# Patient Record
Sex: Female | Born: 1990
Health system: Southern US, Community
[De-identification: ages and names within clinical notes are randomized; demographics above are authoritative.]

## PROBLEM LIST (undated history)

## (undated) DIAGNOSIS — Z8619 Personal history of other infectious and parasitic diseases: Secondary | ICD-10-CM

## (undated) DIAGNOSIS — J302 Other seasonal allergic rhinitis: Secondary | ICD-10-CM

## (undated) DIAGNOSIS — N736 Female pelvic peritoneal adhesions (postinfective): Secondary | ICD-10-CM

## (undated) DIAGNOSIS — N390 Urinary tract infection, site not specified: Secondary | ICD-10-CM

## (undated) DIAGNOSIS — K219 Gastro-esophageal reflux disease without esophagitis: Secondary | ICD-10-CM

## (undated) DIAGNOSIS — N83202 Unspecified ovarian cyst, left side: Secondary | ICD-10-CM

## (undated) DIAGNOSIS — Z720 Tobacco use: Secondary | ICD-10-CM

## (undated) DIAGNOSIS — I2699 Other pulmonary embolism without acute cor pulmonale: Secondary | ICD-10-CM

## (undated) DIAGNOSIS — L309 Dermatitis, unspecified: Secondary | ICD-10-CM

## (undated) DIAGNOSIS — J189 Pneumonia, unspecified organism: Secondary | ICD-10-CM

## (undated) DIAGNOSIS — F1010 Alcohol abuse, uncomplicated: Secondary | ICD-10-CM

## (undated) DIAGNOSIS — J4520 Mild intermittent asthma, uncomplicated: Secondary | ICD-10-CM

## (undated) DIAGNOSIS — E669 Obesity, unspecified: Secondary | ICD-10-CM

## (undated) DIAGNOSIS — A599 Trichomoniasis, unspecified: Secondary | ICD-10-CM

## (undated) DIAGNOSIS — R0600 Dyspnea, unspecified: Secondary | ICD-10-CM

## (undated) HISTORY — PX: NO PAST SURGERIES: SHX2092

## (undated) HISTORY — DX: Obesity, unspecified: E66.9

## (undated) HISTORY — PX: ABDOMINAL HYSTERECTOMY: SHX81

---
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
## 2010-01-07 ENCOUNTER — Emergency Department (HOSPITAL_COMMUNITY): Admission: EM | Admit: 2010-01-07 | Discharge: 2010-01-07 | Payer: Self-pay | Admitting: Family Medicine

## 2010-08-24 ENCOUNTER — Emergency Department (HOSPITAL_COMMUNITY)
Admission: EM | Admit: 2010-08-24 | Discharge: 2010-08-24 | Payer: Self-pay | Source: Home / Self Care | Admitting: Emergency Medicine

## 2011-05-16 ENCOUNTER — Inpatient Hospital Stay (INDEPENDENT_AMBULATORY_CARE_PROVIDER_SITE_OTHER)
Admission: RE | Admit: 2011-05-16 | Discharge: 2011-05-16 | Disposition: A | Discharge status: Home / Self Care | Payer: Self-pay | Source: Ambulatory Visit | Attending: Emergency Medicine | Admitting: Emergency Medicine

## 2011-05-16 DIAGNOSIS — J45909 Unspecified asthma, uncomplicated: Secondary | ICD-10-CM

## 2011-08-05 ENCOUNTER — Encounter: Payer: Self-pay | Admitting: Emergency Medicine

## 2011-08-05 ENCOUNTER — Emergency Department (INDEPENDENT_AMBULATORY_CARE_PROVIDER_SITE_OTHER)
Admission: EM | Admit: 2011-08-05 | Discharge: 2011-08-05 | Disposition: A | Discharge status: Home / Self Care | Payer: Self-pay | Source: Home / Self Care

## 2011-08-05 DIAGNOSIS — J309 Allergic rhinitis, unspecified: Secondary | ICD-10-CM

## 2011-08-05 DIAGNOSIS — J45909 Unspecified asthma, uncomplicated: Secondary | ICD-10-CM

## 2011-08-05 DIAGNOSIS — J302 Other seasonal allergic rhinitis: Secondary | ICD-10-CM

## 2011-08-05 MED ORDER — ALBUTEROL SULFATE HFA 108 (90 BASE) MCG/ACT IN AERS: 1.0000 | INHALATION_SPRAY | Freq: Four times a day (QID) | RESPIRATORY_TRACT | Status: DC | PRN

## 2011-08-05 MED ORDER — FEXOFENADINE-PSEUDOEPHED ER 60-120 MG PO TB12: 1.0000 | ORAL_TABLET | Freq: Two times a day (BID) | ORAL | Status: DC

## 2011-08-05 MED ORDER — ACETAMINOPHEN-CODEINE 120-12 MG/5ML PO SOLN: 5.0000 mL | Freq: Four times a day (QID) | ORAL | Status: AC | PRN

## 2011-08-05 MED ORDER — PREDNISONE 20 MG PO TABS: ORAL_TABLET | ORAL | Status: AC

## 2011-08-05 NOTE — ED Provider Notes (Signed)
History     CSN: [id number] Arrival date & time: 08/05/2011  7:52 PM   First MD Initiated Contact with Patient 08/05/11 1843      Chief Complaint  Patient presents with  . Chest Pain    (Consider location/radiation/quality/duration/timing/severity/associated sxs/prior treatment) Patient is a 20 y.o. female presenting with chest pain. The history is provided by the patient.  Chest Pain Episode onset: h/o asthma had stuffy nose for 2 weeks followed by persistent non productive cough. chest sore when coughing. Primary symptoms include cough and wheezing. Pertinent negatives for primary symptoms include no fever, no shortness of breath, no abdominal pain, no nausea and no vomiting. Primary symptoms comment: increased wheezing using albuterol inhaler 2-3 times a day.     Past Medical History  Diagnosis Date  . Asthma     History reviewed. No pertinent past surgical history.  History reviewed. No pertinent family history.  History  Substance Use Topics  . Smoking status: Current Everyday Smoker  . Smokeless tobacco: Not on file  . Alcohol Use: Yes    OB History    Grav Para Term Preterm Abortions TAB SAB Ect Mult Living                  Review of Systems  Constitutional: Negative.  Negative for fever.  HENT: Positive for congestion, rhinorrhea and sneezing. Negative for sore throat and voice change.   Eyes: Positive for itching. Negative for discharge.  Respiratory: Positive for cough and wheezing. Negative for chest tightness and shortness of breath.   Cardiovascular: Positive for chest pain. Negative for leg swelling.  Gastrointestinal: Negative for nausea, vomiting and abdominal pain.  Musculoskeletal: Negative for myalgias and arthralgias.  Skin: Negative for rash.  Neurological: Negative for headaches.    Allergies  Review of patient's allergies indicates no known allergies.  Home Medications   Current Outpatient Rx  Name Route Sig Dispense Refill  .  ALBUTEROL SULFATE HFA 108 (90 BASE) MCG/ACT IN AERS Inhalation Inhale 2 puffs into the lungs every 6 (six) hours as needed.      . ACETAMINOPHEN-CODEINE 120-12 MG/5ML PO SOLN Oral Take 5 mLs by mouth every 6 (six) hours as needed for pain. 120 mL 0  . ALBUTEROL SULFATE HFA 108 (90 BASE) MCG/ACT IN AERS Inhalation Inhale 1-2 puffs into the lungs every 6 (six) hours as needed for wheezing. 1 Inhaler 0  . FEXOFENADINE-PSEUDOEPHEDRINE 60-120 MG PO TB12 Oral Take 1 tablet by mouth every 12 (twelve) hours. 30 tablet 0  . PREDNISONE 20 MG PO TABS  2 tabs po daily for 5 days 10 tablet no    BP 98/73  Pulse 74  Temp(Src) 98.7 F (37.1 C) (Oral)  Resp 18  SpO2 99%  LMP 07/20/2011  Physical Exam  Nursing note and vitals reviewed. Constitutional: She is oriented to person, place, and time. She appears well-developed and well-nourished. No distress.  HENT:  Head: Normocephalic and atraumatic.  Right Ear: External ear normal.  Left Ear: External ear normal.       Swelling and erythema of nasal turbinates with clear rhinorrhea. Post nasal drip with pharyngeal erythema no exudates.   Eyes: Pupils are equal, round, and reactive to light.       Bilateral eye tearing. No exudative discharge. No blepharitis. Mild conjunctival erythema and cobblestone appearance at inner conjunctiva.    Neck: Normal range of motion.  Cardiovascular: Normal rate, regular rhythm and normal heart sounds.   Pulmonary/Chest: Breath sounds normal. She has  no wheezes. She has no rales.  Abdominal: Soft. There is no tenderness.  Lymphadenopathy:    She has no cervical adenopathy.  Neurological: She is alert and oriented to person, place, and time.  Skin: No rash noted.    ED Course  Procedures (including critical care time)  Labs Reviewed - No data to display No results found.   1. Asthma   2. Seasonal allergic rhinitis       MDM  Treated with prednisone, antihistamine with a decongestant, codeine syrup and  refilled albuterol.        Sharin Grave, MD 08/07/11 1038

## 2011-08-05 NOTE — ED Notes (Signed)
C/o chest pain onset this am .  Reports chest soreness upper chest, center - left.  Pain is worse with movement and coughing.  Non productive cough.  Stuffy nose for 2 weeks.

## 2011-11-28 ENCOUNTER — Emergency Department (HOSPITAL_COMMUNITY)
Admission: EM | Admit: 2011-11-28 | Discharge: 2011-11-29 | Disposition: A | Discharge status: Home / Self Care | Payer: Self-pay | Attending: Emergency Medicine | Admitting: Emergency Medicine

## 2011-11-28 ENCOUNTER — Encounter (HOSPITAL_COMMUNITY): Payer: Self-pay | Admitting: *Deleted

## 2011-11-28 ENCOUNTER — Emergency Department (HOSPITAL_COMMUNITY): Payer: Self-pay

## 2011-11-28 ENCOUNTER — Other Ambulatory Visit: Payer: Self-pay

## 2011-11-28 DIAGNOSIS — R000 Tachycardia, unspecified: Secondary | ICD-10-CM | POA: Insufficient documentation

## 2011-11-28 DIAGNOSIS — J45909 Unspecified asthma, uncomplicated: Secondary | ICD-10-CM | POA: Insufficient documentation

## 2011-11-28 DIAGNOSIS — R079 Chest pain, unspecified: Secondary | ICD-10-CM | POA: Insufficient documentation

## 2011-11-28 DIAGNOSIS — B349 Viral infection, unspecified: Secondary | ICD-10-CM

## 2011-11-28 DIAGNOSIS — B9789 Other viral agents as the cause of diseases classified elsewhere: Secondary | ICD-10-CM | POA: Insufficient documentation

## 2011-11-28 DIAGNOSIS — R0602 Shortness of breath: Secondary | ICD-10-CM | POA: Insufficient documentation

## 2011-11-28 DIAGNOSIS — R509 Fever, unspecified: Secondary | ICD-10-CM | POA: Insufficient documentation

## 2011-11-28 DIAGNOSIS — N390 Urinary tract infection, site not specified: Secondary | ICD-10-CM | POA: Insufficient documentation

## 2011-11-28 LAB — URINE MICROSCOPIC-ADD ON

## 2011-11-28 LAB — URINALYSIS, ROUTINE W REFLEX MICROSCOPIC
Bilirubin Urine: NEGATIVE
Glucose, UA: NEGATIVE mg/dL
Hgb urine dipstick: NEGATIVE
Ketones, ur: NEGATIVE mg/dL
Nitrite: NEGATIVE
Protein, ur: NEGATIVE mg/dL
Specific Gravity, Urine: 1.014 (ref 1.005–1.030)
Urobilinogen, UA: 1 mg/dL (ref 0.0–1.0)
pH: 6.5 (ref 5.0–8.0)

## 2011-11-28 MED ORDER — NITROFURANTOIN MONOHYD MACRO 100 MG PO CAPS: 100.0000 mg | ORAL_CAPSULE | Freq: Two times a day (BID) | ORAL | Status: AC

## 2011-11-28 MED ORDER — ALBUTEROL SULFATE (5 MG/ML) 0.5% IN NEBU
5.0000 mg | INHALATION_SOLUTION | Freq: Once | RESPIRATORY_TRACT | Status: AC
  Administered 2011-11-28: 5 mg via RESPIRATORY_TRACT
  Filled 2011-11-28: qty 1

## 2011-11-28 MED ORDER — PREDNISONE 20 MG PO TABS: 40.0000 mg | ORAL_TABLET | Freq: Every day | ORAL | Status: AC

## 2011-11-28 MED ORDER — PREDNISONE 20 MG PO TABS
60.0000 mg | ORAL_TABLET | Freq: Once | ORAL | Status: AC
  Administered 2011-11-28: 60 mg via ORAL
  Filled 2011-11-28: qty 3

## 2011-11-28 MED ORDER — HYDROCODONE-ACETAMINOPHEN 5-325 MG PO TABS
1.0000 | ORAL_TABLET | Freq: Once | ORAL | Status: AC
  Administered 2011-11-28: 1 via ORAL
  Filled 2011-11-28: qty 1

## 2011-11-28 MED ORDER — ACETAMINOPHEN 325 MG PO TABS
650.0000 mg | ORAL_TABLET | Freq: Once | ORAL | Status: AC
  Administered 2011-11-28: 650 mg via ORAL
  Filled 2011-11-28: qty 2

## 2011-11-28 MED ORDER — ALBUTEROL SULFATE HFA 108 (90 BASE) MCG/ACT IN AERS
2.0000 | INHALATION_SPRAY | RESPIRATORY_TRACT | Status: DC | PRN
  Administered 2011-11-28: 2 via RESPIRATORY_TRACT
  Filled 2011-11-28: qty 6.7

## 2011-11-28 MED ORDER — IBUPROFEN 800 MG PO TABS
800.0000 mg | ORAL_TABLET | Freq: Once | ORAL | Status: AC
  Administered 2011-11-28: 800 mg via ORAL
  Filled 2011-11-28: qty 1

## 2011-11-28 MED ORDER — IPRATROPIUM BROMIDE 0.02 % IN SOLN
0.5000 mg | Freq: Once | RESPIRATORY_TRACT | Status: AC
  Administered 2011-11-28: 0.5 mg via RESPIRATORY_TRACT
  Filled 2011-11-28: qty 2.5

## 2011-11-28 NOTE — ED Notes (Signed)
The pt has ha cold and she has difficulty breathing coughing and she has had a temp.  Hx of asthma

## 2011-11-28 NOTE — ED Provider Notes (Signed)
History     CSN: [id number]  Arrival date & time 11/28/11  4098   First MD Initiated Contact with Patient 11/28/11 2122      Chief Complaint  Patient presents with  . Shortness of Breath    (Consider location/radiation/quality/duration/timing/severity/associated sxs/prior treatment) Patient is a 21 y.o. female presenting with shortness of breath. The history is provided by the patient.  Shortness of Breath  The current episode started today. The problem occurs frequently. The problem has been gradually worsening. The problem is mild. Associated symptoms include a fever, rhinorrhea, cough, shortness of breath and wheezing. Pertinent negatives include no chest pain, no chest pressure and no sore throat. She has had no prior steroid use. There were no sick contacts.  Patient reports 2 day history of URI type  symptoms. States that yesterday she noticed she was having to use her inhaler more often for shortness of breath and wheezing. Patient has also had intermittent fever, occasional cough, body aches and urinary frequency. States that today she also took some homeopathic and reports only minimal relief of shortness of breath with nebs and/or inhaler. Patient admits to history of asthma.  Past Medical History  Diagnosis Date  . Asthma     History reviewed. No pertinent past surgical history.  History reviewed. No pertinent family history.  History  Substance Use Topics  . Smoking status: Current Everyday Smoker  . Smokeless tobacco: Not on file  . Alcohol Use: Yes    OB History    Grav Para Term Preterm Abortions TAB SAB Ect Mult Living                  Review of Systems  Constitutional: Positive for fever.  HENT: Positive for rhinorrhea. Negative for sore throat.   Eyes: Negative.   Respiratory: Positive for cough, shortness of breath and wheezing.   Cardiovascular: Negative.  Negative for chest pain.  Gastrointestinal: Negative.   Genitourinary: Positive for  frequency.  Musculoskeletal: Negative.   Skin: Negative.   Neurological: Negative.   Hematological: Negative.   Psychiatric/Behavioral: Negative.     Allergies  Review of patient's allergies indicates no known allergies.  Home Medications   Current Outpatient Rx  Name Route Sig Dispense Refill  . ALBUTEROL SULFATE HFA 108 (90 BASE) MCG/ACT IN AERS Inhalation Inhale 2 puffs into the lungs every 6 (six) hours as needed. For shortness of breath    . ALBUTEROL SULFATE (2.5 MG/3ML) 0.083% IN NEBU Nebulization Take 2.5 mg by nebulization every 6 (six) hours as needed. For shortness of breath    . IBUPROFEN 200 MG PO TABS Oral Take 400 mg by mouth every 6 (six) hours as needed. For pain.    Marland Kitchen NITROFURANTOIN MONOHYD MACRO 100 MG PO CAPS Oral Take 1 capsule (100 mg total) by mouth 2 (two) times daily. 14 capsule 0  . PREDNISONE 20 MG PO TABS Oral Take 2 tablets (40 mg total) by mouth daily. 10 tablet 0    BP 114/70  Pulse 120  Temp(Src) 99.4 F (37.4 C) (Oral)  Resp 16  SpO2 99%  LMP 11/13/2011  Physical Exam  Constitutional: She is oriented to person, place, and time. She appears well-developed and well-nourished.  HENT:  Head: Normocephalic and atraumatic.  Eyes: Conjunctivae are normal.  Neck: Neck supple.  Cardiovascular: Regular rhythm.  Tachycardia present.   Pulmonary/Chest: Effort normal and breath sounds normal.       Diminished, bil scattered insp/exp wheezes.   Abdominal: Soft. Bowel sounds  are normal.  Musculoskeletal: Normal range of motion.  Neurological: She is alert and oriented to person, place, and time.  Skin: Skin is warm and dry. No erythema.  Psychiatric: She has a normal mood and affect.    ED Course  Procedures  Patient reports breathing much improved after albuterol/Atrovent neb. BBS now CTA. Findings and clinical impression discussed with patient. Will plan to discharge home with treatment for asthmatic bronchitis and UTI and encourage follow up with  primary care physician. Referrals provided. Patient is agreeable with plan.  Labs Reviewed  URINALYSIS, ROUTINE W REFLEX MICROSCOPIC - Abnormal; Notable for the following:    APPearance CLOUDY (*)    Leukocytes, UA TRACE (*)    All other components within normal limits  URINE MICROSCOPIC-ADD ON - Abnormal; Notable for the following:    Squamous Epithelial / LPF FEW (*)    Bacteria, UA FEW (*)    All other components within normal limits   Dg Chest 2 View  11/28/2011  *RADIOLOGY REPORT*  Clinical Data: Chest pain, shortness of breath  CHEST - 2 VIEW  Comparison: 08/24/2010  Findings: Cardiomediastinal silhouette is stable.  No acute infiltrate or pleural effusion.  No pulmonary edema. Mild perihilar increased bronchial markings suspicious for bronchitic changes. Bony thorax is stable.  IMPRESSION: No acute infiltrate or pulmonary edema.  Mild perihilar increased bronchial markings suspicious for bronchitic changes.  Original Report Authenticated By: Natasha Mead, M.D.     1. Asthmatic bronchitis   2. Viral syndrome   3. Urinary tract infection       MDM  HPI/PE and clinical findings course c/w 1. asthmatic bronchitis (CXR supports, no PNA, respiratory symptoms improved with neb. Tachycardia persist but likely due to fever and beta-agonist. Patient is now vomiting. Nontoxic in appearance). 2. UTI (U/A marginal but will treat since patient is symptomatic). 3. Viral syndrome (fever responding to Tylenol and ibuprofen)        Leanne Chang, NP 11/29/11 0005  Kara Kayser Teleah Villamar, NP 11/29/11 0005

## 2011-11-28 NOTE — ED Notes (Signed)
Family in waiting room wanting to go back with patient; informed patient that she can wait in the waiting room with her family (if preferred).

## 2011-11-28 NOTE — Discharge Instructions (Signed)
Please read over the instructions below. You were treated tonight for your persistent cough and viral-type symptoms. Your chest x-ray showed changes consistent with bronchitis but no pneumonia. We are treating you for asthmatic bronchitis. Bronchitis is almost always a viral process likely a result of your recent cold symptoms. Take the prednisone as directed and be sure to complete. Use your inhaler, 1 to 2 inhalations every 4-6 hours as needed for cough, shortness of breath and or wheezing. Your urine also shows that you have a mild urinary tract infection. Take the antibiotic as directed and be sure to finish. Ideally you should have your urine rechecked in a week to 10 days to assure that the infection has cleared. Return for worsening symptoms otherwise arrange followup with her primary care physician. Referrals provided.       Urinary Tract Infection A urinary tract infection (UTI) is often caused by a germ (bacteria). A UTI is usually helped with medicine (antibiotics) that kills germs. Take all the medicine until it is gone. Do this even if you are feeling better. You are usually better in 7 to 10 days. HOME CARE   Drink enough water and fluids to keep your pee (urine) clear or pale yellow. Drink:   Cranberry juice.   Water.   Avoid:   Caffeine.   Tea.   Bubbly (carbonated) drinks.   Alcohol.   Only take medicine as told by your doctor.   To prevent further infections:   Pee often.   After pooping (bowel movement), women should wipe from front to back. Use each tissue only once.   Pee before and after having sex (intercourse).  Ask your doctor when your test results will be ready. Make sure you follow up and get your test results.  GET HELP RIGHT AWAY IF:   There is very bad back pain or lower belly (abdominal) pain.   You get the chills.   You have a fever.   Your baby is older than 3 months with a rectal temperature of 102 F (38.9 C) or higher.   Your baby  is 71 months old or younger with a rectal temperature of 100.4 F (38 C) or higher.   You feel sick to your stomach (nauseous) or throw up (vomit).   There is continued burning with peeing.   Your problems are not better in 3 days. Return sooner if you are getting worse.  MAKE SURE YOU:   Understand these instructions.   Will watch your condition.   Will get help right away if you are not doing well or get worse.  Document Released: 02/18/2008 Document Revised: 08/21/2011 Document Reviewed: 02/18/2008 [facility] Patient Information 2012 [place], [place].Viral Syndrome You or your child has Viral Syndrome. It is the most common infection causing "colds" and infections in the nose, throat, sinuses, and breathing tubes. Sometimes the infection causes nausea, vomiting, or diarrhea. The germ that causes the infection is a virus. No antibiotic or other medicine will kill it. There are medicines that you can take to make you or your child more comfortable.  HOME CARE INSTRUCTIONS   Rest in bed until you start to feel better.   If you have diarrhea or vomiting, eat small amounts of crackers and toast. Soup is helpful.   Do not give aspirin or medicine that contains aspirin to children.   Only take over-the-counter or prescription medicines for pain, discomfort, or fever as directed by your caregiver.  SEEK IMMEDIATE MEDICAL CARE IF:  You or your child has not improved within one week.   You or your child has pain that is not at least partially relieved by over-the-counter medicine.   Thick, colored mucus or blood is coughed up.   Discharge from the nose becomes thick yellow or green.   Diarrhea or vomiting gets worse.   There is any major change in your or your child's condition.   You or your child develops a skin rash, stiff neck, severe headache, or are unable to hold down food or fluid.   You or your child has an oral temperature above 102 F (38.9 C), not controlled by medicine.    Your baby is older than 3 months with a rectal temperature of 102 F (38.9 C) or higher.   Your baby is 31 months old or younger with a rectal temperature of 100.4 F (38 C) or higher.  Document Released: 08/17/2006 Document Revised: 08/21/2011 Document Reviewed: 08/18/2007 [facility] Patient Information 2012 [place], [place].Using Your Inhaler 1. Take the cap off the mouthpiece.  2. Shake the inhaler for 5 seconds.  3. Turn the inhaler so the bottle is above the mouthpiece. Hold it away from your mouth, at a distance of the width of 2 fingers.  4. Open your mouth widely, and tilt your head back slightly. Let your breath out.  5. Take a deep breath in slowly through your mouth. At the same time, push down on the bottle 1 time. You will feel the medicine enter your mouth and throat as you breathe.  6. Continue to take a deep breath in very slowly.  7. After you have breathed in completely, hold your breath for 10 seconds. This will help the medicine to settle in your lungs. If you cannot hold your breath for 10 seconds, hold it for as long as you can before you breathe out.  8. If your doctor has told you to take more than 1 puff, wait at least 1 minute between puffs. This will help you get the best results from your medicine.  9. If you use a steroid inhaler, rinse out your mouth after each dose.  10. Wash your inhaler once a day. Remove the bottle from the mouthpiece. Rinse the mouthpiece and cap with warm water. Dry everything well before you put the inhaler back together.  Document Released: 06/10/2008 Document Revised: 08/21/2011 Document Reviewed: 06/19/2009 [facility] Patient Information 2012 [place], [place].Asthma FAQ Asthma is a serious condition that causes breathing problems. Some severe attacks can cause death. Wear a bracelet or chain that lets others know you have asthma. HOW DID I GET ASTHMA? You might have been born with asthma, or you might be allergic to something that causes an  asthma attack. WHAT CAUSES AN ASTHMA ATTACK? There are many things that can cause an asthma attack. Some of the most common causes are:  Grass, weed, or tree pollen in the air.   Air pollution.   Dust.   Heavy or hard exercise.   Emotional upset.   Infections.   Smoking and secondhand smoke.   Some medicines like aspirin.   Allergies to:   Animals such as cats, dogs, or rabbits.   Certain foods like wheat, rye, nuts, or shellfish.  HOW DO I KEEP FROM HAVING AN ATTACK?  Refill your medicines before they run out.   Always take your medicine like the doctor tells you. This will help prevent an asthma attack.   If you use an inhaler or diskus, carry it  with you at all times.   Stay indoors as much as possible on ozone alert days. This is when the weather is very cold and when the pollen count is high.   Talk to your nurse or doctor about relaxation techniques that might help.   Stop smoking and stay away from smoke.  WHAT HAPPENS DURING AN ASTHMA ATTACK? The airways in your lungs get smaller and puff up (swell). You:  Have a hard time breathing.   Wheeze.   Cough and produce a lot of mucus.  HOW DO I STOP AN ATTACK?  Take medication as directed by your doctor.   If your medicine is not helping, call your local emergency medical services, and go to the emergency room.  Document Released: 06/10/2008 Document Revised: 08/21/2011 Document Reviewed: 06/10/2008 [facility] Patient Information 2012 [place], [place].Asthma, Adult Asthma is caused by narrowing of the air passages in the lungs. It may be triggered by pollen, dust, animal dander, molds, some foods, respiratory infections, exposure to smoke, exercise, emotional stress or other allergens (things that cause allergic reactions or allergies). Repeat attacks are common. HOME CARE INSTRUCTIONS   Use prescription medications as ordered by your caregiver.   Avoid pollen, dust, animal dander, molds, smoke and other  things that cause attacks at home and at work.   You may have fewer attacks if you decrease dust in your home. Electrostatic air cleaners may help.   It may help to replace your pillows or mattress with materials less likely to cause allergies.   Talk to your caregiver about an action plan for managing asthma attacks at home, including, the use of a peak flow meter which measures the severity of your asthma attack. An action plan can help minimize or stop the attack without having to seek medical care.   If you are not on a fluid restriction, drink 8 to 10 glasses of water each day.   Always have a plan prepared for seeking medical attention, including, calling your physician, accessing local emergency care, and calling 911 (in the [place] for a severe attack.   Discuss possible exercise routines with your caregiver.   If animal dander is the cause of asthma, you may need to get rid of pets.  SEEK MEDICAL CARE IF:   You have wheezing and shortness of breath even if taking medicine to prevent attacks.   You have muscle aches, chest pain or thickening of sputum.   Your sputum changes from clear or white to yellow, green, gray, or bloody.   You have any problems that may be related to the medicine you are taking (such as a rash, itching, swelling or trouble breathing).  SEEK IMMEDIATE MEDICAL CARE IF:   Your usual medicines do not stop your wheezing or there is increased coughing and/or shortness of breath.   You have increased difficulty breathing.   You have a fever.  MAKE SURE YOU:   Understand these instructions.   Will watch your condition.   Will get help right away if you are not doing well or get worse.  Document Released: 09/01/2005 Document Revised: 08/21/2011 Document Reviewed: 04/19/2008  RESOURCE GUIDE  Dental Problems  Patients with Medicaid: [facility] [phone] [address]  [address] Phone:  [phone]                                                  Phone:  [phone]  If unable to pay or uninsured, contact:  [facility] or [facility]. to become qualified for the adult dental clinic.  Chronic Pain Problems Contact [facility]  [phone] Patients need to be referred by their primary care doctor.  Insufficient Money for Medicine Contact [employer]:  call "211" or [facility] [phone] 016-0829.  No Primary Care Doctor Call [facility]  [phone] Other agencies that provide inexpensive medical care    [facility]  [phone]    [facility]  [phone]    [facility]  [phone]    [facility]  [phone]    [facility]  [phone]    [facility]  [phone]  Psychological [facility] Behavioral [facility]  [phone] [facility]  [phone] [facility] Mental Health   [phone] (emergency services [phone])  Substance Abuse Resources [facility]  (615) [phone] [facility] 9514224768 [facility] [place] [phone] Floydene Flock [phone] Residential & Outpatient Substance Abuse Program  [phone]  Abuse/Neglect [facility] Child Abuse Hotline 207-555-0850 [facility] Child Abuse Hotline 418-037-1307 (After Hours)  Emergency Shelter [facility] [phone]  Maternity Homes [facility] [place] [facility] [phone] [facility] [phone]  MRSA Hotline #:   (905) 006-5786    [facility] Resources  [facility]     [employer]                          [facility] Dept. [address] [facility]                       [address]      [address]  Blondell Reveal Phone:  [phone]                                    Phone:  [phone]                 Phone:  [phone]  [facility] Mental Health Phone:  [phone]  [facility] Child Abuse Hotline [phone] (After Hours)

## 2011-11-29 NOTE — ED Provider Notes (Signed)
Medical screening examination/treatment/procedure(s) were performed by non-physician practitioner and as supervising physician I was immediately available for consultation/collaboration.    Nelia Shi, MD 11/29/11 1010

## 2012-05-03 ENCOUNTER — Ambulatory Visit: Payer: Self-pay | Admitting: Emergency Medicine

## 2012-05-03 VITALS — BP 122/72 | HR 69 | Temp 98.1°F | Resp 16 | Ht 61.5 in | Wt 232.6 lb

## 2012-05-03 DIAGNOSIS — M6289 Other specified disorders of muscle: Secondary | ICD-10-CM

## 2012-05-03 DIAGNOSIS — R600 Localized edema: Secondary | ICD-10-CM

## 2012-05-03 DIAGNOSIS — E669 Obesity, unspecified: Secondary | ICD-10-CM

## 2012-05-03 DIAGNOSIS — K5900 Constipation, unspecified: Secondary | ICD-10-CM

## 2012-05-03 MED ORDER — POLYETHYLENE GLYCOL 3350 17 GM/SCOOP PO POWD: 17.0000 g | Freq: Two times a day (BID) | ORAL | Status: AC | PRN

## 2012-05-03 NOTE — Progress Notes (Signed)
   Date:  05/03/2012   Name:  Kara Haynes   DOB:  1991-01-19   MRN:  [id number] Gender: female  Age: 21 y.o.  PCP:  No primary provider on file.    Chief Complaint: Leg Swelling, Foot Swelling and Abdominal Pain   History of Present Illness:  Kara Haynes is a 21 y.o. pleasant patient who presents with the following:  Multiple complaints.  Has history of colicky abdominal pain. Mostly right and left upper quadrants.  No associated nausea, vomiting, diarrhea, stool change, blood in stools or black stools.  No fever or chills.  Says moves bowels frequently.  Has early satiety.  No GYN or GU symptoms. 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  No risks for DVT.   Concerned about her weight and we discussed weight loss strategies and risks of HBP, NIDDM, cholesterol and Knee/back and hip disorders.  There is no problem list on file for this patient.   Past Medical History  Diagnosis Date  . Asthma     No past surgical history on file.  History  Substance Use Topics  . Smoking status: Current Everyday Smoker  . Smokeless tobacco: Not on file  . Alcohol Use: Yes    No family history on file.  No Known Allergies  Medication list has been reviewed and updated.  Current Outpatient Prescriptions on File Prior to Visit  Medication Sig Dispense Refill  . albuterol (PROVENTIL HFA;VENTOLIN HFA) 108 (90 BASE) MCG/ACT inhaler Inhale 2 puffs into the lungs every 6 (six) hours as needed. For shortness of breath      . albuterol (PROVENTIL) (2.5 MG/3ML) 0.083% nebulizer solution Take 2.5 mg by nebulization every 6 (six) hours as needed. For shortness of breath      . ibuprofen (ADVIL,MOTRIN) 200 MG tablet Take 400 mg by mouth every 6 (six) hours as needed. For pain.        Review of Systems:  As per HPI, otherwise negative.    Physical Examination: Filed Vitals:   05/03/12 1134  BP: 122/72  Pulse: 69    Temp: 98.1 F (36.7 C)  Resp: 16   Filed Vitals:   05/03/12 1134  Height: 5' 1.5" (1.562 m)  Weight: 232 lb 9.6 oz (105.507 kg)   Body mass index is 43.24 kg/(m^2). Ideal Body Weight: Weight in (lb) to have BMI = 25: 134.2    GEN: WDWN, NAD, Non-toxic, Alert & Oriented x 3 HEENT: Atraumatic, Normocephalic.  Ears and Nose: No external deformity. EXTR: No clubbing/cyanosis/edema NEURO: Normal gait.  PSYCH: Normally interactive. Conversant. Not depressed or anxious appearing.  Calm demeanor.  Abdomen: soft and not tender no masses or megaly  Assessment and Plan: Obesity Dependent edema;  Discussed moving around and use of support hose at work Abdominal pain:  Likely secondary to constipation.  Miralax Follow up as needed  Carmelina Dane, MD

## 2012-10-25 ENCOUNTER — Encounter (HOSPITAL_COMMUNITY): Payer: Self-pay

## 2012-10-25 ENCOUNTER — Emergency Department (INDEPENDENT_AMBULATORY_CARE_PROVIDER_SITE_OTHER)
Admission: EM | Admit: 2012-10-25 | Discharge: 2012-10-25 | Disposition: A | Discharge status: Home / Self Care | Payer: Self-pay | Source: Home / Self Care

## 2012-10-25 DIAGNOSIS — J45909 Unspecified asthma, uncomplicated: Secondary | ICD-10-CM

## 2012-10-25 DIAGNOSIS — J4540 Moderate persistent asthma, uncomplicated: Secondary | ICD-10-CM

## 2012-10-25 DIAGNOSIS — J45901 Unspecified asthma with (acute) exacerbation: Secondary | ICD-10-CM

## 2012-10-25 MED ORDER — IPRATROPIUM BROMIDE 0.02 % IN SOLN
0.5000 mg | Freq: Once | RESPIRATORY_TRACT | Status: AC
  Administered 2012-10-25: 0.5 mg via RESPIRATORY_TRACT

## 2012-10-25 MED ORDER — ALBUTEROL SULFATE (5 MG/ML) 0.5% IN NEBU
INHALATION_SOLUTION | RESPIRATORY_TRACT | Status: AC
  Filled 2012-10-25: qty 1

## 2012-10-25 MED ORDER — ALBUTEROL SULFATE (5 MG/ML) 0.5% IN NEBU
5.0000 mg | INHALATION_SOLUTION | Freq: Once | RESPIRATORY_TRACT | Status: AC
  Administered 2012-10-25: 5 mg via RESPIRATORY_TRACT

## 2012-10-25 MED ORDER — ALBUTEROL SULFATE HFA 108 (90 BASE) MCG/ACT IN AERS: 2.0000 | INHALATION_SPRAY | RESPIRATORY_TRACT | Status: DC | PRN

## 2012-10-25 MED ORDER — TRIAMCINOLONE ACETONIDE 40 MG/ML IJ SUSP
INTRAMUSCULAR | Status: AC
  Filled 2012-10-25: qty 5

## 2012-10-25 MED ORDER — METHYLPREDNISOLONE 4 MG PO KIT: PACK | ORAL | Status: DC

## 2012-10-25 MED ORDER — BECLOMETHASONE DIPROPIONATE 40 MCG/ACT IN AERS: 2.0000 | INHALATION_SPRAY | Freq: Two times a day (BID) | RESPIRATORY_TRACT | Status: DC

## 2012-10-25 MED ORDER — TRIAMCINOLONE ACETONIDE 40 MG/ML IJ SUSP: 40.0000 mg | Freq: Once | INTRAMUSCULAR | Status: DC

## 2012-10-25 NOTE — ED Provider Notes (Signed)
History     CSN: [id number]  Arrival date & time 10/25/12  1015   None     Chief Complaint  Patient presents with  . Asthma    (Consider location/radiation/quality/duration/timing/severity/associated sxs/prior treatment) HPI Comments: 22 year old obese female states that she could not breathe this morning and she is gasping for air. She has a history of asthma and uses her albuterol HFA on a daily basis. She tried to use the inhaler today the she was out of medicine. She states she does not have a physician managing her asthma. Denies chest pain earache, sore throat, fever, chills, abdominal pain, GI symptoms.   Past Medical History  Diagnosis Date  . Asthma     History reviewed. No pertinent past surgical history.  History reviewed. No pertinent family history.  History  Substance Use Topics  . Smoking status: Current Every Day Smoker  . Smokeless tobacco: Not on file  . Alcohol Use: Yes    OB History   Grav Para Term Preterm Abortions TAB SAB Ect Mult Living                  Review of Systems  Constitutional: Positive for activity change. Negative for fever, chills, appetite change and fatigue.  HENT: Negative for congestion, facial swelling, rhinorrhea, neck pain, neck stiffness and postnasal drip.   Eyes: Negative.   Respiratory: Positive for shortness of breath and wheezing. Negative for cough.   Cardiovascular: Negative.   Gastrointestinal: Negative.   Genitourinary: Negative.   Musculoskeletal: Negative.   Skin: Negative for pallor and rash.  Neurological: Negative.     Allergies  Review of patient's allergies indicates no known allergies.  Home Medications   Current Outpatient Rx  Name  Route  Sig  Dispense  Refill  . albuterol (PROVENTIL HFA;VENTOLIN HFA) 108 (90 BASE) MCG/ACT inhaler   Inhalation   Inhale 2 puffs into the lungs every 6 (six) hours as needed. For shortness of breath         . albuterol (PROVENTIL HFA;VENTOLIN HFA) 108 (90  BASE) MCG/ACT inhaler   Inhalation   Inhale 2 puffs into the lungs every 4 (four) hours as needed for wheezing or shortness of breath.   1 Inhaler   1   . albuterol (PROVENTIL) (2.5 MG/3ML) 0.083% nebulizer solution   Nebulization   Take 2.5 mg by nebulization every 6 (six) hours as needed. For shortness of breath         . beclomethasone (QVAR) 40 MCG/ACT inhaler   Inhalation   Inhale 2 puffs into the lungs 2 (two) times daily.   1 Inhaler   1   . ibuprofen (ADVIL,MOTRIN) 200 MG tablet   Oral   Take 400 mg by mouth every 6 (six) hours as needed. For pain.         . methylPREDNISolone (MEDROL DOSEPAK) 4 MG tablet      As directed   21 tablet   0     BP 124/79  Pulse 63  Temp(Src) 98.3 F (36.8 C) (Oral)  Resp 18  SpO2 100%  LMP 09/24/2012  Physical Exam  Nursing note and vitals reviewed. Constitutional: She is oriented to person, place, and time. She appears well-developed and well-nourished. No distress.  HENT:  Mouth/Throat: Oropharynx is clear and moist. No oropharyngeal exudate.  Eyes: Conjunctivae and EOM are normal.  Neck: Normal range of motion. Neck supple.  Cardiovascular: Normal rate, regular rhythm and normal heart sounds.   Pulmonary/Chest: Effort normal. No  respiratory distress. She has wheezes.  There is no evidence of her gasping for her breath. She is sitting on the table in a calm and relaxed posturing. No apparent increased effort in breathing she speaking in full sentences and breathing through her nose. There is bilateral  expiratory wheezing. Slightly diminished expansion and prolonged expiratory phase.  Musculoskeletal: Normal range of motion. She exhibits no edema.  Lymphadenopathy:    She has no cervical adenopathy.  Neurological: She is alert and oriented to person, place, and time.  Skin: Skin is warm and dry. No rash noted.  Psychiatric: She has a normal mood and affect.    ED Course  Procedures (including critical care  time)  Labs Reviewed - No data to display No results found.   1. Intrinsic asthma with exacerbation   2. Asthma, moderate persistent, poorly-controlled       MDM  Patient received a duo neb in the office. Post neb her lungs are clear, no adventitious sounds no wheezing. Chest good expansion inspiratory equals expiratory phase. He states she is feeling better and breathing better She declined to receive her IM Kenalog. Prescriptions include Medrol Dosepak Albuterol HFA Qvar 44 2 puffs twice a day Musculoskeletal pain a PCP to manage her asthma. Recheck promptly for any new symptoms problems or worsening        Hayden Rasmussen, NP 10/25/12 1217

## 2012-10-25 NOTE — ED Provider Notes (Signed)
Medical screening examination/treatment/procedure(s) were performed by non-physician practitioner and as supervising physician I was immediately available for consultation/collaboration.  Leslee Home, M.D.  Reuben Likes, MD 10/25/12 [phone]

## 2013-05-20 ENCOUNTER — Encounter (HOSPITAL_COMMUNITY): Payer: Self-pay | Admitting: Emergency Medicine

## 2013-05-20 ENCOUNTER — Emergency Department (INDEPENDENT_AMBULATORY_CARE_PROVIDER_SITE_OTHER)
Admission: EM | Admit: 2013-05-20 | Discharge: 2013-05-20 | Disposition: A | Discharge status: Home / Self Care | Payer: Self-pay | Source: Home / Self Care

## 2013-05-20 DIAGNOSIS — J4531 Mild persistent asthma with (acute) exacerbation: Secondary | ICD-10-CM

## 2013-05-20 DIAGNOSIS — J9801 Acute bronchospasm: Secondary | ICD-10-CM

## 2013-05-20 DIAGNOSIS — J45901 Unspecified asthma with (acute) exacerbation: Secondary | ICD-10-CM

## 2013-05-20 MED ORDER — TRIAMCINOLONE ACETONIDE 40 MG/ML IJ SUSP
INTRAMUSCULAR | Status: AC
  Filled 2013-05-20: qty 1

## 2013-05-20 MED ORDER — ALBUTEROL SULFATE HFA 108 (90 BASE) MCG/ACT IN AERS: 1.0000 | INHALATION_SPRAY | Freq: Four times a day (QID) | RESPIRATORY_TRACT | Status: DC | PRN

## 2013-05-20 MED ORDER — METHYLPREDNISOLONE (PAK) 4 MG PO TABS: ORAL_TABLET | ORAL | Status: DC

## 2013-05-20 MED ORDER — TRIAMCINOLONE ACETONIDE 40 MG/ML IJ SUSP
40.0000 mg | Freq: Once | INTRAMUSCULAR | Status: AC
  Administered 2013-05-20: 40 mg via INTRAMUSCULAR

## 2013-05-20 MED ORDER — IPRATROPIUM BROMIDE 0.02 % IN SOLN
0.5000 mg | Freq: Once | RESPIRATORY_TRACT | Status: AC
  Administered 2013-05-20: 0.5 mg via RESPIRATORY_TRACT

## 2013-05-20 MED ORDER — ALBUTEROL SULFATE (5 MG/ML) 0.5% IN NEBU
5.0000 mg | INHALATION_SOLUTION | Freq: Once | RESPIRATORY_TRACT | Status: AC
  Administered 2013-05-20: 5 mg via RESPIRATORY_TRACT

## 2013-05-20 MED ORDER — ALBUTEROL SULFATE (5 MG/ML) 0.5% IN NEBU
INHALATION_SOLUTION | RESPIRATORY_TRACT | Status: AC
  Filled 2013-05-20: qty 1

## 2013-05-20 NOTE — ED Provider Notes (Signed)
CSN: [id number]     Arrival date & time 05/20/13  1054 History   First MD Initiated Contact with Patient 05/20/13 1157     Chief Complaint  Patient presents with  . Asthma    wheezing and sob since wednesday. out of medication.    (Consider location/radiation/quality/duration/timing/severity/associated sxs/prior Treatment) HPI Comments: 22 year old obese female with a history of asthma and smoking presents with an asthma exacerbation. She is complaining of wheezing and shortness of breath for the past 3-4 days. It is worse upon exertion. Also associated with cough but no fever. She has no medications are inhalers and she has no PCP. She states whenever she has to have medicine or he gets bad she just comes to the urgent care.   Past Medical History  Diagnosis Date  . Asthma    History reviewed. No pertinent past surgical history. History reviewed. No pertinent family history. History  Substance Use Topics  . Smoking status: Current Every Day Smoker  . Smokeless tobacco: Not on file  . Alcohol Use: Yes   OB History   Grav Para Term Preterm Abortions TAB SAB Ect Mult Living                 Review of Systems  Constitutional: Positive for activity change. Negative for fever.  HENT: Negative.   Respiratory: Positive for cough, shortness of breath and wheezing.   Gastrointestinal: Negative.   Skin: Negative for rash.  Neurological: Negative.     Allergies  Review of patient's allergies indicates no known allergies.  Home Medications   Current Outpatient Rx  Name  Route  Sig  Dispense  Refill  . albuterol (PROVENTIL HFA;VENTOLIN HFA) 108 (90 BASE) MCG/ACT inhaler   Inhalation   Inhale 2 puffs into the lungs every 6 (six) hours as needed. For shortness of breath         . albuterol (PROVENTIL HFA;VENTOLIN HFA) 108 (90 BASE) MCG/ACT inhaler   Inhalation   Inhale 2 puffs into the lungs every 4 (four) hours as needed for wheezing or shortness of breath.   1 Inhaler   1    . albuterol (PROVENTIL HFA;VENTOLIN HFA) 108 (90 BASE) MCG/ACT inhaler   Inhalation   Inhale 1-2 puffs into the lungs every 6 (six) hours as needed for wheezing.   1 Inhaler   0   . albuterol (PROVENTIL) (2.5 MG/3ML) 0.083% nebulizer solution   Nebulization   Take 2.5 mg by nebulization every 6 (six) hours as needed. For shortness of breath         . beclomethasone (QVAR) 40 MCG/ACT inhaler   Inhalation   Inhale 2 puffs into the lungs 2 (two) times daily.   1 Inhaler   1   . ibuprofen (ADVIL,MOTRIN) 200 MG tablet   Oral   Take 400 mg by mouth every 6 (six) hours as needed. For pain.         . methylPREDNISolone (MEDROL DOSEPAK) 4 MG tablet      As directed   21 tablet   0   . methylPREDNIsolone (MEDROL DOSPACK) 4 MG tablet      follow package directions   21 tablet   0    BP 115/99  Pulse 106  Temp(Src) 98.5 F (36.9 C) (Oral)  Resp 36  SpO2 100%  LMP 05/20/2013 Physical Exam  Nursing note and vitals reviewed. Constitutional: She is oriented to person, place, and time. She appears well-developed and well-nourished. No distress.  Eyes: EOM are normal.  Neck: Normal range of motion. Neck supple.  Cardiovascular: Normal rate, regular rhythm and normal heart sounds.   Pulmonary/Chest: She has wheezes. She has no rales.  Mildly increased effort to breathe. Expiratory phase prolonged period with diffuse scattered wheezes bilaterally  Lymphadenopathy:    She has no cervical adenopathy.  Neurological: She is alert and oriented to person, place, and time. She exhibits normal muscle tone.  Skin: Skin is warm and dry.  Psychiatric: She has a normal mood and affect.    ED Course  Procedures (including critical care time) Labs Review Labs Reviewed - No data to display Imaging Review No results found.  MDM   1. Asthma exacerbation attacks, mild persistent   2. Bronchospasm    Moderate improvement to the DuoNeb. Patient states she is breathing better and less  cough. There is improved air movement and a few wheezes. Patient is discharged in stable and improved condition with a prescription for albuterol HFA and Medrol dose pack. She is strongly encouraged to call one of the numbers above to establish with a PCP. Her asthma is not controlled and will need additional medications for enhanced control of her asthma condition and exacerbations. She is also advised to stop smoking.    Hayden Rasmussen, NP 05/20/13 1341

## 2013-05-20 NOTE — ED Notes (Signed)
C/o wheezing and sob since Wednesday. Symptoms are worse with walking. Pt is also out of asthma medication.  Pt has not tried any otc meds for symptoms. Pt is sitting up right no signs of respiratory distress.

## 2013-05-21 NOTE — ED Provider Notes (Signed)
Medical screening examination/treatment/procedure(s) were performed by resident physician or non-physician practitioner and as supervising physician I was immediately available for consultation/collaboration.   Barkley Bruns MD.   Linna Hoff, MD 05/21/13 1106

## 2013-12-04 ENCOUNTER — Emergency Department (HOSPITAL_COMMUNITY)
Admission: EM | Admit: 2013-12-04 | Discharge: 2013-12-04 | Disposition: A | Discharge status: Home / Self Care | Payer: Self-pay | Attending: Emergency Medicine | Admitting: Emergency Medicine

## 2013-12-04 ENCOUNTER — Encounter (HOSPITAL_COMMUNITY): Payer: Self-pay | Admitting: Emergency Medicine

## 2013-12-04 DIAGNOSIS — IMO0002 Reserved for concepts with insufficient information to code with codable children: Secondary | ICD-10-CM | POA: Insufficient documentation

## 2013-12-04 DIAGNOSIS — F17200 Nicotine dependence, unspecified, uncomplicated: Secondary | ICD-10-CM | POA: Insufficient documentation

## 2013-12-04 DIAGNOSIS — J45901 Unspecified asthma with (acute) exacerbation: Secondary | ICD-10-CM | POA: Insufficient documentation

## 2013-12-04 MED ORDER — ALBUTEROL SULFATE HFA 108 (90 BASE) MCG/ACT IN AERS: 2.0000 | INHALATION_SPRAY | RESPIRATORY_TRACT | Status: DC | PRN

## 2013-12-04 MED ORDER — ALBUTEROL SULFATE (2.5 MG/3ML) 0.083% IN NEBU
5.0000 mg | INHALATION_SOLUTION | Freq: Once | RESPIRATORY_TRACT | Status: AC
  Administered 2013-12-04: 5 mg via RESPIRATORY_TRACT
  Filled 2013-12-04: qty 6

## 2013-12-04 MED ORDER — PREDNISONE 20 MG PO TABS: ORAL_TABLET | ORAL | Status: DC

## 2013-12-04 NOTE — Progress Notes (Signed)
Met Patient at bedside.Role of CM explained.Patient reports today's ED visit is secondary to an Increase in Asthma symptoms.Patient states she does not have  A PCP or health Insurance.Eduction provided on Importance of establishing a PCP and medication compliance.Patient provided her verbal consent for this CM  To e-mail the [facility] to make patient a PCP appointment.Best contact number verified with patient.Teach back method used to ensure patient understanding.

## 2013-12-04 NOTE — ED Notes (Signed)
Pt c/o SOB and wheezing since this morning around 0400. Pt has hx of asthma.

## 2013-12-04 NOTE — Discharge Instructions (Signed)
Asthma, Acute Bronchospasm °Acute bronchospasm caused by asthma is also referred to as an asthma attack. Bronchospasm means your air passages become narrowed. The narrowing is caused by inflammation and tightening of the muscles in the air tubes (bronchi) in your lungs. This can make it hard to breath or cause you to wheeze and cough. °CAUSES °Possible triggers are: °· Animal dander from the skin, hair, or feathers of animals. °· Dust mites contained in house dust. °· Cockroaches. °· Pollen from trees or grass. °· Mold. °· Cigarette or tobacco smoke. °· Air pollutants such as dust, household cleaners, hair sprays, aerosol sprays, paint fumes, strong chemicals, or strong odors. °· Cold air or weather changes. Cold air may trigger inflammation. Winds increase molds and pollens in the air. °· Strong emotions such as crying or laughing hard. °· Stress. °· Certain medicines such as aspirin or beta-blockers. °· Sulfites in foods and drinks, such as dried fruits and wine. °· Infections or inflammatory conditions, such as a flu, cold, or inflammation of the nasal membranes (rhinitis). °· Gastroesophageal reflux disease (GERD). GERD is a condition where stomach acid backs up into your throat (esophagus). °· Exercise or strenuous activity. °SIGNS AND SYMPTOMS  °· Wheezing. °· Excessive coughing, particularly at night. °· Chest tightness. °· Shortness of breath. °DIAGNOSIS  °Your health care provider will ask you about your medical history and perform a physical exam. A chest X-ray or blood testing may be performed to look for other causes of your symptoms or other conditions that may have triggered your asthma attack.  °TREATMENT  °Treatment is aimed at reducing inflammation and opening up the airways in your lungs.  Most asthma attacks are treated with inhaled medicines. These include quick relief or rescue medicines (such as bronchodilators) and controller medicines (such as inhaled corticosteroids). These medicines are  sometimes given through an inhaler or a nebulizer. Systemic steroid medicine taken by mouth or given through an IV tube also can be used to reduce the inflammation when an attack is moderate or severe. Antibiotic medicines are only used if a bacterial infection is present.  °HOME CARE INSTRUCTIONS  °· Rest. °· Drink plenty of liquids. This helps the mucus to remain thin and be easily coughed up. Only use caffeine in moderation and do not use alcohol until you have recovered from your illness. °· Do not smoke. Avoid being exposed to secondhand smoke. °· You play a critical role in keeping yourself in good health. Avoid exposure to things that cause you to wheeze or to have breathing problems. °· Keep your medicines up to date and available. Carefully follow your health care provider's treatment plan. °· Take your medicine exactly as prescribed. °· When pollen or pollution is bad, keep windows closed and use an air conditioner or go to places with air conditioning. °· Asthma requires careful medical care. See your health care provider for a follow-up as advised. If you are more than [redacted] weeks pregnant and you were prescribed any new medicines, let your obstetrician know about the visit and how you are doing. Follow-up with your health care provider as directed. °· After you have recovered from your asthma attack, make an appointment with your outpatient doctor to talk about ways to reduce the likelihood of future attacks. If you do not have a doctor who manages your asthma, make an appointment with a primary care doctor to discuss your asthma. °SEEK IMMEDIATE MEDICAL CARE IF:  °· You are getting worse. °· You have trouble breathing. If severe, call   your local emergency services (911 in the U.S.).  You develop chest pain or discomfort.  You are vomiting.  You are not able to keep fluids down.  You are coughing up yellow, green, brown, or bloody sputum.  You have a fever and your symptoms suddenly get  worse.  You have trouble swallowing. MAKE SURE YOU:   Understand these instructions.  Will watch your condition.  Will get help right away if you are not doing well or get worse. Document Released: 12/17/2006 Document Revised: 05/04/2013 Document Reviewed: 03/09/2013 [facility] Patient Information 2014 [place], [place].   Emergency Department Resource Guide 1) Find a Doctor and Pay Out of Pocket Although you won't have to find out who is covered by your insurance plan, it is a good idea to ask around and get recommendations. You will then need to call the office and see if the doctor you have chosen will accept you as a new patient and what types of options they offer for patients who are self-pay. Some doctors offer discounts or will set up payment plans for their patients who do not have insurance, but you will need to ask so you aren't surprised when you get to your appointment.  2) Contact Your Local Health Department Not all health departments have doctors that can see patients for sick visits, but many do, so it is worth a call to see if yours does. If you don't know where your local health department is, you can check in your phone book. The CDC also has a tool to help you locate your state's health department, and many state websites also have listings of all of their local health departments.  3) Find a Walk-in Clinic If your illness is not likely to be very severe or complicated, you may want to try a walk in clinic. These are popping up all over the country in pharmacies, drugstores, and shopping centers. They're usually staffed by nurse practitioners or physician assistants that have been trained to treat common illnesses and complaints. They're usually fairly quick and inexpensive. However, if you have serious medical issues or chronic medical problems, these are probably not your best option.  No Primary Care Doctor: - Call Health Connect at  [phone] - they can help you locate a  primary care doctor that  accepts your insurance, provides certain services, etc. - Physician Referral Service- [phone]  Chronic Pain Problems: Organization         Address  Phone   Notes  [facility]  [phone] Patients need to be referred by their primary care doctor.   Medication Assistance: Organization         Address  Phone   Notes  [facility] Medication [facility] [address] [place] [place], [place] [address] [phone] --Must be a resident of [facility] -- Must have NO insurance coverage whatsoever (no Medicaid/ Medicare, etc.) -- The pt. MUST have a primary care doctor that directs their care regularly and follows them in the community   [facility]  [phone]   [employer]  [phone]    Agencies that provide inexpensive medical care: Organization         Address  Phone   Notes  [facility]  [phone]   [facility]    [phone]   [facility] [address] [place], [place] [address] [phone]   [facility] [address]  [address] [place] [phone]   Planned Parenthood    [phone]   [facility]    [phone]   [facility]  [address], [place] Phone:  [phone], Fax:  [phone] Hours of Operation:  9 am - 6 pm, M-F.  Also accepts Medicaid/Medicare and self-pay.  [facility]  [address], [place] Phone: [phone], Fax: [phone]. Hours of Operation:  8:30 am - 5:30 pm, M-F.  Also accepts Medicaid and self-pay.  [facility] [place] [address], [place] Phone: [phone]   [facility] [address] Natasha Bence [place], [place] [phone], Ext. [phone] Mondays & Thursdays: 7-9 AM.  First 15 patients are seen on a first come, first serve basis.    Medicaid-accepting [facility]  Providers:  Organization         Address  Phone   Notes  [facility] [address],  [phone] Also accepts self-pay patients.  [facility] [address] Laurell Josephs [place], [place]  [phone]   [facility] [address], [place] [phone]   [facility] [address], [place] [phone]   Renaye Rakers [address], [place]   [phone] Only accepts [place] patients after they have their name applied to their card.   Self-Pay (no insurance) in [facility]:  Organization         Address  Phone   Notes  Sickle Cell Patients, [facility] Internal Medicine [address] [place], [place] [phone]   [facility] [address] [place], [place] [phone]   [facility] [place]  [address], [place] [phone]   [facility]/Dr. Osei-Bonsu  [address] [place] or [address], [place] [phone] Phone number for both [place] and [place] locations is the same.  [facility] [address], [place] [phone]   [facility] [address] [place] or [address] [phone]   [facility] [address], [place] [phone], phone; [phone], fax Sees patients 1st and 3rd Saturday of every month.  Must not qualify for public or private insurance (i.e. Medicaid, Medicare, [place] Health Choice, Veterans' Benefits)  Household income should be no more than 200% of the poverty level The clinic cannot treat you if you are pregnant or think you are pregnant  Sexually transmitted diseases are not treated at the clinic.   Dental Care: Organization         Address  Phone  Notes  [facility] [address] [place], [place] [phone] Accepts children up to age 70 who are enrolled in [place] or [place] Health Choice; pregnant women with a Medicaid card; and children who have applied for Medicaid or [place] Health Choice, but were declined, whose parents can pay a reduced fee at time of service.  [facility] Department of [facility]  [address], [place] [phone] Accepts children up to age 48 who are enrolled in [place] or [place] Health Choice; pregnant women with a Medicaid card; and children who have applied for Medicaid or [place]  Health Choice, but were declined, whose parents can pay a reduced fee at time of service.  [facility]  [place] [phone] Patients are seen by appointment only. Walk-ins are not accepted. [place] will see patients 29 years of age and older. Monday - Tuesday (8am-5pm) Most Wednesdays (8:30-5pm) $30 per visit, cash only  [facility] Adult [facility] Access PRO[facility]  [address], [facility] [phone] Patients are seen by appointment only. Walk-ins are not accepted. [place] will see patients 100 years of age and older. One Wednesday Evening (Monthly: Volunteer Based).  $30 per visit, cash only  Young  [phone] for adults; Children under age 70, call Graduate Pediatric Dentistry at [phone]. Children aged 53-14, please call [phone] to request a pediatric application.  Dental services are provided in all areas of dental care including fillings, crowns and bridges, complete and partial dentures, implants, gum treatment, root canals, and extractions. Preventive care is also provided. Treatment is provided to both adults and children. Patients are selected via a lottery and there is often a waiting list.   [facility] [address] [place]  [phone] [email or web address]   [facility]  [address] [place], [place] [phone], Ext. [phone] Second and Fourth Thursday of each month, opens at 6:30 AM; Clinic ends at 9 AM.  Patients are seen on a first-come first-served basis, and a limited number are seen during each clinic.   [facility]  [address] Hillard Danker [place], [place] [phone]   Eligibility Requirements You must have lived in [place], [place], or [place] counties for at least the last three months.   You cannot be eligible for state or federal sponsored [employer], including [employer], [place], or [employer].   You generally cannot be eligible for healthcare insurance through your employer.    How to apply: Eligibility screenings are held every Tuesday and Wednesday afternoon from 1:00 pm until 4:00 pm. You do not need an appointment for the interview!  [facility] [address], [place], [place]   [place]  [place] Department  [place]  [phone]    Behavioral Health Resources in the Community: Intensive Outpatient Programs Organization         Address  Phone  Notes  [facility] [place]. [address] [place], [place] [phone]   [facility] Outpatient [address], [place], [place]   ADS: [facility] Drug [facility] [address], [place], [place]   [place] [address],  [place], [place] or [phone]   Substance Abuse Resources Organization         Address  Phone  Notes  Alcohol [facility] Drug Services  [phone]   [place]  [phone]   [facility] [place]   Chinita Pester  [phone]   Residential & Outpatient Substance Abuse Program  [phone]   Psychological Services Organization         Address  Phone  Notes  [facility] [place]  [place]  [phone]   [place] [address], [place] or [phone]    Mobile Crisis Teams Organization         Address  Phone  Notes  [facility], Mobile Crisis Care Unit  [phone]   [facility]  [address] [place], [place] [phone]   [facility] [address] [place] [phone]    Self-Help/Support Groups Organization         Address  Phone             Notes  [facility] [place] - variety of support groups  [phone]- [phone] Call for more information  Narcotics Anonymous (NA), [facility] [address], [place]  2 meetings at this location   Statisticianesidential Treatment Programs Organization         Address  Phone  Notes  [facility] [address],    [place]  [phone]   [facility]  [address], [place] [phone], [place], [place] [phone]   [facility] [address], [place] [phone] Admissions: 8am-3pm M-F  [facility] [address]    [place], [place] [phone]   [facility] [address], [place], [place] [phone]   [facility] [address]  [place], [place] [phone]   [facility] - Intensive Outpatient [address], [place], [place] [phone]   [facility] [address]  [place], [place] [phone] or [phone]   [facility] ([facility]) [address] [place], [place] [phone] Accepts Medicaid  [facility] [address]  [place] [phone] Substance Abuse/Addiction Treatment   [facility] Behavioral Health Resources Organization         Address  Phone  Notes  [facility]  [phone]   Angie FavaJulie Brannon, PhD [address] [place], [place]   [phone] or [phone]    [facility]   [address] [place], [place] [phone]   [facility] [address], [place], [place] [phone] Insurance/Medicaid/sponsorship through [facility]  [facility] [address] [place] [address]                                    [place], [place] [phone] Therapy/tele-psych/case  [facility] [address]   [place], [place] [phone]    Dr. Lolly MustacheArfeen  [phone]) [phone]   [facility] [place] [place]  [employer] [facility] 1) [address], [place] 2) [address], [place] 3)  [address], [place] [phone]  [phone]   [facility] Child Abuse Hotline [phone] or [phone]) [phone] (After Hours)

## 2013-12-04 NOTE — ED Provider Notes (Signed)
CSN: [id number]     Arrival date & time 12/04/13  4098 History   First MD Initiated Contact with Patient 12/04/13 [phone]     Chief Complaint  Patient presents with  . Asthma     (Consider location/radiation/quality/duration/timing/severity/associated sxs/prior Treatment) HPI 23 year old female with history of asthma was about 12 hours of cough wheezing shortness of breath much better with nebulizer in the emergency department now feels ready for discharge no fever no chest pain no abdominal pain no vomiting no rash no trauma no confusion no other concerns. She ran out of her inhaler. She does not have a primary care doctor in the emergency Department care manager will provide her with resource list to help her find a new doctor. Past Medical History  Diagnosis Date  . Asthma    History reviewed. No pertinent past surgical history. No family history on file. History  Substance Use Topics  . Smoking status: Current Every Day Smoker -- 0.25 packs/day    Types: Cigarettes  . Smokeless tobacco: Not on file  . Alcohol Use: Yes   OB History   Grav Para Term Preterm Abortions TAB SAB Ect Mult Living                 Review of Systems 10 Systems reviewed and are negative for acute change except as noted in the HPI.   Allergies  Review of patient's allergies indicates no known allergies.  Home Medications   Current Outpatient Rx  Name  Route  Sig  Dispense  Refill  . albuterol (PROVENTIL HFA;VENTOLIN HFA) 108 (90 BASE) MCG/ACT inhaler   Inhalation   Inhale 2 puffs into the lungs every 6 (six) hours as needed. For shortness of breath         . albuterol (PROVENTIL) (2.5 MG/3ML) 0.083% nebulizer solution   Nebulization   Take 2.5 mg by nebulization every 6 (six) hours as needed. For shortness of breath         . beclomethasone (QVAR) 40 MCG/ACT inhaler   Inhalation   Inhale 2 puffs into the lungs 2 (two) times daily.         Marland Kitchen albuterol (PROVENTIL HFA;VENTOLIN HFA) 108 (90  BASE) MCG/ACT inhaler   Inhalation   Inhale 2 puffs into the lungs every 2 (two) hours as needed for wheezing or shortness of breath (cough).   1 Inhaler   0   . predniSONE (DELTASONE) 20 MG tablet      3 tabs po day one, then 2 po daily x 4 days   11 tablet   0    BP 119/71  Pulse 93  Temp(Src) 98.3 F (36.8 C) (Oral)  Resp 18  Ht 5\' 1"  (1.549 m)  Wt 200 lb (90.719 kg)  BMI 37.81 kg/m2  SpO2 98%  LMP 11/20/2013 Physical Exam  Nursing note and vitals reviewed. Constitutional:  Awake, alert, nontoxic appearance.  HENT:  Head: Atraumatic.  Eyes: Right eye exhibits no discharge. Left eye exhibits no discharge.  Neck: Neck supple.  Cardiovascular: Normal rate and regular rhythm.   No murmur heard. Pulmonary/Chest: Effort normal. No respiratory distress. She has wheezes. She has no rales. She exhibits no tenderness.  After nebulizer treatments in the emergency department patient has minimal diffuse expiratory wheezes no retractions no accessory muscle usage; speech is normal with pulse oximetry normal on room air at 99%; patient does not want further treatments in the emergency room and she feels ready for discharge  Abdominal: Soft. She exhibits  no distension. There is no tenderness. There is no rebound and no guarding.  Musculoskeletal: She exhibits no tenderness.  Baseline ROM, no obvious new focal weakness.  Neurological:  Mental status and motor strength appears baseline for patient and situation.  Skin: No rash noted.  Psychiatric: She has a normal mood and affect.    ED Course  Procedures (including critical care time) Patient informed of clinical course, understand medical decision-making process, and agree with plan. Labs Review Labs Reviewed - No data to display Imaging Review No results found.   EKG Interpretation None      MDM   Final diagnoses:  Acute asthma exacerbation    I doubt any other EMC precluding discharge at this time including, but  not necessarily limited to the following:SBI.    Hurman HornJohn M Anaika Santillano, MD 12/06/13 [phone]

## 2014-08-09 ENCOUNTER — Emergency Department (HOSPITAL_COMMUNITY): Payer: Self-pay

## 2014-08-09 ENCOUNTER — Encounter (HOSPITAL_COMMUNITY): Payer: Self-pay | Admitting: Emergency Medicine

## 2014-08-09 ENCOUNTER — Inpatient Hospital Stay (HOSPITAL_COMMUNITY)
Admission: EM | Admit: 2014-08-09 | Discharge: 2014-08-12 | DRG: 871 | Disposition: A | Discharge status: Home / Self Care | Payer: Self-pay | Attending: Oncology | Admitting: Oncology

## 2014-08-09 DIAGNOSIS — Z720 Tobacco use: Secondary | ICD-10-CM

## 2014-08-09 DIAGNOSIS — Z86711 Personal history of pulmonary embolism: Secondary | ICD-10-CM

## 2014-08-09 DIAGNOSIS — Z716 Tobacco abuse counseling: Secondary | ICD-10-CM | POA: Diagnosis present

## 2014-08-09 DIAGNOSIS — J189 Pneumonia, unspecified organism: Secondary | ICD-10-CM | POA: Diagnosis present

## 2014-08-09 DIAGNOSIS — R7989 Other specified abnormal findings of blood chemistry: Secondary | ICD-10-CM | POA: Diagnosis present

## 2014-08-09 DIAGNOSIS — I2699 Other pulmonary embolism without acute cor pulmonale: Secondary | ICD-10-CM | POA: Diagnosis present

## 2014-08-09 DIAGNOSIS — R945 Abnormal results of liver function studies: Secondary | ICD-10-CM

## 2014-08-09 DIAGNOSIS — R739 Hyperglycemia, unspecified: Secondary | ICD-10-CM | POA: Diagnosis present

## 2014-08-09 DIAGNOSIS — R748 Abnormal levels of other serum enzymes: Secondary | ICD-10-CM

## 2014-08-09 DIAGNOSIS — J45901 Unspecified asthma with (acute) exacerbation: Secondary | ICD-10-CM

## 2014-08-09 DIAGNOSIS — J45909 Unspecified asthma, uncomplicated: Secondary | ICD-10-CM | POA: Diagnosis present

## 2014-08-09 DIAGNOSIS — F1210 Cannabis abuse, uncomplicated: Secondary | ICD-10-CM

## 2014-08-09 DIAGNOSIS — R0602 Shortness of breath: Secondary | ICD-10-CM | POA: Insufficient documentation

## 2014-08-09 DIAGNOSIS — R6510 Systemic inflammatory response syndrome (SIRS) of non-infectious origin without acute organ dysfunction: Secondary | ICD-10-CM | POA: Diagnosis present

## 2014-08-09 DIAGNOSIS — R9389 Abnormal findings on diagnostic imaging of other specified body structures: Secondary | ICD-10-CM | POA: Diagnosis present

## 2014-08-09 DIAGNOSIS — E876 Hypokalemia: Secondary | ICD-10-CM | POA: Diagnosis present

## 2014-08-09 DIAGNOSIS — E663 Overweight: Secondary | ICD-10-CM | POA: Diagnosis present

## 2014-08-09 DIAGNOSIS — Z6838 Body mass index (BMI) 38.0-38.9, adult: Secondary | ICD-10-CM

## 2014-08-09 DIAGNOSIS — A419 Sepsis, unspecified organism: Principal | ICD-10-CM | POA: Diagnosis present

## 2014-08-09 DIAGNOSIS — F17210 Nicotine dependence, cigarettes, uncomplicated: Secondary | ICD-10-CM | POA: Diagnosis present

## 2014-08-09 HISTORY — DX: Personal history of pulmonary embolism: Z86.711

## 2014-08-09 HISTORY — DX: Unspecified asthma with (acute) exacerbation: J45.901

## 2014-08-09 HISTORY — DX: Urinary tract infection, site not specified: N39.0

## 2014-08-09 HISTORY — DX: Other seasonal allergic rhinitis: J30.2

## 2014-08-09 HISTORY — DX: Tobacco use: Z72.0

## 2014-08-09 HISTORY — DX: Obesity, unspecified: E66.9

## 2014-08-09 LAB — URINE MICROSCOPIC-ADD ON

## 2014-08-09 LAB — HEPATIC FUNCTION PANEL
ALK PHOS: 54 U/L (ref 39–117)
ALT: 36 U/L — AB (ref 0–35)
AST: 37 U/L (ref 0–37)
Albumin: 3.8 g/dL (ref 3.5–5.2)
BILIRUBIN TOTAL: 0.7 mg/dL (ref 0.3–1.2)
Total Protein: 7.5 g/dL (ref 6.0–8.3)

## 2014-08-09 LAB — CBC
HEMATOCRIT: 38.3 % (ref 36.0–46.0)
HEMOGLOBIN: 12.7 g/dL (ref 12.0–15.0)
MCH: 28.2 pg (ref 26.0–34.0)
MCHC: 33.2 g/dL (ref 30.0–36.0)
MCV: 85.1 fL (ref 78.0–100.0)
Platelets: 260 10*3/uL (ref 150–400)
RBC: 4.5 MIL/uL (ref 3.87–5.11)
RDW: 13.6 % (ref 11.5–15.5)
WBC: 9.1 10*3/uL (ref 4.0–10.5)

## 2014-08-09 LAB — I-STAT ARTERIAL BLOOD GAS, ED
Acid-base deficit: 10 mmol/L — ABNORMAL HIGH (ref 0.0–2.0)
Bicarbonate: 15.4 mEq/L — ABNORMAL LOW (ref 20.0–24.0)
O2 SAT: 67 %
PCO2 ART: 32.2 mmHg — AB (ref 35.0–45.0)
PH ART: 7.287 — AB (ref 7.350–7.450)
Patient temperature: 98.8
TCO2: 16 mmol/L (ref 0–100)
pO2, Arterial: 39 mmHg — CL (ref 80.0–100.0)

## 2014-08-09 LAB — URINALYSIS, ROUTINE W REFLEX MICROSCOPIC
BILIRUBIN URINE: NEGATIVE
Glucose, UA: NEGATIVE mg/dL
Ketones, ur: NEGATIVE mg/dL
LEUKOCYTES UA: NEGATIVE
Nitrite: NEGATIVE
PROTEIN: NEGATIVE mg/dL
Specific Gravity, Urine: 1.003 — ABNORMAL LOW (ref 1.005–1.030)
Urobilinogen, UA: 0.2 mg/dL (ref 0.0–1.0)
pH: 5.5 (ref 5.0–8.0)

## 2014-08-09 LAB — I-STAT TROPONIN, ED: Troponin i, poc: 0 ng/mL (ref 0.00–0.08)

## 2014-08-09 LAB — I-STAT VENOUS BLOOD GAS, ED
Acid-base deficit: 6 mmol/L — ABNORMAL HIGH (ref 0.0–2.0)
Bicarbonate: 17.6 mEq/L — ABNORMAL LOW (ref 20.0–24.0)
O2 Saturation: 92 %
TCO2: 18 mmol/L (ref 0–100)
pCO2, Ven: 29.7 mmHg — ABNORMAL LOW (ref 45.0–50.0)
pH, Ven: 7.381 — ABNORMAL HIGH (ref 7.250–7.300)
pO2, Ven: 65 mmHg — ABNORMAL HIGH (ref 30.0–45.0)

## 2014-08-09 LAB — TROPONIN I

## 2014-08-09 LAB — PROTIME-INR
INR: 1.08 (ref 0.00–1.49)
Prothrombin Time: 14.2 seconds (ref 11.6–15.2)

## 2014-08-09 LAB — BASIC METABOLIC PANEL
ANION GAP: 17 — AB (ref 5–15)
BUN: 7 mg/dL (ref 6–23)
CHLORIDE: 99 meq/L (ref 96–112)
CO2: 20 meq/L (ref 19–32)
Calcium: 8.8 mg/dL (ref 8.4–10.5)
Creatinine, Ser: 0.83 mg/dL (ref 0.50–1.10)
GFR calc Af Amer: >90 mL/min (ref >90)
GFR calc non Af Amer: >90 mL/min (ref >90)
Glucose, Bld: 129 mg/dL — ABNORMAL HIGH (ref 70–99)
Potassium: 2.9 mEq/L — CL (ref 3.7–5.3)
SODIUM: 136 meq/L — AB (ref 137–147)

## 2014-08-09 LAB — MAGNESIUM: MAGNESIUM: 1.8 mg/dL (ref 1.5–2.5)

## 2014-08-09 LAB — LIPASE, BLOOD: Lipase: 12 U/L (ref 11–59)

## 2014-08-09 LAB — POC URINE PREG, ED: PREG TEST UR: NEGATIVE

## 2014-08-09 LAB — ANTITHROMBIN III: AntiThromb III Func: 102 % (ref 75–120)

## 2014-08-09 LAB — I-STAT CG4 LACTIC ACID, ED: Lactic Acid, Venous: 6.46 mmol/L — ABNORMAL HIGH (ref 0.5–2.2)

## 2014-08-09 MED ORDER — METHYLPREDNISOLONE SODIUM SUCC 125 MG IJ SOLR
125.0000 mg | Freq: Once | INTRAMUSCULAR | Status: AC
  Administered 2014-08-09: 125 mg via INTRAVENOUS

## 2014-08-09 MED ORDER — MAGNESIUM SULFATE 2 GM/50ML IV SOLN
2.0000 g | INTRAVENOUS | Status: AC
  Administered 2014-08-09: 2 g via INTRAVENOUS
  Filled 2014-08-09: qty 50

## 2014-08-09 MED ORDER — DEXTROSE 5 % IV SOLN
500.0000 mg | Freq: Once | INTRAVENOUS | Status: AC
  Administered 2014-08-09: 500 mg via INTRAVENOUS
  Filled 2014-08-09: qty 500

## 2014-08-09 MED ORDER — HEPARIN (PORCINE) IN NACL 100-0.45 UNIT/ML-% IJ SOLN
1300.0000 [IU]/h | INTRAMUSCULAR | Status: DC
  Administered 2014-08-09 (×2): 1200 [IU]/h via INTRAVENOUS
  Filled 2014-08-09: qty 250

## 2014-08-09 MED ORDER — IOHEXOL 350 MG/ML SOLN
80.0000 mL | Freq: Once | INTRAVENOUS | Status: AC | PRN
  Administered 2014-08-09: 80 mL via INTRAVENOUS

## 2014-08-09 MED ORDER — MAGNESIUM SULFATE 2 GM/50ML IV SOLN: 2.0000 g | INTRAVENOUS | Status: DC

## 2014-08-09 MED ORDER — METHYLPREDNISOLONE SODIUM SUCC 125 MG IJ SOLR
125.0000 mg | Freq: Once | INTRAMUSCULAR | Status: DC
  Filled 2014-08-09: qty 2

## 2014-08-09 MED ORDER — SODIUM CHLORIDE 0.9 % IV SOLN
INTRAVENOUS | Status: AC
  Administered 2014-08-09: 23:00:00 via INTRAVENOUS
  Administered 2014-08-10: 1000 mL via INTRAVENOUS

## 2014-08-09 MED ORDER — DEXTROSE 5 % IV SOLN
1.0000 g | INTRAVENOUS | Status: DC
  Administered 2014-08-10: 1 g via INTRAVENOUS
  Filled 2014-08-09 (×2): qty 10

## 2014-08-09 MED ORDER — IPRATROPIUM BROMIDE 0.02 % IN SOLN
0.5000 mg | Freq: Once | RESPIRATORY_TRACT | Status: AC
  Administered 2014-08-09: 0.5 mg via RESPIRATORY_TRACT
  Filled 2014-08-09: qty 2.5

## 2014-08-09 MED ORDER — METHYLPREDNISOLONE SODIUM SUCC 125 MG IJ SOLR
40.0000 mg | Freq: Once | INTRAMUSCULAR | Status: DC
  Filled 2014-08-09: qty 2

## 2014-08-09 MED ORDER — HEPARIN BOLUS VIA INFUSION
4000.0000 [IU] | Freq: Once | INTRAVENOUS | Status: AC
  Administered 2014-08-09: 4000 [IU] via INTRAVENOUS
  Filled 2014-08-09: qty 4000

## 2014-08-09 MED ORDER — POTASSIUM CHLORIDE CRYS ER 20 MEQ PO TBCR
80.0000 meq | EXTENDED_RELEASE_TABLET | Freq: Once | ORAL | Status: AC
  Administered 2014-08-09: 80 meq via ORAL
  Filled 2014-08-09: qty 4

## 2014-08-09 MED ORDER — HYDROCODONE-HOMATROPINE 5-1.5 MG/5ML PO SYRP
5.0000 mL | ORAL_SOLUTION | Freq: Once | ORAL | Status: AC
Start: 2014-08-09 — End: 2014-08-09
  Administered 2014-08-09: 5 mL via ORAL
  Filled 2014-08-09: qty 5

## 2014-08-09 MED ORDER — SODIUM CHLORIDE 0.9 % IV BOLUS (SEPSIS): 1000.0000 mL | Freq: Once | INTRAVENOUS | Status: DC

## 2014-08-09 MED ORDER — ALBUTEROL (5 MG/ML) CONTINUOUS INHALATION SOLN
20.0000 mg/h | INHALATION_SOLUTION | RESPIRATORY_TRACT | Status: AC
  Administered 2014-08-09: 20 mg/h via RESPIRATORY_TRACT
  Filled 2014-08-09 (×2): qty 20

## 2014-08-09 MED ORDER — SODIUM CHLORIDE 0.9 % IV BOLUS (SEPSIS)
1000.0000 mL | INTRAVENOUS | Status: AC
  Administered 2014-08-09 (×2): 1000 mL via INTRAVENOUS

## 2014-08-09 MED ORDER — ALBUTEROL (5 MG/ML) CONTINUOUS INHALATION SOLN
20.0000 mg/h | INHALATION_SOLUTION | RESPIRATORY_TRACT | Status: AC
  Administered 2014-08-09: 20 mg/h via RESPIRATORY_TRACT

## 2014-08-09 MED ORDER — DEXTROSE 5 % IV SOLN
1.0000 g | Freq: Once | INTRAVENOUS | Status: AC
  Administered 2014-08-09: 1 g via INTRAVENOUS
  Filled 2014-08-09: qty 10

## 2014-08-09 MED ORDER — IPRATROPIUM-ALBUTEROL 0.5-2.5 (3) MG/3ML IN SOLN
3.0000 mL | Freq: Once | RESPIRATORY_TRACT | Status: AC
  Administered 2014-08-09: 3 mL via RESPIRATORY_TRACT
  Filled 2014-08-09: qty 3

## 2014-08-09 MED ORDER — ACETAMINOPHEN 325 MG PO TABS
325.0000 mg | ORAL_TABLET | Freq: Once | ORAL | Status: AC
  Administered 2014-08-09: 325 mg via ORAL
  Filled 2014-08-09: qty 1

## 2014-08-09 MED ORDER — DEXTROSE 5 % IV SOLN
500.0000 mg | INTRAVENOUS | Status: DC
  Administered 2014-08-10: 500 mg via INTRAVENOUS
  Filled 2014-08-09 (×2): qty 500

## 2014-08-09 MED ORDER — IPRATROPIUM BROMIDE 0.02 % IN SOLN
RESPIRATORY_TRACT | Status: AC
  Filled 2014-08-09: qty 2.5

## 2014-08-09 NOTE — ED Notes (Signed)
Pt monitored by pulse ox, bp cuff, and 12-lead. 

## 2014-08-09 NOTE — ED Notes (Signed)
Pt with hx asthma. Has had cough since yesterday. This morning she started having midsternal cp and sob. She took home neb tx without relief.

## 2014-08-09 NOTE — ED Notes (Signed)
Elevated CG-4 reported to ChileJenn Piperbrink-PA

## 2014-08-09 NOTE — ED Notes (Signed)
Pt ambulated to bathroom. Steady gait. PT denies dizziness or lightheadedness.

## 2014-08-09 NOTE — ED Provider Notes (Signed)
CSN: [id number]     Arrival date & time 08/09/14  1440 History   First MD Initiated Contact with Patient 08/09/14 1518     Chief Complaint  Patient presents with  . Asthma     (Consider location/radiation/quality/duration/timing/severity/associated sxs/prior Treatment) HPI Comments: Patient is a 23 yo F PMHx significant for asthma, tobacco abuse presenting to the emergency department for 2 day history of subjective fever, chills, cough, nasal congestion, rhinorrhea. This morning she developed wheezing, shortness of breath, post tussive chest tightness. She has tried her at home rescue inhaler 6-7 times a day with little to no improvement. No sick contacts. No antipyretics home. She denies any nausea, vomiting, diarrhea, abdominal pain, leg swelling, recent travel, recent surgeries, recent immobilizations. No history of prior PEs or DVTs.  Patient is a 23 y.o. female presenting with asthma.  Asthma Associated symptoms include congestion, coughing and a fever.    Past Medical History  Diagnosis Date  . Asthma    History reviewed. No pertinent past surgical history. No family history on file. History  Substance Use Topics  . Smoking status: Current Every Day Smoker -- 0.25 packs/day    Types: Cigarettes  . Smokeless tobacco: Not on file  . Alcohol Use: Yes   OB History    No data available     Review of Systems  Constitutional: Positive for fever.  HENT: Positive for congestion and rhinorrhea.   Respiratory: Positive for cough, chest tightness, shortness of breath and wheezing.   All other systems reviewed and are negative.     Allergies  Review of patient's allergies indicates no known allergies.  Home Medications   Prior to Admission medications   Medication Sig Start Date End Date Taking? Authorizing Provider  albuterol (PROVENTIL HFA;VENTOLIN HFA) 108 (90 BASE) MCG/ACT inhaler Inhale 2 puffs into the lungs every 2 (two) hours as needed for wheezing or shortness of  breath (cough). 12/04/13  Yes Hurman HornJohn M Bednar, MD  predniSONE (DELTASONE) 20 MG tablet 3 tabs po day one, then 2 po daily x 4 days Patient not taking: Reported on 08/09/2014 12/04/13   Hurman HornJohn M Bednar, MD   BP 111/85 mmHg  Pulse 121  Temp(Src) 99.4 F (37.4 C) (Oral)  Resp 27  SpO2 95%  LMP 07/26/2014 (Approximate) Physical Exam  Constitutional: She is oriented to person, place, and time. She appears well-developed and well-nourished. No distress.  HENT:  Head: Normocephalic and atraumatic.  Right Ear: External ear normal.  Left Ear: External ear normal.  Nose: Rhinorrhea present.  Mouth/Throat: Uvula is midline, oropharynx is clear and moist and mucous membranes are normal.  Eyes: Conjunctivae are normal.  Neck: Neck supple.  Cardiovascular: Regular rhythm and normal heart sounds.  Tachycardia present.   Pulmonary/Chest: Accessory muscle usage present. Tachypnea noted. She has no decreased breath sounds. She has wheezes (inspiratory and expiratory diffuse). She exhibits tenderness.  Abdominal: Soft. Bowel sounds are normal. There is no tenderness.  Musculoskeletal: Normal range of motion.  Neurological: She is alert and oriented to person, place, and time.  Skin: Skin is warm and dry. She is not diaphoretic.  Nursing note and vitals reviewed.   ED Course  Procedures (including critical care time) Medications  albuterol (PROVENTIL,VENTOLIN) solution continuous neb (0 mg/hr Nebulization Stopped 08/09/14 2122)  albuterol (PROVENTIL,VENTOLIN) solution continuous neb (0 mg/hr Nebulization Stopped 08/09/14 2122)  sodium chloride 0.9 % bolus 1,000 mL (1,000 mLs Intravenous New Bag/Given 08/09/14 2144)  cefTRIAXone (ROCEPHIN) 1 g in dextrose 5 % 50  mL IVPB (not administered)  azithromycin (ZITHROMAX) 500 mg in dextrose 5 % 250 mL IVPB (500 mg Intravenous New Bag/Given 08/09/14 2151)  azithromycin (ZITHROMAX) 500 mg in dextrose 5 % 250 mL IVPB (not administered)  cefTRIAXone (ROCEPHIN) 1 g  in dextrose 5 % 50 mL IVPB (not administered)  heparin bolus via infusion 4,000 Units (not administered)  heparin ADULT infusion 100 units/mL (25000 units/250 mL) (1,200 Units/hr Intravenous New Bag/Given 08/09/14 2205)  ipratropium-albuterol (DUONEB) 0.5-2.5 (3) MG/3ML nebulizer solution 3 mL (3 mLs Nebulization Given 08/09/14 1457)  ipratropium (ATROVENT) nebulizer solution 0.5 mg (0.5 mg Nebulization Given 08/09/14 1610)  HYDROcodone-homatropine (HYCODAN) 5-1.5 MG/5ML syrup 5 mL (5 mLs Oral Given 08/09/14 1649)  acetaminophen (TYLENOL) tablet 325 mg (325 mg Oral Given 08/09/14 1611)  methylPREDNISolone sodium succinate (SOLU-MEDROL) 125 mg/2 mL injection 125 mg (125 mg Intravenous Given 08/09/14 1657)  potassium chloride SA (K-DUR,KLOR-CON) CR tablet 80 mEq (80 mEq Oral Given 08/09/14 1854)  magnesium sulfate IVPB 2 g 50 mL (0 g Intravenous Stopped 08/09/14 2132)  iohexol (OMNIPAQUE) 350 MG/ML injection 80 mL (80 mLs Intravenous Contrast Given 08/09/14 2112)    Labs Review Labs Reviewed  BASIC METABOLIC PANEL - Abnormal; Notable for the following:    Sodium 136 (*)    Potassium 2.9 (*)    Glucose, Bld 129 (*)    Anion gap 17 (*)    All other components within normal limits  HEPATIC FUNCTION PANEL - Abnormal; Notable for the following:    ALT 36 (*)    All other components within normal limits  URINALYSIS, ROUTINE W REFLEX MICROSCOPIC - Abnormal; Notable for the following:    Specific Gravity, Urine 1.003 (*)    Hgb urine dipstick TRACE (*)    All other components within normal limits  I-STAT CG4 LACTIC ACID, ED - Abnormal; Notable for the following:    Lactic Acid, Venous 6.46 (*)    All other components within normal limits  I-STAT VENOUS BLOOD GAS, ED - Abnormal; Notable for the following:    pH, Ven 7.381 (*)    pCO2, Ven 29.7 (*)    pO2, Ven 65.0 (*)    Bicarbonate 17.6 (*)    Acid-base deficit 6.0 (*)    All other components within normal limits  CULTURE, BLOOD (ROUTINE  X 2)  CULTURE, BLOOD (ROUTINE X 2)  URINE CULTURE  RESPIRATORY VIRUS PANEL  CBC  TROPONIN I  LIPASE, BLOOD  PROTIME-INR  URINE MICROSCOPIC-ADD ON  DIFFERENTIAL  BLOOD GAS, VENOUS  HEPARIN LEVEL (UNFRACTIONATED)  CBC  I-STAT TROPOININ, ED  POC URINE PREG, ED    Imaging Review Dg Chest 2 View (if Patient Has Fever And/or Copd)  08/09/2014   CLINICAL DATA:  Acute chest pain and shortness of breath  EXAM: CHEST  2 VIEW  COMPARISON:  11/28/2011  FINDINGS: Normal heart size and vascularity. Minor streaky basilar atelectasis. No definite pneumonia, collapse or consolidation. Negative for edema, effusion or pneumothorax. Trachea midline. No acute osseous finding.  IMPRESSION: Minor bibasilar atelectasis.  No acute chest process.   Electronically Signed   By: Ruel Favorsrevor  Shick M.D.   On: 08/09/2014 16:32   Ct Angio Chest Pe W/cm &/or Wo Cm  08/09/2014   CLINICAL DATA:  One day history of chest pain and difficulty breathing  EXAM: CT ANGIOGRAPHY CHEST WITH CONTRAST  TECHNIQUE: Multidetector CT imaging of the chest was performed using the standard protocol during bolus administration of intravenous contrast. Multiplanar CT image reconstructions and MIPs were  obtained to evaluate the vascular anatomy.  CONTRAST:  80mL OMNIPAQUE IOHEXOL 350 MG/ML SOLN  COMPARISON:  Chest radiograph August 09, 2014  FINDINGS: There are several small subsegmental pulmonary emboli in left lower lobe pulmonary artery branches. No larger pulmonary emboli are identified. There is no evidence of right heart strain.  There is no thoracic aortic aneurysm or dissection.  There is patchy infiltrate in the left lower lobe, primarily in the medial segment region. There is also patchy infiltrate in the medial aspect of the lateral segment of the right middle lobe. There is more subtle patchy infiltrate in the posterior segment of the left upper lobe. There is a nodular opacity in the posterior segment of the left upper lobe measuring 1.1  x 0.9 cm. On axial slice 42 series 506, a second nodular opacity is noted in the periphery of the superior segment of the left lower lobe measuring 0.5 x 0.5 cm.  There is no appreciable thoracic adenopathy. Thymic tissue is present, normal for age. Pericardium is not thickened.  In the visualized upper abdomen there is hepatic steatosis. There are no blastic or lytic bone lesions. Visualized thyroid appears normal.  Review of the MIP images confirms the above findings.  IMPRESSION: Several small peripheral pulmonary emboli in the left lower lobe without right heart strain. Areas of infiltrate bilaterally. Nodular opacities, largest measuring 1.1 x 0.9 cm in the left upper lobe. Given this finding, a followup study in 3 months advised to further assess. No appreciable adenopathy. Hepatic steatosis present.  Critical Value/emergent results were called by telephone at the time of interpretation on 08/09/2014 at 9:36 pm to [facility], PAC , who verbally acknowledged these results.   Electronically Signed   By: Bretta Bang M.D.   On: 08/09/2014 21:37     EKG Interpretation None      8:55 PM On re-evaluation, patient resting comfortably with improvement in accessory muscle use and tachypnea remains tachycardic. Endorses her breathing remains better than arrival.   CRITICAL CARE Performed by: Francee Piccolo L   Total critical care time: 60 minutes  Critical care time was exclusive of separately billable procedures and treating other patients.  Critical care was necessary to treat or prevent imminent or life-threatening deterioration.  Critical care was time spent personally by me on the following activities: development of treatment plan with patient and/or surrogate as well as nursing, discussions with consultants, evaluation of patient's response to treatment, examination of patient, obtaining history from patient or surrogate, ordering and performing treatments and  interventions, ordering and review of laboratory studies, ordering and review of radiographic studies, pulse oximetry and re-evaluation of patient's condition.  MDM   Final diagnoses:  Shortness of breath  Pulmonary embolism  CAP (community acquired pneumonia)    Filed Vitals:   08/09/14 2033  BP:   Pulse:   Temp: 99.4 F (37.4 C)  Resp:    Afebrile,  non-toxic appearing, AAOx4. I have reviewed nursing notes, vital signs, and all appropriate lab and imaging results for this patient. Patient's symptoms likely secondary to PE and CAP. Will start Heparin. Rocephin and Azithromycin will be given for CAP coverage. Patient will be admitted to Internal Medicine Teaching Service for further medical management. Patient d/w with Dr. Manus Gunning, agrees with plan.          Jeannetta Ellis, PA-C 08/10/14 0025  Glynn Octave, MD 08/10/14 [phone]

## 2014-08-09 NOTE — H&P (Signed)
Date: 08/09/2014               Patient Name:  Kara EatonShanequia J Wright-Madkins MRN: [id number]  DOB: [birth year] Age / Sex: 23 y.o., female   PCP: No Pcp Per Patient         Medical Service: Internal Medicine Teaching Service         Attending Physician: Dr. Cephas DarbyJames Granfortuna    First Contact: Dr. Gara Kroneriana Truong Pager: [phone]  Second Contact: Dr. Inocente SallesEjiro Emokpae Pager: [phone]       After Hours (After 5p/  First Contact Pager: [phone]  weekends / holidays): Second Contact Pager: [phone]   Chief Complaint: fever and cold symptoms for 2 days and shortness of breath this morning  History of Present Illness: Ms. Kara SpenceShanequia Wright-Madkins is a 23 yo woman with a history of asthma who is presenting with a recent fever and cold with acute shortness of breath and chest pain early this morning. She noticed a cough productive of yellow sputum several days ago along with fever after spending time with a sick contact. Then this morning, she had acute shortness of breath upon sitting up in bed. She tried walking to her closet, and again was short of breath. She used her albuterol inhaler, then had the sudden onset of 10/10, sharp, pleuritic chest pain substernally. The pain did not move and subsided once she was in the ED and received an albuterol nebulizer.   With regard to her asthma history, she typically has 1-2 exacerbations per year, often triggered by pollen or weather changes. She uses an albuterol inhaler at home but did not fill her beclomethasone prescription due to its expense. She has never been intubated.  In the ED, she was not improving with breathing treatments, so a CTA was performed. It was significant for several small peripheral pulmonary emboli in the LLL and areas of infiltrate bilaterally. Of note, she is a current cigarette smoker with a 1 pack year history of smoking. 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   Home Meds: Albuterol 108 (90 base) mcg/act inhaler 2 puffs q2 hours as needed  Allergies: Allergies as of 08/09/2014  . (No Known Allergies)   Past Medical History  Diagnosis Date  . Asthma    History reviewed. No pertinent past surgical history. No family history on file. History   Social History  . Marital Status: Married    Spouse Name: N/A    Number of Children: N/A  . Years of Education: N/A   Occupational History  . Not on file.   Social History Main Topics  . Smoking status: Current Every Day Smoker -- 0.25 packs/day    Types: Cigarettes  . Smokeless tobacco: Not on file  . Alcohol Use: Yes  . Drug Use: Yes    Special: Marijuana  . Sexual Activity: Yes    Birth Control/ Protection: None   Other Topics Concern  . Not on file   Social History Narrative    Review of Systems: General: recent URI Skin: no rashes or lesions HEENT: no headaches, no changes in vision Cardiac: + chest pain earlier today Respiratory: + shortness of breath, + productive cough, asthma GI: no changes in BMs or abdominal pain Urinary: no changes in urination Msk: no calf pain, no swelling or redness in BLE  Physical Exam:  Blood pressure 111/85, pulse 121, temperature 99.4 F (37.4 C), temperature source Oral, resp. rate 27, last menstrual period 07/26/2014, SpO2 95 %. Appearance: obese young woman in NAD, lying in bed with girlfriend at bedside HEENT: AT/Branch, PERRL, EOMi Heart: RRR, normal S1S2, no pain to palpation Lungs: very faint inspiratory and expiratory wheezing throughout Abdomen: obese, soft, nontender Musculoskeletal: symmetric, non-edematous lower extremities with no tenderness to palpation Neurologic: A&Ox3, grossly intact Skin: no lesions, erythema or rashes  Lab results: Basic Metabolic Panel:  Recent Labs  65/78/46 1736  NA 136*  K 2.9*  CL 99  CO2 20  GLUCOSE 129*   BUN 7  CREATININE 0.83  CALCIUM 8.8   Liver Function Tests:  Recent Labs  08/09/14 1937  AST 37  ALT 36*  ALKPHOS 54  BILITOT 0.7  PROT 7.5  ALBUMIN 3.8    Recent Labs  08/09/14 1937  LIPASE 12   CBC:  Recent Labs  08/09/14 1736  WBC 9.1  HGB 12.7  HCT 38.3  MCV 85.1  PLT 260   Cardiac Enzymes:  Recent Labs  08/09/14 1937  TROPONINI <0.30   Coagulation:  Recent Labs  08/09/14 1937  LABPROT 14.2  INR 1.08   UDS: + for THC  Urinalysis:  Recent Labs  08/09/14 2035  COLORURINE YELLOW  LABSPEC 1.003*  PHURINE 5.5  GLUCOSEU NEGATIVE  HGBUR TRACE*  BILIRUBINUR NEGATIVE  KETONESUR NEGATIVE  PROTEINUR NEGATIVE  UROBILINOGEN 0.2  NITRITE NEGATIVE  LEUKOCYTESUR NEGATIVE    Imaging results:  Dg Chest 2 View (if Patient Has Fever And/or Copd)  08/09/2014   CLINICAL DATA:  Acute chest pain and shortness of breath  EXAM: CHEST  2 VIEW  COMPARISON:  11/28/2011  FINDINGS: Normal heart size and vascularity. Minor streaky basilar atelectasis. No definite pneumonia, collapse or consolidation. Negative for edema, effusion or pneumothorax. Trachea midline. No acute osseous finding.  IMPRESSION: Minor bibasilar atelectasis.  No acute chest process.   Electronically Signed   By: Ruel Favors M.D.   On: 08/09/2014 16:32   Ct Angio Chest Pe W/cm &/or Wo Cm  08/09/2014   CLINICAL DATA:  One day history of chest pain and difficulty breathing  EXAM: CT ANGIOGRAPHY CHEST WITH CONTRAST  TECHNIQUE: Multidetector CT imaging of the chest was performed using the standard protocol during bolus administration of intravenous contrast. Multiplanar CT image reconstructions and MIPs were obtained to evaluate the vascular anatomy.  CONTRAST:  80mL OMNIPAQUE IOHEXOL 350 MG/ML SOLN  COMPARISON:  Chest radiograph August 09, 2014  FINDINGS: There are several small subsegmental pulmonary emboli in left lower lobe pulmonary artery branches. No larger pulmonary emboli are identified.  There is no evidence of right heart strain.  There is no thoracic aortic aneurysm or dissection.  There is patchy infiltrate in the left lower lobe, primarily in the medial segment region. There is also patchy infiltrate in the medial aspect of the lateral segment of the right middle lobe. There is more subtle patchy infiltrate in the posterior segment of the left upper lobe. There is a nodular opacity in the posterior segment of the left upper lobe measuring 1.1 x 0.9 cm. On axial slice 42 series 506, a second nodular opacity is noted in the periphery of the superior segment of the left lower lobe measuring 0.5 x 0.5 cm.  There is no appreciable thoracic adenopathy. Thymic tissue is present, normal for age. Pericardium is not thickened.  In the visualized upper abdomen there is hepatic steatosis. There  are no blastic or lytic bone lesions. Visualized thyroid appears normal.  Review of the MIP images confirms the above findings.  IMPRESSION: Several small peripheral pulmonary emboli in the left lower lobe without right heart strain. Areas of infiltrate bilaterally. Nodular opacities, largest measuring 1.1 x 0.9 cm in the left upper lobe. Given this finding, a followup study in 3 months advised to further assess. No appreciable adenopathy. Hepatic steatosis present.  Critical Value/emergent results were called by telephone at the time of interpretation on 08/09/2014 at 9:36 pm to [facility], PAC , who verbally acknowledged these results.   Electronically Signed   By: Bretta BangWilliam  Woodruff M.D.   On: 08/09/2014 21:37    Other results: EKG: Rate 141, sinus tachycardia, normal axis, long qtc 551, there are q waves and inverted T waves in III and T wave inversions in V1-V3, no RBB  Assessment & Plan by Problem: Principal Problem:   Multiple pulmonary emboli Active Problems:   CAP (community acquired pneumonia)   Hypokalemia   Elevated LFTs  Ms. Kara Haynes is a 23 yo woman with asthma who presented  with acute shortness of breath and chest pain in the setting of URI symptoms. When her shortness of breath did not improve with albuterol nebs, she was assessed with a CTA which found both patchy infiltrates suggestive of CAP and multiple small pulmonary embolisms. Her risk factors for hypercoagulability include recent infection, obesity and cigarette smoking.  Pulmonary Embolisms Visualized on CTA: Patient was tachycardic and tachypnic, but never hypoxic. Her breathing has improved and she is not currently in pain. Negative urine pregnancy, negative UDS for IVDU, no recent immobility, no OCP medications, no known malignancy. Patient does have a current infection, obesity and cigarette smoking as risk factors. Troponins negative x3 (including i-stat). Question her compliance and follow-up for coumadin vs lovenox vs NOAGs. Patient has no insurance and no PCP. - Hypercoagulability panel pending - Heparin drip was initiated in the ED 12 mL/hr continuous IV; will keep the heparin drip until plan for anticoagulation is determined (SW/financial situation/insurance to help decide coumadin vs NOAGs)  - 600 mg ibuprofen by mouth q6 hours PRN for pain  - SW for help with anticoagulation plan as an outpatient  Community Acquired Pneumonia: Patchy infiltrate in LLL and RML. Febrile to 100.8 with productive cough. - Azithromycin 500 mg qd - Ceftriaxone 1 g IV qd - IVF NS 100 mL/hr - HIV pending - RSV panel pending - Influenza panel pending - Blood cultures - Sputum culture - 600 mg ibuprofen by mouth q6 hours PRN for pain  Sepsis: Likely secondary to CAP. Fever to 100.8 and leukocytosis 12.6. Lactic acid 6.46-->4.6-->1.5. Anion gap 17-->13. - Continue with antibiotics as above - Continue with IVF as above  Asthma: Cough, wheezing, shortness of breath. Was once on albuterol and qvar, but could not afford the qvar. Classified as moderate persistent asthma. Today, had diffuse wheezes on admission and may have  been having an asthma exacerbation on top of her CAP and PEs, but wheezing is diminished with albuterol treatments. Her potential additional infection at the moment makes increasing steroids undesirable. She needs to be on a long acting steroid inhaler that is affordable along with her albuterol inhaler at discharge. - Duonebs q4 hours - Monitor peak expiratory flows; initially 200 - Patient received solu-medrol in ED; consider 10-14 days PO steroids starting tomorrow if patient continues to wheeze   Nodular Opacities Visualized on CTA: 1.1 x 0.9 cm in LUL. Could be  post-inflammatory / infectious nodules. There was no adenopathy.  - Follow up study (PET or CT) recommended per radiology in 3 months  Hypokalemia: Resolved. Likely due to her breathing treatments. Initially 2.9-->4.4. She was given 80 meq kdur in the ED. - Trend potassium - Night-time repeat BMET  Elevated ALT: 36 - Repeat CMET to make sure it is downtrending  Hyperglycemic: 163 on admission - HgbA1c pending   Tobacco and Marijuana Abuse:  - Cessation counseling  Diet:  - ADAT  DVT Ppx:  - On heparin drip  Dispo: Disposition is deferred at this time, awaiting improvement of current medical problems. Anticipated discharge in approximately 2-4 day(s).   The patient does not have a current PCP (No Pcp Per Patient) and does not need an [facility] hospital follow-up appointment after discharge.  The patient does not have transportation limitations that hinder transportation to clinic appointments.  Signed: Dionne Ano, MD 08/09/2014, 10:15 PM

## 2014-08-09 NOTE — ED Notes (Signed)
Off floor with ct

## 2014-08-09 NOTE — Progress Notes (Signed)
ANTICOAGULATION CONSULT NOTE - Initial Consult  Pharmacy Consult for heparin Indication: pulmonary embolus  No Known Allergies  Patient Measurements:   Heparin Dosing Weight: 69kg  Vital Signs: Temp: 99.4 F (37.4 C) (11/25 2033) Temp Source: Oral (11/25 2033) BP: 111/85 mmHg (11/25 2027) Pulse Rate: 121 (11/25 2027)  Labs:  Recent Labs  08/09/14 1736 08/09/14 1937  HGB 12.7  --   HCT 38.3  --   PLT 260  --   LABPROT  --  14.2  INR  --  1.08  CREATININE 0.83  --   TROPONINI  --  <0.30    CrCl cannot be calculated (Unknown ideal weight.).   Medical History: Past Medical History  Diagnosis Date  . Asthma     Medications:  Infusions:  . azithromycin 500 mg (08/09/14 2151)  . [START ON 08/10/2014] azithromycin    . cefTRIAXone (ROCEPHIN)  IV    . [START ON 08/10/2014] cefTRIAXone (ROCEPHIN)  IV    . heparin    . heparin    . sodium chloride 1,000 mL (08/09/14 2144)    Assessment: 23 yof presented to the ED with CP and SOB. Found to have several small PE's without right heart strain. Baseline CBC is WNL and she is not on any anticoagulation PTA. To start heparin for anticoagulation.   Goal of Therapy:  Heparin level 0.3-0.7 units/ml Monitor platelets by anticoagulation protocol: Yes   Plan:  1. Heparin bolus 4000 units IV x 1 2. Heparin gtt 1200 units/hr 3. Check a 6 hour heparin level 4. Daily heparin level and CBC 5. F/u plans for oral anticoagulation  Galia Rahm, Drake LeachRachel Lynn 08/09/2014,9:52 PM

## 2014-08-09 NOTE — ED Notes (Signed)
Pt back on floor from CT

## 2014-08-10 ENCOUNTER — Encounter (HOSPITAL_COMMUNITY): Payer: Self-pay | Admitting: Internal Medicine

## 2014-08-10 DIAGNOSIS — A419 Sepsis, unspecified organism: Secondary | ICD-10-CM | POA: Diagnosis present

## 2014-08-10 LAB — CBC WITH DIFFERENTIAL/PLATELET
BASOS ABS: 0 10*3/uL (ref 0.0–0.1)
Basophils Relative: 0 % (ref 0–1)
Eosinophils Absolute: 0 10*3/uL (ref 0.0–0.7)
Eosinophils Relative: 0 % (ref 0–5)
HEMATOCRIT: 36.4 % (ref 36.0–46.0)
Hemoglobin: 12.3 g/dL (ref 12.0–15.0)
Lymphocytes Relative: 6 % — ABNORMAL LOW (ref 12–46)
Lymphs Abs: 0.7 10*3/uL (ref 0.7–4.0)
MCH: 28.3 pg (ref 26.0–34.0)
MCHC: 33.8 g/dL (ref 30.0–36.0)
MCV: 83.9 fL (ref 78.0–100.0)
MONO ABS: 0.2 10*3/uL (ref 0.1–1.0)
Monocytes Relative: 2 % — ABNORMAL LOW (ref 3–12)
NEUTROS ABS: 11.7 10*3/uL — AB (ref 1.7–7.7)
Neutrophils Relative %: 92 % — ABNORMAL HIGH (ref 43–77)
PLATELETS: 301 10*3/uL (ref 150–400)
RBC: 4.34 MIL/uL (ref 3.87–5.11)
RDW: 13.7 % (ref 11.5–15.5)
WBC: 12.6 10*3/uL — ABNORMAL HIGH (ref 4.0–10.5)

## 2014-08-10 LAB — DIFFERENTIAL
BASOS ABS: 0 10*3/uL (ref 0.0–0.1)
BASOS PCT: 0 % (ref 0–1)
EOS PCT: 1 % (ref 0–5)
Eosinophils Absolute: 0.1 10*3/uL (ref 0.0–0.7)
LYMPHS ABS: 1.5 10*3/uL (ref 0.7–4.0)
Lymphocytes Relative: 16 % (ref 12–46)
MONOS PCT: 7 % (ref 3–12)
Monocytes Absolute: 0.6 10*3/uL (ref 0.1–1.0)
Neutro Abs: 6.9 10*3/uL (ref 1.7–7.7)
Neutrophils Relative %: 76 % (ref 43–77)

## 2014-08-10 LAB — TROPONIN I: Troponin I: <0.3 ng/mL (ref <0.30)

## 2014-08-10 LAB — BASIC METABOLIC PANEL
ANION GAP: 17 — AB (ref 5–15)
BUN: 6 mg/dL (ref 6–23)
CALCIUM: 9 mg/dL (ref 8.4–10.5)
CO2: 16 mEq/L — ABNORMAL LOW (ref 19–32)
Chloride: 105 mEq/L (ref 96–112)
Creatinine, Ser: 0.78 mg/dL (ref 0.50–1.10)
GFR calc Af Amer: >90 mL/min (ref >90)
Glucose, Bld: 163 mg/dL — ABNORMAL HIGH (ref 70–99)
Potassium: 4.4 mEq/L (ref 3.7–5.3)
SODIUM: 138 meq/L (ref 137–147)

## 2014-08-10 LAB — COMPREHENSIVE METABOLIC PANEL
ALBUMIN: 3.7 g/dL (ref 3.5–5.2)
ALT: 33 U/L (ref 0–35)
AST: 29 U/L (ref 0–37)
Alkaline Phosphatase: 51 U/L (ref 39–117)
Anion gap: 13 (ref 5–15)
BUN: 6 mg/dL (ref 6–23)
CALCIUM: 9.1 mg/dL (ref 8.4–10.5)
CO2: 18 meq/L — AB (ref 19–32)
CREATININE: 0.74 mg/dL (ref 0.50–1.10)
Chloride: 105 mEq/L (ref 96–112)
GFR calc Af Amer: >90 mL/min (ref >90)
Glucose, Bld: 150 mg/dL — ABNORMAL HIGH (ref 70–99)
Potassium: 4.6 mEq/L (ref 3.7–5.3)
Sodium: 136 mEq/L — ABNORMAL LOW (ref 137–147)
TOTAL PROTEIN: 7.2 g/dL (ref 6.0–8.3)
Total Bilirubin: 0.4 mg/dL (ref 0.3–1.2)

## 2014-08-10 LAB — D-DIMER, QUANTITATIVE (NOT AT ARMC)

## 2014-08-10 LAB — HEMOGLOBIN A1C
Hgb A1c MFr Bld: 4.6 % (ref <5.7)
Mean Plasma Glucose: 85 mg/dL (ref <117)

## 2014-08-10 LAB — EXPECTORATED SPUTUM ASSESSMENT W REFEX TO RESP CULTURE

## 2014-08-10 LAB — LACTIC ACID, PLASMA
LACTIC ACID, VENOUS: 4.6 mmol/L — AB (ref 0.5–2.2)
Lactic Acid, Venous: 1.5 mmol/L (ref 0.5–2.2)

## 2014-08-10 LAB — RAPID URINE DRUG SCREEN, HOSP PERFORMED
Amphetamines: NOT DETECTED
Barbiturates: NOT DETECTED
Benzodiazepines: NOT DETECTED
Cocaine: NOT DETECTED
OPIATES: NOT DETECTED
Tetrahydrocannabinol: POSITIVE — AB

## 2014-08-10 LAB — MAGNESIUM: Magnesium: 2.5 mg/dL (ref 1.5–2.5)

## 2014-08-10 LAB — HEPARIN LEVEL (UNFRACTIONATED): Heparin Unfractionated: 0.38 IU/mL (ref 0.30–0.70)

## 2014-08-10 LAB — EXPECTORATED SPUTUM ASSESSMENT W GRAM STAIN, RFLX TO RESP C

## 2014-08-10 LAB — MRSA PCR SCREENING: MRSA BY PCR: NEGATIVE

## 2014-08-10 LAB — INFLUENZA PANEL BY PCR (TYPE A & B)
H1N1FLUPCR: NOT DETECTED
INFLAPCR: NEGATIVE
Influenza B By PCR: NEGATIVE

## 2014-08-10 LAB — HOMOCYSTEINE: Homocysteine: 4.7 umol/L (ref 4.0–15.4)

## 2014-08-10 LAB — HIV ANTIBODY (ROUTINE TESTING W REFLEX): HIV: NONREACTIVE

## 2014-08-10 MED ORDER — IPRATROPIUM-ALBUTEROL 0.5-2.5 (3) MG/3ML IN SOLN
3.0000 mL | Freq: Three times a day (TID) | RESPIRATORY_TRACT | Status: DC
  Administered 2014-08-11 – 2014-08-12 (×4): 3 mL via RESPIRATORY_TRACT
  Filled 2014-08-10 (×4): qty 3

## 2014-08-10 MED ORDER — IPRATROPIUM-ALBUTEROL 0.5-2.5 (3) MG/3ML IN SOLN: 3.0000 mL | RESPIRATORY_TRACT | Status: DC | PRN

## 2014-08-10 MED ORDER — WARFARIN SODIUM 7.5 MG PO TABS
7.5000 mg | ORAL_TABLET | Freq: Once | ORAL | Status: AC
  Administered 2014-08-10: 7.5 mg via ORAL
  Filled 2014-08-10: qty 1

## 2014-08-10 MED ORDER — BENZONATATE 100 MG PO CAPS
200.0000 mg | ORAL_CAPSULE | Freq: Three times a day (TID) | ORAL | Status: DC | PRN
  Administered 2014-08-10 – 2014-08-11 (×3): 200 mg via ORAL
  Filled 2014-08-10 (×5): qty 2

## 2014-08-10 MED ORDER — SODIUM CHLORIDE 0.9 % IJ SOLN
3.0000 mL | Freq: Two times a day (BID) | INTRAMUSCULAR | Status: DC
  Administered 2014-08-10 – 2014-08-12 (×3): 3 mL via INTRAVENOUS

## 2014-08-10 MED ORDER — DEXTROSE 5 % IV SOLN: 500.0000 mg | INTRAVENOUS | Status: DC

## 2014-08-10 MED ORDER — IPRATROPIUM-ALBUTEROL 0.5-2.5 (3) MG/3ML IN SOLN
3.0000 mL | RESPIRATORY_TRACT | Status: DC
  Administered 2014-08-10 (×6): 3 mL via RESPIRATORY_TRACT
  Filled 2014-08-10 (×5): qty 3

## 2014-08-10 MED ORDER — PREDNISONE 20 MG PO TABS
40.0000 mg | ORAL_TABLET | Freq: Every day | ORAL | Status: DC
  Administered 2014-08-11 – 2014-08-12 (×2): 40 mg via ORAL
  Filled 2014-08-10 (×3): qty 2

## 2014-08-10 MED ORDER — ENOXAPARIN SODIUM 100 MG/ML ~~LOC~~ SOLN
95.0000 mg | Freq: Two times a day (BID) | SUBCUTANEOUS | Status: DC
  Administered 2014-08-10 – 2014-08-11 (×3): 95 mg via SUBCUTANEOUS
  Filled 2014-08-10 (×4): qty 1

## 2014-08-10 MED ORDER — IBUPROFEN 600 MG PO TABS
600.0000 mg | ORAL_TABLET | Freq: Four times a day (QID) | ORAL | Status: DC | PRN
  Filled 2014-08-10: qty 1

## 2014-08-10 MED ORDER — INFLUENZA VAC SPLIT QUAD 0.5 ML IM SUSY
0.5000 mL | PREFILLED_SYRINGE | INTRAMUSCULAR | Status: AC
  Administered 2014-08-11: 0.5 mL via INTRAMUSCULAR
  Filled 2014-08-10: qty 0.5

## 2014-08-10 MED ORDER — WARFARIN - PHARMACIST DOSING INPATIENT: Freq: Every day | Status: DC

## 2014-08-10 NOTE — Progress Notes (Signed)
ANTICOAGULATION CONSULT NOTE - Initial Consult  Pharmacy Consult for heparin Indication: pulmonary embolus  No Known Allergies  Patient Measurements: Height: 5\' 2"  (157.5 cm) Weight: 209 lb 3.5 oz (94.9 kg) IBW/kg (Calculated) : 50.1 Heparin Dosing Weight: 69kg  Vital Signs: Temp: 97.9 F (36.6 C) (11/26 0811) Temp Source: Oral (11/26 0811) BP: 137/65 mmHg (11/26 0825) Pulse Rate: 106 (11/26 0825)  Labs:  Recent Labs  08/09/14 1736 08/09/14 1937 08/10/14 0100 08/10/14 0317 08/10/14 0700  HGB 12.7  --   --  12.3  --   HCT 38.3  --   --  36.4  --   PLT 260  --   --  301  --   LABPROT  --  14.2  --   --   --   INR  --  1.08  --   --   --   HEPARINUNFRC  --   --   --  0.38  --   CREATININE 0.83  --  0.78 0.74  --   TROPONINI  --  <0.30 <0.30  --  <0.30    Estimated Creatinine Clearance: 116.4 mL/min (by C-G formula based on Cr of 0.74).   Medical History: Past Medical History  Diagnosis Date  . Asthma   . Tobacco abuse   . Seasonal allergies   . UTI (lower urinary tract infection)   . Obesity     Medications:  Infusions:  . sodium chloride 1,000 mL (08/10/14 0447)    Assessment: 23 yof presented to the ED on 08/09/2014 with CP and SOB. Found to have several small PE's. Patient was initially started on heparin gtt which is now transitioned to lovenox + coumadin. INR on admit is 1.08, H/H and plt remain wnl and stable with no reported s/s bleeding. Based on Coumadin dosing nomogram, patient can be started on higher dose but she was also started on azithromycin yesterday which may potentiate the effects of Coumadin so will dose conservatively.   Goal of Therapy:  Heparin level 0.3-0.7 units/ml Monitor platelets by anticoagulation protocol: Yes   Plan:  - D/c heparin gtt and start lovenox at 95 mg SQ q12h - Initiate Coumadin at 7.5 mg PO x 1 tonight - Daily INR/CBC - Monitor for s/s bleeding  Kara Haynes, PharmD Clinical Pharmacist -  Resident Pager: [phone] Pharmacy: [phone] 08/10/2014 10:36 AM

## 2014-08-10 NOTE — Progress Notes (Signed)
23yo female admitted for PE and bilateral infiltrate to begin IV ABX.  Will start Rocephin 1g IV Q24H for CrCl ~13900ml/min and monitor CBC and Cx.  Vernard GamblesVeronda Makylie Rivere, PharmD, BCPS 08/10/2014 12:38 AM

## 2014-08-10 NOTE — Progress Notes (Signed)
ANTICOAGULATION CONSULT NOTE - Follow Up Consult  Pharmacy Consult for heparin Indication: pulmonary embolus   Labs:  Recent Labs  08/09/14 1736 08/09/14 1937 08/10/14 0100 08/10/14 0317  HGB 12.7  --   --  12.3  HCT 38.3  --   --  36.4  PLT 260  --   --  301  LABPROT  --  14.2  --   --   INR  --  1.08  --   --   HEPARINUNFRC  --   --   --  0.38  CREATININE 0.83  --  0.78 0.74  TROPONINI  --  <0.30 <0.30  --      Assessment: 23yo female therapeutic on heparin with initial dosing for PE though on lower end of goal.  Goal of Therapy:  Heparin level 0.3-0.7 units/ml   Plan:  Will increase heparin gtt slightly to 1300 units/hr to help prevent level from dropping below goal given PE w/ right heart strain and check level in 6hr.  Vernard GamblesVeronda Foster Sonnier, PharmD, BCPS  08/10/2014,4:15 AM

## 2014-08-10 NOTE — Progress Notes (Signed)
Subjective: Pt states breathing has improved, has no complaints. Tearful when explained long term course of coumadin ( 6 months for now)  Objective: Vital signs in last 24 hours: Filed Vitals:   08/10/14 0600 08/10/14 0811 08/10/14 0825 08/10/14 1111  BP: 107/54 137/65 137/65   Pulse: 98 100 106   Temp:  97.9 F (36.6 C)    TempSrc:  Oral    Resp: 26 30    Height:      Weight:      SpO2: 96% 98% 100% 100%   Weight change:   Intake/Output Summary (Last 24 hours) at 08/10/14 1156 Last data filed at 08/10/14 1000  Gross per 24 hour  Intake 1414.67 ml  Output    925 ml  Net 489.67 ml    General: NAD Pulm: wheezing on rt lung fields Cardiac: RRR, no murmurs GI: active bowel sounds, soft Ext: no pedal edema Neuro: CN 2-12 grossly intact   L ab Results: Basic Metabolic Panel:  Recent Labs Lab 08/09/14 2216 08/10/14 0100 08/10/14 0317  NA  --  138 136*  K  --  4.4 4.6  CL  --  105 105  CO2  --  16* 18*  GLUCOSE  --  163* 150*  BUN  --  6 6  CREATININE  --  0.78 0.74  CALCIUM  --  9.0 9.1  MG 1.8  --  2.5   Liver Function Tests:  Recent Labs Lab 08/09/14 1937 08/10/14 0317  AST 37 29  ALT 36* 33  ALKPHOS 54 51  BILITOT 0.7 0.4  PROT 7.5 7.2  ALBUMIN 3.8 3.7    Recent Labs Lab 08/09/14 1937  LIPASE 12   No results for input(s): AMMONIA in the last 168 hours. CBC:  Recent Labs Lab 08/09/14 1736 08/09/14 1937 08/10/14 0317  WBC 9.1  --  12.6*  NEUTROABS  --  6.9 11.7*  HGB 12.7  --  12.3  HCT 38.3  --  36.4  MCV 85.1  --  83.9  PLT 260  --  301   Cardiac Enzymes:  Recent Labs Lab 08/09/14 1937 08/10/14 0100 08/10/14 0700  TROPONINI <0.30 <0.30 <0.30   D-Dimer:  Recent Labs Lab 08/10/14 0935  DDIMER <0.27   Hemoglobin A1C:  Recent Labs Lab 08/10/14 0100  HGBA1C 4.6   Coagulation:  Recent Labs Lab 08/09/14 1937  LABPROT 14.2  INR 1.08   Urine Drug Screen: Drugs of Abuse     Component Value Date/Time   LABOPIA NONE DETECTED 08/10/2014 0230   COCAINSCRNUR NONE DETECTED 08/10/2014 0230   LABBENZ NONE DETECTED 08/10/2014 0230   AMPHETMU NONE DETECTED 08/10/2014 0230   THCU POSITIVE* 08/10/2014 0230   LABBARB NONE DETECTED 08/10/2014 0230    Urinalysis:  Recent Labs Lab 08/09/14 2035  COLORURINE YELLOW  LABSPEC 1.003*  PHURINE 5.5  GLUCOSEU NEGATIVE  HGBUR TRACE*  BILIRUBINUR NEGATIVE  KETONESUR NEGATIVE  PROTEINUR NEGATIVE  UROBILINOGEN 0.2  NITRITE NEGATIVE  LEUKOCYTESUR NEGATIVE     Micro Results: Recent Results (from the past 240 hour(s))  MRSA PCR Screening     Status: None   Collection Time: 08/10/14 12:12 AM  Result Value Ref Range Status   MRSA by PCR NEGATIVE NEGATIVE Final    Comment:        The GeneXpert MRSA Assay (FDA approved for NASAL specimens only), is one component of a comprehensive MRSA colonization surveillance program. It is not intended to diagnose MRSA infection nor to  guide or monitor treatment for MRSA infections.   Culture, expectorated sputum-assessment     Status: None   Collection Time: 08/10/14  4:10 AM  Result Value Ref Range Status   Specimen Description SPUTUM  Final   Special Requests NONE  Final   Sputum evaluation   Final    THIS SPECIMEN IS ACCEPTABLE. RESPIRATORY CULTURE REPORT TO FOLLOW.   Report Status 08/10/2014 FINAL  Final  Culture, respiratory (NON-Expectorated)     Status: None (Preliminary result)   Collection Time: 08/10/14  4:10 AM  Result Value Ref Range Status   Specimen Description SPUTUM  Final   Special Requests NONE  Final   Gram Stain   Final    MODERATE WBC PRESENT,BOTH PMN AND MONONUCLEAR FEW SQUAMOUS EPITHELIAL CELLS PRESENT FEW GRAM POSITIVE COCCI IN PAIRS IN CHAINS IN CLUSTERS FEW GRAM VARIABLE ROD Performed at [facility]    Culture PENDING  Incomplete   Report Status PENDING  Incomplete   Studies/Results: Dg Chest 2 View (if Patient Has Fever And/or Copd)  08/09/2014   CLINICAL  DATA:  Acute chest pain and shortness of breath  EXAM: CHEST  2 VIEW  COMPARISON:  11/28/2011  FINDINGS: Normal heart size and vascularity. Minor streaky basilar atelectasis. No definite pneumonia, collapse or consolidation. Negative for edema, effusion or pneumothorax. Trachea midline. No acute osseous finding.  IMPRESSION: Minor bibasilar atelectasis.  No acute chest process.   Electronically Signed   By: Ruel Favorsrevor  Shick M.D.   On: 08/09/2014 16:32   Ct Angio Chest Pe W/cm &/or Wo Cm  08/09/2014   CLINICAL DATA:  One day history of chest pain and difficulty breathing  EXAM: CT ANGIOGRAPHY CHEST WITH CONTRAST  TECHNIQUE: Multidetector CT imaging of the chest was performed using the standard protocol during bolus administration of intravenous contrast. Multiplanar CT image reconstructions and MIPs were obtained to evaluate the vascular anatomy.  CONTRAST:  80mL OMNIPAQUE IOHEXOL 350 MG/ML SOLN  COMPARISON:  Chest radiograph August 09, 2014  FINDINGS: There are several small subsegmental pulmonary emboli in left lower lobe pulmonary artery branches. No larger pulmonary emboli are identified. There is no evidence of right heart strain.  There is no thoracic aortic aneurysm or dissection.  There is patchy infiltrate in the left lower lobe, primarily in the medial segment region. There is also patchy infiltrate in the medial aspect of the lateral segment of the right middle lobe. There is more subtle patchy infiltrate in the posterior segment of the left upper lobe. There is a nodular opacity in the posterior segment of the left upper lobe measuring 1.1 x 0.9 cm. On axial slice 42 series 506, a second nodular opacity is noted in the periphery of the superior segment of the left lower lobe measuring 0.5 x 0.5 cm.  There is no appreciable thoracic adenopathy. Thymic tissue is present, normal for age. Pericardium is not thickened.  In the visualized upper abdomen there is hepatic steatosis. There are no blastic or  lytic bone lesions. Visualized thyroid appears normal.  Review of the MIP images confirms the above findings.  IMPRESSION: Several small peripheral pulmonary emboli in the left lower lobe without right heart strain. Areas of infiltrate bilaterally. Nodular opacities, largest measuring 1.1 x 0.9 cm in the left upper lobe. Given this finding, a followup study in 3 months advised to further assess. No appreciable adenopathy. Hepatic steatosis present.  Critical Value/emergent results were called by telephone at the time of interpretation on 08/09/2014 at 9:36 pm  to [facility] LLC[facility]ENNIFER PIEPENBRINK, PAC , who verbally acknowledged these results.   Electronically Signed   By: Bretta BangWilliam  Woodruff M.D.   On: 08/09/2014 21:37   Medications: I have reviewed the patient's current medications. Scheduled Meds: . azithromycin  500 mg Intravenous Q24H  . cefTRIAXone (ROCEPHIN)  IV  1 g Intravenous Q24H  . enoxaparin (LOVENOX) injection  95 mg Subcutaneous Q12H  . ipratropium-albuterol  3 mL Nebulization Q4H  . sodium chloride  3 mL Intravenous Q12H  . warfarin  7.5 mg Oral ONCE-1800  . Warfarin - Pharmacist Dosing Inpatient   Does not apply q1800   Continuous Infusions:  PRN Meds:.benzonatate, ibuprofen Assessment/Plan: Principal Problem:   Multiple pulmonary emboli Active Problems:   CAP (community acquired pneumonia)   Hypokalemia   Elevated LFTs   Pulmonary embolism   Abnormal chest CT   Asthma exacerbation   Sepsis   Provoked, uncomplicated pulmonary embolisms seen on CTA: Patient was tachycardic and tachypnic, but never hypoxic. - Hypercoagulability panel pending, antithrombin III WNL - Heparin drip transitioned to lovenox, pharm to dose coumadin - 600 mg ibuprofen by mouth q6 hours PRN for pain  - LE dopplers ordered - D dimer <0.27 - will have pt f/u with our clinic or [facility] in 6 months for re-evaluation.  - repeat CT scan in 3 months  - SW for help with anticoagulation plan as an  outpatient  Community Acquired Pneumonia: Meets SIRS criteria: fever to 100.8, tachycardic, and leukocytosis 12.6. Lactic acid 6.46-->4.6-->1.5. Anion gap 17-->13. Patchy infiltrate in LLL and RML.  - Azithromycin 500 mg and Ceftriaxone 1 g IV day 2 - HIV negative - RSV panel pending - Influenza panel negative  - Blood cultures pending - Sputum culture prelim reading GPC - 600 mg ibuprofen by mouth q6 hours PRN for pain  Asthma:  moderate persistent asthma.  -She needs to be on a long acting steroid inhaler that is affordable along with her albuterol inhaler at discharge. - Duonebs q4 hours - Monitor peak expiratory flows; initially 200 - pred 40 x 5 days then taper  Nodular Opacities Visualized on CTA: 1.1 x 0.9 cm in LUL. Could be post-inflammatory / infectious nodules. There was no adenopathy.  - Follow up study (PET or CT) recommended per radiology in 3 months  Elevated ALT: ALT 36 on admission - Repeat CMET this AM, ALT 33 WNL  Hyperglycemic: 163 on admission - HgbA1c 4.6, will continue to monitor  Tobacco and Marijuana Abuse:  - Cessation counseling  Diet:  - regular  DVT Ppx:  - On heparin drip  Dispo: Disposition is deferred at this time, awaiting improvement of current medical problems. Anticipated discharge in approximately 2-4 day(s).   The patient does not have a current PCP (No Pcp Per Patient) and [facility] after discharge. Can follow up in our clinic.   The patient does not have transportation limitations that hinder transportation to clinic appointments.   .Services Needed at time of discharge: Y = Yes, Blank = No PT:   OT:   RN:   Equipment:   Other:     LOS: 1 day   Gara Kroneriana Jorryn Casagrande, MD 08/10/2014, 11:56 AM

## 2014-08-10 NOTE — Progress Notes (Signed)
PEFR performed per MD. Best attempt out of several ~26700mL (poor effort). Will attempt at later point.

## 2014-08-11 DIAGNOSIS — I2699 Other pulmonary embolism without acute cor pulmonale: Secondary | ICD-10-CM

## 2014-08-11 LAB — LUPUS ANTICOAGULANT PANEL
DRVVT: 34.1 secs (ref <42.9)
Lupus Anticoagulant: NOT DETECTED
PTT Lupus Anticoagulant: 112.4 secs — ABNORMAL HIGH (ref 28.0–43.0)
PTTLA 4:1 Mix: 111.4 secs — ABNORMAL HIGH (ref 28.0–43.0)
PTTLA Confirmation: 7.1 secs (ref <8.0)

## 2014-08-11 LAB — URINE CULTURE: Colony Count: 40000

## 2014-08-11 LAB — CBC
HEMATOCRIT: 36.2 % (ref 36.0–46.0)
Hemoglobin: 12 g/dL (ref 12.0–15.0)
MCH: 28.2 pg (ref 26.0–34.0)
MCHC: 33.1 g/dL (ref 30.0–36.0)
MCV: 85.2 fL (ref 78.0–100.0)
Platelets: 319 10*3/uL (ref 150–400)
RBC: 4.25 MIL/uL (ref 3.87–5.11)
RDW: 14.1 % (ref 11.5–15.5)
WBC: 12.8 10*3/uL — ABNORMAL HIGH (ref 4.0–10.5)

## 2014-08-11 LAB — BASIC METABOLIC PANEL
ANION GAP: 16 — AB (ref 5–15)
BUN: 12 mg/dL (ref 6–23)
CALCIUM: 9 mg/dL (ref 8.4–10.5)
CO2: 22 meq/L (ref 19–32)
Chloride: 101 mEq/L (ref 96–112)
Creatinine, Ser: 0.93 mg/dL (ref 0.50–1.10)
GFR calc Af Amer: >90 mL/min (ref >90)
GFR, EST NON AFRICAN AMERICAN: 85 mL/min — AB (ref >90)
GLUCOSE: 83 mg/dL (ref 70–99)
POTASSIUM: 4.1 meq/L (ref 3.7–5.3)
Sodium: 139 mEq/L (ref 137–147)

## 2014-08-11 LAB — RESPIRATORY VIRUS PANEL
ADENOVIRUS: NOT DETECTED
INFLUENZA A H1: NOT DETECTED
INFLUENZA A H3: NOT DETECTED
INFLUENZA B 1: NOT DETECTED
Influenza A: NOT DETECTED
Metapneumovirus: NOT DETECTED
PARAINFLUENZA 3 A: NOT DETECTED
Parainfluenza 1: NOT DETECTED
Parainfluenza 2: NOT DETECTED
RESPIRATORY SYNCYTIAL VIRUS A: NOT DETECTED
RHINOVIRUS: DETECTED — AB
Respiratory Syncytial Virus B: NOT DETECTED

## 2014-08-11 LAB — PROTIME-INR
INR: 1.12 (ref 0.00–1.49)
Prothrombin Time: 14.5 seconds (ref 11.6–15.2)

## 2014-08-11 LAB — PROTEIN S ACTIVITY: Protein S Activity: 79 % (ref 69–129)

## 2014-08-11 LAB — PROTEIN C ACTIVITY: PROTEIN C ACTIVITY: 122 % (ref 75–133)

## 2014-08-11 MED ORDER — RIVAROXABAN (XARELTO) EDUCATION KIT FOR DVT/PE PATIENTS
PACK | Freq: Once | Status: AC
  Administered 2014-08-11: 1
  Filled 2014-08-11: qty 1

## 2014-08-11 MED ORDER — RIVAROXABAN 15 MG PO TABS
15.0000 mg | ORAL_TABLET | Freq: Two times a day (BID) | ORAL | Status: DC
  Administered 2014-08-12: 15 mg via ORAL
  Filled 2014-08-11 (×3): qty 1

## 2014-08-11 MED ORDER — WARFARIN SODIUM 7.5 MG PO TABS
7.5000 mg | ORAL_TABLET | Freq: Once | ORAL | Status: DC
  Filled 2014-08-11: qty 1

## 2014-08-11 MED ORDER — AZITHROMYCIN 250 MG PO TABS
250.0000 mg | ORAL_TABLET | Freq: Every day | ORAL | Status: DC
  Administered 2014-08-11: 250 mg via ORAL
  Filled 2014-08-11 (×2): qty 1

## 2014-08-11 MED ORDER — AMOXICILLIN-POT CLAVULANATE 500-125 MG PO TABS
500.0000 mg | ORAL_TABLET | Freq: Three times a day (TID) | ORAL | Status: DC
  Administered 2014-08-11 – 2014-08-12 (×3): 500 mg via ORAL
  Filled 2014-08-11 (×6): qty 1

## 2014-08-11 MED ORDER — RIVAROXABAN 15 MG PO TABS
15.0000 mg | ORAL_TABLET | Freq: Two times a day (BID) | ORAL | Status: AC
  Administered 2014-08-11: 15 mg via ORAL
  Filled 2014-08-11: qty 1

## 2014-08-11 MED ORDER — RIVAROXABAN 20 MG PO TABS: 20.0000 mg | ORAL_TABLET | Freq: Every day | ORAL | Status: DC

## 2014-08-11 MED ORDER — AZITHROMYCIN 500 MG PO TABS
500.0000 mg | ORAL_TABLET | Freq: Every day | ORAL | Status: DC
Start: 2014-08-11 — End: 2014-08-11
  Filled 2014-08-11: qty 1

## 2014-08-11 NOTE — Progress Notes (Signed)
Patient currently on room air with a strong productive cough. Walked in unit with RN. Noc/o SOB. Prepared fro transfer at 12 noon

## 2014-08-11 NOTE — Plan of Care (Signed)
Problem: Phase I Progression Outcomes Goal: OOB as tolerated unless otherwise ordered Outcome: Completed/Met Date Met:  08/11/14 Goal: Voiding-avoid urinary catheter unless indicated Outcome: Completed/Met Date Met:  08/11/14 Goal: Other Phase I Outcomes/Goals Outcome: Not Applicable Date Met:  08/11/14  Problem: Phase II Progression Outcomes Goal: Progress activity as tolerated unless otherwise ordered Outcome: Completed/Met Date Met:  08/11/14 Goal: IV changed to normal saline lock Outcome: Completed/Met Date Met:  08/11/14 Goal: Obtain order to discontinue catheter if appropriate Outcome: Not Applicable Date Met:  08/11/14 Goal: Other Phase II Outcomes/Goals Outcome: Not Applicable Date Met:  08/11/14  Problem: Phase III Progression Outcomes Goal: Voiding independently Outcome: Completed/Met Date Met:  08/11/14 Goal: Foley discontinued Outcome: Not Applicable Date Met:  08/11/14 Goal: Other Phase III Outcomes/Goals Outcome: Not Applicable Date Met:  08/11/14  Problem: Discharge Progression Outcomes Goal: Tolerating diet Outcome: Completed/Met Date Met:  08/11/14 Goal: Other Discharge Outcomes/Goals Outcome: Not Applicable Date Met:  08/11/14     

## 2014-08-11 NOTE — Care Management Note (Signed)
    Page 1 of 1   08/11/2014     1:20:50 PM CARE MANAGEMENT NOTE 08/11/2014  Patient:  Kara Haynes,Kara Haynes   Account Number:  [id number]  Date Initiated:  08/11/2014  Documentation initiated by:  Shanique Aslinger  Subjective/Objective Assessment:   dx PE; lives with SO     DC Planning Services  CM consult  Medication Assistance      Status of service:  In process, will continue to follow  Per UR Regulation:  Reviewed for med. necessity/level of care/duration of stay  Comments:  08/11/14 1245 Verdis PrimeHenrietta Samier Jaco RN MSN BSN CCM  [phone] Pt will need anticoagulant on d/c.  After discussing with pt, CM determined that she will qualify for pharmaceutical patient assistant program for Xarelto based on her household income.  Explained process to pt and provided application for her to complete and include copy of 2014 tax return.  Physician portion of the application placed on shadow chart.  Pt states she will f/u @ [facility] @ which time completed application can be faxed to pharmaceutical company.  Provided pt with card for free 30-day supply of Xarelto.

## 2014-08-11 NOTE — Plan of Care (Signed)
Problem: Phase I Progression Outcomes Goal: Dyspnea controlled at rest Outcome: Completed/Met Date Met:  08/11/14 Goal: Pain controlled with appropriate interventions Outcome: Completed/Met Date Met:  08/11/14 Goal: OOB as tolerated unless otherwise ordered Outcome: Completed/Met Date Met:  08/11/14 Patient ambulating independently . Goal: Code status addressed with pt/family Outcome: Completed/Met Date Met:  08/11/14 Goal: Initial discharge plan identified Outcome: Completed/Met Date Met:  08/11/14 Goal: Voiding-avoid urinary catheter unless indicated Outcome: Completed/Met Date Met:  08/11/14 Goal: Hemodynamically stable Outcome: Completed/Met Date Met:  08/11/14 Goal: Other Phase I Outcomes/Goals Outcome: Not Applicable Date Met:  98/92/11  Problem: Phase II Progression Outcomes Goal: Encourage coughing & deep breathing Outcome: Completed/Met Date Met:  08/11/14

## 2014-08-11 NOTE — Clinical Social Work Note (Signed)
CSW Consult Acknowledged:   CSW received consult for linking the pt with a PCP, pt does not have insurance. CSW contacted the Case Manager, RN to inquire if the pt can be link with community wellness clinic. The case manager,RN is aware. CSW will sign off.    Betha Shadix, MSW, LCSWA [phone]

## 2014-08-11 NOTE — Progress Notes (Signed)
Subjective: Pt in bed w/ GF. Denies chest pain, chest palpitations, fevers, and SOB. Was able to eat half of her breakfast this morning.  States she hasn't smoked in a few days and plans to quit.   Objective: Vital signs in last 24 hours: Filed Vitals:   08/10/14 1948 08/11/14 0011 08/11/14 0451 08/11/14 0738  BP: 110/61 124/69 126/75 119/60  Pulse: 94 98 84 81  Temp: 98.3 F (36.8 C) 98 F (36.7 C) 97.8 F (36.6 C) 97.8 F (36.6 C)  TempSrc: Oral Oral Oral Oral  Resp: 25 23 23 17   Height:      Weight:      SpO2: 98% 100% 100% 100%   Weight change:   Intake/Output Summary (Last 24 hours) at 08/11/14 0825 Last data filed at 08/11/14 0400  Gross per 24 hour  Intake   1472 ml  Output   1000 ml  Net    472 ml    General: NAD Pulm: CTAB Cardiac: RRR, no murmurs GI: active bowel sounds, soft Ext: no pedal edema Neuro: CN 2-12 grossly intact   L ab Results: Basic Metabolic Panel:  Recent Labs Lab 08/09/14 2216  08/10/14 0317 08/11/14 0235  NA  --   < > 136* 139  K  --   < > 4.6 4.1  CL  --   < > 105 101  CO2  --   < > 18* 22  GLUCOSE  --   < > 150* 83  BUN  --   < > 6 12  CREATININE  --   < > 0.74 0.93  CALCIUM  --   < > 9.1 9.0  MG 1.8  --  2.5  --   < > = values in this interval not displayed. Liver Function Tests:  Recent Labs Lab 08/09/14 1937 08/10/14 0317  AST 37 29  ALT 36* 33  ALKPHOS 54 51  BILITOT 0.7 0.4  PROT 7.5 7.2  ALBUMIN 3.8 3.7    Recent Labs Lab 08/09/14 1937  LIPASE 12   CBC:  Recent Labs Lab 08/09/14 1937 08/10/14 0317 08/11/14 0235  WBC  --  12.6* 12.8*  NEUTROABS 6.9 11.7*  --   HGB  --  12.3 12.0  HCT  --  36.4 36.2  MCV  --  83.9 85.2  PLT  --  301 319   Cardiac Enzymes:  Recent Labs Lab 08/09/14 1937 08/10/14 0100 08/10/14 0700  TROPONINI <0.30 <0.30 <0.30   D-Dimer:  Recent Labs Lab 08/10/14 0935  DDIMER <0.27   Hemoglobin A1C:  Recent Labs Lab 08/10/14 0100  HGBA1C 4.6    Coagulation:  Recent Labs Lab 08/09/14 1937 08/11/14 0235  LABPROT 14.2 14.5  INR 1.08 1.12     Urinalysis:  Recent Labs Lab 08/09/14 2035  COLORURINE YELLOW  LABSPEC 1.003*  PHURINE 5.5  GLUCOSEU NEGATIVE  HGBUR TRACE*  BILIRUBINUR NEGATIVE  KETONESUR NEGATIVE  PROTEINUR NEGATIVE  UROBILINOGEN 0.2  NITRITE NEGATIVE  LEUKOCYTESUR NEGATIVE     Micro Results: Recent Results (from the past 240 hour(s))  Urine culture     Status: None   Collection Time: 08/09/14  8:35 PM  Result Value Ref Range Status   Specimen Description URINE, CLEAN CATCH  Final   Special Requests NONE  Final   Culture  Setup Time   Final    08/09/2014 23:09 Performed at [facility]   Final    40,000 COLONIES/ML  Performed at [facility]    Culture   Final    DIPHTHEROIDS(CORYNEBACTERIUM SPECIES) Note: Standardized susceptibility testing for this organism is not available. Performed at [facility]    Report Status 08/11/2014 FINAL  Final  MRSA PCR Screening     Status: None   Collection Time: 08/10/14 12:12 AM  Result Value Ref Range Status   MRSA by PCR NEGATIVE NEGATIVE Final    Comment:        The GeneXpert MRSA Assay (FDA approved for NASAL specimens only), is one component of a comprehensive MRSA colonization surveillance program. It is not intended to diagnose MRSA infection nor to guide or monitor treatment for MRSA infections.   Culture, expectorated sputum-assessment     Status: None   Collection Time: 08/10/14  4:10 AM  Result Value Ref Range Status   Specimen Description SPUTUM  Final   Special Requests NONE  Final   Sputum evaluation   Final    THIS SPECIMEN IS ACCEPTABLE. RESPIRATORY CULTURE REPORT TO FOLLOW.   Report Status 08/10/2014 FINAL  Final  Culture, respiratory (NON-Expectorated)     Status: None (Preliminary result)   Collection Time: 08/10/14  4:10 AM  Result Value Ref Range Status   Specimen Description  SPUTUM  Final   Special Requests NONE  Final   Gram Stain   Final    MODERATE WBC PRESENT,BOTH PMN AND MONONUCLEAR FEW SQUAMOUS EPITHELIAL CELLS PRESENT FEW GRAM POSITIVE COCCI IN PAIRS IN CHAINS IN CLUSTERS FEW GRAM VARIABLE ROD Performed at [facility]    Culture PENDING  Incomplete   Report Status PENDING  Incomplete   Studies/Results: Dg Chest 2 View (if Patient Has Fever And/or Copd)  08/09/2014   CLINICAL DATA:  Acute chest pain and shortness of breath  EXAM: CHEST  2 VIEW  COMPARISON:  11/28/2011  FINDINGS: Normal heart size and vascularity. Minor streaky basilar atelectasis. No definite pneumonia, collapse or consolidation. Negative for edema, effusion or pneumothorax. Trachea midline. No acute osseous finding.  IMPRESSION: Minor bibasilar atelectasis.  No acute chest process.   Electronically Signed   By: Ruel Favorsrevor  Shick M.D.   On: 08/09/2014 16:32   Ct Angio Chest Pe W/cm &/or Wo Cm  08/09/2014   CLINICAL DATA:  One day history of chest pain and difficulty breathing  EXAM: CT ANGIOGRAPHY CHEST WITH CONTRAST  TECHNIQUE: Multidetector CT imaging of the chest was performed using the standard protocol during bolus administration of intravenous contrast. Multiplanar CT image reconstructions and MIPs were obtained to evaluate the vascular anatomy.  CONTRAST:  80mL OMNIPAQUE IOHEXOL 350 MG/ML SOLN  COMPARISON:  Chest radiograph August 09, 2014  FINDINGS: There are several small subsegmental pulmonary emboli in left lower lobe pulmonary artery branches. No larger pulmonary emboli are identified. There is no evidence of right heart strain.  There is no thoracic aortic aneurysm or dissection.  There is patchy infiltrate in the left lower lobe, primarily in the medial segment region. There is also patchy infiltrate in the medial aspect of the lateral segment of the right middle lobe. There is more subtle patchy infiltrate in the posterior segment of the left upper lobe. There is a nodular  opacity in the posterior segment of the left upper lobe measuring 1.1 x 0.9 cm. On axial slice 42 series 506, a second nodular opacity is noted in the periphery of the superior segment of the left lower lobe measuring 0.5 x 0.5 cm.  There is no appreciable thoracic adenopathy.  Thymic tissue is present, normal for age. Pericardium is not thickened.  In the visualized upper abdomen there is hepatic steatosis. There are no blastic or lytic bone lesions. Visualized thyroid appears normal.  Review of the MIP images confirms the above findings.  IMPRESSION: Several small peripheral pulmonary emboli in the left lower lobe without right heart strain. Areas of infiltrate bilaterally. Nodular opacities, largest measuring 1.1 x 0.9 cm in the left upper lobe. Given this finding, a followup study in 3 months advised to further assess. No appreciable adenopathy. Hepatic steatosis present.  Critical Value/emergent results were called by telephone at the time of interpretation on 08/09/2014 at 9:36 pm to [facility]ENNIFER PIEPENBRINK, PAC , who verbally acknowledged these results.   Electronically Signed   By: Bretta BangWilliam  Woodruff M.D.   On: 08/09/2014 21:37   Medications: I have reviewed the patient's current medications. Scheduled Meds: . azithromycin  500 mg Intravenous Q24H  . cefTRIAXone (ROCEPHIN)  IV  1 g Intravenous Q24H  . enoxaparin (LOVENOX) injection  95 mg Subcutaneous Q12H  . Influenza vac split quadrivalent PF  0.5 mL Intramuscular Tomorrow-1000  . ipratropium-albuterol  3 mL Nebulization TID  . predniSONE  40 mg Oral Q breakfast  . sodium chloride  3 mL Intravenous Q12H  . Warfarin - Pharmacist Dosing Inpatient   Does not apply q1800   Continuous Infusions:  PRN Meds:.benzonatate, ibuprofen, ipratropium-albuterol Assessment/Plan: Principal Problem:   Multiple pulmonary emboli Active Problems:   CAP (community acquired pneumonia)   Hypokalemia   Elevated LFTs   Pulmonary embolism   Abnormal chest CT    Asthma exacerbation   Sepsis   Provoked, uncomplicated pulmonary embolisms seen on CTA:  D dimer <0.27  - Hypercoagulability panel pending, antithrombin III WNL - on lovenox, pharm to dose coumadin- received 7.5mg  once last night, INR 1.12 today. Per SW pt is eligible to up to 1 year of xarelto for free. Will transition to xarelto 15mg  BID x 21 days, then 20mg  QD today around 4:00pm  ( received 1mg /kg dose of lovenox at 9:53am this morning)instead of warfarin as this will likely improve compliance. - will contact pharmacy to speak w/ pt regarding xarelto education - 600 mg ibuprofen by mouth q6 hours PRN for pain, did not require any overnight - LE dopplers ordered, called vascular who states pt will be seen today - will have pt f/u with our clinic or [place] in 6 months for re-evaluation.  [facility]  Community Acquired Pneumonia: Meets SIRS criteria: fever to 100.8, tachycardic, and leukocytosis 12.6. Lactic acid 6.46-->4.6-->1.5. Anion gap 17-->13. Patchy infiltrate in LLL and RML.  - Azithromycin 500 mg and Ceftriaxone 1 g IV day 3, will transition to PO abx today (azithro and augmentin) - RSV panel pending - Blood cultures pending - Sputum culture prelim reading GPC in chains and clusters - 600 mg ibuprofen by mouth q6 hours PRN for pain  Asthma:  moderate persistent asthma, will prescribe long acting steroid inhaler that is affordable along with her albuterol inhaler at discharge. - Duonebs q4 hours - pred 40 x 5 days then taper, day 2  Tobacco and Marijuana Abuse:  - denies cessation counseling at this time, states she has not smoked in a few days and is going to stop smoking now  Diet:  - regular  DVT Ppx:  - lovenox, will start xarelto today around 4:00pm  Dispo: Disposition is deferred at this time, awaiting improvement of current medical problems. Anticipated discharge in approximately  1-2 day(s).   The patient does not have a current PCP (No Pcp Per  Patient) and does not need an [facility] hospital follow-up appointment after discharge. Can follow up in our clinic.   The patient does not have transportation limitations that hinder transportation to clinic appointments.   .Services Needed at time of discharge: Y = Yes, Blank = No PT:   OT:   RN:   Equipment:   Other:     LOS: 2 days   Gara Kroner, MD 08/11/2014, 8:25 AM

## 2014-08-11 NOTE — Progress Notes (Addendum)
ANTICOAGULATION CONSULT NOTE - Follow-up  Pharmacy Consult for lovenox Indication: pulmonary embolus  No Known Allergies  Patient Measurements: Height: 5\' 2"  (157.5 cm) Weight: 209 lb 3.5 oz (94.9 kg) IBW/kg (Calculated) : 50.1 Heparin Dosing Weight: 69kg  Vital Signs: Temp: 97.8 F (36.6 C) (11/27 0738) Temp Source: Oral (11/27 0738) BP: 119/60 mmHg (11/27 0738) Pulse Rate: 81 (11/27 0738)  Labs:  Recent Labs  08/09/14 1736 08/09/14 1937 08/10/14 0100 08/10/14 0317 08/10/14 0700 08/11/14 0235  HGB 12.7  --   --  12.3  --  12.0  HCT 38.3  --   --  36.4  --  36.2  PLT 260  --   --  301  --  319  LABPROT  --  14.2  --   --   --  14.5  INR  --  1.08  --   --   --  1.12  HEPARINUNFRC  --   --   --  0.38  --   --   CREATININE 0.83  --  0.78 0.74  --  0.93  TROPONINI  --  <0.30 <0.30  --  <0.30  --     Estimated Creatinine Clearance: 100.1 mL/min (by C-G formula based on Cr of 0.93).  Assessment: 23 yof presented to the ED on 08/09/2014 with CP and SOB. Found to have several small PE's. Patient was initially started on heparin gtt which is now transitioned to lovenox + coumadin. INR remains subtherapeutic as expected after only 1 dose of warfarin. CBC remains WNL and no bleeding noted. She is also on azithromycin which may increase the INR. Today is D#2/5 of overalp of lovenox & coumadin.   Goal of Therapy:  INR 2-3   Plan:  1. Repeat coumadin 7.5mg  PO x 1 tonight 2. F/u AM INR  Lysle Pearlachel Nycole Kawahara, PharmD, BCPS Pager # [phone] 08/11/2014 8:52 AM   Addendum: Transitioning to xarelto for PE. Already received dose of lovenox this AM. Will start xarelto tonight  Plan: 1. Xarelto 15mg  BID x 21 days then 20mg  daily  2. F/u renal fxn, S&S of bleeding  Lysle Pearlachel Cinderella Christoffersen, PharmD, BCPS Pager # [phone] 08/11/2014 11:25 AM

## 2014-08-11 NOTE — Progress Notes (Signed)
*  Preliminary Results* Bilateral lower extremity venous duplex completed. Bilateral lower extremities are negative for deep vein thrombosis. There is no evidence of Baker's cyst bilaterally.  08/11/2014  Gertie FeyMichelle Vanesha Athens, RVT, RDCS, RDMS

## 2014-08-11 NOTE — Progress Notes (Signed)
Patient trasfered from [facility] to [phone] via wheelchair; alert and oriented x 4; no complaints of pain; IV saline locked in RAC; skin intact. Orient patient to room and unit; instructed how to use the call bell and  fall risk precautions. Will continue to monitor the patient.

## 2014-08-12 LAB — CULTURE, RESPIRATORY W GRAM STAIN: Culture: NORMAL

## 2014-08-12 LAB — CBC
HEMATOCRIT: 37 % (ref 36.0–46.0)
Hemoglobin: 12.5 g/dL (ref 12.0–15.0)
MCH: 29.1 pg (ref 26.0–34.0)
MCHC: 33.8 g/dL (ref 30.0–36.0)
MCV: 86.2 fL (ref 78.0–100.0)
PLATELETS: 317 10*3/uL (ref 150–400)
RBC: 4.29 MIL/uL (ref 3.87–5.11)
RDW: 13.8 % (ref 11.5–15.5)
WBC: 7.7 10*3/uL (ref 4.0–10.5)

## 2014-08-12 MED ORDER — RIVAROXABAN 20 MG PO TABS: 20.0000 mg | ORAL_TABLET | Freq: Every day | ORAL | Status: DC

## 2014-08-12 MED ORDER — ALBUTEROL SULFATE 108 (90 BASE) MCG/ACT IN AEPB: 1.0000 | INHALATION_SPRAY | Freq: Four times a day (QID) | RESPIRATORY_TRACT | Status: DC | PRN

## 2014-08-12 MED ORDER — RIVAROXABAN 15 MG PO TABS: 15.0000 mg | ORAL_TABLET | Freq: Two times a day (BID) | ORAL | Status: DC

## 2014-08-12 MED ORDER — AZITHROMYCIN 250 MG PO TABS: ORAL_TABLET | ORAL | Status: DC

## 2014-08-12 MED ORDER — AMOXICILLIN-POT CLAVULANATE 500-125 MG PO TABS: 500.0000 mg | ORAL_TABLET | Freq: Three times a day (TID) | ORAL | Status: DC

## 2014-08-12 MED ORDER — PREDNISONE 20 MG PO TABS: 40.0000 mg | ORAL_TABLET | Freq: Every day | ORAL | Status: DC

## 2014-08-12 NOTE — Progress Notes (Signed)
Subjective: No chest pain, at rest or with deep breathing. Pt sitting up in bed comfortably. Ready to go home. Card for Xarelto given to patient.   Objective: Vital signs in last 24 hours: Filed Vitals:   08/11/14 2017 08/11/14 2211 08/12/14 0505 08/12/14 0914  BP:  125/87 153/88   Pulse:  89 80   Temp:  98.9 F (37.2 C) 98.1 F (36.7 C)   TempSrc:  Oral Oral   Resp:  18 18   Height:      Weight:      SpO2: 96% 100% 98% 97%   Weight change:   Intake/Output Summary (Last 24 hours) at 08/12/14 1007 Last data filed at 08/12/14 0505  Gross per 24 hour  Intake    222 ml  Output    200 ml  Net     22 ml   Physical Exam-  General: sitting in bed, alert and oriented. Pulm: CTAB, mild wheezing on exam. Cardiac: RRR, no murmurs or rubs GI: active bowel sounds, soft Ext: no pedal edema Neuro: Moving extremities, sponteously.    L ab Results: Basic Metabolic Panel:  Recent Labs Lab 08/09/14 2216  08/10/14 0317 08/11/14 0235  NA  --   < > 136* 139  K  --   < > 4.6 4.1  CL  --   < > 105 101  CO2  --   < > 18* 22  GLUCOSE  --   < > 150* 83  BUN  --   < > 6 12  CREATININE  --   < > 0.74 0.93  CALCIUM  --   < > 9.1 9.0  MG 1.8  --  2.5  --   < > = values in this interval not displayed. Liver Function Tests:  Recent Labs Lab 08/09/14 1937 08/10/14 0317  AST 37 29  ALT 36* 33  ALKPHOS 54 51  BILITOT 0.7 0.4  PROT 7.5 7.2  ALBUMIN 3.8 3.7    Recent Labs Lab 08/09/14 1937  LIPASE 12   CBC:  Recent Labs Lab 08/09/14 1937 08/10/14 0317 08/11/14 0235 08/12/14 0504  WBC  --  12.6* 12.8* 7.7  NEUTROABS 6.9 11.7*  --   --   HGB  --  12.3 12.0 12.5  HCT  --  36.4 36.2 37.0  MCV  --  83.9 85.2 86.2  PLT  --  301 319 317   Cardiac Enzymes:  Recent Labs Lab 08/09/14 1937 08/10/14 0100 08/10/14 0700  TROPONINI <0.30 <0.30 <0.30   D-Dimer:  Recent Labs Lab 08/10/14 0935  DDIMER <0.27   Hemoglobin A1C:  Recent Labs Lab 08/10/14 0100    HGBA1C 4.6   Coagulation:  Recent Labs Lab 08/09/14 1937 08/11/14 0235  LABPROT 14.2 14.5  INR 1.08 1.12     Urinalysis:  Recent Labs Lab 08/09/14 2035  COLORURINE YELLOW  LABSPEC 1.003*  PHURINE 5.5  GLUCOSEU NEGATIVE  HGBUR TRACE*  BILIRUBINUR NEGATIVE  KETONESUR NEGATIVE  PROTEINUR NEGATIVE  UROBILINOGEN 0.2  NITRITE NEGATIVE  LEUKOCYTESUR NEGATIVE     Micro Results: Recent Results (from the past 240 hour(s))  Urine culture     Status: None   Collection Time: 08/09/14  8:35 PM  Result Value Ref Range Status   Specimen Description URINE, CLEAN CATCH  Final   Special Requests NONE  Final   Culture  Setup Time   Final    08/09/2014 23:09 Performed at [facility]  [place] Count   Final    40,000 COLONIES/ML Performed at [facility]    Culture   Final    DIPHTHEROIDS(CORYNEBACTERIUM SPECIES) Note: Standardized susceptibility testing for this organism is not available. Performed at [facility]    Report Status 08/11/2014 FINAL  Final  Blood Culture (routine x 2)     Status: None (Preliminary result)   Collection Time: 08/09/14  8:42 PM  Result Value Ref Range Status   Specimen Description BLOOD RIGHT ARM  Final   Special Requests BOTTLES DRAWN AEROBIC AND ANAEROBIC 10 CC  Final   Culture  Setup Time   Final    08/10/2014 04:48 Performed at [facility]    Culture   Final           BLOOD CULTURE RECEIVED NO GROWTH TO DATE CULTURE WILL BE HELD FOR 5 DAYS BEFORE ISSUING A FINAL NEGATIVE REPORT Performed at [facility]    Report Status PENDING  Incomplete  Blood Culture (routine x 2)     Status: None (Preliminary result)   Collection Time: 08/09/14  9:00 PM  Result Value Ref Range Status   Specimen Description BLOOD RIGHT HAND  Final   Special Requests BOTTLES DRAWN AEROBIC ONLY 10 CC  Final   Culture  Setup Time   Final    08/10/2014 04:48 Performed at [facility]    Culture   Final            BLOOD CULTURE RECEIVED NO GROWTH TO DATE CULTURE WILL BE HELD FOR 5 DAYS BEFORE ISSUING A FINAL NEGATIVE REPORT Performed at [facility]    Report Status PENDING  Incomplete  Respiratory virus panel (routine influenza)     Status: Abnormal   Collection Time: 08/09/14  9:32 PM  Result Value Ref Range Status   Source - RVPAN RESP  Final   Respiratory Syncytial Virus A NOT DETECTED  Final   Respiratory Syncytial Virus B NOT DETECTED  Final   Influenza A NOT DETECTED  Final   Influenza B NOT DETECTED  Final   Parainfluenza 1 NOT DETECTED  Final   Parainfluenza 2 NOT DETECTED  Final   Parainfluenza 3 NOT DETECTED  Final   Metapneumovirus NOT DETECTED  Final   Rhinovirus DETECTED (A)  Final   Adenovirus NOT DETECTED  Final   Influenza A H1 NOT DETECTED  Final   Influenza A H3 NOT DETECTED  Final    Comment: (NOTE)       Normal Reference Range for each Analyte: NOT DETECTED Testing performed using the Luminex xTAG Respiratory Viral Panel test kit. The analytical performance characteristics of this assay have been determined by [facility].  The modifications have not been cleared or approved by the FDA. This assay has been validated pursuant to the CLIA regulations and is used for clinical purposes. Performed at [place] PCR Screening     Status: None   Collection Time: 08/10/14 12:12 AM  Result Value Ref Range Status   MRSA by PCR NEGATIVE NEGATIVE Final    Comment:        The GeneXpert MRSA Assay (FDA approved for NASAL specimens only), is one component of a comprehensive MRSA colonization surveillance program. It is not intended to diagnose MRSA infection nor to guide or monitor treatment for MRSA infections.   Culture, expectorated sputum-assessment     Status: None   Collection Time: 08/10/14  4:10 AM  Result  Value Ref Range Status   Specimen Description SPUTUM  Final   Special Requests NONE  Final   Sputum evaluation   Final     THIS SPECIMEN IS ACCEPTABLE. RESPIRATORY CULTURE REPORT TO FOLLOW.   Report Status 08/10/2014 FINAL  Final  Culture, respiratory (NON-Expectorated)     Status: None (Preliminary result)   Collection Time: 08/10/14  4:10 AM  Result Value Ref Range Status   Specimen Description SPUTUM  Final   Special Requests NONE  Final   Gram Stain   Final    MODERATE WBC PRESENT,BOTH PMN AND MONONUCLEAR FEW SQUAMOUS EPITHELIAL CELLS PRESENT FEW GRAM POSITIVE COCCI IN PAIRS IN CHAINS IN CLUSTERS FEW GRAM VARIABLE ROD Performed at [facility]    Culture   Final    NORMAL OROPHARYNGEAL FLORA Performed at [facility]    Report Status PENDING  Incomplete   Medications: I have reviewed the patient's current medications.  Scheduled Meds: . amoxicillin-clavulanate  500 mg Oral 3 times per day  . azithromycin  250 mg Oral QHS  . ipratropium-albuterol  3 mL Nebulization TID  . predniSONE  40 mg Oral Q breakfast  . Rivaroxaban  15 mg Oral BID WC  . [START ON 09/02/2014] rivaroxaban  20 mg Oral Q supper  . sodium chloride  3 mL Intravenous Q12H   Assessment/Plan: Principal Problem:   Multiple pulmonary emboli Active Problems:   CAP (community acquired pneumonia)   Hypokalemia   Elevated LFTs   Pulmonary embolism   Abnormal chest CT   Asthma exacerbation   Sepsis  Provoked, uncomplicated pulmonary embolisms seen on CTA:  D dimer <0.27. Antithrombin III, Normal- 102, protein C activity- 122, Protein S activity- 79, both total- pending, Lupus anticoagulant- pending, Homocysteine- WNL- 4.7.  - Other tests- B2 glycoprotein, Factor 5 leiden, lupus anticoag pending, follow up on discharge - Pt started on Xarelto yesterday,  $RemoveBef'15mg'[id number]$  BID x 21 days, then $RemoveBe'20mg'jQDoYXtmP$  QD  - Pt to pick up 1 month free xalreto in clinic, and come to clinic to complete paper work to get the rest for a year.  - LE dopplers ordered- NO DVT, no bakers Cyst.  - Pt to follow up in 2 weeks in [facility] clinic. Message sent to  front desk. Pt advised to call clinic number in 2 weeks if not called with an appointment. - Pt advised to avoid NSAIDS.  Community Acquired Pneumonia: Meets SIRS criteria: fever to 100.8, tachycardic, and leukocytosis 12.6. Lactic acid 6.46-->4.6-->1.5. Anion gap 17-->13. Patchy infiltrate in LLL and RML.  - Pt got Azithromycin 500 mg and Ceftriaxone 1 g IV for 2 days, transitioned to PO 08/11/2014 (azithro and augmentin)n to complete a total of 7 days of antibiotics. - RSV panel- Positive rhino Virus, - Blood cultures - No growth >> - Sputum culture prelim- Normal oropharyngeal flora, final results pending >>  Asthma:  moderate persistent asthma, will prescribe long acting steroid inhaler that is affordable along with her albuterol inhaler at discharge. - Duonebs q4 hours - pred 40 x 5 days then taper, day 3  Tobacco and Marijuana Abuse:  - denies cessation counseling at this time, states she has not smoked in a few days and is going to stop smoking now  Diet:  - regular  DVT Ppx:  - On xalrelto  Dispo: Disposition is deferred at this time, awaiting improvement of current medical problems. Anticipated discharge in approximately 1-2 day(s).   The patient does not have a current PCP (No  Pcp Per Patient) and does not need an [facility] hospital follow-up appointment after discharge. Can follow up in our clinic.   The patient does not have transportation limitations that hinder transportation to clinic appointments.   .Services Needed at time of discharge: Y = Yes, Blank = No PT:   OT:   RN:   Equipment:   Other:     LOS: 3 days   Bethena Roys, MD 08/12/2014, 10:07 AM

## 2014-08-12 NOTE — Progress Notes (Signed)
NURSING PROGRESS NOTE  Kara EatonShanequia J Haynes [id number] Discharge Data: 08/12/2014 12:34 PM Attending Provider: Levert FeinsteinJames M Granfortuna, MD PCP:No PCP Per Patient     [name] to be D/C'd Home per MD order.  Discussed with the patient the After Visit Summary and all questions fully answered. All IV's discontinued with no bleeding noted. All belongings returned to patient for patient to take home.   Last Vital Signs:  Blood pressure 153/88, pulse 80, temperature 98.1 F (36.7 C), temperature source Oral, resp. rate 18, height 5\' 2"  (1.575 m), weight 94.9 kg (209 lb 3.5 oz), last menstrual period 07/26/2014, SpO2 97 %.  Discharge Medication List   Medication List    TAKE these medications        albuterol 108 (90 BASE) MCG/ACT inhaler  Commonly known as:  PROVENTIL HFA;VENTOLIN HFA  Inhale 2 puffs into the lungs every 2 (two) hours as needed for wheezing or shortness of breath (cough).     Albuterol Sulfate 108 (90 BASE) MCG/ACT Aepb  Inhale 1-2 puffs into the lungs every 6 (six) hours as needed.     amoxicillin-clavulanate 500-125 MG per tablet  Commonly known as:  AUGMENTIN  Take 1 tablet (500 mg total) by mouth every 8 (eight) hours.     azithromycin 250 MG tablet  Commonly known as:  ZITHROMAX  Take one tablet once a day for 3 more days.     predniSONE 20 MG tablet  Commonly known as:  DELTASONE  Take 2 tablets (40 mg total) by mouth daily with breakfast. 40mg  for 1 more day, 20mg  for 3 days, then 10mg  for 3 days.     Rivaroxaban 15 MG Tabs tablet  Commonly known as:  XARELTO  Take 1 tablet (15 mg total) by mouth 2 (two) times daily with a meal. For 20 more days.     rivaroxaban 20 MG Tabs tablet  Commonly known as:  XARELTO  Take 1 tablet (20 mg total) by mouth daily with supper. Start after you complete the 15mg  tablets.  Start taking on:  09/02/2014

## 2014-08-13 NOTE — Discharge Summary (Signed)
Name: Kara Haynes MRN: [id number] DOB: 05-28-1991 23 y.o. PCP: No Pcp Per Patient  Date of Admission: 08/09/2014  3:10 PM Date of Discharge: 08/13/2014 Attending Physician: Dr. Cyndie Chime Discharge Diagnosis: Principal Problem:   Multiple pulmonary emboli Active Problems:   CAP (community acquired pneumonia)   Hypokalemia   Elevated LFTs   Pulmonary embolism   Abnormal chest CT   Asthma exacerbation   Sepsis  Discharge Medications:   Medication List    TAKE these medications        albuterol 108 (90 BASE) MCG/ACT inhaler  Commonly known as:  PROVENTIL HFA;VENTOLIN HFA  Inhale 2 puffs into the lungs every 2 (two) hours as needed for wheezing or shortness of breath (cough).     Albuterol Sulfate 108 (90 BASE) MCG/ACT Aepb  Inhale 1-2 puffs into the lungs every 6 (six) hours as needed.     amoxicillin-clavulanate 500-125 MG per tablet  Commonly known as:  AUGMENTIN  Take 1 tablet (500 mg total) by mouth every 8 (eight) hours.     azithromycin 250 MG tablet  Commonly known as:  ZITHROMAX  Take one tablet once a day for 3 more days.     predniSONE 20 MG tablet  Commonly known as:  DELTASONE  Take 2 tablets (40 mg total) by mouth daily with breakfast. 40mg  for 1 more day, 20mg  for 3 days, then 10mg  for 3 days.     Rivaroxaban 15 MG Tabs tablet  Commonly known as:  XARELTO  Take 1 tablet (15 mg total) by mouth 2 (two) times daily with a meal. For 20 more days.     rivaroxaban 20 MG Tabs tablet  Commonly known as:  XARELTO  Take 1 tablet (20 mg total) by mouth daily with supper. Start after you complete the 15mg  tablets.  Start taking on:  09/02/2014        Disposition and follow-up:   Kara Haynes was discharged from [facility] in [place] condition.  At the hospital follow up visit please address:  1.  Please ensure patient is able to get xarelto medication. She was working w/ SW while admitted to get paperwork  filled out for this.   2.  Labs / imaging needed at time of follow-up: Lupus anticoagulant ABNORMAL, please f/u  3.  Pending labs/ test needing follow-up: none  Follow-up Appointments:  Dr. Yetta Barre at 9:45am w/ [facility] clinic. Called and left patient a voicemail informing her of this appointment.   Discharge Instructions:     Discharge Instructions    Diet - low sodium heart healthy    Complete by:  As directed      Discharge instructions    Complete by:  As directed   You will be called with an appointment from the clinic in 1 week, to follow up with [place] in 2 weeks in clinic.   Take 15mg  of Xalrelto twice a day for 20 more days. Then you will continue with the 20mg  pill once a day till we tell you to stop.   Please stop taking medications like Ibuprofen, Naproxen, Aspirin, Goody powders, Advil or aleve. This is very important as these can increase your risk of have bleeds as you are on a  blood thinner.   Also do not involve in activities that will increase your risk of falling like climbing ladders.     Increase activity slowly    Complete by:  As directed  Consultations:    Procedures Performed:  Dg Chest 2 View (if Patient Has Fever And/or Copd)  08/09/2014   CLINICAL DATA:  Acute chest pain and shortness of breath  EXAM: CHEST  2 VIEW  COMPARISON:  11/28/2011  FINDINGS: Normal heart size and vascularity. Minor streaky basilar atelectasis. No definite pneumonia, collapse or consolidation. Negative for edema, effusion or pneumothorax. Trachea midline. No acute osseous finding.  IMPRESSION: Minor bibasilar atelectasis.  No acute chest process.   Electronically Signed   By: Ruel Favorsrevor  Shick M.D.   On: 08/09/2014 16:32   Ct Angio Chest Pe W/cm &/or Wo Cm  08/09/2014   CLINICAL DATA:  One day history of chest pain and difficulty breathing  EXAM: CT ANGIOGRAPHY CHEST WITH CONTRAST  TECHNIQUE: Multidetector CT imaging of the chest was performed using the standard protocol during  bolus administration of intravenous contrast. Multiplanar CT image reconstructions and MIPs were obtained to evaluate the vascular anatomy.  CONTRAST:  80mL OMNIPAQUE IOHEXOL 350 MG/ML SOLN  COMPARISON:  Chest radiograph August 09, 2014  FINDINGS: There are several small subsegmental pulmonary emboli in left lower lobe pulmonary artery branches. No larger pulmonary emboli are identified. There is no evidence of right heart strain.  There is no thoracic aortic aneurysm or dissection.  There is patchy infiltrate in the left lower lobe, primarily in the medial segment region. There is also patchy infiltrate in the medial aspect of the lateral segment of the right middle lobe. There is more subtle patchy infiltrate in the posterior segment of the left upper lobe. There is a nodular opacity in the posterior segment of the left upper lobe measuring 1.1 x 0.9 cm. On axial slice 42 series 506, a second nodular opacity is noted in the periphery of the superior segment of the left lower lobe measuring 0.5 x 0.5 cm.  There is no appreciable thoracic adenopathy. Thymic tissue is present, normal for age. Pericardium is not thickened.  In the visualized upper abdomen there is hepatic steatosis. There are no blastic or lytic bone lesions. Visualized thyroid appears normal.  Review of the MIP images confirms the above findings.  IMPRESSION: Several small peripheral pulmonary emboli in the left lower lobe without right heart strain. Areas of infiltrate bilaterally. Nodular opacities, largest measuring 1.1 x 0.9 cm in the left upper lobe. Given this finding, a followup study in 3 months advised to further assess. No appreciable adenopathy. Hepatic steatosis present.  Critical Value/emergent results were called by telephone at the time of interpretation on 08/09/2014 at 9:36 pm to [facility] PAC , who verbally acknowledged these results.   Electronically Signed   By: Bretta BangWilliam  Woodruff M.D.   On: 08/09/2014 21:37      Admission HPI: Kara Haynes is a 23 yo woman with a history of asthma who is presenting with a recent fever and cold with acute shortness of breath and chest pain early this morning. She noticed a cough productive of yellow sputum several days ago along with fever after spending time with a sick contact. Then this morning, she had acute shortness of breath upon sitting up in bed. She tried walking to her closet, and again was short of breath. She used her albuterol inhaler, then had the sudden onset of 10/10, sharp, pleuritic chest pain substernally. The pain did not move and subsided once she was in the ED and received an albuterol nebulizer.   With regard to her asthma history, she typically has 1-2 exacerbations per year, often  triggered by pollen or weather changes. She uses an albuterol inhaler at home but did not fill her beclomethasone prescription due to its expense. She has never been intubated.  In the ED, she was not improving with breathing treatments, so a CTA was performed. It was significant for several small peripheral pulmonary emboli in the LLL and areas of infiltrate bilaterally. Of note, she is a current cigarette smoker with a 1 pack year history of smoking. 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   Hospital Course by problem list: Principal Problem:   Multiple pulmonary emboli Active Problems:   CAP (community acquired pneumonia)   Hypokalemia   Elevated LFTs   Pulmonary embolism   Abnormal chest CT   Asthma exacerbation   Sepsis   Provoked, uncomplicated pulmonary embolisms: Patient was tachycardic and tachypnic, but never hypoxic. Her breathing has improved and she is not currently in pain. Negative urine pregnancy, negative UDS for IVDU, no recent immobility, no OCP medications, no known  malignancy. Patient does have a current infection, obesity and cigarette smoking as risk factors. Troponins negative x3 (including i-stat). D dimer <0.27. Antithrombin III, Normal- 102, protein C activity- 122, Protein S activity- 79, Protein C total- low, Lupus anticoagulant- abnormal, Homocysteine- WNL- 4.7. LE dopplers negative. Heparin drip was initiated in the ED 12 mL/hr continuous IV and transitioned to xarelto 15mg  BID x 21 days then 20mg  qd.   Community Acquired Pneumonia: Meets SIRS criteria: fever to 100.8, tachycardic, and leukocytosis 12.6. Lactic acid 6.46-->4.6-->1.5. Anion gap 17-->13. Patchy infiltrate in LLL and RML. Pt got Azithromycin 500 mg and Ceftriaxone 1 g IV for 2 days, transitioned to PO 08/11/2014 (azithro and augmentin)n to complete a total of 7 days of antibiotics.RSV panel- Positive rhino Virus. Blood cultures negative. Sputum culture revealed normal oropharyngeal flora.  Asthma: Cough, wheezing, shortness of breath. Was once on albuterol and qvar, but could not afford the qvar. Classified as moderate persistent asthma. Had diffuse wheezes on admission and may have been having an asthma exacerbation on top of her CAP and PEs, but wheezing is diminished with albuterol treatments.  Patient received solu-medrol in ED. Given prednisone 40mg  x 5 days w/ taper.   Nodular Opacities Visualized on CTA: 1.1 x 0.9 cm in LUL. Could be post-inflammatory / infectious nodules. There was no adenopathy. Follow up study (PET or CT) recommended per radiology in 3 months  Discharge Vitals:   BP 153/88 mmHg  Pulse 80  Temp(Src) 98.1 F (36.7 C) (Oral)  Resp 18  Ht 5\' 2"  (1.575 m)  Wt 209 lb 3.5 oz (94.9 kg)  BMI 38.26 kg/m2  SpO2 97%  LMP 07/26/2014 (Approximate)  Discharge Labs:  Results for Kara Haynes, Kara Haynes (MRN [id number]) as of 08/17/2014 08:51  Ref. Range 08/12/2014 05:04  WBC Latest Range: 4.0-10.5 K/uL 7.7  RBC Latest Range: 3.87-5.11 MIL/uL 4.29  Hemoglobin Latest  Range: 12.0-15.0 g/dL 40.912.5  HCT Latest Range: 36.0-46.0 % 37.0  MCV Latest Range: 78.0-100.0 fL 86.2  MCH Latest Range: 26.0-34.0 pg 29.1  MCHC Latest Range: 30.0-36.0 g/dL 81.133.8  RDW  Latest Range: 11.5-15.5 % 13.8  Platelets Latest Range: 150-400 K/uL 317  Results for Kara Haynes, Kara Haynes (MRN [id number]) as of 08/17/2014 08:51  Ref. Range 08/11/2014 02:35  Sodium Latest Range: 137-147 mEq/L 139  Potassium Latest Range: 3.7-5.3 mEq/L 4.1  Chloride Latest Range: 96-112 mEq/L 101  CO2 Latest Range: 19-32 mEq/L 22  BUN Latest Range: 6-23 mg/dL 12  Creatinine Latest Range: 0.50-1.10 mg/dL 1.61  Calcium Latest Range: 8.4-10.5 mg/dL 9.0  GFR calc non Af Amer Latest Range: >90 mL/min 85 (L)  GFR calc Af Amer Latest Range: >90 mL/min >90  Glucose Latest Range: 70-99 mg/dL 83  Anion gap Latest Range: 5-15  16 (H)   Signed: Gara Kroner, MD 08/13/2014, 7:16 AM    Services Ordered on Discharge: none Equipment Ordered on Discharge: none

## 2014-08-14 LAB — FACTOR 5 LEIDEN

## 2014-08-14 LAB — PROTEIN S, TOTAL: Protein S Ag, Total: 77 % (ref 60–150)

## 2014-08-14 LAB — PROTHROMBIN GENE MUTATION

## 2014-08-14 LAB — PROTEIN C, TOTAL: Protein C, Total: 66 % — ABNORMAL LOW (ref 72–160)

## 2014-08-15 LAB — BETA-2-GLYCOPROTEIN I ABS, IGG/M/A
BETA-2-GLYCOPROTEIN I IGA: 2 A Units (ref <20)
BETA-2-GLYCOPROTEIN I IGM: 4 M Units (ref <20)
Beta-2 Glyco I IgG: 6 G Units (ref <20)

## 2014-08-15 LAB — CARDIOLIPIN ANTIBODIES, IGG, IGM, IGA
ANTICARDIOLIPIN IGG: 6 GPL U/mL — AB (ref <23)
Anticardiolipin IgA: 7 APL U/mL — ABNORMAL LOW (ref <22)
Anticardiolipin IgM: 0 MPL U/mL — ABNORMAL LOW (ref <11)

## 2014-08-16 LAB — CULTURE, BLOOD (ROUTINE X 2)
CULTURE: NO GROWTH
Culture: NO GROWTH

## 2014-08-17 ENCOUNTER — Ambulatory Visit: Payer: Self-pay | Admitting: Internal Medicine

## 2014-08-18 ENCOUNTER — Encounter: Payer: Self-pay | Admitting: Internal Medicine

## 2014-08-30 ENCOUNTER — Ambulatory Visit: Payer: Self-pay | Admitting: Internal Medicine

## 2014-09-05 ENCOUNTER — Ambulatory Visit (INDEPENDENT_AMBULATORY_CARE_PROVIDER_SITE_OTHER): Payer: Self-pay | Admitting: Internal Medicine

## 2014-09-05 ENCOUNTER — Encounter: Payer: Self-pay | Admitting: Internal Medicine

## 2014-09-05 VITALS — BP 96/57 | HR 63 | Ht 62.0 in | Wt 213.1 lb

## 2014-09-05 DIAGNOSIS — I2699 Other pulmonary embolism without acute cor pulmonale: Secondary | ICD-10-CM

## 2014-09-05 MED ORDER — RIVAROXABAN 20 MG PO TABS: 20.0000 mg | ORAL_TABLET | Freq: Every day | ORAL | Status: DC

## 2014-09-05 NOTE — Assessment & Plan Note (Addendum)
Patient diagnosed w/ provoked PE during recent hospital admission, discharged on Rx for Xarelto. CT angio findings showed several small peripheral pulmonary emboli in LLL w/out right heart strain. Patient has been taking Xarelto 15 mg bid since her discharge but says she ran out 3 days ago and has not been able to acquire more Xarelto d/t cost. Does not have insurance currently. Had [facility] the [facility] but says she lost the paperwork. The patient denies any shortness of breath, chest pain, cough, palpitations, fever, chills, LE swelling or pain.  -Discussed w/ Dr. Alexandria LodgeGroce, obtained 1 month supply of Xarelto 20 mg daily -Will need to get orange card or apply for MAP at department of health for further coverage for Xarelto; will need a total of 6 months. -Will send message to Lynnae JanuaryShana Grady, CSW for further assistance as well.  -Will also need follow up CT scan in 2 months to further assess nodular opacities also seen on CT angio. -RTC in 2 weeks for follow up

## 2014-09-05 NOTE — Progress Notes (Signed)
   Subjective:   Patient ID: Kara Haynes female   DOB: 29-Oct-1990 23 y.o.   MRN: [id number]  HPI: Ms. Kara Haynes is a 23 y.o. female w/ PMHx of Asthma and tobacco abuse, presents to the clinic today for a hospital follow-up visit after patient was found to have pneumonia and provoked PE thought to be 2/2 infection (CAP). Anti-phospholipid antibodies and lupus anticoagulant not detected. Protein C and S activity normal. The patient was discharged on Xarelto 15 mg bid for 21 days and was to continue 20 mg daily from that point forward. The patient took 1 month of Xarelto (15 mg bid) and has since run out. She claims she has not taken the medicine for the past 3 days. She had attempted to get more from the pharmacy but her 30 day free trial had ended and she could not afford further medication. She currently has no insurance.  Today, she has no significant complaints. She denies SOB, chest pain, cough, fever, chills, vomiting, dizziness, lightheadedness, leg pain or swelling. She claims she has been doing very well since her hospital discharge.   Past Medical History  Diagnosis Date  . Asthma   . Tobacco abuse   . Seasonal allergies   . UTI (lower urinary tract infection)   . Obesity    Current Outpatient Prescriptions  Medication Sig Dispense Refill  . albuterol (PROVENTIL HFA;VENTOLIN HFA) 108 (90 BASE) MCG/ACT inhaler Inhale 2 puffs into the lungs every 2 (two) hours as needed for wheezing or shortness of breath (cough). 1 Inhaler 0  . Albuterol Sulfate 108 (90 BASE) MCG/ACT AEPB Inhale 1-2 puffs into the lungs every 6 (six) hours as needed. 1 each 2  . rivaroxaban (XARELTO) 20 MG TABS tablet Take 1 tablet (20 mg total) by mouth daily with supper. Start after you complete the 15mg  tablets. 30 tablet 4   No current facility-administered medications for this visit.    Review of Systems: General: Denies fever, chills, diaphoresis, appetite change and fatigue.    Respiratory: Denies SOB, DOE, cough, and wheezing.   Cardiovascular: Denies chest pain and palpitations.  Gastrointestinal: Denies nausea, vomiting, abdominal pain, and diarrhea.  Genitourinary: Denies dysuria, increased frequency, and flank pain. Endocrine: Denies hot or cold intolerance, polyuria, and polydipsia. Musculoskeletal: Denies myalgias, back pain, joint swelling, arthralgias and gait problem.  Skin: Denies pallor, rash and wounds.  Neurological: Denies dizziness, seizures, syncope, weakness, lightheadedness, numbness and headaches.  Psychiatric/Behavioral: Denies mood changes, and sleep disturbances.  Objective:   Physical Exam: Filed Vitals:   09/05/14 0908  BP: 96/57  Pulse: 63  Height: 5\' 2"  (1.575 m)  Weight: 213 lb 1.6 oz (96.662 kg)  SpO2: 100%    General: Overweight AA female, alert, cooperative, NAD. HEENT: PERRL, EOMI. Moist mucus membranes Neck: Full range of motion without pain, supple, no lymphadenopathy or carotid bruits Lungs: Clear to ascultation bilaterally, normal work of respiration, no wheezes, rales, rhonchi Heart: RRR, no murmurs, gallops, or rubs Abdomen: Soft, non-tender, non-distended, BS + Extremities: No cyanosis, clubbing, or edema Neurologic: Alert & oriented X3, cranial nerves II-XII intact, strength grossly intact, sensation intact to light touch   Assessment & Plan:   Please see problem based assessment and plan.

## 2014-09-05 NOTE — Patient Instructions (Addendum)
General Instructions:  1. Please schedule a follow up appointment for 2 weeks.   2. Please take all medications as prescribed. Take Xarelto 20 mg daily.   3. If you have worsening of your symptoms or new symptoms arise, please call the clinic [phone]), or go to the ER immediately if symptoms are severe.  Please bring your medicines with you each time you come to clinic.  Medicines may include prescription medications, over-the-counter medications, herbal remedies, eye drops, vitamins, or other pills.   Progress Toward Treatment Goals:  Treatment Goal 09/05/2014  Stop smoking smoking the same amount    Self Care Goals & Plans:  Self Care Goal 09/05/2014  Manage my medications take my medicines as prescribed; bring my medications to every visit; refill my medications on time  Eat healthy foods drink diet soda or water instead of juice or soda; eat more vegetables; eat foods that are low in salt; eat baked foods instead of fried foods; eat fruit for snacks and desserts  Meeting treatment goals maintain the current self-care plan    Care Management & Community Referrals:  Referral 09/05/2014  Referrals made for care management support none needed  Referrals made to community resources none

## 2014-09-06 NOTE — Progress Notes (Signed)
Internal Medicine Clinic Attending  Case discussed with Dr. Jones soon after the resident saw the patient.  We reviewed the resident's history and exam and pertinent patient test results.  I agree with the assessment, diagnosis, and plan of care documented in the resident's note. 

## 2014-09-12 ENCOUNTER — Telehealth: Payer: Self-pay | Admitting: Licensed Clinical Social Worker

## 2014-09-12 NOTE — Telephone Encounter (Signed)
Ms. Kara Haynes was referred to CSW for assistance with Xarelto manufacturer assistance program.  Physician portion was initiated and placed in Dr. Donnetta HutchingMullen's mailbox.  CSW forwarded form to attending as Manufacturer PAP often has difficulty processing applications with residents information.  CSW attempted to contact pt to inform of application for the Xarelto program and information needed for application.  Phone number on the chart is to pt's grandmother.  Grandmother working when CSW called, grandmother states she will call CSW back with working phone number for Kara Haynes.  CSW will place application in the mail as to not delay.

## 2014-09-19 ENCOUNTER — Ambulatory Visit: Payer: Self-pay | Admitting: Internal Medicine

## 2014-09-20 ENCOUNTER — Ambulatory Visit (INDEPENDENT_AMBULATORY_CARE_PROVIDER_SITE_OTHER): Payer: Self-pay | Admitting: Internal Medicine

## 2014-09-20 ENCOUNTER — Encounter: Payer: Self-pay | Admitting: Internal Medicine

## 2014-09-20 ENCOUNTER — Encounter: Payer: Self-pay | Admitting: Licensed Clinical Social Worker

## 2014-09-20 VITALS — BP 119/79 | HR 81 | Temp 98.2°F | Ht 61.0 in | Wt 212.5 lb

## 2014-09-20 DIAGNOSIS — I2699 Other pulmonary embolism without acute cor pulmonale: Secondary | ICD-10-CM

## 2014-09-20 NOTE — Progress Notes (Signed)
   Subjective:    Patient ID: Kara Haynes, female    DOB: 04-Apr-1991, 24 y.o.   MRN: [id number]  HPI Kara Haynes is a 24 year old woman with a history of asthma, tobacco abuse, and provoked PE on Xarelto.  She was hospitalized from 09/08/14 through 09/12/14 with pneumonia and provoked PE.  She is currently on Xarelto.  She was last see on 09/05/14 when she had ran out of her medication, and she was given a 1 month sample.  She says that she hasn't talked to the social worker about getting the medication in the future.  She says that she has 15 pills left.  She reports having her menstrual cycle last week, and she has noticed that her bleeding has been more than usual since starting Xarelto.  She also reports having worse cramps than usual, and she was wondering if she could take naproxen to help with the pain.  She is also wondering if she can drink alcohol occasionally while on Xarelto.  She denies shortness of breath, cough, chest pain, fever, chills, vomiting, dizziness, leg swelling or pain, or bleeding.  Review of Systems  Constitutional: Negative for fever, chills, diaphoresis and fatigue.  HENT: Negative for congestion, rhinorrhea and sore throat.   Eyes: Negative for visual disturbance.  Respiratory: Negative for cough, shortness of breath and wheezing.   Cardiovascular: Negative for chest pain, palpitations and leg swelling.  Gastrointestinal: Negative for nausea, vomiting, abdominal pain, diarrhea and constipation.  Genitourinary: Negative for difficulty urinating.  Musculoskeletal: Negative for myalgias and arthralgias.  Skin: Negative for rash.  Neurological: Negative for dizziness, weakness and numbness.       Objective:   Physical Exam  Constitutional: She is oriented to person, place, and time. She appears well-developed and well-nourished. No distress.  HENT:  Head: Normocephalic and atraumatic.  Eyes: Conjunctivae and EOM are normal. Pupils  are equal, round, and reactive to light. No scleral icterus.  Cardiovascular: Normal rate, regular rhythm and normal heart sounds.   Pulmonary/Chest: Effort normal and breath sounds normal. No respiratory distress. She has no wheezes.  Abdominal: Soft. Bowel sounds are normal. She exhibits no distension. There is no tenderness.  Musculoskeletal: Normal range of motion. She exhibits no edema or tenderness.  Neurological: She is alert and oriented to person, place, and time. No cranial nerve deficit. She exhibits normal muscle tone.  Skin: Skin is warm and dry. No rash noted.          Assessment & Plan:  Please see problem-based assessment and plan.

## 2014-09-20 NOTE — Patient Instructions (Signed)
Thank you for coming to clinic today Kara Haynes.  General instructions: -Try to take Tylenol first for your menstrual cramping, if that doesn't work it is okay to take naproxen. -Let us know if you have any new bleeding or feel light-headed or weak. -Please call our clinic in 10 days if you are unable to get he Xarelto, we will need to have you come in to get you a sample. -Otherwise, please make a follow up appointment to return to clinic in 2 months to repeat your CT scan and check your blood work.  Thank you for bringing your medicines today. This helps us keep you safe from mistakes.

## 2014-09-20 NOTE — Progress Notes (Signed)
Internal Medicine Clinic Attending  Case discussed with Dr. Moding at the time of the visit.  We reviewed the resident's history and exam and pertinent patient test results.  I agree with the assessment, diagnosis, and plan of care documented in the resident's note. 

## 2014-09-20 NOTE — Assessment & Plan Note (Signed)
No signs or symptoms of recurrent PE.  Currently taking Xarelto with increased menstrual volume and cramping, but no other bleeding.  CTA during hospitalization showed nodular opacities in LUL with recommended follow up in 3 months.  She was also noted to have elevated lupus anticoagulant during the hospitalization, though this appeared to be when she was on heparin.  Xarelto also appears to lead to false positive results, so repeat testing will need to be done once she completes her anticoagulation or during a brief interruption in therapy.  Discussed using naproxen and alcohol while on Xarelto with Dr. Elie Confer who felt that both would be okay in moderation. -Patient met with Edwena Blow and completed paperwork for the medication assistance program. -Dr. Lynnae January completed the physician portion of the paperwork. -Edwena Blow will fax in and start the approval process. -Told Ms. Wright-Madkins to call and schedule an appointment in 10 days if she is unable to receive the Xarelto so we can try to get additional samples from Dr. Elie Confer. -Otherwise, return to clinic in 2 months for follow up and to schedule repeat CT. -Repeat lupus anticoagulant testing after completion of Xarelto. -Discussed elevated risk of naproxen and alcohol use while on Xarelto with the patient and emphasized moderation.  Told her to try Tylenol first for menstrual cramping, but naproxen likely okay.

## 2014-09-29 NOTE — Progress Notes (Signed)
CSW met with Kara Haynes during her scheduled [facility] appointment.  Pt has yet to receive information from [employer] regarding the Xarelto PAP program.  CSW provided [name] with copy of mailed letter, pt signed PAP application and faxed in to Xarelto PAP.  CSW noted pt will need to provide financial information to complete application.   09/29/2014: CSW received message from [name] has been unable to obtain her financial information.

## 2014-10-04 ENCOUNTER — Telehealth: Payer: Self-pay | Admitting: *Deleted

## 2014-10-04 NOTE — Telephone Encounter (Signed)
Pt will bring paperwork in tomorrow AM [facility]

## 2014-10-04 NOTE — Telephone Encounter (Signed)
Pt called stating she has had car trouble and has not been able to get us her paperwork.  She now has income tax paperwork so she can get medicaid. She only has 2 xarelto 20 mg tabs  left.  Last visit 1/6  Called Dr Alexandria LodgeGroce and he is not available but suggested Shana-CSW or Sharon-AD call the rep for Xarelto.  Pt # [phone]

## 2014-10-04 NOTE — Telephone Encounter (Signed)
Pt called and left message to call her back. I returned the call and no answer.  I left message to call clinic.

## 2014-10-04 NOTE — Telephone Encounter (Signed)
Returned pt call. No answer.

## 2014-10-05 ENCOUNTER — Ambulatory Visit (INDEPENDENT_AMBULATORY_CARE_PROVIDER_SITE_OTHER): Payer: Self-pay | Admitting: Internal Medicine

## 2014-10-05 ENCOUNTER — Encounter: Payer: Self-pay | Admitting: *Deleted

## 2014-10-05 VITALS — BP 127/83 | HR 80 | Temp 98.0°F | Ht 61.0 in | Wt 214.4 lb

## 2014-10-05 DIAGNOSIS — N921 Excessive and frequent menstruation with irregular cycle: Secondary | ICD-10-CM

## 2014-10-05 DIAGNOSIS — I2699 Other pulmonary embolism without acute cor pulmonale: Secondary | ICD-10-CM

## 2014-10-05 LAB — CBC
HEMATOCRIT: 38.6 % (ref 36.0–46.0)
HEMOGLOBIN: 12.5 g/dL (ref 12.0–15.0)
MCH: 28.5 pg (ref 26.0–34.0)
MCHC: 32.4 g/dL (ref 30.0–36.0)
MCV: 87.9 fL (ref 78.0–100.0)
MPV: 9.5 fL (ref 8.6–12.4)
PLATELETS: 331 10*3/uL (ref 150–400)
RBC: 4.39 MIL/uL (ref 3.87–5.11)
RDW: 14.1 % (ref 11.5–15.5)
WBC: 4.8 10*3/uL (ref 4.0–10.5)

## 2014-10-05 MED ORDER — RIVAROXABAN 20 MG PO TABS: 20.0000 mg | ORAL_TABLET | Freq: Every day | ORAL | Status: DC

## 2014-10-05 NOTE — Progress Notes (Signed)
Patient ID: Kara Haynes, female   DOB: 1991/09/02, 24 y.o.   MRN: [id number]   Subjective:   Patient ID: Kara Haynes female   DOB: 1991/09/02 23 y.o.   MRN: [id number]  HPI: Kara Haynes is a 24 y.o. with PMH of Pulmonary embolism, Asthma. Presented today with complaints of increased frequency of her periods. She now sees her periods 2 times a month, and this has been goiong on for the past 2 months now. This month she saw  Her period about the 6th of January and now she started seeing her period again yesterday- 10/04/2013. She believes her periods are also heavier, though no change in the number of pads or number of days she sees her periods. She uses 3 pads a day, and sees her periods for 4 days, she also endorses having blood clots also, but this also this is not new. She has associated cramps with her periods, worse than prior. No family hx of fibroids. She denies abdominal distension, she is not sexually active, denies vaginal discharge or fever. She rarely takes any naproxen or NSAIDS for cramps- as she has been told to try tylenol instead. No bleeding from any other orifice.  She denies dizziness or tired.   Past Medical History  Diagnosis Date  . Asthma   . Tobacco abuse   . Seasonal allergies   . UTI (lower urinary tract infection)   . Obesity    Current Outpatient Prescriptions  Medication Sig Dispense Refill  . albuterol (PROVENTIL HFA;VENTOLIN HFA) 108 (90 BASE) MCG/ACT inhaler Inhale 2 puffs into the lungs every 2 (two) hours as needed for wheezing or shortness of breath (cough). 1 Inhaler 0  . Albuterol Sulfate 108 (90 BASE) MCG/ACT AEPB Inhale 1-2 puffs into the lungs every 6 (six) hours as needed. 1 each 2  . rivaroxaban (XARELTO) 20 MG TABS tablet Take 1 tablet (20 mg total) by mouth daily with supper. Start after you complete the 15mg  tablets. 30 tablet 4   No current facility-administered medications for this visit.   Family  History  Problem Relation Age of Onset  . Other      denies family h/o clotting d/o   History   Social History  . Marital Status: Married    Spouse Name: N/A    Number of Children: N/A  . Years of Education: N/A   Social History Main Topics  . Smoking status: Current Every Day Smoker -- 0.25 packs/day    Types: Cigarettes  . Smokeless tobacco: Not on file     Comment: smoking less 3 ppd  . Alcohol Use: 0.0 oz/week    0 Not specified per week  . Drug Use: Yes    Special: Marijuana  . Sexual Activity: Yes    Birth Control/ Protection: None   Other Topics Concern  . Not on file   Social History Narrative   Review of Systems: CONSTITUTIONAL- No Fever, weightloss, night sweat or change in appetite. SKIN- No Rash, colour changes or itching. HEAD- No Headache or dizziness. EYES- No Vision loss, pain, redness, double or blurred vision. Mouth/throat- No Sorethroat, dentures, or bleeding gums. RESPIRATORY- No Cough or SOB. CARDIAC- No Palpitations, DOE, PND or chest pain. GI- No nausea, vomiting, diarrhoea, constipation, has crampy abd pain with periods. URINARY- No Frequency, urgency, straining or dysuria. NEUROLOGIC- No Numbness, syncope, seizures or burning. [facility] Denies depression or anxiety.  Objective:  Physical Exam: Filed Vitals:   10/05/14 1509  BP: 127/83  Pulse: 80  Temp: 98 F (36.7 C)  TempSrc: Oral  Height:  (1.549 m)  Weight: 214 lb 6.4 oz (97.251 kg)  SpO2: 99%   GENERAL- alert, co-operative, appears as stated age, not in any distress. HEENT- Atraumatic, normocephalic, PERRL, EOMI, oral mucosa appears moist. CARDIAC- RRR, no murmurs, rubs or gallops. RESP- Moving equal volumes of air, and clear to auscultation bilaterally, no wheezes or crackles. ABDOMEN- Soft, nontender, not distended, no guarding or rebound, no palpable masses or organomegaly, bowel sounds present. Declined pelvic exam. BACK- Normal curvature of the spine. NEURO- No obvious  Cr N abnormality, strenght upper and lower extremities-intact, Gait- Normal. EXTREMITIES- Well perfused, no pedal edema, no redness or warmth. SKIN- Warm, dry, No rash or lesion. PSYCH- Normal mood and affect, appropriate thought content and speech.  Assessment & Plan:   The patient's case and plan of care was discussed with attending physician, Dr. Cyndie Chime.  Please see problem based charting for assessment and plan.

## 2014-10-05 NOTE — Progress Notes (Signed)
Agree 

## 2014-10-05 NOTE — Assessment & Plan Note (Signed)
Patient still having problems getting her Xarelto. She has taken it consistently now since last visit- 09/20/2013. She was to complete paper work today, but she submitted a tax return form that was for 2013 instead of 2014 at least. She was therefore denied, but was given a 1 month supply for now, until she can complete all the required paper work and get approved for a full year supply, though she needs only  6 month supply- she will be completing treatment in May. She denies leg swelling, redness or warmth, difficulty breathing and has hardly had to us her inhaler.  Plan- Pt to complete paperwork within a month, stressed the importance of doing this, she understands. - If getting her prescriptions for xarelto continues to become a problem we might need to consider other cheap options for anticoagulation, I have explained this to her and that she will need to come in frequently for INR checks. 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 - Though total protein C levels- 07/2014 were mildly reduced this is not significant, as her Protein C activity is normal - Will consider this a provoked PE, considering presenting circumstances -acute infection at the times. She had an asthma exacebation also at the time.  - Lupus Anticoagulant- Final result- NOT DETECTABLE, no further workup recommended.

## 2014-10-05 NOTE — Progress Notes (Signed)
Medicine attending: Medical history, presenting problems, physical findings, and medications, reviewed with resident physician Dr. Emokpae and I concur with her evaluation and management plan. 

## 2014-10-05 NOTE — Progress Notes (Signed)
Pt stopped by office to hand in paperwork to CSW. She asked to talk with triage and states for the past 2 months she has had her cycle twice a month.  Her flow is heavier and she noted the change  when she started on Xarelto. Talked with attending and pt given appointment in clinic this afternoon for evaluation of this problem.

## 2014-10-05 NOTE — Patient Instructions (Addendum)
General Instructions:  We will be checking your blood count today. We will contact you if we have to do any thing about it. Otherwise, come to the clinic in 3-4 weeks for check up to ensure that you are not loosing to much blood and to ensure that you get your medications.   Also take iron tablets once a day, and eat a balanced diet. If you get constipated form tyeh iron tablets you can add  A stool softner like Colace. The Iron tablets and Colace can be gotten over the counter.  Please be sure you complete the paper work to get the full supply for your medication. As you are supposed to complete this medication in May.  If you feel dizzy, or that you are loosing too much blood, or you start bleeding form any other part of your body, you should let us know or go to the ED.  Please bring your medicines with you each time you come to clinic.  Medicines may include prescription medications, over-the-counter medications, herbal remedies, eye drops, vitamins, or other pills.

## 2014-10-05 NOTE — Assessment & Plan Note (Addendum)
Most likely due to Anticoagulant use- Xalreto.Marland Kitchen. Has not had PAP smear, but will defer till next visit, as patient is presently seeing her period and having cramps. She denies use of any other medications, sexual active- Pt is homosexual, no abdominal distension. So far- pt is not dizzy, no signs of bleeding form any orifice, BP- 127/53, pulse- 80bpm. Pt declined pelvic exma- say she is having her peroid now, and doesn't feel comfortable having a pelvic exam done.  Plans- CBC - Pelvic [place] rule out leiomyomas or pelvic masses- Doubt pt can afford this.  - PAP smear next visit. - Encourage pt to quit smoking, she now smokes 2 cigs a day. - Needs to be on xalreto for 4 more months- till May 2016. - Return precaution given.  - PAP smear in near future - Take iron tablets daily and OTC stool softner if needed.

## 2014-11-21 ENCOUNTER — Encounter: Payer: Self-pay | Admitting: Internal Medicine

## 2014-11-21 ENCOUNTER — Ambulatory Visit (INDEPENDENT_AMBULATORY_CARE_PROVIDER_SITE_OTHER): Payer: Self-pay | Admitting: Internal Medicine

## 2014-11-21 VITALS — BP 117/55 | HR 90 | Temp 98.1°F | Ht 61.0 in | Wt 214.9 lb

## 2014-11-21 DIAGNOSIS — I2699 Other pulmonary embolism without acute cor pulmonale: Secondary | ICD-10-CM

## 2014-11-21 DIAGNOSIS — Z0000 Encounter for general adult medical examination without abnormal findings: Secondary | ICD-10-CM

## 2014-11-21 DIAGNOSIS — N921 Excessive and frequent menstruation with irregular cycle: Secondary | ICD-10-CM

## 2014-11-21 NOTE — Patient Instructions (Signed)
We will like you to get your PAP Smear done sometime in the next month. Please stop by the front desk so you can schedule a date and time is convenient for you. Otherwise you can come on the 18th of April to get it done here, according to the information on the Voucher we gave to you.  It was nice seeing you today.     Pap Test A Pap test checks the cells on the surface of your cervix. Your doctor will look for cell changes that are not normal, an infection, or cancer. If the cells no longer look normal, it is called dysplasia. Dysplasia can turn into cancer. Regular Pap tests are important to stop cancer from developing. BEFORE THE PROCEDURE  Ask your doctor when to schedule your Pap test. Timing the test around your period may be important.  Do not douche or have sex (intercourse) for 24 hours before the test.  Do not put creams on your vagina or use tampons for 24 hours before the test.  Go pee (urinate) just before the test. PROCEDURE  You will lie on an exam table with your feet in stirrups.  A warm metal or plastic tool (speculum) will be put in your vagina to open it up.  Your doctor will use a small, plastic brush or wooden spatula to take cells from your cervix.  The cells will be put in a lab container.  The cells will be checked under a microscope to see if they are normal or not. AFTER THE PROCEDURE Get your test results. If they are abnormal, you may need more tests.

## 2014-11-21 NOTE — Progress Notes (Signed)
Patient ID: Kara Haynes, female   DOB: Jul 31, 1991, 24 y.o.   MRN: [id number]   Subjective:   Patient ID: Kara Haynes female   DOB: Jul 31, 1991 23 y.o.   MRN: [id number]  HPI: Kara Haynes is a 24 y.o. with PMH listed below, presented today for follow up visit, made to ensure she had gotten and was compliant with her Xarelto. Patient otherwise denies chestpain, SOB, easy briusing, heavy periods have resolved, denies nose bleeds, cough.    Past Medical History  Diagnosis Date  . Asthma   . Tobacco abuse   . Seasonal allergies   . UTI (lower urinary tract infection)   . Obesity    Current Outpatient Prescriptions  Medication Sig Dispense Refill  . albuterol (PROVENTIL HFA;VENTOLIN HFA) 108 (90 BASE) MCG/ACT inhaler Inhale 2 puffs into the lungs every 2 (two) hours as needed for wheezing or shortness of breath (cough). 1 Inhaler 0  . Albuterol Sulfate 108 (90 BASE) MCG/ACT AEPB Inhale 1-2 puffs into the lungs every 6 (six) hours as needed. 1 each 2  . rivaroxaban (XARELTO) 20 MG TABS tablet Take 1 tablet (20 mg total) by mouth daily with supper. Start after you complete the 15mg  tablets. 30 tablet 4   No current facility-administered medications for this visit.   Family History  Problem Relation Age of Onset  . Other      denies family h/o clotting d/o   History   Social History  . Marital Status: Married    Spouse Name: N/A  . Number of Children: N/A  . Years of Education: N/A   Social History Main Topics  . Smoking status: Current Every Day Smoker -- 0.25 packs/day    Types: Cigarettes  . Smokeless tobacco: Not on file     Comment: smoking less 3 ppd  . Alcohol Use: 0.0 oz/week    0 Standard drinks or equivalent per week  . Drug Use: Yes    Special: Marijuana  . Sexual Activity: Yes    Birth Control/ Protection: None   Other Topics Concern  . None   Social History Narrative   Review of Systems: CONSTITUTIONAL- No Fever,  no change in appetite. HEAD- No Headache or dizziness. Mouth/throat- No Sorethroat, dentures, or bleeding gums. RESPIRATORY- No Cough or SOB. CARDIAC- No chest pain. GI- No nausea, vomiting, diarrhoea, constipation, abd pain. URINARY- No Frequency, urgency, straining or dysuria.  Objective:  Physical Exam: Filed Vitals:   11/21/14 1606  BP: 117/55  Pulse: 90  Temp: 98.1 F (36.7 C)  TempSrc: Oral  Height: 5\' 1"  (1.549 m)  Weight: 214 lb 14.4 oz (97.478 kg)  SpO2: 100%   GENERAL- alert, co-operative, obese young lady, appears as stated age, not in any distress. HEENT- Atraumatic, normocephalic, PERRL, neck supple. CARDIAC- RRR, no murmurs, rubs or gallops. RESP- Moving equal volumes of air, no wheezes or crackles. ABDOMEN- Soft, nontender,  bowel sounds present. NEURO- No obvious Cr N abnormality, Gait- Normal. EXTREMITIES- pulse 2+, symmetric, no pedal edema. PSYCH- Normal mood and affect, appropriate thought content and speech.  Assessment & Plan:   The patient's case and plan of care was discussed with attending physician, Dr. Rogelia BogaButcher  Please see problem based charting for assessment and plan.

## 2014-11-22 DIAGNOSIS — Z0000 Encounter for general adult medical examination without abnormal findings: Secondary | ICD-10-CM | POA: Insufficient documentation

## 2014-11-22 NOTE — Assessment & Plan Note (Signed)
Patient completed paper work. She has been getting 30day tablets with refills. She denies any symptoms, and is complaint with her medications.

## 2014-11-22 NOTE — Assessment & Plan Note (Addendum)
Pt has never had a PAP smear done. She declines having it done today ,she is uncomfortable having it done today. She will like to schedule an appointment in the next 3-4 weeks, just for that, if I am not successful as she is obese, [facility] or have her come to the PAP smear clinci in April.  Plan- gave pt educational materials/information about PAP smear.

## 2014-11-22 NOTE — Progress Notes (Signed)
Internal Medicine Clinic Attending  Case discussed with Dr. Mariea ClontsEmokpae soon after the resident saw the patient.  We reviewed the resident's history and exam and pertinent patient test results.  I agree with the assessment, diagnosis, and plan of care documented in the resident's note. Pap next appt with Dr Mariea ClontsEmokpae.

## 2014-11-22 NOTE — Assessment & Plan Note (Signed)
Heavy bleeding has significantly improved compared to complaints at last visit. HgB last visit was stable.

## 2014-11-28 ENCOUNTER — Telehealth: Payer: Self-pay | Admitting: *Deleted

## 2014-11-28 NOTE — Telephone Encounter (Signed)
Pt called and states she is coming down with a cold. She sounds congested on phone. She is wanting to know what meds she can take for cold.  OTC She did get some OTC meds and noted their was aspirin in the medication.  Pt is on Xarelto and she can not add ASA to her meds. Can you advise?   Hx: Asthma, PE

## 2014-11-28 NOTE — Telephone Encounter (Signed)
For fever or myalgias - tylenol For cough - Dextromethorphan type med. (two examples are Vicks DayQuil Cough or robitussin max strength) For congestion - mucinex or any OTC decongestant  No aspirin, NSAID.  Needs to use caution and read labels as many OTC meds have many med, inc tyelnol, so that she doesn't "OD"  Take plenty of fluids, good handwashing, cover mouth / nose for sneezing / cough  [facility] if asthma flares

## 2014-11-28 NOTE — Telephone Encounter (Signed)
Instructions were read to pt. She voices understanding

## 2015-01-23 ENCOUNTER — Encounter: Payer: Self-pay | Admitting: Internal Medicine

## 2015-01-23 ENCOUNTER — Ambulatory Visit (INDEPENDENT_AMBULATORY_CARE_PROVIDER_SITE_OTHER): Payer: Self-pay | Admitting: Internal Medicine

## 2015-01-23 VITALS — BP 126/62 | HR 83 | Temp 98.7°F | Ht 62.0 in | Wt 220.3 lb

## 2015-01-23 DIAGNOSIS — Z0000 Encounter for general adult medical examination without abnormal findings: Secondary | ICD-10-CM

## 2015-01-23 DIAGNOSIS — I2699 Other pulmonary embolism without acute cor pulmonale: Secondary | ICD-10-CM

## 2015-01-23 DIAGNOSIS — R9389 Abnormal findings on diagnostic imaging of other specified body structures: Secondary | ICD-10-CM

## 2015-01-23 DIAGNOSIS — R938 Abnormal findings on diagnostic imaging of other specified body structures: Secondary | ICD-10-CM

## 2015-01-23 DIAGNOSIS — F17210 Nicotine dependence, cigarettes, uncomplicated: Secondary | ICD-10-CM

## 2015-01-23 DIAGNOSIS — Z7901 Long term (current) use of anticoagulants: Secondary | ICD-10-CM

## 2015-01-23 NOTE — Patient Instructions (Signed)
It was nice seeing you today. We will let you know the results of your PAP smear when it is ready.  Also after this month- MAY ends, you can stop taking your xalreto. We will see you in July to be sure things are fine.   Also after that CT scan that was done on your chest in November when you were on admission, there were a few abnormalities that were present and we were to recheck with a repeat Ct scan to be sure that these abnormalities are not present or getting worse.  Remember if you develop leg swelling, redness or pain, chest pain, or difficulty breathing that is not related to your asthma attack, you should let us know and if sever go to the Emergency room immediately.

## 2015-01-23 NOTE — Assessment & Plan Note (Addendum)
She has been complaint with her Kerin SalenXalreto over the past months. She will be completing 6 months of therapy at the end of this month. She was diagnosed 08/10/2015 with a CTA scan. Recs on that scan was for repeat imaging to reaccess nodules seen. Pt has no insurance.  Plan- [facility], unsure if this will be helpful - Pt told about need for CT scan, she voiced understanding, CT scan with contrast ordered. - After completing her Bing ReeXalrelto for the month of May she will stop treatment. - Will see patient 4 weeks after stoping xalreto - Reiterated symptoms of DVT and PE and that she should be sure to let us know if she notices this or go to the ED if having difficulty breathing and or chest pains not related Asthma attacks.

## 2015-01-23 NOTE — Progress Notes (Signed)
Patient ID: Kara EatonShanequia J Haynes, female   DOB: Mar 29, 1991, 24 y.o.   MRN: [id number]   Subjective:   Patient ID: Kara LandauShanequia J Haynes female   DOB: Mar 29, 1991 23 y.o.   MRN: [id number]  HPI: Kara Haynes is a 24 y.o. with PMH listed below. Presented today for routine follow up visit.   Past Medical History  Diagnosis Date  . Asthma   . Tobacco abuse   . Seasonal allergies   . UTI (lower urinary tract infection)   . Obesity    Current Outpatient Prescriptions  Medication Sig Dispense Refill  . albuterol (PROVENTIL HFA;VENTOLIN HFA) 108 (90 BASE) MCG/ACT inhaler Inhale 2 puffs into the lungs every 2 (two) hours as needed for wheezing or shortness of breath (cough). 1 Inhaler 0  . Albuterol Sulfate 108 (90 BASE) MCG/ACT AEPB Inhale 1-2 puffs into the lungs every 6 (six) hours as needed. 1 each 2  . rivaroxaban (XARELTO) 20 MG TABS tablet Take 1 tablet (20 mg total) by mouth daily with supper. Start after you complete the 15mg  tablets. 30 tablet 4   No current facility-administered medications for this visit.   Family History  Problem Relation Age of Onset  . Other      denies family h/o clotting d/o   History   Social History  . Marital Status: Married    Spouse Name: N/A  . Number of Children: N/A  . Years of Education: N/A   Social History Main Topics  . Smoking status: Current Some Day Smoker -- 0.25 packs/day    Types: Cigarettes  . Smokeless tobacco: Not on file     Comment: smoking less 3 cigarettes aday  . Alcohol Use: 0.0 oz/week    0 Standard drinks or equivalent per week  . Drug Use: Yes    Special: Marijuana  . Sexual Activity: Yes    Birth Control/ Protection: None   Other Topics Concern  . None   Social History Narrative   Review of Systems: CONSTITUTIONAL- No Fever, weightloss, night sweat or change in appetite. SKIN- Some slight occasional bruising. HEAD- No Headache or dizziness. RESPIRATORY- No Cough or SOB. CARDIAC-  No chest pain. GI- No nausea, vomiting, diarrhoea, constipation, abd pain. URINARY- No Frequency, urgency, straining or dysuria. [facility]- Denies depression or anxiety.  Objective:  Physical Exam: Filed Vitals:   01/23/15 1533  BP: 126/62  Pulse: 83  Temp: 98.7 F (37.1 C)  TempSrc: Oral  Height: 5\' 2"  (1.575 m)  Weight: 220 lb 4.8 oz (99.927 kg)  SpO2: 99%   GENERAL- alert, co-operative, appears as stated age, not in any distress. HEENT- Atraumatic, normocephalic, neck supple. CARDIAC- RRR, no murmurs, rubs or gallops. RESP- Moving equal volumes of air, and clear to auscultation bilaterally, no wheezes or crackles. ABDOMEN- Soft, nontender, bowel sounds present. NEURO- No obvious Cr N abnormality, Gait- Normal. EXTREMITIES- pulse 2+, symmetric, no pedal edema. SKIN- Warm, dry, No rash or lesion. PSYCH- Normal mood and affect, appropriate thought content and speech.  Assessment & Plan:   The patient's case and plan of care was discussed with attending physician, Dr. Heide SparkNarendra.  Please see problem based charting for assessment and plan.

## 2015-01-24 LAB — CYTOLOGY - PAP

## 2015-01-24 NOTE — Assessment & Plan Note (Addendum)
PAp smear today. Pt last sexual intercourse- Once when she was 15.

## 2015-01-24 NOTE — Assessment & Plan Note (Signed)
Order follow up Ct scan with contrast to access nodularity seen on CTA.

## 2015-02-18 NOTE — Progress Notes (Signed)
INTERNAL MEDICINE TEACHING ATTENDING ADDENDUM - Jassmine Vandruff, MD: I reviewed and discussed at the time of visit with the resident Dr. Emokpae, the patient's medical history, physical examination, diagnosis and results of pertinent tests and treatment and I agree with the patient's care as documented.  

## 2015-07-09 ENCOUNTER — Emergency Department (HOSPITAL_COMMUNITY): Payer: Self-pay

## 2015-07-09 ENCOUNTER — Encounter (HOSPITAL_COMMUNITY): Payer: Self-pay | Admitting: *Deleted

## 2015-07-09 ENCOUNTER — Other Ambulatory Visit: Payer: Self-pay

## 2015-07-09 ENCOUNTER — Emergency Department (HOSPITAL_COMMUNITY)
Admission: EM | Admit: 2015-07-09 | Discharge: 2015-07-10 | Disposition: A | Discharge status: Home / Self Care | Payer: Self-pay | Attending: Emergency Medicine | Admitting: Emergency Medicine

## 2015-07-09 DIAGNOSIS — E669 Obesity, unspecified: Secondary | ICD-10-CM | POA: Insufficient documentation

## 2015-07-09 DIAGNOSIS — Z87440 Personal history of urinary (tract) infections: Secondary | ICD-10-CM | POA: Insufficient documentation

## 2015-07-09 DIAGNOSIS — Z720 Tobacco use: Secondary | ICD-10-CM | POA: Insufficient documentation

## 2015-07-09 DIAGNOSIS — Z79899 Other long term (current) drug therapy: Secondary | ICD-10-CM | POA: Insufficient documentation

## 2015-07-09 DIAGNOSIS — Z86711 Personal history of pulmonary embolism: Secondary | ICD-10-CM | POA: Insufficient documentation

## 2015-07-09 DIAGNOSIS — J45901 Unspecified asthma with (acute) exacerbation: Secondary | ICD-10-CM | POA: Insufficient documentation

## 2015-07-09 HISTORY — DX: Other pulmonary embolism without acute cor pulmonale: I26.99

## 2015-07-09 MED ORDER — ALBUTEROL SULFATE (2.5 MG/3ML) 0.083% IN NEBU
5.0000 mg | INHALATION_SOLUTION | Freq: Once | RESPIRATORY_TRACT | Status: AC
  Administered 2015-07-09: 5 mg via RESPIRATORY_TRACT
  Filled 2015-07-09: qty 6

## 2015-07-09 NOTE — ED Notes (Signed)
Pt states that she is having trouble with her asthma as the seasons are changing; pt states that she did a breathing treatment this am without much relief; pt reports congestion, cough and feeling tight; pt states that she is out of her inhalers which is not helping things either; pt able to talk in full sentences; no acute resp distress noted

## 2015-07-10 ENCOUNTER — Emergency Department (HOSPITAL_COMMUNITY): Payer: Self-pay

## 2015-07-10 ENCOUNTER — Encounter (HOSPITAL_COMMUNITY): Payer: Self-pay

## 2015-07-10 LAB — CBC WITH DIFFERENTIAL/PLATELET
Basophils Absolute: 0 10*3/uL (ref 0.0–0.1)
Basophils Relative: 0 %
EOS ABS: 0.3 10*3/uL (ref 0.0–0.7)
EOS PCT: 5 %
HCT: 39.5 % (ref 36.0–46.0)
Hemoglobin: 13.5 g/dL (ref 12.0–15.0)
Lymphocytes Relative: 55 %
Lymphs Abs: 3.4 10*3/uL (ref 0.7–4.0)
MCH: 28.8 pg (ref 26.0–34.0)
MCHC: 34.2 g/dL (ref 30.0–36.0)
MCV: 84.4 fL (ref 78.0–100.0)
MONOS PCT: 8 %
Monocytes Absolute: 0.5 10*3/uL (ref 0.1–1.0)
Neutro Abs: 1.9 10*3/uL (ref 1.7–7.7)
Neutrophils Relative %: 32 %
PLATELETS: 285 10*3/uL (ref 150–400)
RBC: 4.68 MIL/uL (ref 3.87–5.11)
RDW: 14.4 % (ref 11.5–15.5)
WBC: 6.1 10*3/uL (ref 4.0–10.5)

## 2015-07-10 LAB — BASIC METABOLIC PANEL
Anion gap: 10 (ref 5–15)
BUN: 11 mg/dL (ref 6–20)
CO2: 20 mmol/L — AB (ref 22–32)
CREATININE: 0.87 mg/dL (ref 0.44–1.00)
Calcium: 9 mg/dL (ref 8.9–10.3)
Chloride: 108 mmol/L (ref 101–111)
GFR calc Af Amer: >60 mL/min (ref >60)
GFR calc non Af Amer: >60 mL/min (ref >60)
Glucose, Bld: 96 mg/dL (ref 65–99)
Potassium: 3.5 mmol/L (ref 3.5–5.1)
SODIUM: 138 mmol/L (ref 135–145)

## 2015-07-10 LAB — I-STAT BETA HCG BLOOD, ED (MC, WL, AP ONLY): I-stat hCG, quantitative: <5 m[IU]/mL (ref <5)

## 2015-07-10 MED ORDER — ALBUTEROL SULFATE HFA 108 (90 BASE) MCG/ACT IN AERS
2.0000 | INHALATION_SPRAY | RESPIRATORY_TRACT | Status: DC | PRN
  Administered 2015-07-10: 2 via RESPIRATORY_TRACT
  Filled 2015-07-10: qty 6.7

## 2015-07-10 MED ORDER — PREDNISONE 20 MG PO TABS: 40.0000 mg | ORAL_TABLET | Freq: Every day | ORAL | Status: DC

## 2015-07-10 MED ORDER — PREDNISONE 20 MG PO TABS: 60.0000 mg | ORAL_TABLET | Freq: Once | ORAL | Status: AC

## 2015-07-10 MED ORDER — IOHEXOL 300 MG/ML  SOLN
75.0000 mL | Freq: Once | INTRAMUSCULAR | Status: AC | PRN
  Administered 2015-07-10: 75 mL via INTRAVENOUS

## 2015-07-10 MED ORDER — IPRATROPIUM-ALBUTEROL 0.5-2.5 (3) MG/3ML IN SOLN
3.0000 mL | Freq: Once | RESPIRATORY_TRACT | Status: AC
  Administered 2015-07-10: 3 mL via RESPIRATORY_TRACT
  Filled 2015-07-10: qty 3

## 2015-07-10 MED ORDER — PREDNISONE 20 MG PO TABS
60.0000 mg | ORAL_TABLET | Freq: Once | ORAL | Status: AC
  Administered 2015-07-10: 60 mg via ORAL
  Filled 2015-07-10: qty 3

## 2015-07-10 NOTE — ED Notes (Signed)
Family at bedside. 

## 2015-07-10 NOTE — ED Provider Notes (Signed)
CSN: [id number]     Arrival date & time 07/09/15  2150 History  By signing my name below, I, Tanda Rockers, attest that this documentation has been prepared under the direction and in the presence of Dione Booze, MD. Electronically Signed: Tanda Rockers, ED Scribe. 07/10/2015. 12:49 AM.  Chief Complaint  Patient presents with  . Asthma   The history is provided by the patient. No language interpreter was used.     HPI Comments: Kara Haynes is a 24 y.o. female with hx asthma who presents to the Emergency Department complaining of gradual onset, constant, asthma exacerbation and shortness of breath that began earlier this morning, progressively worsening tonight. Pt did a breathing treatment in the morning with mild relief. She reports wheezing prior to the breathing treatment that has since subsided. Pt also complains of a dry cough and chills. She mentions that she ran out of her inhaler recently. Denies fever, diaphoresis, chest pain, or any other associated symptoms.   Past Medical History  Diagnosis Date  . Asthma   . Tobacco abuse   . Seasonal allergies   . UTI (lower urinary tract infection)   . Obesity   . PE (pulmonary embolism)    History reviewed. No pertinent past surgical history. Family History  Problem Relation Age of Onset  . Other      denies family h/o clotting d/o   Social History  Substance Use Topics  . Smoking status: Current Some Day Smoker -- 0.50 packs/day    Types: Cigarettes  . Smokeless tobacco: None     Comment: smoking less 3 cigarettes aday  . Alcohol Use: 0.0 oz/week    0 Standard drinks or equivalent per week     Comment: occ   OB History    No data available     Review of Systems  All other systems reviewed and are negative.  Allergies  Review of patient's allergies indicates no known allergies.  Home Medications   Prior to Admission medications   Medication Sig Start Date End Date Taking? Authorizing Provider   albuterol (PROVENTIL HFA;VENTOLIN HFA) 108 (90 BASE) MCG/ACT inhaler Inhale 2 puffs into the lungs every 2 (two) hours as needed for wheezing or shortness of breath (cough). 12/04/13  Yes Wayland Salinas, MD  albuterol (PROVENTIL) (2.5 MG/3ML) 0.083% nebulizer solution Take 2.5 mg by nebulization every 6 (six) hours as needed for wheezing or shortness of breath.   Yes Historical Provider, MD  Albuterol Sulfate 108 (90 BASE) MCG/ACT AEPB Inhale 1-2 puffs into the lungs every 6 (six) hours as needed. Patient not taking: Reported on 07/09/2015 08/12/14   Ejiroghene Wendall Stade, MD  rivaroxaban (XARELTO) 20 MG TABS tablet Take 1 tablet (20 mg total) by mouth daily with supper. Start after you complete the  tablets. Patient not taking: Reported on 07/09/2015 10/05/14   Ejiroghene Wendall Stade, MD   Triage Vitals: BP 124/58 mmHg  Pulse 74  Temp(Src) 98.2 F (36.8 C) (Temporal)  Resp 21  Ht  (1.575 m)  Wt 208 lb (94.348 kg)  BMI 38.03 kg/m2  SpO2 98%  LMP 06/18/2015   Physical Exam  Constitutional: She is oriented to person, place, and time. She appears well-developed and well-nourished. No distress.  HENT:  Head: Normocephalic and atraumatic.  Eyes: Conjunctivae and EOM are normal. Pupils are equal, round, and reactive to light.  Neck: Normal range of motion. Neck supple. No JVD present.  Cardiovascular: Normal rate, regular rhythm and normal heart sounds.  No murmur heard. Pulmonary/Chest: Effort normal. No respiratory distress. She has wheezes. She has no rales. She exhibits no tenderness.  Slightly prolonged exhalation phase with few scattered wheezes  Abdominal: Soft. Bowel sounds are normal. She exhibits no distension and no mass. There is no tenderness.  Musculoskeletal: Normal range of motion. She exhibits no edema.  Lymphadenopathy:    She has no cervical adenopathy.  Neurological: She is alert and oriented to person, place, and time. She exhibits normal muscle tone. Coordination  normal.  Skin: Skin is warm and dry. No rash noted.  Psychiatric: She has a normal mood and affect. Her behavior is normal. Judgment and thought content normal.  Nursing note and vitals reviewed.   ED Course  Procedures (including critical care time)  DIAGNOSTIC STUDIES: Oxygen Saturation is 98% on RA, normal by my interpretation.    COORDINATION OF CARE: 12:49 AM-Discussed treatment plan which includes CT Chest, beta hCG, BMP, and CBC with pt at bedside and pt agreed to plan.   Results Revies Results for orders placed or performed during the hospital encounter of 07/09/15  Basic metabolic panel  Result Value Ref Range   Sodium 138 135 - 145 mmol/L   Potassium 3.5 3.5 - 5.1 mmol/L   Chloride 108 101 - 111 mmol/L   CO2 20 (L) 22 - 32 mmol/L   Glucose, Bld 96 65 - 99 mg/dL   BUN 11 6 - 20 mg/dL   Creatinine, Ser 0.98 0.44 - 1.00 mg/dL   Calcium 9.0 8.9 - 11.9 mg/dL   GFR calc non Af Amer >60 >60 mL/min   GFR calc Af Amer >60 >60 mL/min   Anion gap 10 5 - 15  CBC with Differential  Result Value Ref Range   WBC 6.1 4.0 - 10.5 K/uL   RBC 4.68 3.87 - 5.11 MIL/uL   Hemoglobin 13.5 12.0 - 15.0 g/dL   HCT 14.7 82.9 - 56.2 %   MCV 84.4 78.0 - 100.0 fL   MCH 28.8 26.0 - 34.0 pg   MCHC 34.2 30.0 - 36.0 g/dL   RDW 13.0 86.5 - 78.4 %   Platelets 285 150 - 400 K/uL   Neutrophils Relative % 32 %   Neutro Abs 1.9 1.7 - 7.7 K/uL   Lymphocytes Relative 55 %   Lymphs Abs 3.4 0.7 - 4.0 K/uL   Monocytes Relative 8 %   Monocytes Absolute 0.5 0.1 - 1.0 K/uL   Eosinophils Relative 5 %   Eosinophils Absolute 0.3 0.0 - 0.7 K/uL   Basophils Relative 0 %   Basophils Absolute 0.0 0.0 - 0.1 K/uL  I-Stat beta hCG blood, ED  Result Value Ref Range   I-stat hCG, quantitative <5.0 <5 mIU/mL   Comment 3           Imaging Review Dg Chest 2 View  07/09/2015  CLINICAL DATA:  Acute onset of generalized cough and shortness of breath. Initial encounter. EXAM: CHEST  2 VIEW COMPARISON:  CTA of the  chest and chest radiograph performed 08/09/2014 FINDINGS: The lungs are well-aerated and clear. There is no evidence of focal opacification, pleural effusion or pneumothorax. The heart is normal in size; the mediastinal contour is within normal limits. No acute osseous abnormalities are seen. IMPRESSION: No acute cardiopulmonary process seen. Electronically Signed   By: Roanna Raider M.D.   On: 07/09/2015 23:07   Ct Chest W Contrast  07/10/2015  CLINICAL DATA:  Acute onset of shortness of breath, chills and dry cough.  Initial encounter. EXAM: CT CHEST WITH CONTRAST TECHNIQUE: Multidetector CT imaging of the chest was performed during intravenous contrast administration. CONTRAST:  75 mL of Omnipaque 300 IV contrast COMPARISON:  Chest radiograph performed 07/09/2015, and CTA of the chest performed 08/09/2014 FINDINGS: Minimal right basilar atelectasis is noted. The lungs are otherwise clear. There is no evidence of focal consolidation, pleural effusion or pneumothorax. No masses are identified. The mediastinum is unremarkable in appearance. No mediastinal lymphadenopathy is seen. No pericardial effusion is identified. The great vessels are grossly unremarkable in appearance. The visualized portions of the thyroid gland are unremarkable. No axillary lymphadenopathy is seen. The visualized portions of the liver and spleen are unremarkable. The visualized portions of the pancreas, adrenal glands and kidneys are within normal limits. No acute osseous abnormalities are identified. IMPRESSION: Minimal right basilar atelectasis noted.  Lungs otherwise clear. Electronically Signed   By: Roanna RaiderJeffery  Chang M.D.   On: 07/10/2015 03:04   I have personally reviewed and evaluated these images and lab results as part of my medical decision-making.   EKG Interpretation   Date/Time:  Monday July 09 2015 22:57:13 EDT Ventricular Rate:  66 PR Interval:  123 QRS Duration: 88 QT Interval:  368 QTC Calculation: 385 R  Axis:   57 Text Interpretation:  Sinus rhythm Borderline Q waves in lateral leads  Baseline wander in lead(s) V4 When compared with ECG of 08/10/2014, No  significant change was found Confirmed by [facility]  MD, Jase Reep ([id number]) on  07/10/2015 1:00:09 AM      MDM   Final diagnoses:  Asthma exacerbation    Asthma exacerbation secondary to running out of medication at home. Patient states she is unable to afford prescription for albuterol inhaler. Old records are reviewed and she had been admitted for pulmonary embolism in November 2015 and had been on anticoagulants until June. She was supposed to go for CT of her chest to evaluate pulmonary nodules which were identified at her original hospitalization for pulmonary embolism. She states she is no longer going to the clinic where she was being followed. Therefore, she is sent for CT of her chest to evaluate these nodules. CT has come back unremarkable. She is discharged with a prescription for prednisone and is given an albuterol inhaler to take home. She is given resource guide to try to find appropriate clinic to reestablish care.    I personally performed the services described in this documentation, which was scribed in my presence. The recorded information has been reviewed and is accurate.        Dione Boozeavid Zandra Lajeunesse, MD 07/10/15 [phone]

## 2015-07-10 NOTE — Discharge Instructions (Signed)
Asthma, Adult Asthma is a recurring condition in which the airways tighten and narrow. Asthma can make it difficult to breathe. It can cause coughing, wheezing, and shortness of breath. Asthma episodes, also called asthma attacks, range from minor to life-threatening. Asthma cannot be cured, but medicines and lifestyle changes can help control it. CAUSES Asthma is believed to be caused by inherited (genetic) and environmental factors, but its exact cause is unknown. Asthma may be triggered by allergens, lung infections, or irritants in the air. Asthma triggers are different for each person. Common triggers include:   Animal dander.  Dust mites.  Cockroaches.  Pollen from trees or grass.  Mold.  Smoke.  Air pollutants such as dust, household cleaners, hair sprays, aerosol sprays, paint fumes, strong chemicals, or strong odors.  Cold air, weather changes, and winds (which increase molds and pollens in the air).  Strong emotional expressions such as crying or laughing hard.  Stress.  Certain medicines (such as aspirin) or types of drugs (such as beta-blockers).  Sulfites in foods and drinks. Foods and drinks that may contain sulfites include dried fruit, potato chips, and sparkling grape juice.  Infections or inflammatory conditions such as the flu, a cold, or an inflammation of the nasal membranes (rhinitis).  Gastroesophageal reflux disease (GERD).  Exercise or strenuous activity. SYMPTOMS Symptoms may occur immediately after asthma is triggered or many hours later. Symptoms include:  Wheezing.  Excessive nighttime or early morning coughing.  Frequent or severe coughing with a common cold.  Chest tightness.  Shortness of breath. DIAGNOSIS  The diagnosis of asthma is made by a review of your medical history and a physical exam. Tests may also be performed. These may include:  Lung function studies. These tests show how much air you breathe in and out.  Allergy  tests.  Imaging tests such as X-rays. TREATMENT  Asthma cannot be cured, but it can usually be controlled. Treatment involves identifying and avoiding your asthma triggers. It also involves medicines. There are 2 classes of medicine used for asthma treatment:   Controller medicines. These prevent asthma symptoms from occurring. They are usually taken every day.  Reliever or rescue medicines. These quickly relieve asthma symptoms. They are used as needed and provide short-term relief. Your health care provider will help you create an asthma action plan. An asthma action plan is a written plan for managing and treating your asthma attacks. It includes a list of your asthma triggers and how they may be avoided. It also includes information on when medicines should be taken and when their dosage should be changed. An action plan may also involve the use of a device called a peak flow meter. A peak flow meter measures how well the lungs are working. It helps you monitor your condition. HOME CARE INSTRUCTIONS   Take medicines only as directed by your health care provider. Speak with your health care provider if you have questions about how or when to take the medicines.  Use a peak flow meter as directed by your health care provider. Record and keep track of readings.  Understand and use the action plan to help minimize or stop an asthma attack without needing to seek medical care.  Control your home environment in the following ways to help prevent asthma attacks:  Do not smoke. Avoid being exposed to secondhand smoke.  Change your heating and air conditioning filter regularly.  Limit your use of fireplaces and wood stoves.  Get rid of pests (such as roaches  and mice) and their droppings.  Throw away plants if you see mold on them.  Clean your floors and dust regularly. Use unscented cleaning products.  Try to have someone else vacuum for you regularly. Stay out of rooms while they are  being vacuumed and for a short while afterward. If you vacuum, use a dust mask from a hardware store, a double-layered or microfilter vacuum cleaner bag, or a vacuum cleaner with a HEPA filter.  Replace carpet with wood, tile, or vinyl flooring. Carpet can trap dander and dust.  Use allergy-proof pillows, mattress covers, and box spring covers.  Wash bed sheets and blankets every week in hot water and dry them in a dryer.  Use blankets that are made of polyester or cotton.  Clean bathrooms and kitchens with bleach. If possible, have someone repaint the walls in these rooms with mold-resistant paint. Keep out of the rooms that are being cleaned and painted.  Wash hands frequently. SEEK MEDICAL CARE IF:   You have wheezing, shortness of breath, or a cough even if taking medicine to prevent attacks.  The colored mucus you cough up (sputum) is thicker than usual.  Your sputum changes from clear or white to yellow, green, gray, or bloody.  You have any problems that may be related to the medicines you are taking (such as a rash, itching, swelling, or trouble breathing).  You are using a reliever medicine more than 2-3 times per week.  Your peak flow is still at 50-79% of your personal best after following your action plan for 1 hour.  You have a fever. SEEK IMMEDIATE MEDICAL CARE IF:   You seem to be getting worse and are unresponsive to treatment during an asthma attack.  You are short of breath even at rest.  You get short of breath when doing very little physical activity.  You have difficulty eating, drinking, or talking due to asthma symptoms.  You develop chest pain.  You develop a fast heartbeat.  You have a bluish color to your lips or fingernails.  You are light-headed, dizzy, or faint.  Your peak flow is less than 50% of your personal best.   This information is not intended to replace advice given to you by your health care provider. Make sure you discuss any  questions you have with your health care provider.   Document Released: 09/01/2005 Document Revised: 05/23/2015 Document Reviewed: 03/31/2013 Elsevier Interactive Patient Education 2016 Elsevier Inc.  Prednisone tablets What is this medicine? PREDNISONE (PRED ni sone) is a corticosteroid. It is commonly used to treat inflammation of the skin, joints, lungs, and other organs. Common conditions treated include asthma, allergies, and arthritis. It is also used for other conditions, such as blood disorders and diseases of the adrenal glands. This medicine may be used for other purposes; ask your health care provider or pharmacist if you have questions. What should I tell my health care provider before I take this medicine? They need to know if you have any of these conditions: -Cushing's syndrome -diabetes -glaucoma -heart disease -high blood pressure -infection (especially a virus infection such as chickenpox, cold sores, or herpes) -kidney disease -liver disease -mental illness -myasthenia gravis -osteoporosis -seizures -stomach or intestine problems -thyroid disease -an unusual or allergic reaction to lactose, prednisone, other medicines, foods, dyes, or preservatives -pregnant or trying to get pregnant -breast-feeding How should I use this medicine? Take this medicine by mouth with a glass of water. Follow the directions on the prescription label. Take  this medicine with food. If you are taking this medicine once a day, take it in the morning. Do not take more medicine than you are told to take. Do not suddenly stop taking your medicine because you may develop a severe reaction. Your doctor will tell you how much medicine to take. If your doctor wants you to stop the medicine, the dose may be slowly lowered over time to avoid any side effects. °Talk to your pediatrician regarding the use of this medicine in children. Special care may be needed. °Overdosage: If you think you have taken  too much of this medicine contact a poison control center or emergency room at once. °NOTE: This medicine is only for you. Do not share this medicine with others. °What if I miss a dose? °If you miss a dose, take it as soon as you can. If it is almost time for your next dose, talk to your doctor or health care professional. You may need to miss a dose or take an extra dose. Do not take double or extra doses without advice. °What may interact with this medicine? °Do not take this medicine with any of the following medications: °-metyrapone °-mifepristone °This medicine may also interact with the following medications: °-aminoglutethimide °-amphotericin B °-aspirin and aspirin-like medicines °-barbiturates °-certain medicines for diabetes, like glipizide or glyburide °-cholestyramine °-cholinesterase inhibitors °-cyclosporine °-digoxin °-diuretics °-ephedrine °-female hormones, like estrogens and birth control pills °-isoniazid °-ketoconazole °-NSAIDS, medicines for pain and inflammation, like ibuprofen or naproxen °-phenytoin °-rifampin °-toxoids °-vaccines °-warfarin °This list may not describe all possible interactions. Give your health care provider a list of all the medicines, herbs, non-prescription drugs, or dietary supplements you use. Also tell them if you smoke, drink alcohol, or use illegal drugs. Some items may interact with your medicine. °What should I watch for while using this medicine? °Visit your doctor or health care professional for regular checks on your progress. If you are taking this medicine over a prolonged period, carry an identification card with your name and address, the type and dose of your medicine, and your doctor's name and address. °This medicine may increase your risk of getting an infection. Tell your doctor or health care professional if you are around anyone with measles or chickenpox, or if you develop sores or blisters that do not heal properly. °If you are going to have  surgery, tell your doctor or health care professional that you have taken this medicine within the last twelve months. °Ask your doctor or health care professional about your diet. You may need to lower the amount of salt you eat. °This medicine may affect blood sugar levels. If you have diabetes, check with your doctor or health care professional before you change your diet or the dose of your diabetic medicine. °What side effects may I notice from receiving this medicine? °Side effects that you should report to your doctor or health care professional as soon as possible: °-allergic reactions like skin rash, itching or hives, swelling of the face, lips, or tongue °-changes in emotions or moods °-changes in vision °-depressed mood °-eye pain °-fever or chills, cough, sore throat, pain or difficulty passing urine °-increased thirst °-swelling of ankles, feet °Side effects that usually do not require medical attention (report to your doctor or health care professional if they continue or are bothersome): °-confusion, excitement, restlessness °-headache °-nausea, vomiting °-skin problems, acne, thin and shiny skin °-trouble sleeping °-weight gain °This list may not describe all possible side effects. Call your doctor   for medical advice about side effects. You may report side effects to FDA at 1-800-FDA-1088. Where should I keep my medicine? Keep out of the reach of children. Store at room temperature between 15 and 30 degrees C (59 and 86 degrees F). Protect from light. Keep container tightly closed. Throw away any unused medicine after the expiration date. NOTE: This sheet is a summary. It may not cover all possible information. If you have questions about this medicine, talk to your doctor, pharmacist, or health care provider.    2016, Elsevier/Gold Standard. (2011-04-17 10:57:14)   Emergency Department Resource Guide 1) Find a Doctor and Pay Out of Pocket Although you won't have to find out who is  covered by your insurance plan, it is a good idea to ask around and get recommendations. You will then need to call the office and see if the doctor you have chosen will accept you as a new patient and what types of options they offer for patients who are self-pay. Some doctors offer discounts or will set up payment plans for their patients who do not have insurance, but you will need to ask so you aren't surprised when you get to your appointment.  2) Contact Your Local Health Department Not all health departments have doctors that can see patients for sick visits, but many do, so it is worth a call to see if yours does. If you don't know where your local health department is, you can check in your phone book. The CDC also has a tool to help you locate your state's health department, and many state websites also have listings of all of their local health departments.  3) Find a Walk-in Clinic If your illness is not likely to be very severe or complicated, you may want to try a walk in clinic. These are popping up all over the country in pharmacies, drugstores, and shopping centers. They're usually staffed by nurse practitioners or physician assistants that have been trained to treat common illnesses and complaints. They're usually fairly quick and inexpensive. However, if you have serious medical issues or chronic medical problems, these are probably not your best option.  No Primary Care Doctor: - Call Health Connect at  [phone] - they can help you locate a primary care doctor that  accepts your insurance, provides certain services, etc. - Physician Referral Service- [phone]  Chronic Pain Problems: Organization         Address  Phone   Notes  [facility]  [phone] Patients need to be referred by their primary care doctor.   Medication Assistance: Organization         Address  Phone   Notes  [facility] [address] [place], [place] [address] [phone] --Must be a resident of [place] [facility] -- Must have NO insurance coverage whatsoever (no Medicaid/ Medicare, etc.) -- The pt. MUST have a primary care doctor that directs their care regularly and follows them in the community   Med[facility]ist  [phone]   [employer]  [phone]    Agencies that provide inexpensive medical care: Organization         Address  Phone   Notes  [facility]  [phone]   [facility]    [phone]   [facility] [address] [place], [place] [address] [phone]   [facility] [address], [place] (  [phone]   Planned Parenthood    [phone]   [facility]    [phone]   [facility] [place] [facility]  [address], [place] Phone:  [phone], Fax:  [phone] Hours of Operation:  9 am - 6 pm, M-F.  Also accepts Medicaid/Medicare and self-pay.  [facility]  [address], [place] Phone: [phone], Fax: [phone]. Hours of Operation:  8:30 am - 5:30 pm, M-F.  Also accepts Medicaid and self-pay.  [facility] [place] [address] [place] Phone: [phone]   [facility] [address] Natasha Bence [place], [place] [phone], Ext. [phone] Mondays & Thursdays: 7-9 AM.  First 15 patients are seen on a first come, first serve basis.    Medicaid-accepting [facility] Providers:  Organization         Address  Phone   Notes  [facility] [address], [place] [phone] Also accepts self-pay patients.  [facility] [address] Laurell Josephs [place], [place]  [phone]   [facility] [address], [place] [phone]   [facility] [address], [place] [phone]   Renaye Rakers [address], [place]   [phone] Only accepts [place] patients after they have their name applied to their card.   Self-Pay (no insurance) in [facility]:  Organization         Address  Phone   Notes  Sickle Cell Patients, [facility] [address] [place], [place] [phone]   [facility] [address] [place], [place] [phone]   [facility]  [address], [place] [phone]   [facility]/Dr. Osei-Bonsu  [address], [place] or [address], [place] [phone] Phone number for both [place] and [place] locations is the same.  [facility] [address] [place] [phone]   [facility] [address], [place] or [address] [phone]   [facility] [address], [place] [phone], phone; [phone], fax Sees patients 1st and 3rd Saturday of every month.  Must not qualify for public or private insurance (i.e. Medicaid, Medicare, [place] Health Choice, Veterans' Benefits)  Household income should be no more than 200% of the poverty level The clinic cannot treat you if you are pregnant or think you are pregnant  Sexually transmitted diseases are not treated at the clinic.    Dental Care: Organization         Address  Phone  Notes  [facility] Department of [facility] [address] [place], [place] [phone] Accepts children up to age 19 who are enrolled in [place] or [place] Health Choice; pregnant women with a Medicaid card; and children who have applied for Medicaid or [place] Health Choice, but were declined, whose parents can pay a reduced fee at time of service.  [facility] Department of [facility]  [address], [place] [phone] Accepts children up to age 26 who are enrolled  in [place] or [place] Health Choice; pregnant women with a Medicaid card; and children who have applied for Medicaid or [place] Health  Choice, but were declined, whose parents can pay a reduced fee at time of service.  [facility]  [address] [place], [place] [phone] Patients are seen by appointment only. Walk-ins are not accepted. [facility] will see patients 45 years of age and older. Monday - Tuesday (8am-5pm) Most Wednesdays (8:30-5pm) $30 per visit, cash only  [facility] Adult [facility] Access [facility]  [address], [address] [facility] [phone] Patients are seen by appointment only. Walk-ins are not accepted. [facility] will see patients 54 years of age and older. One Wednesday Evening (Monthly: Volunteer Based).  $30 per visit, cash only  [employer]  [phone] for adults; Children under age 36, call Graduate Pediatric Dentistry at [phone]. Children aged 90-14, please call [phone] to request a pediatric application.  Dental services are provided in all areas of dental care including fillings, crowns and bridges, complete and partial dentures, implants, gum treatment, root canals, and extractions. Preventive care is also provided. Treatment is provided to both adults and children. Patients are selected via a lottery and there is often a waiting list.   [facility] [address], [place]  [phone] [email or web address]   [facility] [address] [place], [place] [phone], Ext. [phone] Second and Fourth Thursday of each month, opens at 6:30 AM; Clinic ends at 9 AM.  Patients are seen on a first-come first-served basis, and a limited number are seen during each clinic.   [facility]  [address] Ether Griffins [place], [place] [phone]   Eligibility Requirements You must have lived in [place], [place], or [place] counties for at least the last three months.   You cannot be  eligible for state or federal sponsored [place], including [employer], [place], or [employer].   You generally cannot be eligible for healthcare insurance through your employer.    How to apply: Eligibility screenings are held every Tuesday and Wednesday afternoon from 1:00 pm until 4:00 pm. You do not need an appointment for the interview!  [facility] [address] [place], [place] [phone]   [facility] Health Department  [phone]   [facility] Health Department  [phone]   [facility]  [phone]    Behavioral Health Resources in the Community: Intensive Outpatient Programs Organization         Address  Phone  Notes  [facility] [address], [place], [place] [phone]   [facility] Outpatient [address], [place], [place] [phone]   [facility] [address] [place], [place]  [phone]   [facility] Health [address]  [place], [place] [phone] or [phone]   Substance Abuse Resources Organization         Address  Phone  Notes  [facility] Drug Services  [phone]   [facility]  [phone]   [facility] [place]  [phone]   Floydene Flock  [phone]   Residential & Outpatient Substance Abuse Program  [phone]   Psychological Services Organization         Address  Phone  Notes  [facility] Health  [phone]   [facility]  [phone]   [facility] [address], [place] [phone] or [phone]    Mobile Crisis Teams Organization         Address  Phone  Notes  [facility], Mobile Crisis Care Unit  [phone]   [facility]  [address] [place], [place] [phone]   [facility] [address] [place] [phone]    Self-Help/Support Groups Organization          Address  Phone             Notes  [facility] [place] - variety of support groups  [phone] Call for more information  Narcotics Anonymous (NA), [facility] [address], [place]  2 meetings at this location   Statisticianesidential Treatment Programs Organization         Address  Phone  Notes  [facility] [address],    [place]  [phone]   [facility]  [address], [place] [phone], [place], [place] [phone]   [facility] [address], [place] [phone] Admissions: 8am-3pm M-F  [facility] [address]    [place], [place] [phone]   [facility] [address], [place], [place] [phone]   [facility] [address]  [place], [place] [phone]   [facility] - Intensive Outpatient [address], [place], [place] [phone]   [facility] ([facility] [address]  [place], [place] [phone] or [phone]   [facility] [address] [place], [place] [phone] Accepts Medicaid  [facility] [address]  [place] [phone] Substance Abuse/Addiction Treatment   [facility]         Address  Phone  Notes  [facility]  [phone]) [phone]   Angie FavaJulie Brannon, PhD [address], [address] [place], [place]   [phone]) [phone] or [phone]) [phone]   [facility]   [address] [place], [place] [phone]) [phone]   [facility] [address], [place], [place] [phone] Insurance/Medicaid/sponsorship through [facility] [address]                                    [place], [place] [phone] Therapy/tele-psych/case  [facility] [address]   [place], [place] [phone]) [phone]    Dr. Lolly MustacheArfeen  [phone]   [facility]  [employer]  [facility] 1) [address], [place] 2) [address], [place] 3)  [address], [place] [phone]  [phone]) [phone]   [facility] Child Abuse Hotline [phone]) [phone] or [phone]) [phone] (After Hours)

## 2015-07-10 NOTE — ED Notes (Signed)
Patient transported to CT 

## 2015-08-26 ENCOUNTER — Other Ambulatory Visit: Payer: Self-pay | Admitting: Internal Medicine

## 2015-09-16 DIAGNOSIS — J189 Pneumonia, unspecified organism: Secondary | ICD-10-CM

## 2015-09-16 DIAGNOSIS — I2699 Other pulmonary embolism without acute cor pulmonale: Secondary | ICD-10-CM

## 2015-09-16 HISTORY — DX: Pneumonia, unspecified organism: J18.9

## 2015-09-16 HISTORY — DX: Other pulmonary embolism without acute cor pulmonale: I26.99

## 2015-10-25 ENCOUNTER — Inpatient Hospital Stay (HOSPITAL_COMMUNITY)
Admission: EM | Admit: 2015-10-25 | Discharge: 2015-10-27 | DRG: 871 | Disposition: A | Discharge status: Home / Self Care | Payer: Self-pay | Attending: Internal Medicine | Admitting: Internal Medicine

## 2015-10-25 ENCOUNTER — Emergency Department (HOSPITAL_COMMUNITY): Payer: Self-pay

## 2015-10-25 ENCOUNTER — Encounter (HOSPITAL_COMMUNITY): Payer: Self-pay

## 2015-10-25 ENCOUNTER — Emergency Department (HOSPITAL_COMMUNITY)
Admission: EM | Admit: 2015-10-25 | Discharge: 2015-10-25 | Disposition: A | Discharge status: Home / Self Care | Payer: Self-pay | Attending: Emergency Medicine | Admitting: Emergency Medicine

## 2015-10-25 ENCOUNTER — Encounter (HOSPITAL_COMMUNITY): Payer: Self-pay | Admitting: Emergency Medicine

## 2015-10-25 DIAGNOSIS — F17210 Nicotine dependence, cigarettes, uncomplicated: Secondary | ICD-10-CM | POA: Insufficient documentation

## 2015-10-25 DIAGNOSIS — Z79899 Other long term (current) drug therapy: Secondary | ICD-10-CM | POA: Insufficient documentation

## 2015-10-25 DIAGNOSIS — J189 Pneumonia, unspecified organism: Secondary | ICD-10-CM | POA: Diagnosis present

## 2015-10-25 DIAGNOSIS — Z7952 Long term (current) use of systemic steroids: Secondary | ICD-10-CM | POA: Insufficient documentation

## 2015-10-25 DIAGNOSIS — E669 Obesity, unspecified: Secondary | ICD-10-CM | POA: Diagnosis present

## 2015-10-25 DIAGNOSIS — Z6841 Body Mass Index (BMI) 40.0 and over, adult: Secondary | ICD-10-CM

## 2015-10-25 DIAGNOSIS — J1001 Influenza due to other identified influenza virus with the same other identified influenza virus pneumonia: Secondary | ICD-10-CM | POA: Diagnosis present

## 2015-10-25 DIAGNOSIS — A419 Sepsis, unspecified organism: Principal | ICD-10-CM | POA: Insufficient documentation

## 2015-10-25 DIAGNOSIS — R7989 Other specified abnormal findings of blood chemistry: Secondary | ICD-10-CM | POA: Insufficient documentation

## 2015-10-25 DIAGNOSIS — Z87440 Personal history of urinary (tract) infections: Secondary | ICD-10-CM | POA: Insufficient documentation

## 2015-10-25 DIAGNOSIS — R000 Tachycardia, unspecified: Secondary | ICD-10-CM | POA: Insufficient documentation

## 2015-10-25 DIAGNOSIS — Z86711 Personal history of pulmonary embolism: Secondary | ICD-10-CM | POA: Diagnosis present

## 2015-10-25 DIAGNOSIS — J45901 Unspecified asthma with (acute) exacerbation: Secondary | ICD-10-CM | POA: Diagnosis present

## 2015-10-25 DIAGNOSIS — F1290 Cannabis use, unspecified, uncomplicated: Secondary | ICD-10-CM | POA: Diagnosis present

## 2015-10-25 DIAGNOSIS — J101 Influenza due to other identified influenza virus with other respiratory manifestations: Secondary | ICD-10-CM | POA: Diagnosis present

## 2015-10-25 LAB — CBC
HEMATOCRIT: 38.4 % (ref 36.0–46.0)
Hemoglobin: 13.2 g/dL (ref 12.0–15.0)
MCH: 28.9 pg (ref 26.0–34.0)
MCHC: 34.4 g/dL (ref 30.0–36.0)
MCV: 84.2 fL (ref 78.0–100.0)
Platelets: 277 10*3/uL (ref 150–400)
RBC: 4.56 MIL/uL (ref 3.87–5.11)
RDW: 14.3 % (ref 11.5–15.5)
WBC: 10.8 10*3/uL — AB (ref 4.0–10.5)

## 2015-10-25 LAB — I-STAT TROPONIN, ED: Troponin i, poc: 0 ng/mL (ref 0.00–0.08)

## 2015-10-25 LAB — BASIC METABOLIC PANEL
ANION GAP: 13 (ref 5–15)
BUN: 6 mg/dL (ref 6–20)
CALCIUM: 9.3 mg/dL (ref 8.9–10.3)
CO2: 19 mmol/L — AB (ref 22–32)
Chloride: 104 mmol/L (ref 101–111)
Creatinine, Ser: 0.88 mg/dL (ref 0.44–1.00)
Glucose, Bld: 127 mg/dL — ABNORMAL HIGH (ref 65–99)
Potassium: 4.1 mmol/L (ref 3.5–5.1)
SODIUM: 136 mmol/L (ref 135–145)

## 2015-10-25 LAB — D-DIMER, QUANTITATIVE (NOT AT ARMC): D DIMER QUANT: 0.56 ug{FEU}/mL — AB (ref 0.00–0.50)

## 2015-10-25 MED ORDER — DEXAMETHASONE SODIUM PHOSPHATE 10 MG/ML IJ SOLN
10.0000 mg | Freq: Once | INTRAMUSCULAR | Status: AC
  Administered 2015-10-26: 10 mg via INTRAVENOUS
  Filled 2015-10-25: qty 1

## 2015-10-25 MED ORDER — PREDNISONE 20 MG PO TABS: 60.0000 mg | ORAL_TABLET | Freq: Every day | ORAL | Status: DC

## 2015-10-25 MED ORDER — ALBUTEROL (5 MG/ML) CONTINUOUS INHALATION SOLN
10.0000 mg/h | INHALATION_SOLUTION | RESPIRATORY_TRACT | Status: DC
  Administered 2015-10-25: 10 mg/h via RESPIRATORY_TRACT
  Filled 2015-10-25: qty 20

## 2015-10-25 MED ORDER — MAGNESIUM SULFATE 2 GM/50ML IV SOLN
2.0000 g | Freq: Once | INTRAVENOUS | Status: AC
  Administered 2015-10-25: 2 g via INTRAVENOUS
  Filled 2015-10-25: qty 50

## 2015-10-25 MED ORDER — ALBUTEROL SULFATE HFA 108 (90 BASE) MCG/ACT IN AERS
2.0000 | INHALATION_SPRAY | Freq: Once | RESPIRATORY_TRACT | Status: AC
  Administered 2015-10-25: 2 via RESPIRATORY_TRACT
  Filled 2015-10-25: qty 6.7

## 2015-10-25 MED ORDER — SODIUM CHLORIDE 0.9 % IV BOLUS (SEPSIS)
1000.0000 mL | Freq: Once | INTRAVENOUS | Status: AC
  Administered 2015-10-25: 1000 mL via INTRAVENOUS

## 2015-10-25 MED ORDER — ALBUTEROL SULFATE (2.5 MG/3ML) 0.083% IN NEBU
INHALATION_SOLUTION | RESPIRATORY_TRACT | Status: AC
  Filled 2015-10-25: qty 6

## 2015-10-25 MED ORDER — IPRATROPIUM-ALBUTEROL 0.5-2.5 (3) MG/3ML IN SOLN
3.0000 mL | Freq: Once | RESPIRATORY_TRACT | Status: AC
  Administered 2015-10-26: 3 mL via RESPIRATORY_TRACT
  Filled 2015-10-25: qty 3

## 2015-10-25 MED ORDER — ALBUTEROL SULFATE (2.5 MG/3ML) 0.083% IN NEBU
5.0000 mg | INHALATION_SOLUTION | Freq: Once | RESPIRATORY_TRACT | Status: AC
  Administered 2015-10-25: 5 mg via RESPIRATORY_TRACT

## 2015-10-25 NOTE — ED Provider Notes (Signed)
CSN: [id number]     Arrival date & time 10/25/15  0204 History  By signing my name below, I, Bethel Born, attest that this documentation has been prepared under the direction and in the presence of Tomasita Crumble, MD. Electronically Signed: Bethel Born, ED Scribe. 10/25/2015. 2:59 AM     Chief Complaint  Patient presents with  . Asthma   The history is provided by the patient. No language interpreter was used.   Brought in by EMS, Kara Haynes is a 25 y.o. female with history of asthma who presents to the Emergency Department complaining of constant and worsening chest tightness with gradual onset yesterday. She is unsure what triggered her symptoms but believes that her asthma is commonly exacerbated by environmental allergies. Pt states that her home inhalers and nebulizer treatments seemed to exacerbate her symptoms but she had some improvement after a breathing treatment and 125 mg of Solumedrol by EMS. Pt denies fever and productive cough.  She has had no known sick contact.   Past Medical History  Diagnosis Date  . Asthma   . Tobacco abuse   . Seasonal allergies   . UTI (lower urinary tract infection)   . Obesity   . PE (pulmonary embolism)    History reviewed. No pertinent past surgical history. Family History  Problem Relation Age of Onset  . Other      denies family h/o clotting d/o   Social History  Substance Use Topics  . Smoking status: Current Some Day Smoker -- 0.50 packs/day    Types: Cigarettes  . Smokeless tobacco: None     Comment: smoking less 3 cigarettes aday  . Alcohol Use: 0.0 oz/week    0 Standard drinks or equivalent per week     Comment: occ   OB History    No data available     Review of Systems  10 Systems reviewed and all are negative for acute change except as noted in the HPI.   Allergies  Review of patient's allergies indicates no known allergies.  Home Medications   Prior to Admission medications   Medication Sig  Start Date End Date Taking? Authorizing Provider  predniSONE (DELTASONE) 20 MG tablet Take 2 tablets (40 mg total) by mouth daily. 07/10/15   Dione Booze, MD  PROAIR HFA 108 (90 BASE) MCG/ACT inhaler INHALE 1-2 PUFFS INTO THE LUNGS EVERY 6 HOURS AS NEEDED. 08/27/15   Ejiroghene E Emokpae, MD   BP 122/63 mmHg  Pulse 122  Temp(Src) 99.3 F (37.4 C) (Oral)  Resp 20  Ht  (1.575 m)  SpO2 100%  LMP 10/25/2015 Physical Exam  Constitutional: She is oriented to person, place, and time. She appears well-developed and well-nourished. No distress.  HENT:  Head: Normocephalic and atraumatic.  Nose: Nose normal.  Mouth/Throat: Oropharynx is clear and moist. No oropharyngeal exudate.  Eyes: Conjunctivae and EOM are normal. Pupils are equal, round, and reactive to light. No scleral icterus.  Neck: Normal range of motion. Neck supple. No JVD present. No tracheal deviation present. No thyromegaly present.  Cardiovascular: Regular rhythm and normal heart sounds.  Tachycardia present.  Exam reveals no gallop and no friction rub.   No murmur heard. Pulmonary/Chest: Tachypnea noted. She is in respiratory distress. She has wheezes. She exhibits no tenderness.  Abdominal: Soft. Bowel sounds are normal. She exhibits no distension and no mass. There is no tenderness. There is no rebound and no guarding.  Musculoskeletal: Normal range of motion. She exhibits no edema  or tenderness.  Lymphadenopathy:    She has no cervical adenopathy.  Neurological: She is alert and oriented to person, place, and time. No cranial nerve deficit. She exhibits normal muscle tone.  Skin: Skin is warm and dry. No rash noted. No erythema. No pallor.  Nursing note and vitals reviewed.   ED Course  Procedures (including critical care time) DIAGNOSTIC STUDIES: Oxygen Saturation is 100% on RA,  normal by my interpretation.    COORDINATION OF CARE: 2:41 AM Discussed treatment plan which includes CXR, a breathing treatment, IVF  and magnesium sulfate with pt at bedside and pt agreed to plan.  Labs Review Labs Reviewed - No data to display  Imaging Review Dg Chest 2 View  10/25/2015  CLINICAL DATA:  25 year old female with shortness of breath EXAM: CHEST  2 VIEW COMPARISON:  Chest CT dated 07/10/2015 FINDINGS: The heart size and mediastinal contours are within normal limits. Both lungs are clear. The visualized skeletal structures are unremarkable. IMPRESSION: No active cardiopulmonary disease. Electronically Signed   By: Elgie Collard M.D.   On: 10/25/2015 02:39   I have personally reviewed and evaluated these images as part of my medical decision-making.   EKG Interpretation None      MDM   Final diagnoses:  None    Patient presents to the ED for asthma exacerbation.  CXR is negative for pneumonia. She was given continuous albuterol treatment, steroids, IV fluids, magnesium. She was observed throughout the night in the emergency department. She is not tachycardic to the 120s, likely due to albuterol. Upon repeat evaluation, wheezing is significantly improved, patient states she feels like she is at her baseline. We'll discharge home with albuterol inhaler, prednisone for 4 more days, and primary care follow-up within 3 days. She appears well and in no acute distress, vital signs remain within her normal limits and she is safe for discharge.   I personally performed the services described in this documentation, which was scribed in my presence. The recorded information has been reviewed and is accurate.     Tomasita Crumble, MD 10/25/15 [phone]

## 2015-10-25 NOTE — ED Notes (Signed)
Pt departed in NAD.  

## 2015-10-25 NOTE — ED Notes (Signed)
Patient seen here last night and CXR done.   History of asthma.  Tonight c.o sharp CP and continues to have trouble breathing.

## 2015-10-25 NOTE — Discharge Instructions (Signed)
Asthma Attack Prevention Ms. Wright-Madkins, take steroids every day to complete your treatment. Use albuterol inhaler as needed. You need to see a primary care doctor within 3 days for close follow-up, if any symptoms worsen come back to emergency department and medially. Thank you. While you may not be able to control the fact that you have asthma, you can take actions to prevent asthma attacks. The best way to prevent asthma attacks is to maintain good control of your asthma. You can achieve this by:  Taking your medicines as directed.  Avoiding things that can irritate your airways or make your asthma symptoms worse (asthma triggers).  Keeping track of how well your asthma is controlled and of any changes in your symptoms.  Responding quickly to worsening asthma symptoms (asthma attack).  Seeking emergency care when it is needed. WHAT ARE SOME WAYS TO PREVENT AN ASTHMA ATTACK? Have a Plan Work with your health care provider to create a written plan for managing and treating your asthma attacks (asthma action plan). This plan includes:  A list of your asthma triggers and how you can avoid them.  Information on when medicines should be taken and when their dosages should be changed.  The use of a device that measures how well your lungs are working (peak flow meter). Monitor Your Asthma Use your peak flow meter and record your results in a journal every day. A drop in your peak flow numbers on one or more days may indicate the start of an asthma attack. This can happen even before you start to feel symptoms. You can prevent an asthma attack from getting worse by following the steps in your asthma action plan. Avoid Asthma Triggers Work with your asthma health care provider to find out what your asthma triggers are. This can be done by:  Allergy testing.  Keeping a journal that notes when asthma attacks occur and the factors that may have contributed to them.  Determining if there are  other medical conditions that are making your asthma worse. Once you have determined your asthma triggers, take steps to avoid them. This may include avoiding excessive or prolonged exposure to:  Dust. Have someone dust and vacuum your home for you once or twice a week. Using a high-efficiency particulate arrestance (HEPA) vacuum is best.  Smoke. This includes campfire smoke, forest fire smoke, and secondhand smoke from tobacco products.  Pet dander. Avoid contact with animals that you know you are allergic to.  Allergens from trees, grasses or pollens. Avoid spending a lot of time outdoors when pollen counts are high, and on very windy days.  Very cold, dry, or humid air.  Mold.  Foods that contain high amounts of sulfites.  Strong odors.  Outdoor air pollutants, such as Museum/gallery exhibitions officer.  Indoor air pollutants, such as aerosol sprays and fumes from household cleaners.  Household pests, including dust mites and cockroaches, and pest droppings.  Certain medicines, including NSAIDs. Always talk to your health care provider before stopping or starting any new medicines. Medicines Take over-the-counter and prescription medicines only as told by your health care provider. Many asthma attacks can be prevented by carefully following your medicine schedule. Taking your medicines correctly is especially important when you cannot avoid certain asthma triggers. Act Quickly If an asthma attack does happen, acting quickly can decrease how severe it is and how long it lasts. Take these steps:   Pay attention to your symptoms. If you are coughing, wheezing, or having difficulty breathing, do  not wait to see if your symptoms go away on their own. Follow your asthma action plan.  If you have followed your asthma action plan and your symptoms are not improving, call your health care provider or seek immediate medical care at the nearest hospital. It is important to note how often you need to use your  fast-acting rescue inhaler. If you are using your rescue inhaler more often, it may mean that your asthma is not under control. Adjusting your asthma treatment plan may help you to prevent future asthma attacks and help you to gain better control of your condition. HOW CAN I PREVENT AN ASTHMA ATTACK WHEN I EXERCISE? Follow advice from your health care provider about whether you should use your fast-acting inhaler before exercising. Many people with asthma experience exercise-induced bronchoconstriction (EIB). This condition often worsens during vigorous exercise in cold, humid, or dry environments. Usually, people with EIB can stay very active by pre-treating with a fast-acting inhaler before exercising.   This information is not intended to replace advice given to you by your health care provider. Make sure you discuss any questions you have with your health care provider.   Document Released: 08/20/2009 Document Revised: 05/23/2015 Document Reviewed: 02/01/2015 Elsevier Interactive Patient Education [employer]

## 2015-10-25 NOTE — ED Notes (Signed)
Pt from home by [facility] with asthma attack. Pt used inhaler several time with no relief. Pt has had 5 mg Albuterol and 5 mg in progress with 1 of Atrovent. 125 mg of solumedrol.

## 2015-10-25 NOTE — ED Provider Notes (Signed)
CSN: [id number]     Arrival date & time 10/25/15  2110 History   First MD Initiated Contact with Patient 10/25/15 2251     Chief Complaint  Patient presents with  . Shortness of Breath  . Chest Pain     (Consider location/radiation/quality/duration/timing/severity/associated sxs/prior Treatment) HPI Kara Haynes is a 25 y.o. female history of asthma, pulmonary embolism, comes in for evaluation of shortness of breath and chest pain. Patient reports he was seen yesterday for asthma exacerbation, has been using her albuterol inhaler and taking her prescribed steroids, but chest discomfort and shortness of breath are worse now. She denies any leg swelling, recent travel/surgeries/immobilizations, fevers, sick contacts, hemoptysis. States her discomfort does not feel similar to previous PE.  Past Medical History  Diagnosis Date  . Asthma   . Tobacco abuse   . Seasonal allergies   . UTI (lower urinary tract infection)   . Obesity   . PE (pulmonary embolism)    History reviewed. No pertinent past surgical history. Family History  Problem Relation Age of Onset  . Other      denies family h/o clotting d/o   Social History  Substance Use Topics  . Smoking status: Current Some Day Smoker -- 0.50 packs/day    Types: Cigarettes  . Smokeless tobacco: None     Comment: smoking less 3 cigarettes aday  . Alcohol Use: 0.0 oz/week    0 Standard drinks or equivalent per week     Comment: occ   OB History    No data available     Review of Systems A 10 point review of systems was completed and was negative except for pertinent positives and negatives as mentioned in the history of present illness     Allergies  Review of patient's allergies indicates no known allergies.  Home Medications   Prior to Admission medications   Medication Sig Start Date End Date Taking? Authorizing Provider  predniSONE (DELTASONE) 20 MG tablet Take 3 tablets (60 mg total) by mouth daily.  10/25/15  Yes Tomasita Crumble, MD  PROAIR HFA 108 (90 BASE) MCG/ACT inhaler INHALE 1-2 PUFFS INTO THE LUNGS EVERY 6 HOURS AS NEEDED. 08/27/15  Yes Ejiroghene E Emokpae, MD   BP 132/59 mmHg  Pulse 119  Temp(Src) 99.5 F (37.5 C) (Oral)  Resp 30  Ht 5\' 2"  (1.575 m)  Wt 99.791 kg  BMI 40.23 kg/m2  SpO2 98%  LMP 10/25/2015 Physical Exam  Constitutional: She is oriented to person, place, and time. She appears well-developed and well-nourished.  HENT:  Head: Normocephalic and atraumatic.  Mouth/Throat: Oropharynx is clear and moist.  Eyes: Conjunctivae are normal. Pupils are equal, round, and reactive to light. Right eye exhibits no discharge. Left eye exhibits no discharge. No scleral icterus.  Neck: Normal range of motion. Neck supple.  Cardiovascular: Normal rate, regular rhythm and normal heart sounds.   Pulmonary/Chest: Effort normal. She has no wheezes.  Diffuse wheezing throughout, mild rales. No other adventitious lung sounds. No evidence of respiratory distress.  Abdominal: Soft. There is no tenderness.  Musculoskeletal: She exhibits no edema or tenderness.  Neurological: She is alert and oriented to person, place, and time.  Cranial Nerves II-XII grossly intact  Skin: Skin is warm and dry. No rash noted.  Psychiatric: She has a normal mood and affect.  Nursing note and vitals reviewed.   ED Course  Procedures (including critical care time) Labs Review Labs Reviewed  BASIC METABOLIC PANEL - Abnormal; Notable for the  following:    CO2 19 (*)    Glucose, Bld 127 (*)    All other components within normal limits  CBC - Abnormal; Notable for the following:    WBC 10.8 (*)    All other components within normal limits  D-DIMER, QUANTITATIVE (NOT AT [facility]) - Abnormal; Notable for the following:    D-Dimer, Quant 0.56 (*)    All other components within normal limits  PROCALCITONIN  MAGNESIUM  HEPATIC FUNCTION PANEL  HIV ANTIBODY (ROUTINE TESTING)  URINE RAPID DRUG SCREEN, HOSP  PERFORMED  INFLUENZA PANEL BY PCR (TYPE A & B, H1N1)  HEPATITIS C ANTIBODY (REFLEX)  STREP PNEUMONIAE URINARY ANTIGEN  I-STAT TROPOININ, ED    Imaging Review Dg Chest 2 View  10/25/2015  CLINICAL DATA:  Acute onset of sharp left-sided chest pain. Shortness of breath. Initial encounter. EXAM: CHEST  2 VIEW COMPARISON:  Chest radiograph performed 10/25/2015 FINDINGS: The lungs are well-aerated and clear. There is no evidence of focal opacification, pleural effusion or pneumothorax. The heart is normal in size; the mediastinal contour is within normal limits. No acute osseous abnormalities are seen. IMPRESSION: No acute cardiopulmonary process seen. Electronically Signed   By: Roanna Raider M.D.   On: 10/25/2015 22:04   Dg Chest 2 View  10/25/2015  CLINICAL DATA:  25 year old female with shortness of breath EXAM: CHEST  2 VIEW COMPARISON:  Chest CT dated 07/10/2015 FINDINGS: The heart size and mediastinal contours are within normal limits. Both lungs are clear. The visualized skeletal structures are unremarkable. IMPRESSION: No active cardiopulmonary disease. Electronically Signed   By: Elgie Collard M.D.   On: 10/25/2015 02:39   Ct Angio Chest Pe W/cm &/or Wo Cm  10/26/2015  CLINICAL DATA:  25 year old female with shortness of breath and elevated D-dimer EXAM: CT ANGIOGRAPHY CHEST WITH CONTRAST TECHNIQUE: Multidetector CT imaging of the chest was performed using the standard protocol during bolus administration of intravenous contrast. Multiplanar CT image reconstructions and MIPs were obtained to evaluate the vascular anatomy. CONTRAST:  80mL OMNIPAQUE IOHEXOL 350 MG/ML SOLN COMPARISON:  Chest radiograph dated 10/25/2015 FINDINGS: There are scattered patchy areas of ground-glass opacity predominantly involving the subpleural lung. Right upper lobe scattered ground-glass density as well as patchy area of hazy density at the left lung base noted. Findings likely represent an atypical pneumonia or  related to other infectious/inflammatory processes. There is no pleural effusion or pneumothorax. The central airways are patent. The thoracic aorta appears unremarkable. No CT evidence of pulmonary embolism. There is right hilar adenopathy. No cardiomegaly or pericardial effusion. Esophagus is grossly unremarkable. No thyroid nodule identified. There is no axillary adenopathy. The chest wall soft tissues appear unremarkable with the osseous structures are intact. The visualized upper abdomen is grossly unremarkable. Review of the MIP images confirms the above findings. IMPRESSION: No CT evidence of pulmonary embolism. Bilateral patchy airspace ground-glass densities concerning for an atypical pneumonia. Other inflammatory processes are not excluded. Correlation with clinical exam, and pulmonary consult is advised. Electronically Signed   By: Elgie Collard M.D.   On: 10/26/2015 00:39   I have personally reviewed and evaluated these images and lab results as part of my medical decision-making.   EKG Interpretation   Date/Time:  Thursday October 25 2015 21:18:41 EST Ventricular Rate:  120 PR Interval:  126 QRS Duration: 70 QT Interval:  302 QTC Calculation: 426 R Axis:   67 Text Interpretation:  Sinus tachycardia Nonspecific ST and T wave  abnormality Abnormal ECG ED PHYSICIAN  INTERPRETATION AVAILABLE IN [facility] Confirmed by TEST, Record ([id number]) on 10/26/2015 7:17:41 AM     Meds given in ED:  Medications  cefTRIAXone (ROCEPHIN) 1 g in dextrose 5 % 50 mL IVPB (not administered)  azithromycin (ZITHROMAX) 500 mg in dextrose 5 % 250 mL IVPB (not administered)  sodium chloride 0.9 % bolus 1,000 mL (not administered)  enoxaparin (LOVENOX) injection 40 mg (not administered)  acetaminophen (TYLENOL) tablet 650 mg (not administered)    Or  acetaminophen (TYLENOL) suppository 650 mg (not administered)  senna-docusate (Senokot-S) tablet 1 tablet (not administered)  ondansetron (ZOFRAN)  tablet 4 mg (not administered)    Or  ondansetron (ZOFRAN) injection 4 mg (not administered)  ipratropium-albuterol (DUONEB) 0.5-2.5 (3) MG/3ML nebulizer solution 3 mL (not administered)  albuterol (PROVENTIL) (2.5 MG/3ML) 0.083% nebulizer solution 5 mg (not administered)  albuterol (PROVENTIL) (2.5 MG/3ML) 0.083% nebulizer solution 5 mg (5 mg Nebulization Given 10/25/15 2122)  ipratropium-albuterol (DUONEB) 0.5-2.5 (3) MG/3ML nebulizer solution 3 mL (3 mLs Nebulization Given 10/26/15 0001)  dexamethasone (DECADRON) injection 10 mg (10 mg Intravenous Given 10/26/15 0001)  iohexol (OMNIPAQUE) 350 MG/ML injection 80 mL (80 mLs Intravenous Contrast Given 10/26/15 0019)    New Prescriptions   No medications on file   Filed Vitals:   10/25/15 2118 10/25/15 2245 10/25/15 2315 10/26/15 0033  BP: 131/77 103/54 106/62 132/59  Pulse: 125 122 113 119  Temp: 99.5 F (37.5 C)     TempSrc: Oral     Resp: 25 43 37 30  Height:  (1.575 m)     Weight: 99.791 kg     SpO2: 94% 98% 98% 98%    MDM  Adrien J Haynes is a 25 y.o. female history of asthma and PE who comes in for evaluation of shortness of breath and chest pain. Patient was seen earlier and diagnosed with asthma exacerbation and discharged with prednisone. She reports she has also been using her albuterol inhaler without relief. On arrival, she is tachycardic at 125, oxygen saturations are 94% on room air and otherwise hemodynamically stable. On exam, she is wheezing diffusely but no overt respiratory distress. Given breathing treatment, IM Decadron. Given persistent tachycardia, history of PE, obtained d-dimer which was elevated. Pending CT angiogram of the chest. Patient's symptoms not improving with outpatient therapy, believe she would benefit from admission for asthma exacerbation provided CT of chest shows evidence of pneumonia. Started IV abx in ED for CAP. Discussed with Hospitalist, Dr. Julian Reil to see. Patient to be  admitted Final diagnoses:  CAP (community acquired pneumonia)  Asthma exacerbation      Joycie Peek, PA-C 10/26/15 2140  Laurence Spates, MD 10/26/15 [phone]

## 2015-10-25 NOTE — ED Notes (Signed)
Pt was seen here this am for acute asthma and given breathing treatment and steroids. This afternoon started having tightness in chest and sob. Pt has wheezing and labored respirations.

## 2015-10-26 ENCOUNTER — Emergency Department (HOSPITAL_COMMUNITY): Payer: Self-pay

## 2015-10-26 ENCOUNTER — Encounter (HOSPITAL_COMMUNITY): Payer: Self-pay

## 2015-10-26 DIAGNOSIS — Z86711 Personal history of pulmonary embolism: Secondary | ICD-10-CM | POA: Diagnosis present

## 2015-10-26 DIAGNOSIS — J45901 Unspecified asthma with (acute) exacerbation: Secondary | ICD-10-CM

## 2015-10-26 DIAGNOSIS — F1210 Cannabis abuse, uncomplicated: Secondary | ICD-10-CM

## 2015-10-26 DIAGNOSIS — F1290 Cannabis use, unspecified, uncomplicated: Secondary | ICD-10-CM | POA: Diagnosis present

## 2015-10-26 DIAGNOSIS — J189 Pneumonia, unspecified organism: Secondary | ICD-10-CM

## 2015-10-26 DIAGNOSIS — R7989 Other specified abnormal findings of blood chemistry: Secondary | ICD-10-CM | POA: Insufficient documentation

## 2015-10-26 DIAGNOSIS — J101 Influenza due to other identified influenza virus with other respiratory manifestations: Secondary | ICD-10-CM

## 2015-10-26 DIAGNOSIS — R791 Abnormal coagulation profile: Secondary | ICD-10-CM

## 2015-10-26 LAB — RAPID URINE DRUG SCREEN, HOSP PERFORMED
Amphetamines: NOT DETECTED
Barbiturates: NOT DETECTED
Benzodiazepines: NOT DETECTED
COCAINE: NOT DETECTED
OPIATES: NOT DETECTED
TETRAHYDROCANNABINOL: POSITIVE — AB

## 2015-10-26 LAB — PROCALCITONIN

## 2015-10-26 LAB — INFLUENZA PANEL BY PCR (TYPE A & B)
H1N1FLUPCR: NOT DETECTED
INFLBPCR: NEGATIVE
Influenza A By PCR: POSITIVE — AB

## 2015-10-26 LAB — HEPATIC FUNCTION PANEL
ALT: 13 U/L — ABNORMAL LOW (ref 14–54)
AST: 22 U/L (ref 15–41)
Albumin: 3.7 g/dL (ref 3.5–5.0)
Alkaline Phosphatase: 46 U/L (ref 38–126)
BILIRUBIN DIRECT: 0.2 mg/dL (ref 0.1–0.5)
BILIRUBIN INDIRECT: 0.6 mg/dL (ref 0.3–0.9)
BILIRUBIN TOTAL: 0.8 mg/dL (ref 0.3–1.2)
Total Protein: 7.1 g/dL (ref 6.5–8.1)

## 2015-10-26 LAB — STREP PNEUMONIAE URINARY ANTIGEN: STREP PNEUMO URINARY ANTIGEN: NEGATIVE

## 2015-10-26 LAB — MAGNESIUM: MAGNESIUM: 2.4 mg/dL (ref 1.7–2.4)

## 2015-10-26 LAB — HIV ANTIBODY (ROUTINE TESTING W REFLEX): HIV SCREEN 4TH GENERATION: NONREACTIVE

## 2015-10-26 MED ORDER — DEXTROSE 5 % IV SOLN
1.0000 g | INTRAVENOUS | Status: DC
  Administered 2015-10-26: 1 g via INTRAVENOUS
  Filled 2015-10-26 (×2): qty 10

## 2015-10-26 MED ORDER — ONDANSETRON HCL 4 MG PO TABS
4.0000 mg | ORAL_TABLET | Freq: Four times a day (QID) | ORAL | Status: DC | PRN
Start: 2015-10-26 — End: 2015-10-27

## 2015-10-26 MED ORDER — PNEUMOCOCCAL VAC POLYVALENT 25 MCG/0.5ML IJ INJ
0.5000 mL | INJECTION | INTRAMUSCULAR | Status: AC
  Administered 2015-10-27: 0.5 mL via INTRAMUSCULAR
  Filled 2015-10-26: qty 0.5

## 2015-10-26 MED ORDER — ACETAMINOPHEN 650 MG RE SUPP: 650.0000 mg | Freq: Four times a day (QID) | RECTAL | Status: DC | PRN

## 2015-10-26 MED ORDER — IOHEXOL 350 MG/ML SOLN
80.0000 mL | Freq: Once | INTRAVENOUS | Status: AC | PRN
  Administered 2015-10-26: 80 mL via INTRAVENOUS

## 2015-10-26 MED ORDER — ACETAMINOPHEN 325 MG PO TABS: 650.0000 mg | ORAL_TABLET | Freq: Four times a day (QID) | ORAL | Status: DC | PRN

## 2015-10-26 MED ORDER — IPRATROPIUM-ALBUTEROL 0.5-2.5 (3) MG/3ML IN SOLN: 3.0000 mL | RESPIRATORY_TRACT | Status: DC

## 2015-10-26 MED ORDER — DEXTROSE 5 % IV SOLN
1.0000 g | Freq: Once | INTRAVENOUS | Status: AC
  Administered 2015-10-26: 1 g via INTRAVENOUS
  Filled 2015-10-26: qty 10

## 2015-10-26 MED ORDER — SODIUM CHLORIDE 0.9 % IV BOLUS (SEPSIS)
1000.0000 mL | Freq: Once | INTRAVENOUS | Status: AC
  Administered 2015-10-26: 1000 mL via INTRAVENOUS

## 2015-10-26 MED ORDER — DEXTROSE 5 % IV SOLN
500.0000 mg | Freq: Once | INTRAVENOUS | Status: AC
  Administered 2015-10-26: 500 mg via INTRAVENOUS
  Filled 2015-10-26: qty 500

## 2015-10-26 MED ORDER — AZITHROMYCIN 500 MG PO TABS
500.0000 mg | ORAL_TABLET | Freq: Every day | ORAL | Status: DC
  Administered 2015-10-26: 500 mg via ORAL
  Filled 2015-10-26: qty 1

## 2015-10-26 MED ORDER — ONDANSETRON HCL 4 MG/2ML IJ SOLN: 4.0000 mg | Freq: Four times a day (QID) | INTRAMUSCULAR | Status: DC | PRN

## 2015-10-26 MED ORDER — IPRATROPIUM-ALBUTEROL 0.5-2.5 (3) MG/3ML IN SOLN
3.0000 mL | RESPIRATORY_TRACT | Status: DC
  Administered 2015-10-26: 3 mL via RESPIRATORY_TRACT
  Filled 2015-10-26: qty 3

## 2015-10-26 MED ORDER — ALBUTEROL SULFATE (2.5 MG/3ML) 0.083% IN NEBU
5.0000 mg | INHALATION_SOLUTION | RESPIRATORY_TRACT | Status: DC | PRN
  Administered 2015-10-26: 5 mg via RESPIRATORY_TRACT
  Filled 2015-10-26: qty 6

## 2015-10-26 MED ORDER — ENOXAPARIN SODIUM 40 MG/0.4ML ~~LOC~~ SOLN
40.0000 mg | SUBCUTANEOUS | Status: DC
Start: 2015-10-26 — End: 2015-10-27
  Administered 2015-10-26: 40 mg via SUBCUTANEOUS
  Filled 2015-10-26: qty 0.4

## 2015-10-26 MED ORDER — IPRATROPIUM-ALBUTEROL 0.5-2.5 (3) MG/3ML IN SOLN
3.0000 mL | Freq: Three times a day (TID) | RESPIRATORY_TRACT | Status: DC
  Administered 2015-10-26 – 2015-10-27 (×4): 3 mL via RESPIRATORY_TRACT
  Filled 2015-10-26 (×5): qty 3

## 2015-10-26 MED ORDER — PREDNISONE 50 MG PO TABS
50.0000 mg | ORAL_TABLET | Freq: Every day | ORAL | Status: DC
  Administered 2015-10-26 – 2015-10-27 (×2): 50 mg via ORAL
  Filled 2015-10-26 (×2): qty 1

## 2015-10-26 MED ORDER — INFLUENZA VAC SPLIT QUAD 0.5 ML IM SUSY
0.5000 mL | PREFILLED_SYRINGE | INTRAMUSCULAR | Status: AC
  Administered 2015-10-27: 0.5 mL via INTRAMUSCULAR
  Filled 2015-10-26: qty 0.5

## 2015-10-26 MED ORDER — SENNOSIDES-DOCUSATE SODIUM 8.6-50 MG PO TABS
1.0000 | ORAL_TABLET | Freq: Every evening | ORAL | Status: DC | PRN
  Filled 2015-10-26: qty 1

## 2015-10-26 MED ORDER — DEXTROSE 5 % IV SOLN
500.0000 mg | INTRAVENOUS | Status: DC
  Filled 2015-10-26: qty 500

## 2015-10-26 MED ORDER — OSELTAMIVIR PHOSPHATE 75 MG PO CAPS
75.0000 mg | ORAL_CAPSULE | Freq: Two times a day (BID) | ORAL | Status: DC
  Administered 2015-10-26 – 2015-10-27 (×3): 75 mg via ORAL
  Filled 2015-10-26 (×4): qty 1

## 2015-10-26 NOTE — ED Notes (Signed)
Pt sts she feels SOB.  PRN albuterol treatment provided.

## 2015-10-26 NOTE — Progress Notes (Signed)
   [facility]                                                                              [name]  Kara Haynes, is a 25 y.o. female, DOB - [birth year], UJW:[id number]  Admit date - 10/25/2015   Admitting Physician Hillary Bow, DO  Outpatient Primary MD for the patient is No primary care provider on file.  LOS - 0   Chief Complaint  Patient presents with  . Shortness of Breath  . Chest Pain      HPI on 10/26/15 by Dr. Lyda Perone Kara Haynes is a 24 y.o. female with h/o asthma, ongoing tobacco abuse. Patient presents to the ED with acute asthma exacerbation. States she was seen this morning here for same. Albuterol and steroids given but symptoms got worse this after noon. Patient with wheezing and labored respirations on arrival. Improved with steroids and albuterol treatment. Tm in ED noted to be 99.5. No sick contacts.  Due to patients prior history of PE, a CTA chest was obtained, which was negative for PE; however, did demonstrate ground glass opacities bilaterally suggestive of an atypical PNA.  Assessment & Plan   Patient admitted earlier today by Dr. Lyda Perone. See full H&P for details. Agree with assessment and plan.  Sepsis secondary to atypical pneumonia and influenza -Upon admission, patient was tachycardic, tachypneic, with leukocytosis -Upon admission, d-dimer 0.56 -CTA chest showed no evidence of pulmonary embolism, bilateral patchy airspace ground glass densities concerning for atypical pneumonia -Influenza panel positive for implant a -Continue azithromycin, ceftriaxone, Tamiflu  Acute asthma exacerbation -Continue asthma protocol, prednisone, nebulizer treatments  Polysubstance abuse -Urine drug screen positive for marijuana -Patient counseled on drug cessation  History of PE -CTA chest negative for PE -Will obtain LE doppler to rule DVT as Ddimer was elevated  Code Status: Full  Family  Communication: partner at bedside  Disposition Plan: admitted, d/c to home when respiratory state improved  Time Spent in minutes   30 minutes  Procedures  none  Consults   None  DVT Prophylaxis  Lovenox   Kara Haynes D.O. on 10/26/2015 at 2:03 PM  Between 7am to 7pm - Pager - [phone]  After 7pm go to [email or web address] - password [email or web address]  And look for the night coverage person covering for me after hours  [facility] Office  [phone]

## 2015-10-26 NOTE — Progress Notes (Signed)
Pt SOB after getting back to bed from using bedside commode. RR 26, SpO2 93% on 2LNC. BBS clear, diminished with just a slight exp wheeze RLL. After completing scheduled neb tx, I asked pt if she thought the treatments were helping with her symptoms. Her response was, "not really, well sometimes". Pt stated she didn't feel like she was getting enough O2, flow increased to 3LPM, pt said that felt better. RN notified of change. RT will continue to monitor.

## 2015-10-26 NOTE — ED Notes (Signed)
Spoke to Dr. Julian Reil.  Sts patient is cleared of cardiac related chest pain.  Sts patient is still appropriate for telemetry.  Will speak to receiving RN.

## 2015-10-26 NOTE — H&P (Signed)
[facility] History and Physical  Kara Haynes UJW:[id number] DOB: August 25, 1991 DOA: 10/25/2015  Referring physician: EDP PCP: No primary care provider on file.   Chief Complaint: SOB   HPI: Kara Haynes is a 25 y.o. female with h/o asthma, ongoing tobacco abuse.  Patient presents to the ED with acute asthma exacerbation.  States she was seen this morning here for same.  Albuterol and steroids given but symptoms got worse this after noon.  Patient with wheezing and labored respirations on arrival.  Improved with steroids and albuterol treatment.  Tm in ED noted to be 99.5.  No sick contacts.  Due to patients prior history of PE, a CTA chest was obtained, which was negative for PE; however, did demonstrate ground glass opacities bilaterally suggestive of an atypical PNA.  Review of Systems: Systems reviewed.  As above, otherwise negative  Past Medical History  Diagnosis Date  . Asthma   . Tobacco abuse   . Seasonal allergies   . UTI (lower urinary tract infection)   . Obesity   . PE (pulmonary embolism)    History reviewed. No pertinent past surgical history. Social History:  reports that she has been smoking Cigarettes.  She has been smoking about 0.50 packs per day. She does not have any smokeless tobacco history on file. She reports that she drinks alcohol. She reports that she uses illicit drugs (Marijuana).  No Known Allergies  Family History  Problem Relation Age of Onset  . Other      denies family h/o clotting d/o     Prior to Admission medications   Medication Sig Start Date End Date Taking? Authorizing Provider  predniSONE (DELTASONE) 20 MG tablet Take 3 tablets (60 mg total) by mouth daily. 10/25/15  Yes Tomasita Crumble, MD  PROAIR HFA 108 (90 BASE) MCG/ACT inhaler INHALE 1-2 PUFFS INTO THE LUNGS EVERY 6 HOURS AS NEEDED. 08/27/15  Yes Ejiroghene Wendall Stade, MD   Physical Exam: Filed Vitals:   10/25/15 2315 10/26/15 0033  BP: 106/62 132/59   Pulse: 113 119  Temp:    Resp: 37 30    BP 132/59 mmHg  Pulse 119  Temp(Src) 99.5 F (37.5 C) (Oral)  Resp 30  Ht 5\' 2"  (1.575 m)  Wt 99.791 kg (220 lb)  BMI 40.23 kg/m2  SpO2 98%  LMP 10/25/2015  General Appearance:    Alert, oriented, no distress, appears stated age  Head:    Normocephalic, atraumatic  Eyes:    PERRL, EOMI, sclera non-icteric        Nose:   Nares without drainage or epistaxis. Mucosa, turbinates normal  Throat:   Moist mucous membranes. Oropharynx without erythema or exudate.  Neck:   Supple. No carotid bruits.  No thyromegaly.  No lymphadenopathy.   Back:     No CVA tenderness, no spinal tenderness  Lungs:     Clear to auscultation bilaterally, without wheezes, rhonchi or rales  Chest wall:    No tenderness to palpitation  Heart:    Regular rate and rhythm without murmurs, gallops, rubs  Abdomen:     Soft, non-tender, nondistended, normal bowel sounds, no organomegaly  Genitalia:    deferred  Rectal:    deferred  Extremities:   No clubbing, cyanosis or edema.  Pulses:   2+ and symmetric all extremities  Skin:   Skin color, texture, turgor normal, no rashes or lesions  Lymph nodes:   Cervical, supraclavicular, and axillary nodes normal  Neurologic:   CNII-XII intact.  Normal strength, sensation and reflexes      throughout    Labs on Admission:  Basic Metabolic Panel:  Recent Labs Lab 10/25/15 2131  NA 136  K 4.1  CL 104  CO2 19*  GLUCOSE 127*  BUN 6  CREATININE 0.88  CALCIUM 9.3   Liver Function Tests: No results for input(s): AST, ALT, ALKPHOS, BILITOT, PROT, ALBUMIN in the last 168 hours. No results for input(s): LIPASE, AMYLASE in the last 168 hours. No results for input(s): AMMONIA in the last 168 hours. CBC:  Recent Labs Lab 10/25/15 2131  WBC 10.8*  HGB 13.2  HCT 38.4  MCV 84.2  PLT 277   Cardiac Enzymes: No results for input(s): CKTOTAL, CKMB, CKMBINDEX, TROPONINI in the last 168 hours.  BNP (last 3 results) No  results for input(s): PROBNP in the last 8760 hours. CBG: No results for input(s): GLUCAP in the last 168 hours.  Radiological Exams on Admission: Dg Chest 2 View  10/25/2015  CLINICAL DATA:  Acute onset of sharp left-sided chest pain. Shortness of breath. Initial encounter. EXAM: CHEST  2 VIEW COMPARISON:  Chest radiograph performed 10/25/2015 FINDINGS: The lungs are well-aerated and clear. There is no evidence of focal opacification, pleural effusion or pneumothorax. The heart is normal in size; the mediastinal contour is within normal limits. No acute osseous abnormalities are seen. IMPRESSION: No acute cardiopulmonary process seen. Electronically Signed   By: Roanna Raider M.D.   On: 10/25/2015 22:04   Dg Chest 2 View  10/25/2015  CLINICAL DATA:  25 year old female with shortness of breath EXAM: CHEST  2 VIEW COMPARISON:  Chest CT dated 07/10/2015 FINDINGS: The heart size and mediastinal contours are within normal limits. Both lungs are clear. The visualized skeletal structures are unremarkable. IMPRESSION: No active cardiopulmonary disease. Electronically Signed   By: Elgie Collard M.D.   On: 10/25/2015 02:39   Ct Angio Chest Pe W/cm &/or Wo Cm  10/26/2015  CLINICAL DATA:  25 year old female with shortness of breath and elevated D-dimer EXAM: CT ANGIOGRAPHY CHEST WITH CONTRAST TECHNIQUE: Multidetector CT imaging of the chest was performed using the standard protocol during bolus administration of intravenous contrast. Multiplanar CT image reconstructions and MIPs were obtained to evaluate the vascular anatomy. CONTRAST:  80mL OMNIPAQUE IOHEXOL 350 MG/ML SOLN COMPARISON:  Chest radiograph dated 10/25/2015 FINDINGS: There are scattered patchy areas of ground-glass opacity predominantly involving the subpleural lung. Right upper lobe scattered ground-glass density as well as patchy area of hazy density at the left lung base noted. Findings likely represent an atypical pneumonia or related to other  infectious/inflammatory processes. There is no pleural effusion or pneumothorax. The central airways are patent. The thoracic aorta appears unremarkable. No CT evidence of pulmonary embolism. There is right hilar adenopathy. No cardiomegaly or pericardial effusion. Esophagus is grossly unremarkable. No thyroid nodule identified. There is no axillary adenopathy. The chest wall soft tissues appear unremarkable with the osseous structures are intact. The visualized upper abdomen is grossly unremarkable. Review of the MIP images confirms the above findings. IMPRESSION: No CT evidence of pulmonary embolism. Bilateral patchy airspace ground-glass densities concerning for an atypical pneumonia. Other inflammatory processes are not excluded. Correlation with clinical exam, and pulmonary consult is advised. Electronically Signed   By: Elgie Collard M.D.   On: 10/26/2015 00:39    EKG: Independently reviewed.  Assessment/Plan Principal Problem:   CAP (community acquired pneumonia) Active Problems:   Asthma exacerbation   History of pulmonary embolism   1. CAP -  1. PNA pathway 2. Cultures pending 3. Influenza pnl pending 4. Rocephin and azithromycin 5. Tele monitor and pulse ox 2. Asthma exacerbation 1. Adult wheeze protocol 2. Prednisone 3. H/o PE - 1. No evidence of PE today on CTA 2. DVT ppx with lovenox    Code Status: Full  Family Communication: Family at bedside Disposition Plan: Admit to inpatient   Time spent: 70 min  GARDNER, JARED M. [facility] Pager [phone]  If 7AM-7PM, please contact the day team taking care of the patient [email or web address] Password [email or web address] 10/26/2015, 2:06 AM

## 2015-10-27 ENCOUNTER — Inpatient Hospital Stay (HOSPITAL_COMMUNITY): Payer: MEDICAID

## 2015-10-27 DIAGNOSIS — A419 Sepsis, unspecified organism: Secondary | ICD-10-CM | POA: Insufficient documentation

## 2015-10-27 DIAGNOSIS — R791 Abnormal coagulation profile: Secondary | ICD-10-CM

## 2015-10-27 LAB — BASIC METABOLIC PANEL
Anion gap: 10 (ref 5–15)
BUN: 12 mg/dL (ref 6–20)
CHLORIDE: 104 mmol/L (ref 101–111)
CO2: 24 mmol/L (ref 22–32)
CREATININE: 0.97 mg/dL (ref 0.44–1.00)
Calcium: 9 mg/dL (ref 8.9–10.3)
GFR calc Af Amer: >60 mL/min (ref >60)
GFR calc non Af Amer: >60 mL/min (ref >60)
Glucose, Bld: 111 mg/dL — ABNORMAL HIGH (ref 65–99)
Potassium: 4 mmol/L (ref 3.5–5.1)
SODIUM: 138 mmol/L (ref 135–145)

## 2015-10-27 LAB — CBC
HCT: 36.6 % (ref 36.0–46.0)
HEMOGLOBIN: 12 g/dL (ref 12.0–15.0)
MCH: 27.8 pg (ref 26.0–34.0)
MCHC: 32.8 g/dL (ref 30.0–36.0)
MCV: 84.7 fL (ref 78.0–100.0)
Platelets: 292 10*3/uL (ref 150–400)
RBC: 4.32 MIL/uL (ref 3.87–5.11)
RDW: 14.4 % (ref 11.5–15.5)
WBC: 9.1 10*3/uL (ref 4.0–10.5)

## 2015-10-27 LAB — HCV COMMENT:

## 2015-10-27 LAB — HEPATITIS C ANTIBODY (REFLEX)

## 2015-10-27 MED ORDER — CEFUROXIME AXETIL 500 MG PO TABS: 500.0000 mg | ORAL_TABLET | Freq: Two times a day (BID) | ORAL | Status: DC

## 2015-10-27 MED ORDER — AZITHROMYCIN 500 MG PO TABS: 500.0000 mg | ORAL_TABLET | Freq: Every day | ORAL | Status: DC

## 2015-10-27 MED ORDER — IBUPROFEN 600 MG PO TABS
600.0000 mg | ORAL_TABLET | Freq: Once | ORAL | Status: AC
  Administered 2015-10-27: 600 mg via ORAL
  Filled 2015-10-27: qty 1

## 2015-10-27 MED ORDER — OSELTAMIVIR PHOSPHATE 75 MG PO CAPS: 75.0000 mg | ORAL_CAPSULE | Freq: Two times a day (BID) | ORAL | Status: DC

## 2015-10-27 NOTE — Discharge Instructions (Signed)
Community-Acquired Pneumonia, Adult °Pneumonia is an infection of the lungs. There are different types of pneumonia. One type can develop while a person is in a hospital. A different type, called community-acquired pneumonia, develops in people who are not, or have not recently been, in the hospital or other health care facility.  °CAUSES °Pneumonia may be caused by bacteria, viruses, or funguses. Community-acquired pneumonia is often caused by Streptococcus pneumonia bacteria. These bacteria are often passed from one person to another by breathing in droplets from the cough or sneeze of an infected person. °RISK FACTORS °The condition is more likely to develop in: °· People who have chronic diseases, such as chronic obstructive pulmonary disease (COPD), asthma, congestive heart failure, cystic fibrosis, diabetes, or kidney disease. °· People who have early-stage or late-stage HIV. °· People who have sickle cell disease. °· People who have had their spleen removed (splenectomy). °· People who have poor dental hygiene. °· People who have medical conditions that increase the risk of breathing in (aspirating) secretions their own mouth and nose.   °· People who have a weakened immune system (immunocompromised). °· People who smoke. °· People who travel to areas where pneumonia-causing germs commonly exist. °· People who are around animal habitats or animals that have pneumonia-causing germs, including birds, bats, rabbits, cats, and farm animals. °SYMPTOMS °Symptoms of this condition include: °· A dry cough. °· A wet (productive) cough. °· Fever. °· Sweating. °· Chest pain, especially when breathing deeply or coughing. °· Rapid breathing or difficulty breathing. °· Shortness of breath. °· Shaking chills. °· Fatigue. °· Muscle aches. °DIAGNOSIS °Your health care provider will take a medical history and perform a physical exam. You may also have other tests, including: °· Imaging studies of your chest, including  X-rays. °· Tests to check your blood oxygen level and other blood gases. °· Other tests on blood, mucus (sputum), fluid around your lungs (pleural fluid), and urine. °If your pneumonia is severe, other tests may be done to identify the specific cause of your illness. °TREATMENT °The type of treatment that you receive depends on many factors, such as the cause of your pneumonia, the medicines you take, and other medical conditions that you have. For most adults, treatment and recovery from pneumonia may occur at home. In some cases, treatment must happen in a hospital. Treatment may include: °· Antibiotic medicines, if the pneumonia was caused by bacteria. °· Antiviral medicines, if the pneumonia was caused by a virus. °· Medicines that are given by mouth or through an IV tube. °· Oxygen. °· Respiratory therapy. °Although rare, treating severe pneumonia may include: °· Mechanical ventilation. This is done if you are not breathing well on your own and you cannot maintain a safe blood oxygen level. °· Thoracentesis. This procedure removes fluid around one lung or both lungs to help you breathe better. °HOME CARE INSTRUCTIONS °· Take over-the-counter and prescription medicines only as told by your health care provider. °¨ Only take cough medicine if you are losing sleep. Understand that cough medicine can prevent your body's natural ability to remove mucus from your lungs. °¨ If you were prescribed an antibiotic medicine, take it as told by your health care provider. Do not stop taking the antibiotic even if you start to feel better. °· Sleep in a semi-upright position at night. Try sleeping in a reclining chair, or place a few pillows under your head. °· Do not use tobacco products, including cigarettes, chewing tobacco, and e-cigarettes. If you need help quitting, ask your health care provider. °· Drink enough water to keep your urine   clear or pale yellow. This will help to thin out mucus secretions in your  lungs. °PREVENTION °There are ways that you can decrease your risk of developing community-acquired pneumonia. Consider getting a pneumococcal vaccine if: °· You are older than 26 years of age. °· You are older than 26 years of age and are undergoing cancer treatment, have chronic lung disease, or have other medical conditions that affect your immune system. Ask your health care provider if this applies to you. °There are different types and schedules of pneumococcal vaccines. Ask your health care provider which vaccination option is best for you. °You may also prevent community-acquired pneumonia if you take these actions: °· Get an influenza vaccine every year. Ask your health care provider which type of influenza vaccine is best for you. °· Go to the dentist on a regular basis. °· Wash your hands often. Use hand sanitizer if soap and water are not available. °SEEK MEDICAL CARE IF: °· You have a fever. °· You are losing sleep because you cannot control your cough with cough medicine. °SEEK IMMEDIATE MEDICAL CARE IF: °· You have worsening shortness of breath. °· You have increased chest pain. °· Your sickness becomes worse, especially if you are an older adult or have a weakened immune system. °· You cough up blood. °  °This information is not intended to replace advice given to you by your health care provider. Make sure you discuss any questions you have with your health care provider. °  °Document Released: 09/01/2005 Document Revised: 05/23/2015 Document Reviewed: 12/27/2014 °Elsevier Interactive Patient Education ©2016 [employer] ° °Influenza, Adult °Influenza ("the flu") is a viral infection of the respiratory tract. It occurs more often in winter months because people spend more time in close contact with one another. Influenza can make you feel very sick. Influenza easily spreads from person to person (contagious). °CAUSES  °Influenza is caused by a virus that infects the respiratory tract. You can catch  the virus by breathing in droplets from an infected person's cough or sneeze. You can also catch the virus by touching something that was recently contaminated with the virus and then touching your mouth, nose, or eyes. °RISKS AND COMPLICATIONS °You may be at risk for a more severe case of influenza if you smoke cigarettes, have diabetes, have chronic heart disease (such as heart failure) or lung disease (such as asthma), or if you have a weakened immune system. Elderly people and pregnant women are also at risk for more serious infections. The most common problem of influenza is a lung infection (pneumonia). Sometimes, this problem can require emergency medical care and may be life threatening. °SIGNS AND SYMPTOMS  °Symptoms typically last 4 to 10 days and may include: °· Fever. °· Chills. °· Headache, body aches, and muscle aches. °· Sore throat. °· Chest discomfort and cough. °· Poor appetite. °· Weakness or feeling tired. °· Dizziness. °· Nausea or vomiting. °DIAGNOSIS  °Diagnosis of influenza is often made based on your history and a physical exam. A nose or throat swab test can be done to confirm the diagnosis. °TREATMENT  °In mild cases, influenza goes away on its own. Treatment is directed at relieving symptoms. For more severe cases, your health care provider may prescribe antiviral medicines to shorten the sickness. Antibiotic medicines are not effective because the infection is caused by a virus, not by bacteria. °HOME CARE INSTRUCTIONS °· Take medicines only as directed by your health care provider. °· Use a cool mist humidifier to   make breathing easier. °· Get plenty of rest until your temperature returns to normal. This usually takes 3 to 4 days. °· Drink enough fluid to keep your urine clear or pale yellow. °· Cover your mouth and nose when coughing or sneezing, and wash your hands well to prevent the virus from spreading. °· Stay home from work or school until the fever is gone for at least 1 full  day. °PREVENTION  °An annual influenza vaccination (flu shot) is the best way to avoid getting influenza. An annual flu shot is now routinely recommended for all adults in the [place] °SEEK MEDICAL CARE IF: °· You experience chest pain, your cough worsens, or you produce more mucus. °· You have nausea, vomiting, or diarrhea. °· Your fever returns or gets worse. °SEEK IMMEDIATE MEDICAL CARE IF: °· You have trouble breathing, you become short of breath, or your skin or nails become bluish. °· You have severe pain or stiffness in the neck. °· You develop a sudden headache, or pain in the face or ear. °· You have nausea or vomiting that you cannot control. °MAKE SURE YOU:  °· Understand these instructions. °· Will watch your condition. °· Will get help right away if you are not doing well or get worse. °  °This information is not intended to replace advice given to you by your health care provider. Make sure you discuss any questions you have with your health care provider. °  °Document Released: 08/29/2000 Document Revised: 09/22/2014 Document Reviewed: 12/01/2011 °Elsevier Interactive Patient Education ©2016 [employer] ° °

## 2015-10-27 NOTE — Progress Notes (Signed)
*  PRELIMINARY RESULTS* Vascular Ultrasound Lower extremity venous duplex has been completed.  Preliminary findings: No evidence of DVT or baker's cyst.   Farrel Demark, RDMS, RVT  10/27/2015, 11:05 AM

## 2015-10-27 NOTE — Care Management Note (Signed)
Case Management Note  Patient Details  Name: Kara Haynes MRN: [id number] Date of Birth: 11/24/1990  Subjective/Objective:                  Sepsis secondary to atypical pneumonia and influenza Action/Plan: Discharge planning Expected Discharge Date:  10/27/15               Expected Discharge Plan:  Home/Self Care  In-House Referral:     Discharge planning Services  CM Consult, Indigent Health Clinic, Medication Assistance  Post Acute Care Choice:    Choice offered to:     DME Arranged:    DME Agency:     HH Arranged:    HH Agency:     Status of Service:  Completed, signed off  Medicare Important Message Given:    Date Medicare IM Given:    Medicare IM give by:    Date Additional Medicare IM Given:    Additional Medicare Important Message give by:     If discussed at Long Length of Stay Meetings, dates discussed:    Additional Comments: CM met with pt in room and gave pt [facility] pamphlet and pt verbalized understanding she will go to clinic any weekday morning this week and ask for : AN APPT FOR A PCP; AN APPT WITH A NAVIGATOR TO SECURE INSURANCE; AN APPT FOR FOLLOW UP MEDICAL CARE. CM also gave pt coupons to help defray the cost of tamiflu and Zithromax.  Pt states she can afford drugs.  No there CM needs were communicated. Jeffries, Sarah Christine, RN 10/27/2015, 4:36 PM  

## 2015-10-27 NOTE — Discharge Summary (Signed)
Physician Discharge Summary  Litzy Dicker Wright-Madkins UEA:[id number] DOB: [birth year] DOA: 10/25/2015  PCP: No primary care provider on file.  Admit date: 10/25/2015 Discharge date: 10/27/2015  Time spent: 45 minutes  Recommendations for Outpatient Follow-up:  Patient will be discharged to home.  Patient will need to follow up with primary care provider within one week of discharge.  Patient should continue medications as prescribed.  Patient should follow a regular diet.   Discharge Diagnoses:  Sepsis secondary to atypical pneumonia and influenza Acute asthma exacerbation Polysubstance abuse History of PE  Discharge Condition: Stable  Diet recommendation: Regular  Filed Weights   10/25/15 2118 10/26/15 0430 10/27/15 0535  Weight: 99.791 kg (220 lb) 98.567 kg (217 lb 4.8 oz) 97.07 kg (214 lb)    History of present illness:  on 10/26/15 by Dr. Lyda Perone [name] Wright-Madkins is a 25 y.o. female with h/o asthma, ongoing tobacco abuse. Patient presents to the ED with acute asthma exacerbation. States she was seen this morning here for same. Albuterol and steroids given but symptoms got worse this after noon. Patient with wheezing and labored respirations on arrival. Improved with steroids and albuterol treatment. Tm in ED noted to be 99.5. No sick contacts.  Due to patients prior history of PE, a CTA chest was obtained, which was negative for PE; however, did demonstrate ground glass opacities bilaterally suggestive of an atypical PNA.  Hospital Course:  Sepsis secondary to atypical pneumonia and influenza -Upon admission, patient was tachycardic, tachypneic, with leukocytosis -Leukocytosis resolved, vitals stable (patient improved quicker than expected) -Upon admission, d-dimer 0.56 -CTA chest showed no evidence of pulmonary embolism, bilateral patchy airspace ground glass densities concerning for atypical pneumonia -Influenza panel positive for A -Strep pneumonia  urine antigen negative -Initially placed on azithromycin, ceftriaxone, Tamiflu -Continue azithromycin, ceftin, tamiflu  Acute asthma exacerbation -Continue asthma protocol, prednisone, nebulizer treatments  Polysubstance abuse -Urine drug screen positive for marijuana -Patient counseled on drug cessation  History of PE -CTA chest negative for PE -DDimer elevated -LE doppler negative for DVT  Procedures: LE doppler  Consultations: None  Discharge Exam: Filed Vitals:   10/26/15 1900 10/27/15 0535  BP: 126/75 129/74  Pulse: 74 80  Temp: 98.6 F (37 C) 98 F (36.7 C)  Resp: 16 17     General: Well developed, well nourished, NAD, appears stated age  HEENT: NCAT,  mucous membranes moist.  Cardiovascular: S1 S2 auscultated, No murmurs, RRR  Respiratory: Diminished breath sounds with some exp wheezing.  Abdomen: Soft, obese, nontender, nondistended, + bowel sounds  Extremities: warm dry without cyanosis clubbing or edema  Neuro: AAOx3,nonfocal  Psych: Normal affect and demeanor  Discharge Instructions     Medication List    TAKE these medications        azithromycin 500 MG tablet  Commonly known as:  ZITHROMAX  Take 1 tablet (500 mg total) by mouth daily.     cefUROXime 500 MG tablet  Commonly known as:  CEFTIN  Take 1 tablet (500 mg total) by mouth 2 (two) times daily with a meal.     oseltamivir 75 MG capsule  Commonly known as:  TAMIFLU  Take 1 capsule (75 mg total) by mouth 2 (two) times daily.     predniSONE 20 MG tablet  Commonly known as:  DELTASONE  Take 3 tablets (60 mg total) by mouth daily.     PROAIR HFA 108 (90 Base) MCG/ACT inhaler  Generic drug:  albuterol  INHALE 1-2 PUFFS INTO THE  LUNGS EVERY 6 HOURS AS NEEDED.       No Known Allergies     Follow-up Information    Follow up with [facility].   Contact information:   [address] [place] [address] [phone]        The results of significant diagnostics from this hospitalization (including imaging, microbiology, ancillary and laboratory) are listed below for reference.    Significant Diagnostic Studies: Dg Chest 2 View  10/25/2015  CLINICAL DATA:  Acute onset of sharp left-sided chest pain. Shortness of breath. Initial encounter. EXAM: CHEST  2 VIEW COMPARISON:  Chest radiograph performed 10/25/2015 FINDINGS: The lungs are well-aerated and clear. There is no evidence of focal opacification, pleural effusion or pneumothorax. The heart is normal in size; the mediastinal contour is within normal limits. No acute osseous abnormalities are seen. IMPRESSION: No acute cardiopulmonary process seen. Electronically Signed   By: Roanna Raider M.D.   On: 10/25/2015 22:04   Dg Chest 2 View  10/25/2015  CLINICAL DATA:  25 year old female with shortness of breath EXAM: CHEST  2 VIEW COMPARISON:  Chest CT dated 07/10/2015 FINDINGS: The heart size and mediastinal contours are within normal limits. Both lungs are clear. The visualized skeletal structures are unremarkable. IMPRESSION: No active cardiopulmonary disease. Electronically Signed   By: Elgie Collard M.D.   On: 10/25/2015 02:39   Ct Angio Chest Pe W/cm &/or Wo Cm  10/26/2015  CLINICAL DATA:  25 year old female with shortness of breath and elevated D-dimer EXAM: CT ANGIOGRAPHY CHEST WITH CONTRAST TECHNIQUE: Multidetector CT imaging of the chest was performed using the standard protocol during bolus administration of intravenous contrast. Multiplanar CT image reconstructions and MIPs were obtained to evaluate the vascular anatomy. CONTRAST:  80mL OMNIPAQUE IOHEXOL 350 MG/ML SOLN COMPARISON:  Chest radiograph dated 10/25/2015 FINDINGS: There are scattered patchy areas of ground-glass opacity predominantly involving the subpleural lung. Right upper lobe scattered ground-glass density as well as patchy area of hazy density at the left lung base noted. Findings likely  represent an atypical pneumonia or related to other infectious/inflammatory processes. There is no pleural effusion or pneumothorax. The central airways are patent. The thoracic aorta appears unremarkable. No CT evidence of pulmonary embolism. There is right hilar adenopathy. No cardiomegaly or pericardial effusion. Esophagus is grossly unremarkable. No thyroid nodule identified. There is no axillary adenopathy. The chest wall soft tissues appear unremarkable with the osseous structures are intact. The visualized upper abdomen is grossly unremarkable. Review of the MIP images confirms the above findings. IMPRESSION: No CT evidence of pulmonary embolism. Bilateral patchy airspace ground-glass densities concerning for an atypical pneumonia. Other inflammatory processes are not excluded. Correlation with clinical exam, and pulmonary consult is advised. Electronically Signed   By: Elgie Collard M.D.   On: 10/26/2015 00:39    Microbiology: No results found for this or any previous visit (from the past 240 hour(s)).   Labs: Basic Metabolic Panel:  Recent Labs Lab 10/25/15 2131 10/26/15 0157 10/27/15 0315  NA 136  --  138  K 4.1  --  4.0  CL 104  --  104  CO2 19*  --  24  GLUCOSE 127*  --  111*  BUN 6  --  12  CREATININE 0.88  --  0.97  CALCIUM 9.3  --  9.0  MG  --  2.4  --    Liver Function Tests:  Recent Labs Lab 10/26/15 0157  AST 22  ALT 13*  ALKPHOS 46  BILITOT 0.8  PROT 7.1  ALBUMIN 3.7   No results for input(s): LIPASE, AMYLASE in the last 168 hours. No results for input(s): AMMONIA in the last 168 hours. CBC:  Recent Labs Lab 10/25/15 2131 10/27/15 0315  WBC 10.8* 9.1  HGB 13.2 12.0  HCT 38.4 36.6  MCV 84.2 84.7  PLT 277 292   Cardiac Enzymes: No results for input(s): CKTOTAL, CKMB, CKMBINDEX, TROPONINI in the last 168 hours. BNP: BNP (last 3 results) No results for input(s): BNP in the last 8760 hours.  ProBNP (last 3 results) No results for input(s):  PROBNP in the last 8760 hours.  CBG: No results for input(s): GLUCAP in the last 168 hours.     SignedEdsel Petrin  [facility] 10/27/2015, 10:32 AM

## 2015-11-07 ENCOUNTER — Inpatient Hospital Stay: Payer: Self-pay | Admitting: Family Medicine

## 2016-01-15 ENCOUNTER — Emergency Department (HOSPITAL_COMMUNITY): Payer: 59

## 2016-01-15 ENCOUNTER — Observation Stay (HOSPITAL_COMMUNITY)
Admission: EM | Admit: 2016-01-15 | Discharge: 2016-01-16 | Disposition: A | Discharge status: Home / Self Care | Payer: 59 | Attending: Oncology | Admitting: Oncology

## 2016-01-15 ENCOUNTER — Encounter (HOSPITAL_COMMUNITY): Payer: Self-pay

## 2016-01-15 DIAGNOSIS — Z86711 Personal history of pulmonary embolism: Secondary | ICD-10-CM | POA: Diagnosis not present

## 2016-01-15 DIAGNOSIS — E872 Acidosis: Secondary | ICD-10-CM | POA: Diagnosis not present

## 2016-01-15 DIAGNOSIS — F17210 Nicotine dependence, cigarettes, uncomplicated: Secondary | ICD-10-CM | POA: Insufficient documentation

## 2016-01-15 DIAGNOSIS — E669 Obesity, unspecified: Secondary | ICD-10-CM | POA: Insufficient documentation

## 2016-01-15 DIAGNOSIS — N179 Acute kidney failure, unspecified: Secondary | ICD-10-CM | POA: Diagnosis not present

## 2016-01-15 DIAGNOSIS — F1290 Cannabis use, unspecified, uncomplicated: Secondary | ICD-10-CM

## 2016-01-15 DIAGNOSIS — Z87440 Personal history of urinary (tract) infections: Secondary | ICD-10-CM | POA: Diagnosis not present

## 2016-01-15 DIAGNOSIS — E8729 Other acidosis: Secondary | ICD-10-CM

## 2016-01-15 DIAGNOSIS — E876 Hypokalemia: Secondary | ICD-10-CM | POA: Diagnosis not present

## 2016-01-15 DIAGNOSIS — R0602 Shortness of breath: Secondary | ICD-10-CM

## 2016-01-15 DIAGNOSIS — J45901 Unspecified asthma with (acute) exacerbation: Principal | ICD-10-CM | POA: Insufficient documentation

## 2016-01-15 DIAGNOSIS — R739 Hyperglycemia, unspecified: Secondary | ICD-10-CM

## 2016-01-15 LAB — RAPID URINE DRUG SCREEN, HOSP PERFORMED
Amphetamines: NOT DETECTED
Barbiturates: NOT DETECTED
Benzodiazepines: NOT DETECTED
Cocaine: NOT DETECTED
Opiates: NOT DETECTED
Tetrahydrocannabinol: POSITIVE — AB

## 2016-01-15 LAB — I-STAT VENOUS BLOOD GAS, ED
ACID-BASE DEFICIT: 8 mmol/L — AB (ref 0.0–2.0)
Bicarbonate: 17.9 mEq/L — ABNORMAL LOW (ref 20.0–24.0)
O2 SAT: 86 %
TCO2: 19 mmol/L (ref 0–100)
pCO2, Ven: 36.7 mmHg — ABNORMAL LOW (ref 45.0–50.0)
pH, Ven: 7.297 (ref 7.250–7.300)
pO2, Ven: 56 mmHg — ABNORMAL HIGH (ref 31.0–45.0)

## 2016-01-15 LAB — CBC WITH DIFFERENTIAL/PLATELET
BASOS PCT: 0 %
Basophils Absolute: 0 10*3/uL (ref 0.0–0.1)
EOS ABS: 0 10*3/uL (ref 0.0–0.7)
EOS PCT: 0 %
HCT: 35.3 % — ABNORMAL LOW (ref 36.0–46.0)
HEMOGLOBIN: 11.4 g/dL — AB (ref 12.0–15.0)
Lymphocytes Relative: 5 %
Lymphs Abs: 0.5 10*3/uL — ABNORMAL LOW (ref 0.7–4.0)
MCH: 28.4 pg (ref 26.0–34.0)
MCHC: 32.3 g/dL (ref 30.0–36.0)
MCV: 87.8 fL (ref 78.0–100.0)
MONO ABS: 0.1 10*3/uL (ref 0.1–1.0)
MONOS PCT: 2 %
NEUTROS PCT: 93 %
Neutro Abs: 8.1 10*3/uL — ABNORMAL HIGH (ref 1.7–7.7)
PLATELETS: 297 10*3/uL (ref 150–400)
RBC: 4.02 MIL/uL (ref 3.87–5.11)
RDW: 14.5 % (ref 11.5–15.5)
WBC: 8.7 10*3/uL (ref 4.0–10.5)

## 2016-01-15 LAB — URINALYSIS, ROUTINE W REFLEX MICROSCOPIC
Bilirubin Urine: NEGATIVE
Glucose, UA: >1000 mg/dL — AB
Ketones, ur: NEGATIVE mg/dL
Leukocytes, UA: NEGATIVE
NITRITE: NEGATIVE
PROTEIN: NEGATIVE mg/dL
Specific Gravity, Urine: 1.028 (ref 1.005–1.030)
pH: 5.5 (ref 5.0–8.0)

## 2016-01-15 LAB — URINE MICROSCOPIC-ADD ON

## 2016-01-15 LAB — BASIC METABOLIC PANEL
ANION GAP: 18 — AB (ref 5–15)
ANION GAP: 12 (ref 5–15)
BUN: 5 mg/dL — AB (ref 6–20)
BUN: <5 mg/dL — ABNORMAL LOW (ref 6–20)
CALCIUM: 9.2 mg/dL (ref 8.9–10.3)
CO2: 15 mmol/L — ABNORMAL LOW (ref 22–32)
CO2: 17 mmol/L — ABNORMAL LOW (ref 22–32)
CREATININE: 1.01 mg/dL — AB (ref 0.44–1.00)
Calcium: 8.6 mg/dL — ABNORMAL LOW (ref 8.9–10.3)
Chloride: 106 mmol/L (ref 101–111)
Chloride: 112 mmol/L — ABNORMAL HIGH (ref 101–111)
Creatinine, Ser: 1.1 mg/dL — ABNORMAL HIGH (ref 0.44–1.00)
Glucose, Bld: 230 mg/dL — ABNORMAL HIGH (ref 65–99)
Glucose, Bld: 144 mg/dL — ABNORMAL HIGH (ref 65–99)
POTASSIUM: 3 mmol/L — AB (ref 3.5–5.1)
Potassium: 4.2 mmol/L (ref 3.5–5.1)
SODIUM: 139 mmol/L (ref 135–145)
Sodium: 141 mmol/L (ref 135–145)

## 2016-01-15 LAB — I-STAT CHEM 8, ED
BUN: 5 mg/dL — AB (ref 6–20)
CALCIUM ION: 1.16 mmol/L (ref 1.12–1.23)
Chloride: 105 mmol/L (ref 101–111)
Creatinine, Ser: 0.7 mg/dL (ref 0.44–1.00)
Glucose, Bld: 223 mg/dL — ABNORMAL HIGH (ref 65–99)
HCT: 42 % (ref 36.0–46.0)
Hemoglobin: 14.3 g/dL (ref 12.0–15.0)
Potassium: 2.6 mmol/L — CL (ref 3.5–5.1)
SODIUM: 139 mmol/L (ref 135–145)
TCO2: 19 mmol/L (ref 0–100)

## 2016-01-15 LAB — HEPATIC FUNCTION PANEL
ALT: 14 U/L (ref 14–54)
AST: 22 U/L (ref 15–41)
Albumin: 3.8 g/dL (ref 3.5–5.0)
Alkaline Phosphatase: 53 U/L (ref 38–126)
BILIRUBIN DIRECT: 0.1 mg/dL (ref 0.1–0.5)
BILIRUBIN INDIRECT: 0.5 mg/dL (ref 0.3–0.9)
BILIRUBIN TOTAL: 0.6 mg/dL (ref 0.3–1.2)
Total Protein: 6.9 g/dL (ref 6.5–8.1)

## 2016-01-15 LAB — LACTIC ACID, PLASMA
LACTIC ACID, VENOUS: 4.3 mmol/L — AB (ref 0.5–2.0)
LACTIC ACID, VENOUS: 2.3 mmol/L — AB (ref 0.5–2.0)

## 2016-01-15 LAB — I-STAT BETA HCG BLOOD, ED (MC, WL, AP ONLY)

## 2016-01-15 LAB — ACETAMINOPHEN LEVEL

## 2016-01-15 LAB — MRSA PCR SCREENING: MRSA BY PCR: NEGATIVE

## 2016-01-15 LAB — TROPONIN I

## 2016-01-15 LAB — SALICYLATE LEVEL

## 2016-01-15 MED ORDER — IPRATROPIUM-ALBUTEROL 0.5-2.5 (3) MG/3ML IN SOLN
3.0000 mL | RESPIRATORY_TRACT | Status: DC
  Administered 2016-01-15 – 2016-01-16 (×6): 3 mL via RESPIRATORY_TRACT
  Filled 2016-01-15 (×6): qty 3

## 2016-01-15 MED ORDER — SODIUM CHLORIDE 0.9% FLUSH: 3.0000 mL | INTRAVENOUS | Status: DC | PRN

## 2016-01-15 MED ORDER — ACETAMINOPHEN 325 MG PO TABS
650.0000 mg | ORAL_TABLET | Freq: Four times a day (QID) | ORAL | Status: DC | PRN
  Administered 2016-01-15: 650 mg via ORAL
  Filled 2016-01-15: qty 2

## 2016-01-15 MED ORDER — ACETAMINOPHEN 650 MG RE SUPP: 650.0000 mg | Freq: Four times a day (QID) | RECTAL | Status: DC | PRN

## 2016-01-15 MED ORDER — SODIUM CHLORIDE 0.9 % IV BOLUS (SEPSIS)
1000.0000 mL | Freq: Once | INTRAVENOUS | Status: AC
  Administered 2016-01-15: 1000 mL via INTRAVENOUS

## 2016-01-15 MED ORDER — SODIUM CHLORIDE 0.9% FLUSH
3.0000 mL | Freq: Two times a day (BID) | INTRAVENOUS | Status: DC
  Administered 2016-01-15 – 2016-01-16 (×3): 3 mL via INTRAVENOUS

## 2016-01-15 MED ORDER — ALBUTEROL (5 MG/ML) CONTINUOUS INHALATION SOLN
10.0000 mg/h | INHALATION_SOLUTION | RESPIRATORY_TRACT | Status: DC
  Administered 2016-01-15: 10 mg/h via RESPIRATORY_TRACT

## 2016-01-15 MED ORDER — IPRATROPIUM BROMIDE 0.02 % IN SOLN
0.5000 mg | Freq: Once | RESPIRATORY_TRACT | Status: AC
  Administered 2016-01-15: 0.5 mg via RESPIRATORY_TRACT
  Filled 2016-01-15: qty 2.5

## 2016-01-15 MED ORDER — METHYLPREDNISOLONE SODIUM SUCC 125 MG IJ SOLR
80.0000 mg | Freq: Every day | INTRAMUSCULAR | Status: DC
  Administered 2016-01-16: 80 mg via INTRAVENOUS
  Filled 2016-01-15: qty 2

## 2016-01-15 MED ORDER — ALBUTEROL (5 MG/ML) CONTINUOUS INHALATION SOLN
10.0000 mg/h | INHALATION_SOLUTION | RESPIRATORY_TRACT | Status: DC
  Administered 2016-01-15: 10 mg/h via RESPIRATORY_TRACT
  Filled 2016-01-15: qty 20

## 2016-01-15 MED ORDER — POTASSIUM CHLORIDE CRYS ER 20 MEQ PO TBCR
60.0000 meq | EXTENDED_RELEASE_TABLET | Freq: Once | ORAL | Status: AC
  Administered 2016-01-15: 60 meq via ORAL
  Filled 2016-01-15: qty 3

## 2016-01-15 MED ORDER — MAGNESIUM SULFATE 2 GM/50ML IV SOLN
2.0000 g | Freq: Once | INTRAVENOUS | Status: AC
  Administered 2016-01-15: 2 g via INTRAVENOUS
  Filled 2016-01-15: qty 50

## 2016-01-15 MED ORDER — ENOXAPARIN SODIUM 40 MG/0.4ML ~~LOC~~ SOLN
40.0000 mg | SUBCUTANEOUS | Status: DC
  Administered 2016-01-15: 40 mg via SUBCUTANEOUS
  Filled 2016-01-15: qty 0.4

## 2016-01-15 NOTE — ED Notes (Signed)
Pt still getting winded each time walking to bathroom

## 2016-01-15 NOTE — Progress Notes (Signed)
Per EMS, pt woke up to shortness of breath and chest tightness. Pt woke up and took inhaler without relief. EMS noted SOB, increase WOB, audible wheezing, and accessory muscle usage. On my assessment, pt was in mild respiratory distress, RR 29, no accessory muscle usage at this time. Upon  auscultation,  Noted coarse aeration and expiratory wheezing. Pt is stable at this time sats are maintain at 99-100%.

## 2016-01-15 NOTE — H&P (Signed)
Date: 01/15/2016               Patient Name:  Kara Haynes MRN: [id number]  DOB: 06/23/1991 Age / Sex: 25 y.o., female   PCP: No primary care provider on file.         Medical Service: Internal Medicine Teaching Service         Attending Physician: Dr. Levert Feinstein, MD    First Contact: Dr. Deneise Lever Pager: [phone]  Second Contact: Dr. Griffin Basil Pager: [phone]       After Hours (After 5p/  First Contact Pager: [phone]  weekends / holidays): Second Contact Pager: [phone]   Chief Complaint: shortness of breath  History of Present Illness: Kara Haynes is 25 year old woman with PMH of asthma and PE here with increasing dyspnea, right sided chest pain and wheezing for the past day.  She reports increased productive cough, rhinorrhea, facial discomfort in past two days that she attributed to her allergies.  She is unaware of any particular asthma triggers.  She owns several pets and got a new dog a few months ago.  She quit smoking after last admission (two months ago) but is still around second-smoke.  She has never been on asthma maintenance medication.  She usually uses her rescue inhaler about 3 times per week but has increased to multiple times per day in past 2 days.  She awoke with right sided, sharp, pleuritic chest pain this AM that improved after she received a nebulizer treatment given by EMS.    Prior PE was over 1 year ago, unknown etiology, no family hx of VTE.  She was prescribed Xarelto for 6 months and completed this treatment in May 2016.  She denies recent leg pain/swelling, immobility, long car/plane rides or surgery.  She does not take birth control.  She denies use of any other medications besides prn albuterol, prn naproxen and prn Tylenol.     Meds: Current Facility-Administered Medications  Medication Dose Route Frequency Provider Last Rate Last Dose  . albuterol (PROVENTIL,VENTOLIN) solution continuous neb  10 mg/hr Nebulization  Continuous Laurence Spates, MD   Stopped at 01/15/16 0700  . albuterol (PROVENTIL,VENTOLIN) solution continuous neb  10 mg/hr Nebulization Continuous Laurence Spates, MD   Stopped at 01/15/16 0700   Current Outpatient Prescriptions  Medication Sig Dispense Refill  . PROAIR HFA 108 (90 BASE) MCG/ACT inhaler INHALE 1-2 PUFFS INTO THE LUNGS EVERY 6 HOURS AS NEEDED. (Patient taking differently: INHALE 1-2 PUFFS INTO THE LUNGS EVERY 6 HOURS AS NEEDED FOR SHORTNES OF BREATH.) 8.5 Inhaler 2  . [DISCONTINUED] rivaroxaban (XARELTO) 20 MG TABS tablet Take 1 tablet (20 mg total) by mouth daily with supper. Start after you complete the 15mg  tablets. (Patient not taking: Reported on 07/09/2015) 30 tablet 4    Allergies: Allergies as of 01/15/2016  . (No Known Allergies)   Past Medical History  Diagnosis Date  . Asthma   . Tobacco abuse   . Seasonal allergies   . UTI (lower urinary tract infection)   . Obesity   . PE (pulmonary embolism)    History reviewed. No pertinent past surgical history. Family History  Problem Relation Age of Onset  . Other      denies family h/o clotting d/o  . Asthma Maternal Grandfather   . Hypertension Maternal Grandfather    Social History   Social History  . Marital Status: Married    Spouse Name: N/A  . Number of  Children: N/A  . Years of Education: N/A   Occupational History  . Not on file.   Social History Main Topics  . Smoking status: Current Some Day Smoker -- 0.50 packs/day    Types: Cigarettes  . Smokeless tobacco: Not on file     Comment: smoking less 3 cigarettes aday  . Alcohol Use: 0.0 oz/week    0 Standard drinks or equivalent per week     Comment: occ  . Drug Use: Yes    Special: Marijuana  . Sexual Activity: Yes    Birth Control/ Protection: None   Other Topics Concern  . Not on file   Social History Narrative    Review of Systems: Pertinent items noted in HPI and remainder of comprehensive ROS otherwise  negative.  Physical Exam: Blood pressure 116/52, pulse 122, temperature 98.9 F (37.2 C), temperature source Oral, resp. rate 25, last menstrual period 01/11/2016, SpO2 96 %. General: sitting up in bed, able to provide history without problem HEENT: PERRL, EOMI, no scleral icterus, OP clear and moist Cardiac: sinus tachycardia, no rubs, murmurs or gallops Pulm: increased work of breathing after ambulating from bathroom, initially speaking in short sentences but improved with rest, moderate diffuse wheezes on exam, moving normal volumes of air Abd: soft, nontender, nondistended, BS present Ext: warm and well perfused, no pedal edema Neuro: alert and oriented X3, cranial nerves II-XII grossly intact, MAE x 4, 5/5 MMS and sensation grossly normal   Lab results: Basic Metabolic Panel:  Recent Labs  21/30/8603/11/02 0656 01/15/16 0657  NA 139 139  K 2.6* 3.0*  CL 105 106  CO2  --  15*  GLUCOSE 223* 230*  BUN 5* 5*  CREATININE 0.70 1.10*  CALCIUM  --  8.6*   CBC:  Recent Labs  01/15/16 0656 01/15/16 0657  WBC  --  8.7  NEUTROABS  --  8.1*  HGB 14.3 11.4*  HCT 42.0 35.3*  MCV  --  87.8  PLT  --  297   Urine Drug Screen: Drugs of Abuse     Component Value Date/Time   LABOPIA NONE DETECTED 01/15/2016 0837   COCAINSCRNUR NONE DETECTED 01/15/2016 0837   LABBENZ NONE DETECTED 01/15/2016 0837   AMPHETMU NONE DETECTED 01/15/2016 0837   THCU POSITIVE* 01/15/2016 0837   LABBARB NONE DETECTED 01/15/2016 0837    Urinalysis:  Recent Labs  01/15/16 0837  COLORURINE YELLOW  LABSPEC 1.028  PHURINE 5.5  GLUCOSEU >1000*  HGBUR TRACE*  BILIRUBINUR NEGATIVE  KETONESUR NEGATIVE  PROTEINUR NEGATIVE  NITRITE NEGATIVE  LEUKOCYTESUR NEGATIVE   Imaging results:  Dg Chest Port 1 View  01/15/2016  CLINICAL DATA:  Shortness of breath tonight.  History of asthma. EXAM: PORTABLE CHEST 1 VIEW COMPARISON:  10/25/2015 FINDINGS: The heart size and mediastinal contours are within normal limits.  Both lungs are clear. The visualized skeletal structures are unremarkable. IMPRESSION: No active disease. Electronically Signed   By: Burman NievesWilliam  Stevens M.D.   On: 01/15/2016 04:42    Other results: EKG: sinus tachycardia, 112 bpm, similar to prior  Assessment & Plan by Problem: 25 year old asthmatic here with dyspnea, wheeze and chest pain.  Asthma exacerbation:  Suspect asthma exacerbation given cough, allergy symptoms, wheezing.  She is stable, maintaining SpO2 on room air, no indication for intubation currently.  It is possible that trigger was preceding viral infection, untreated allergy, tobacco exposure and/or pet exposure.  No fever, leukocytosis, CXR findings to suggest PNA or flu.  She has hx  of PE but I think this is less likely given, lack of hypoxia, coupled with typical asthma symptoms of cough and wheeze.  Sinus tachycardia expected given recent continuous neb, steroid.  - admit to SDU  - close monitoring of respiratory status - duonebs q6h - continue solumedrol IV for now - consider CTA if no improvement with above measures - check peak flows - would recommend adding inhaled GC to her home regimen for maintenance med  AG metabolic acidosis:  AG 18, bicarb 15. No DM hx and CBG only mildly elevated at 230 without ketonuria to suggest DKA.  I suspect hyperglycemia is 2/2 to IV solumedrol she received.   - awaiting ABG - repeat BMP after fluids, check ASA, lactic acid  AKI:  Cr 1.10, baseline is 0.9.  Suspect pre-renal. - rehydrating - recheck BMP  Hypokalemia:  S/p albuterol and steroid. - replete - recheck BMP  Marijuana use - advise cessation  Diet:  Regular VTE ppx:  [place] lovenox Code status:  Full  Dispo: Disposition is deferred at this time, awaiting improvement of current medical problems. Anticipated discharge in approximately 1-2 day(s).   The patient does not have a current PCP (No primary care provider on file.) and may need an [facility] hospital follow-up  appointment after discharge.  The patient does not have transportation limitations that hinder transportation to clinic appointments.  Signed: Yolanda Manges, DO 01/15/2016, 9:41 AM

## 2016-01-15 NOTE — ED Provider Notes (Signed)
CSN: [id number]     Arrival date & time 01/15/16  0349 History   First MD Initiated Contact with Patient 01/15/16 0410     Chief Complaint  Patient presents with  . Shortness of Breath     (Consider location/radiation/quality/duration/timing/severity/associated sxs/prior Treatment) HPI Comments: 25 year old female with past medical history including asthma, seasonal allergies, PE not currently on anticoagulation who p/w shortness of breath. Patient states that she has been feeling short of breath and wheezy this year. She used a breathing treatment around 8:00 and woke up at 2:30 feeling short of breath and had tightness in her chest. She used her breathing treatment without relief and called EMS, who found the patient dyspneic. Gave duoneb and solumedrol en route. She does report associated runny nose and seasonal allergy symptoms, she is not currently on medication for her allergies. She denies any fevers, vomiting, or recent illness. She states that she has had this same chest tightness with her last asthma exacerbation.   Patient is a 25 y.o. female presenting with shortness of breath. The history is provided by the patient.  Shortness of Breath   Past Medical History  Diagnosis Date  . Asthma   . Tobacco abuse   . Seasonal allergies   . UTI (lower urinary tract infection)   . Obesity   . PE (pulmonary embolism)    History reviewed. No pertinent past surgical history. Family History  Problem Relation Age of Onset  . Other      denies family h/o clotting d/o   Social History  Substance Use Topics  . Smoking status: Current Some Day Smoker -- 0.50 packs/day    Types: Cigarettes  . Smokeless tobacco: None     Comment: smoking less 3 cigarettes aday  . Alcohol Use: 0.0 oz/week    0 Standard drinks or equivalent per week     Comment: occ   OB History    No data available     Review of Systems  Respiratory: Positive for shortness of breath.    10 Systems reviewed and are  negative for acute change except as noted in the HPI.    Allergies  Review of patient's allergies indicates no known allergies.  Home Medications   Prior to Admission medications   Medication Sig Start Date End Date Taking? Authorizing Provider  PROAIR HFA 108 (90 BASE) MCG/ACT inhaler INHALE 1-2 PUFFS INTO THE LUNGS EVERY 6 HOURS AS NEEDED. Patient taking differently: INHALE 1-2 PUFFS INTO THE LUNGS EVERY 6 HOURS AS NEEDED FOR SHORTNES OF BREATH. 08/27/15  Yes Ejiroghene E Emokpae, MD   BP 120/53 mmHg  Pulse 131  Temp(Src) 98.9 F (37.2 C) (Oral)  Resp 27  SpO2 97%  LMP 01/11/2016 Physical Exam  Constitutional: She is oriented to person, place, and time. She appears well-developed and well-nourished. No distress.  HENT:  Head: Normocephalic and atraumatic.  Moist mucous membranes  Eyes: Conjunctivae are normal. Pupils are equal, round, and reactive to light.  Neck: Neck supple.  Cardiovascular: Normal rate, regular rhythm and normal heart sounds.   No murmur heard. Pulmonary/Chest: Tachypnea noted. No respiratory distress. She has wheezes.  Diminished BS b/l w/ inspiratory and expiratory wheezes  Abdominal: Soft. Bowel sounds are normal. She exhibits no distension. There is no tenderness.  Musculoskeletal: She exhibits no edema.  Neurological: She is alert and oriented to person, place, and time.  Fluent speech  Skin: Skin is warm and dry.  Psychiatric: She has a normal mood and affect. Judgment  normal.  Nursing note and vitals reviewed.   ED Course  Procedures (including critical care time) Labs Review Labs Reviewed  CBC WITH DIFFERENTIAL/PLATELET - Abnormal; Notable for the following:    Hemoglobin 11.4 (*)    HCT 35.3 (*)    Neutro Abs 8.1 (*)    Lymphs Abs 0.5 (*)    All other components within normal limits  BASIC METABOLIC PANEL - Abnormal; Notable for the following:    Potassium 3.0 (*)    CO2 15 (*)    Glucose, Bld 230 (*)    BUN 5 (*)    Creatinine,  Ser 1.10 (*)    Calcium 8.6 (*)    Anion gap 18 (*)    All other components within normal limits  I-STAT VENOUS BLOOD GAS, ED - Abnormal; Notable for the following:    pCO2, Ven 36.7 (*)    pO2, Ven 56.0 (*)    Bicarbonate 17.9 (*)    Acid-base deficit 8.0 (*)    All other components within normal limits  I-STAT CHEM 8, ED - Abnormal; Notable for the following:    Potassium 2.6 (*)    BUN 5 (*)    Glucose, Bld 223 (*)    All other components within normal limits  URINALYSIS, ROUTINE W REFLEX MICROSCOPIC (NOT AT [facility])  I-STAT BETA HCG BLOOD, ED ([facility], [facility], [facility] ONLY)    Imaging Review Dg Chest Port 1 View  01/15/2016  CLINICAL DATA:  Shortness of breath tonight.  History of asthma. EXAM: PORTABLE CHEST 1 VIEW COMPARISON:  10/25/2015 FINDINGS: The heart size and mediastinal contours are within normal limits. Both lungs are clear. The visualized skeletal structures are unremarkable. IMPRESSION: No active disease. Electronically Signed   By: Burman Nieves M.D.   On: 01/15/2016 04:42   I have personally reviewed and evaluated these lab results as part of my medical decision-making.   EKG Interpretation   Date/Time:  Tuesday Jan 15 2016 04:07:19 EDT Ventricular Rate:  112 PR Interval:  110 QRS Duration: 89 QT Interval:  321 QTC Calculation: 438 R Axis:   39 Text Interpretation:  Sinus tachycardia RSR' in V1 or V2, probably normal  variant Borderline T wave abnormalities No significant change since last  tracing Confirmed by Lukasz Rogus MD, Marieli Rudy [phone]) on 01/15/2016 4:56:47 AM     Medications  albuterol (PROVENTIL,VENTOLIN) solution continuous neb (0 mg/hr Nebulization Stopped 01/15/16 0700)  albuterol (PROVENTIL,VENTOLIN) solution continuous neb (0 mg/hr Nebulization Stopped 01/15/16 0700)  sodium chloride 0.9 % bolus 1,000 mL (not administered)  magnesium sulfate IVPB 2 g 50 mL (0 g Intravenous Stopped 01/15/16 0617)  ipratropium (ATROVENT) nebulizer solution 0.5 mg (0.5 mg Nebulization  Given 01/15/16 0543)  potassium chloride SA (K-DUR,KLOR-CON) CR tablet 60 mEq (60 mEq Oral Given 01/15/16 0820)    MDM   Final diagnoses:  Asthma with exacerbation, unspecified asthma severity  Hypokalemia  Increased anion gap metabolic acidosis   PT w/ h/o asthma p/w SOB and wheezing tonight as well as chest tightness that she has had with previous asthma exacerbation. On arrival, the patient was placed on continuous albuterol for 1 hour. On my examination, she was mildly tachynpeic w/ diminished BS, inspiratory and expiratory wheezes but no respiratory distress. Obtained CXR which was unremarkable. EKG w/ sinus tachycardia similar to previous. Gave the patient magnesium in addition to continuous albuterol. After one hour of continuous, the patient remained dyspneic and wheezing; gave 2nd hour and added atrovent. Obtained above lab work which showed  venous pH 7.29 with CO2 36, potassium 3.0 which was obtained after continuous albuterol, normal WBC count, AG 18 w/ BG 230 and CO2 15. UA pending. Ordered 1L IVF, oral potassium repletion.  On reexamination after 2 hours of continuous albuterol, the patient's wheezing has improved but she remains dyspneic with significantly increased work of breathing with minimal movement including to the bathroom. I recommended admission based on her work of breathing and patient agreed. I have considered PE given the patient's history, however her examination with wheezing and diminished breath sounds is consistent with asthma and the patient states that her chest tightness is similar to her last episode of chest tightness associated with asthma exacerbation. Given the patient's young age and history of multiple CT scans, I deferred on CT scan at this time but I did discuss possibility of scan if no improvement with Dr. Andrey Campanile, internal medicine teaching service. patient admitted for further asthma treatment.   Laurence Spates, MD 01/15/16 [phone]

## 2016-01-15 NOTE — ED Notes (Signed)
Per [facility]: Pt initally started having short of breath around 7 pm, took breathing tx around 8 pm. Pt went to bed and woke up around 2:30 feeling short of breath and tight in her chest, she used her inhaler without relief, pt did not have anymore breathing txs so she did not take anymore. EMS arrived to find pt short of breat, using acessory muscles to breath, wheezing present in all fields. EMS gave 1 duoneb and 5 of albuterol initially, had some improvement with this but still had a lot of wheezing, pt then became short of breath again, breathing tx repeated at this time, 1 duoneb and 5 of albuterol. Pt also given 125 mg of solumedrol.

## 2016-01-15 NOTE — ED Notes (Signed)
Pt walked to and from restroom with this RN, bathroom was right next to pts room. Pt came out of bathroom taking deep breaths, pt walked to room and had to sit on the side of the bed for about 1 minute in tripod position before she was able to put her feet back in the bed. This RN asked the pt if she felt short of breath and the pt stated "no I feel fine", only able to speak 2 words at a time and visibly working to breathe, this RN asked the pt if she was want to and trying to go home and she said "yes" the pts girlfriend stated "she has to work and she can't call in so she is trying to go".

## 2016-01-15 NOTE — ED Notes (Signed)
Pt given ice water.

## 2016-01-15 NOTE — ED Notes (Signed)
Per EMS pt was acting better after her first breathing tx, pt seems worse after this second tx than she did after her first.

## 2016-01-15 NOTE — ED Notes (Signed)
Respiratory at bedside.

## 2016-01-15 NOTE — ED Notes (Signed)
Paged Cyndie ChimeGranfortuna MD to Tobi BastosAnna, RN.

## 2016-01-15 NOTE — ED Notes (Signed)
Paged Kyung RuddKennedy MD to Tobi BastosAnna, RN.

## 2016-01-15 NOTE — Progress Notes (Signed)
  Cat started  

## 2016-01-15 NOTE — Progress Notes (Signed)
CRITICAL VALUE ALERT  Critical value received:  Latic Acid 4.3   Date of notification:  01/15/16  Time of notification:  14:55  Critical value read back:Yes.    Nurse who received alert:  Reginold AgentWhitney Gabby Rackers, RN  MD notified (1st page):  DrMarland Kitchen.. Isabella BowensKrall  Time of first page:  15:00  MD notified (2nd page):  Time of second page:  Responding MD:    Time MD responded:  15:02

## 2016-01-15 NOTE — ED Notes (Signed)
Dr. Clarene DukeLittle aware of pts venous blood gas results.

## 2016-01-16 LAB — BASIC METABOLIC PANEL
Anion gap: 10 (ref 5–15)
CHLORIDE: 106 mmol/L (ref 101–111)
CO2: 21 mmol/L — ABNORMAL LOW (ref 22–32)
CREATININE: 0.89 mg/dL (ref 0.44–1.00)
Calcium: 8.9 mg/dL (ref 8.9–10.3)
Glucose, Bld: 111 mg/dL — ABNORMAL HIGH (ref 65–99)
Potassium: 4.2 mmol/L (ref 3.5–5.1)
SODIUM: 137 mmol/L (ref 135–145)

## 2016-01-16 MED ORDER — BECLOMETHASONE DIPROPIONATE 40 MCG/ACT IN AERS: 2.0000 | INHALATION_SPRAY | Freq: Every day | RESPIRATORY_TRACT | Status: DC

## 2016-01-16 MED ORDER — ALBUTEROL SULFATE (2.5 MG/3ML) 0.083% IN NEBU: 2.5000 mg | INHALATION_SOLUTION | RESPIRATORY_TRACT | Status: DC | PRN

## 2016-01-16 MED ORDER — IPRATROPIUM-ALBUTEROL 0.5-2.5 (3) MG/3ML IN SOLN: 3.0000 mL | Freq: Three times a day (TID) | RESPIRATORY_TRACT | Status: DC

## 2016-01-16 NOTE — Discharge Summary (Signed)
Name: Kara Haynes MRN: [id number] DOB: 1991/09/03 25 y.o. PCP: No primary care provider on file.  Date of Admission: 01/15/2016  3:49 AM Date of Discharge: 01/16/2016 Attending Physician: Levert Feinstein, MD  Discharge Diagnosis: 1. Exacerbation of moderate persistent asthma  Principal Problem:   Asthma exacerbation Active Problems:   Marijuana use   AKI (acute kidney injury) (HCC)  Discharge Medications:   Medication List    TAKE these medications        beclomethasone 40 MCG/ACT inhaler  Commonly known as:  QVAR  Inhale 2 puffs into the lungs daily.     PROAIR HFA 108 (90 Base) MCG/ACT inhaler  Generic drug:  albuterol  INHALE 1-2 PUFFS INTO THE LUNGS EVERY 6 HOURS AS NEEDED.        Disposition and follow-up:   Kara Haynes was discharged from [facility] in Good condition.  At the hospital follow up visit please address:  1.  Moderate persistent asthma- compliance and technique of using inhaler added Qvar on discharge- is she taking it? Marland Kitchen Also counseling on smoking cessation. Would benefit from peak flows.  Age appropriate preventive care and screening     2.  Labs / imaging needed at time of follow-up:   3.  Pending labs/ test needing follow-up:   Follow-up Appointments:     Follow-up Information    Follow up with Selina Cooley, MD On 01/21/2016.   Specialty:  Internal Medicine   Why:  2:15 PM for hospital follow up    Contact information:   [address] [place] [address] [phone]       Discharge Instructions: Discharge Instructions    Diet - low sodium heart healthy    Complete by:  As directed      Discharge instructions    Complete by:  As directed   Please use your albuterol as needed Please continue to stop smoking Please take the Qvar daily- it will help with the asthma   Please follow up in our clinic- and when you come- please talk with Ms. Doris about changing your PCP if  you desire to our clinic.     Increase activity slowly    Complete by:  As directed            Consultations:    Procedures Performed:  Dg Chest Port 1 View  01/15/2016  CLINICAL DATA:  Shortness of breath tonight.  History of asthma. EXAM: PORTABLE CHEST 1 VIEW COMPARISON:  10/25/2015 FINDINGS: The heart size and mediastinal contours are within normal limits. Both lungs are clear. The visualized skeletal structures are unremarkable. IMPRESSION: No active disease. Electronically Signed   By: Burman Nieves M.D.   On: 01/15/2016 04:42    2D Echo:   Cardiac Cath:   Admission HPI:   Ms. Kara Haynes is 25 year old woman with PMH of asthma and PE here with increasing dyspnea, right sided chest pain and wheezing for the past day. She reports increased productive cough, rhinorrhea, facial discomfort in past two days that she attributed to her allergies. She is unaware of any particular asthma triggers. She owns several pets and got a new dog a few months ago. She quit smoking after last admission (two months ago) but is still around second-smoke. She has never been on asthma maintenance medication. She usually uses her rescue inhaler about 3 times per week but has increased to multiple times per day in past 2 days. She awoke with  right sided, sharp, pleuritic chest pain this AM that improved after she received a nebulizer treatment given by EMS.   Prior PE was over 1 year ago, unknown etiology, no family hx of VTE. She was prescribed Xarelto for 6 months and completed this treatment in May 2016. She denies recent leg pain/swelling, immobility, long car/plane rides or surgery. She does not take birth control. She denies use of any other medications besides prn albuterol, prn naproxen and prn Tylenol.    Hospital Course by problem list: Principal Problem:   Asthma exacerbation Active Problems:   Marijuana use   AKI (acute kidney injury) (HCC)   1. Exacerbation of moderate  persistent asthma: Given patient's history of asthma, smoking history, cough, and wheezing, we believe this is an exacerbation of her asthma. She is not on a maintenance inhaler at home and only on albuterol. Likely triggers viral infection, smoking, or pet exposure. Her CXR is clear, she denies any pleuritic chest pain, and vital signs stable. She received IV solumedrol, and duonebs. On the morning of discharge, pt was feeling well and breathing had improved. We added Qvar to her regimen and made her a follow up appt. She is stable for discharge today   Anion gap metabolic acidosis- resolved, likely due to IV solumedrol.  bicarb has improved and WNL. Lactic acid has trended down.   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- s/p 6 months of [facility]  Discharge Vitals:   BP 118/69 mmHg  Pulse 99  Temp(Src) 98.4 F (36.9 C) (Oral)  Resp 24  Wt 213 lb 1.6 oz (96.662 kg)  SpO2 97%  LMP 01/11/2016  Discharge Labs:  Results for orders placed or performed during the hospital encounter of 01/15/16 (from the past 24 hour(s))  Lactic acid, plasma     Status: Abnormal   Collection Time: 01/15/16  1:56 PM  Result Value Ref Range   Lactic Acid, Venous 4.3 (HH) 0.5 - 2.0 mmol/L  Troponin I     Status: None   Collection Time: 01/15/16  1:56 PM  Result Value Ref Range   Troponin I <0.03 <0.031 ng/mL  Salicylate level     Status: None   Collection Time: 01/15/16  1:56 PM  Result Value Ref Range   Salicylate Lvl <4.0 2.8 - 30.0 mg/dL  Acetaminophen level     Status: Abnormal   Collection Time: 01/15/16  1:56 PM  Result Value Ref Range   Acetaminophen (Tylenol), Serum <10 (L) 10 - 30 ug/mL  Hepatic function panel     Status: None   Collection Time: 01/15/16  1:56 PM  Result Value Ref Range   Total  Protein 6.9 6.5 - 8.1 g/dL   Albumin 3.8 3.5 - 5.0 g/dL   AST 22 15 - 41 U/L   ALT 14 14 - 54 U/L   Alkaline Phosphatase 53 38 - 126 U/L   Total Bilirubin 0.6 0.3 - 1.2 mg/dL   Bilirubin, Direct 0.1 0.1 - 0.5 mg/dL   Indirect Bilirubin 0.5 0.3 - 0.9 mg/dL  Basic metabolic panel     Status: Abnormal   Collection Time: 01/15/16  1:56 PM  Result Value Ref Range   Sodium 141 135 - 145 mmol/L   Potassium 4.2 3.5 - 5.1 mmol/L   Chloride 112 (H) 101 -  111 mmol/L   CO2 17 (L) 22 - 32 mmol/L   Glucose, Bld 144 (H) 65 - 99 mg/dL   BUN <5 (L) 6 - 20 mg/dL   Creatinine, Ser 4.781.01 (H) 0.44 - 1.00 mg/dL   Calcium 9.2 8.9 - 29.510.3 mg/dL   GFR calc non Af Amer >60 >60 mL/min   GFR calc Af Amer >60 >60 mL/min   Anion gap 12 5 - 15  Lactic acid, plasma     Status: Abnormal   Collection Time: 01/15/16  3:07 PM  Result Value Ref Range   Lactic Acid, Venous 2.3 (HH) 0.5 - 2.0 mmol/L  MRSA PCR Screening     Status: None   Collection Time: 01/15/16  3:34 PM  Result Value Ref Range   MRSA by PCR NEGATIVE NEGATIVE  Basic metabolic panel     Status: Abnormal   Collection Time: 01/16/16  4:17 AM  Result Value Ref Range   Sodium 137 135 - 145 mmol/L   Potassium 4.2 3.5 - 5.1 mmol/L   Chloride 106 101 - 111 mmol/L   CO2 21 (L) 22 - 32 mmol/L   Glucose, Bld 111 (H) 65 - 99 mg/dL   BUN <5 (L) 6 - 20 mg/dL   Creatinine, Ser 6.210.89 0.44 - 1.00 mg/dL   Calcium 8.9 8.9 - 30.810.3 mg/dL   GFR calc non Af Amer >60 >60 mL/min   GFR calc Af Amer >60 >60 mL/min   Anion gap 10 5 - 15    Signed: Deneise LeverParth Raju Coppolino, MD 01/16/2016, 11:15 AM    Services Ordered on Discharge:  Equipment Ordered on Discharge:

## 2016-01-16 NOTE — Progress Notes (Signed)
Subjective:  Patient is doing well. No acute events overnight.  She says before she came here, she had some chest pain, but she denies any chest pain or shortness of breath today. She says her wheezing has improved. She was having yellow cough which had increased in frequency. She had quit smoking 2 months ago, but had recently smoked as she lost a loved one She does not have a PCP and has not seen one. She says she had to use her albuterol inhaler more frequently, and does not use any other inhalers.   She was comfortably eating breakfast and said she felt ready to go home- we will arrange hospital follow up in our clinic   Objective: Vital signs in last 24 hours: Filed Vitals:   01/16/16 0358 01/16/16 0435 01/16/16 0736 01/16/16 0737  BP:  113/60  118/69  Pulse:    99  Temp:  98.5 F (36.9 C)  98.4 F (36.9 C)  TempSrc:  Oral  Oral  Resp:    24  Weight:  213 lb 1.6 oz (96.662 kg)    SpO2: 100%  100% 97%   Weight change:   Intake/Output Summary (Last 24 hours) at 01/16/16 1054 Last data filed at 01/15/16 2200  Gross per 24 hour  Intake    240 ml  Output    800 ml  Net   -560 ml   General: Vital signs reviewed. Patient in no acute distress Cardiovascular: regular rate, rhythm, no murmur appreciated  Pulmonary/Chest: some wheezing heard b/l  Abdominal: Soft, non-tender, non-distended, BS + Extremities: No lower extremity edema bilaterally, pulses symmetric and intact bilaterally. Skin: Warm, dry and intact. No rashes or erythema.   Lab Results: Results for orders placed or performed during the hospital encounter of 01/15/16 (from the past 24 hour(s))  Lactic acid, plasma     Status: Abnormal   Collection Time: 01/15/16  1:56 PM  Result Value Ref Range   Lactic Acid, Venous 4.3 (HH) 0.5 - 2.0 mmol/L  Troponin I     Status: None   Collection Time: 01/15/16  1:56 PM  Result Value Ref Range   Troponin I <0.03 <0.031 ng/mL  Salicylate level     Status: None   Collection Time: 01/15/16  1:56 PM  Result Value Ref Range   Salicylate Lvl <4.0 2.8 - 30.0 mg/dL  Acetaminophen level     Status: Abnormal   Collection Time: 01/15/16  1:56 PM  Result Value Ref Range   Acetaminophen (Tylenol), Serum <10 (L) 10 - 30 ug/mL  Hepatic function panel     Status: None   Collection Time: 01/15/16  1:56 PM  Result Value Ref Range   Total Protein 6.9 6.5 - 8.1 g/dL   Albumin 3.8 3.5 - 5.0 g/dL   AST 22 15 - 41 U/L   ALT 14 14 - 54 U/L   Alkaline Phosphatase 53 38 - 126 U/L   Total Bilirubin 0.6 0.3 - 1.2 mg/dL   Bilirubin, Direct 0.1 0.1 - 0.5 mg/dL   Indirect Bilirubin 0.5 0.3 - 0.9 mg/dL  Basic metabolic panel     Status: Abnormal   Collection Time: 01/15/16  1:56 PM  Result Value Ref Range   Sodium 141 135 - 145 mmol/L   Potassium 4.2 3.5 - 5.1 mmol/L   Chloride 112 (H) 101 - 111 mmol/L   CO2 17 (L) 22 - 32 mmol/L   Glucose, Bld 144 (H) 65 - 99 mg/dL   BUN <  5 (L) 6 - 20 mg/dL   Creatinine, Ser 0.271.01 (H) 0.44 - 1.00 mg/dL   Calcium 9.2 8.9 - 25.310.3 mg/dL   GFR calc non Af Amer >60 >60 mL/min   GFR calc Af Amer >60 >60 mL/min   Anion gap 12 5 - 15  Lactic acid, plasma     Status: Abnormal   Collection Time: 01/15/16  3:07 PM  Result Value Ref Range   Lactic Acid, Venous 2.3 (HH) 0.5 - 2.0 mmol/L  MRSA PCR Screening     Status: None   Collection Time: 01/15/16  3:34 PM  Result Value Ref Range   MRSA by PCR NEGATIVE NEGATIVE  Basic metabolic panel     Status: Abnormal   Collection Time: 01/16/16  4:17 AM  Result Value Ref Range   Sodium 137 135 - 145 mmol/L   Potassium 4.2 3.5 - 5.1 mmol/L   Chloride 106 101 - 111 mmol/L   CO2 21 (L) 22 - 32 mmol/L   Glucose, Bld 111 (H) 65 - 99 mg/dL   BUN <5 (L) 6 - 20 mg/dL   Creatinine, Ser 6.640.89 0.44 - 1.00 mg/dL   Calcium 8.9 8.9 - 40.310.3 mg/dL   GFR calc non Af Amer >60 >60 mL/min   GFR calc Af Amer >60 >60 mL/min   Anion gap 10 5 - 15     Micro Results: Recent Results (from the past 240 hour(s))    MRSA PCR Screening     Status: None   Collection Time: 01/15/16  3:34 PM  Result Value Ref Range Status   MRSA by PCR NEGATIVE NEGATIVE Final    Comment:        The GeneXpert MRSA Assay (FDA approved for NASAL specimens only), is one component of a comprehensive MRSA colonization surveillance program. It is not intended to diagnose MRSA infection nor to guide or monitor treatment for MRSA infections.    Studies/Results: Dg Chest Port 1 View  01/15/2016  CLINICAL DATA:  Shortness of breath tonight.  History of asthma. EXAM: PORTABLE CHEST 1 VIEW COMPARISON:  10/25/2015 FINDINGS: The heart size and mediastinal contours are within normal limits. Both lungs are clear. The visualized skeletal structures are unremarkable. IMPRESSION: No active disease. Electronically Signed   By: Burman NievesWilliam  Stevens M.D.   On: 01/15/2016 04:42   Medications: I have reviewed the patient's current medications. Scheduled Meds: . enoxaparin (LOVENOX) injection  40 mg Subcutaneous Q24H  . ipratropium-albuterol  3 mL Nebulization TID  . methylPREDNISolone (SOLU-MEDROL) injection  80 mg Intravenous Daily  . sodium chloride flush  3 mL Intravenous Q12H   Continuous Infusions:  PRN Meds:.acetaminophen **OR** acetaminophen, albuterol, sodium chloride flush Assessment/Plan: Principal Problem:   Asthma exacerbation Active Problems:   Marijuana use   AKI (acute kidney injury) (HCC)  Exacerbation of moderate persistent asthma: Given patient's history of asthma, smoking history, cough, and wheezing, we believe this is an exacerbation of her asthma. She is not on a maintenance inhaler at home and only on albuterol. Likely triggers viral infection, smoking, or pet exposure. Her CXR is clear, she denies any pleuritic chest pain, and vital signs stable. She is stable for discharge today  -stop solumedrol- received dose today -continue albuterol Add inhaled glucocorticoid- Qvar on discharge and follow up in  clinic  Anion gap metabolic acidosis- resolved, bicarb has improved and WNL. Lactic acid has trended down.   AKI- Creatinine back to baseline around 0.9   Diet: Regular VTE ppx: Snyder lovenox  Code status: Full   Dispo: Disposition is deferred at this time, awaiting improvement of current medical problems.  Anticipated discharge in approximately 0 day(s).   The patient does not have a current PCP (No primary care provider on file.) and does need an [facility] hospital follow-up appointment after discharge.  The patient does not have transportation limitations that hinder transportation to clinic appointments.  .Services Needed at time of discharge: Y = Yes, Blank = No PT:   OT:   RN:   Equipment:   Other:       Deneise Lever, MD 01/16/2016, 10:54 AM

## 2016-01-16 NOTE — Discharge Instructions (Signed)
Please use your albuterol as needed Please continue to stop smoking Please take the Qvar daily- it will help with the asthma   Please follow up in our clinic- and when you come- please talk with Ms. Doris about changing your PCP if you desire to our clinic.   Asthma, Adult Asthma is a recurring condition in which the airways tighten and narrow. Asthma can make it difficult to breathe. It can cause coughing, wheezing, and shortness of breath. Asthma episodes, also called asthma attacks, range from minor to life-threatening. Asthma cannot be cured, but medicines and lifestyle changes can help control it. CAUSES Asthma is believed to be caused by inherited (genetic) and environmental factors, but its exact cause is unknown. Asthma may be triggered by allergens, lung infections, or irritants in the air. Asthma triggers are different for each person. Common triggers include:   Animal dander.  Dust mites.  Cockroaches.  Pollen from trees or grass.  Mold.  Smoke.  Air pollutants such as dust, household cleaners, hair sprays, aerosol sprays, paint fumes, strong chemicals, or strong odors.  Cold air, weather changes, and winds (which increase molds and pollens in the air).  Strong emotional expressions such as crying or laughing hard.  Stress.  Certain medicines (such as aspirin) or types of drugs (such as beta-blockers).  Sulfites in foods and drinks. Foods and drinks that may contain sulfites include dried fruit, potato chips, and sparkling grape juice.  Infections or inflammatory conditions such as the flu, a cold, or an inflammation of the nasal membranes (rhinitis).  Gastroesophageal reflux disease (GERD).  Exercise or strenuous activity. SYMPTOMS Symptoms may occur immediately after asthma is triggered or many hours later. Symptoms include:  Wheezing.  Excessive nighttime or early morning coughing.  Frequent or severe coughing with a common cold.  Chest  tightness.  Shortness of breath. DIAGNOSIS  The diagnosis of asthma is made by a review of your medical history and a physical exam. Tests may also be performed. These may include:  Lung function studies. These tests show how much air you breathe in and out.  Allergy tests.  Imaging tests such as X-rays. TREATMENT  Asthma cannot be cured, but it can usually be controlled. Treatment involves identifying and avoiding your asthma triggers. It also involves medicines. There are 2 classes of medicine used for asthma treatment:   Controller medicines. These prevent asthma symptoms from occurring. They are usually taken every day.  Reliever or rescue medicines. These quickly relieve asthma symptoms. They are used as needed and provide short-term relief. Your health care provider will help you create an asthma action plan. An asthma action plan is a written plan for managing and treating your asthma attacks. It includes a list of your asthma triggers and how they may be avoided. It also includes information on when medicines should be taken and when their dosage should be changed. An action plan may also involve the use of a device called a peak flow meter. A peak flow meter measures how well the lungs are working. It helps you monitor your condition. HOME CARE INSTRUCTIONS   Take medicines only as directed by your health care provider. Speak with your health care provider if you have questions about how or when to take the medicines.  Use a peak flow meter as directed by your health care provider. Record and keep track of readings.  Understand and use the action plan to help minimize or stop an asthma attack without needing to seek medical  care.  Control your home environment in the following ways to help prevent asthma attacks:  Do not smoke. Avoid being exposed to secondhand smoke.  Change your heating and air conditioning filter regularly.  Limit your use of fireplaces and wood  stoves.  Get rid of pests (such as roaches and mice) and their droppings.  Throw away plants if you see mold on them.  Clean your floors and dust regularly. Use unscented cleaning products.  Try to have someone else vacuum for you regularly. Stay out of rooms while they are being vacuumed and for a short while afterward. If you vacuum, use a dust mask from a hardware store, a double-layered or microfilter vacuum cleaner bag, or a vacuum cleaner with a HEPA filter.  Replace carpet with wood, tile, or vinyl flooring. Carpet can trap dander and dust.  Use allergy-proof pillows, mattress covers, and box spring covers.  Wash bed sheets and blankets every week in hot water and dry them in a dryer.  Use blankets that are made of polyester or cotton.  Clean bathrooms and kitchens with bleach. If possible, have someone repaint the walls in these rooms with mold-resistant paint. Keep out of the rooms that are being cleaned and painted.  Wash hands frequently. SEEK MEDICAL CARE IF:   You have wheezing, shortness of breath, or a cough even if taking medicine to prevent attacks.  The colored mucus you cough up (sputum) is thicker than usual.  Your sputum changes from clear or white to yellow, green, gray, or bloody.  You have any problems that may be related to the medicines you are taking (such as a rash, itching, swelling, or trouble breathing).  You are using a reliever medicine more than 2-3 times per week.  Your peak flow is still at 50-79% of your personal best after following your action plan for 1 hour.  You have a fever. SEEK IMMEDIATE MEDICAL CARE IF:   You seem to be getting worse and are unresponsive to treatment during an asthma attack.  You are short of breath even at rest.  You get short of breath when doing very little physical activity.  You have difficulty eating, drinking, or talking due to asthma symptoms.  You develop chest pain.  You develop a fast  heartbeat.  You have a bluish color to your lips or fingernails.  You are light-headed, dizzy, or faint.  Your peak flow is less than 50% of your personal best.   This information is not intended to replace advice given to you by your health care provider. Make sure you discuss any questions you have with your health care provider.   Document Released: 09/01/2005 Document Revised: 05/23/2015 Document Reviewed: 03/31/2013 Elsevier Interactive Patient Education 2016 [employer] Inc[employer]  Asthma Attack Prevention While you may not be able to control the fact that you have asthma, you can take actions to prevent asthma attacks. The best way to prevent asthma attacks is to maintain good control of your asthma. You can achieve this by:  Taking your medicines as directed.  Avoiding things that can irritate your airways or make your asthma symptoms worse (asthma triggers).  Keeping track of how well your asthma is controlled and of any changes in your symptoms.  Responding quickly to worsening asthma symptoms (asthma attack).  Seeking emergency care when it is needed. WHAT ARE SOME WAYS TO PREVENT AN ASTHMA ATTACK? Have a Plan Work with your health care provider to create a written plan for managing and  treating your asthma attacks (asthma action plan). This plan includes:  A list of your asthma triggers and how you can avoid them.  Information on when medicines should be taken and when their dosages should be changed.  The use of a device that measures how well your lungs are working (peak flow meter). Monitor Your Asthma Use your peak flow meter and record your results in a journal every day. A drop in your peak flow numbers on one or more days may indicate the start of an asthma attack. This can happen even before you start to feel symptoms. You can prevent an asthma attack from getting worse by following the steps in your asthma action plan. Avoid Asthma Triggers Work with your asthma  health care provider to find out what your asthma triggers are. This can be done by:  Allergy testing.  Keeping a journal that notes when asthma attacks occur and the factors that may have contributed to them.  Determining if there are other medical conditions that are making your asthma worse. Once you have determined your asthma triggers, take steps to avoid them. This may include avoiding excessive or prolonged exposure to:  Dust. Have someone dust and vacuum your home for you once or twice a week. Using a high-efficiency particulate arrestance (HEPA) vacuum is best.  Smoke. This includes campfire smoke, forest fire smoke, and secondhand smoke from tobacco products.  Pet dander. Avoid contact with animals that you know you are allergic to.  Allergens from trees, grasses or pollens. Avoid spending a lot of time outdoors when pollen counts are high, and on very windy days.  Very cold, dry, or humid air.  Mold.  Foods that contain high amounts of sulfites.  Strong odors.  Outdoor air pollutants, such as Museum/gallery exhibitions officer.  Indoor air pollutants, such as aerosol sprays and fumes from household cleaners.  Household pests, including dust mites and cockroaches, and pest droppings.  Certain medicines, including NSAIDs. Always talk to your health care provider before stopping or starting any new medicines. Medicines Take over-the-counter and prescription medicines only as told by your health care provider. Many asthma attacks can be prevented by carefully following your medicine schedule. Taking your medicines correctly is especially important when you cannot avoid certain asthma triggers. Act Quickly If an asthma attack does happen, acting quickly can decrease how severe it is and how long it lasts. Take these steps:   Pay attention to your symptoms. If you are coughing, wheezing, or having difficulty breathing, do not wait to see if your symptoms go away on their own. Follow your asthma  action plan.  If you have followed your asthma action plan and your symptoms are not improving, call your health care provider or seek immediate medical care at the nearest hospital. It is important to note how often you need to use your fast-acting rescue inhaler. If you are using your rescue inhaler more often, it may mean that your asthma is not under control. Adjusting your asthma treatment plan may help you to prevent future asthma attacks and help you to gain better control of your condition. HOW CAN I PREVENT AN ASTHMA ATTACK WHEN I EXERCISE? Follow advice from your health care provider about whether you should use your fast-acting inhaler before exercising. Many people with asthma experience exercise-induced bronchoconstriction (EIB). This condition often worsens during vigorous exercise in cold, humid, or dry environments. Usually, people with EIB can stay very active by pre-treating with a fast-acting inhaler before exercising.  This information is not intended to replace advice given to you by your health care provider. Make sure you discuss any questions you have with your health care provider.   Document Released: 08/20/2009 Document Revised: 05/23/2015 Document Reviewed: 02/01/2015 Elsevier Interactive Patient Education 2016 [employer]  Hypokalemia Hypokalemia means that the amount of potassium in the blood is lower than normal.Potassium is a chemical, called an electrolyte, that helps regulate the amount of fluid in the body. It also stimulates muscle contraction and helps nerves function properly.Most of the body's potassium is inside of cells, and only a very small amount is in the blood. Because the amount in the blood is so small, minor changes can be life-threatening. CAUSES  Antibiotics.  Diarrhea or vomiting.  Using laxatives too much, which can cause diarrhea.  Chronic kidney disease.  Water pills (diuretics).  Eating disorders (bulimia).  Low magnesium  level.  Sweating a lot. SIGNS AND SYMPTOMS  Weakness.  Constipation.  Fatigue.  Muscle cramps.  Mental confusion.  Skipped heartbeats or irregular heartbeat (palpitations).  Tingling or numbness. DIAGNOSIS  Your health care provider can diagnose hypokalemia with blood tests. In addition to checking your potassium level, your health care provider may also check other lab tests. TREATMENT Hypokalemia can be treated with potassium supplements taken by mouth or adjustments in your current medicines. If your potassium level is very low, you may need to get potassium through a vein (IV) and be monitored in the hospital. A diet high in potassium is also helpful. Foods high in potassium are:  Nuts, such as peanuts and pistachios.  Seeds, such as sunflower seeds and pumpkin seeds.  Peas, lentils, and lima beans.  Whole grain and bran cereals and breads.  Fresh fruit and vegetables, such as apricots, avocado, bananas, cantaloupe, kiwi, oranges, tomatoes, asparagus, and potatoes.  Orange and tomato juices.  Red meats.  Fruit yogurt. HOME CARE INSTRUCTIONS  Take all medicines as prescribed by your health care provider.  Maintain a healthy diet by including nutritious food, such as fruits, vegetables, nuts, whole grains, and lean meats.  If you are taking a laxative, be sure to follow the directions on the label. SEEK MEDICAL CARE IF:  Your weakness gets worse.  You feel your heart pounding or racing.  You are vomiting or having diarrhea.  You are diabetic and having trouble keeping your blood glucose in the normal range. SEEK IMMEDIATE MEDICAL CARE IF:  You have chest pain, shortness of breath, or dizziness.  You are vomiting or having diarrhea for more than 2 days.  You faint. MAKE SURE YOU:   Understand these instructions.  Will watch your condition.  Will get help right away if you are not doing well or get worse.   This information is not intended to  replace advice given to you by your health care provider. Make sure you discuss any questions you have with your health care provider.   Document Released: 09/01/2005 Document Revised: 09/22/2014 Document Reviewed: 03/04/2013 Elsevier Interactive Patient Education [employer] Inc.

## 2016-01-18 ENCOUNTER — Telehealth: Payer: Self-pay | Admitting: Internal Medicine

## 2016-01-18 NOTE — Telephone Encounter (Signed)
APT. REMINDER CALL, LMTCB °

## 2016-01-21 ENCOUNTER — Ambulatory Visit (INDEPENDENT_AMBULATORY_CARE_PROVIDER_SITE_OTHER): Payer: 59 | Admitting: Internal Medicine

## 2016-01-21 ENCOUNTER — Encounter: Payer: Self-pay | Admitting: Internal Medicine

## 2016-01-21 VITALS — BP 128/37 | HR 87 | Temp 98.3°F | Ht 62.0 in | Wt 219.5 lb

## 2016-01-21 DIAGNOSIS — J4530 Mild persistent asthma, uncomplicated: Secondary | ICD-10-CM | POA: Diagnosis not present

## 2016-01-21 NOTE — Progress Notes (Signed)
   Subjective:    Patient ID: Kara Haynes, female    DOB: [birth year], 26 y.o.   MRN: [id number]  HPI   Kara Haynes is a 25 year old with a PMH of PE in 2015, marijuana use, and mild persistent asthma who comes to the clinic for follow-up from a hospitalization for an asthma exacerbation. She reports approximately 1 exacerbation a year always in the spring and summertime. These are preceded by itchy eyes, runny nose, and sinus pain. She thinks this is triggered by pollen. She has been adherent with her Qvar since leaving the hospital. She only needs her albuterol inhaler 3-4 times a week. She continues to smoke two cigarettes a day, and she believes she is on the verge of quitting. She does not want nicotine replacement therapy.   Review of Systems  Constitutional: Negative for fever and chills.  HENT: Positive for rhinorrhea and sneezing.   Eyes: Positive for redness and itching.  Respiratory: Negative for cough and shortness of breath.   Neurological: Negative for dizziness and headaches.  Psychiatric/Behavioral: Negative for dysphoric mood. The patient is not nervous/anxious.        Objective:   Physical Exam  Constitutional: She appears well-developed. No distress.  Obese.  Cardiovascular: Normal rate, regular rhythm and normal heart sounds.   Pulmonary/Chest: Effort normal and breath sounds normal. No respiratory distress. She has no wheezes.  Skin: Skin is warm and dry.  Psychiatric: She has a normal mood and affect. Her behavior is normal.  Vitals reviewed.         Assessment & Plan:   Please see problem based assessment and plan for details.

## 2016-01-21 NOTE — Assessment & Plan Note (Signed)
A: Patient has been adherent with Qvar. Dyspnea 3-4 times a week requiring SABA. Symptoms are temporally related to allergies, which we will treat  P: Qvar Albuterol inhaler prn Loratadine

## 2016-01-21 NOTE — Patient Instructions (Signed)
Mr. Kara Haynes,  It was a JOY meeting you today.  For your asthma, please keep doing what you're doing. I'm going to add a daily medication called Loratadine (aka Claritin) to treat your allergies. By keeping your allergies under control, we may be able to prevent asthma attacks.  We'll see you in 3 months.   Asthma Attack Prevention While you may not be able to control the fact that you have asthma, you can take actions to prevent asthma attacks. The best way to prevent asthma attacks is to maintain good control of your asthma. You can achieve this by:  Taking your medicines as directed.  Avoiding things that can irritate your airways or make your asthma symptoms worse (asthma triggers).  Keeping track of how well your asthma is controlled and of any changes in your symptoms.  Responding quickly to worsening asthma symptoms (asthma attack).  Seeking emergency care when it is needed. WHAT ARE SOME WAYS TO PREVENT AN ASTHMA ATTACK? Have a Plan Work with your health care provider to create a written plan for managing and treating your asthma attacks (asthma action plan). This plan includes:  A list of your asthma triggers and how you can avoid them.  Information on when medicines should be taken and when their dosages should be changed.  The use of a device that measures how well your lungs are working (peak flow meter). Monitor Your Asthma Use your peak flow meter and record your results in a journal every day. A drop in your peak flow numbers on one or more days may indicate the start of an asthma attack. This can happen even before you start to feel symptoms. You can prevent an asthma attack from getting worse by following the steps in your asthma action plan. Avoid Asthma Triggers Work with your asthma health care provider to find out what your asthma triggers are. This can be done by:  Allergy testing.  Keeping a journal that notes when asthma attacks occur and the factors  that may have contributed to them.  Determining if there are other medical conditions that are making your asthma worse. Once you have determined your asthma triggers, take steps to avoid them. This may include avoiding excessive or prolonged exposure to:  Dust. Have someone dust and vacuum your home for you once or twice a week. Using a high-efficiency particulate arrestance (HEPA) vacuum is best.  Smoke. This includes campfire smoke, forest fire smoke, and secondhand smoke from tobacco products.  Pet dander. Avoid contact with animals that you know you are allergic to.  Allergens from trees, grasses or pollens. Avoid spending a lot of time outdoors when pollen counts are high, and on very windy days.  Very cold, dry, or humid air.  Mold.  Foods that contain high amounts of sulfites.  Strong odors.  Outdoor air pollutants, such as Museum/gallery exhibitions officer.  Indoor air pollutants, such as aerosol sprays and fumes from household cleaners.  Household pests, including dust mites and cockroaches, and pest droppings.  Certain medicines, including NSAIDs. Always talk to your health care provider before stopping or starting any new medicines. Medicines Take over-the-counter and prescription medicines only as told by your health care provider. Many asthma attacks can be prevented by carefully following your medicine schedule. Taking your medicines correctly is especially important when you cannot avoid certain asthma triggers. Act Quickly If an asthma attack does happen, acting quickly can decrease how severe it is and how long it lasts. Take these steps:  Pay attention to your symptoms. If you are coughing, wheezing, or having difficulty breathing, do not wait to see if your symptoms go away on their own. Follow your asthma action plan.  If you have followed your asthma action plan and your symptoms are not improving, call your health care provider or seek immediate medical care at the nearest  hospital. It is important to note how often you need to use your fast-acting rescue inhaler. If you are using your rescue inhaler more often, it may mean that your asthma is not under control. Adjusting your asthma treatment plan may help you to prevent future asthma attacks and help you to gain better control of your condition. HOW CAN I PREVENT AN ASTHMA ATTACK WHEN I EXERCISE? Follow advice from your health care provider about whether you should use your fast-acting inhaler before exercising. Many people with asthma experience exercise-induced bronchoconstriction (EIB). This condition often worsens during vigorous exercise in cold, humid, or dry environments. Usually, people with EIB can stay very active by pre-treating with a fast-acting inhaler before exercising.   This information is not intended to replace advice given to you by your health care provider. Make sure you discuss any questions you have with your health care provider.   Document Released: 08/20/2009 Document Revised: 05/23/2015 Document Reviewed: 02/01/2015 Elsevier Interactive Patient Education [employer] Inc[employer]

## 2016-01-22 NOTE — Progress Notes (Signed)
Internal Medicine Clinic Attending  Case discussed with Dr. Ford at the time of the visit.  We reviewed the resident's history and exam and pertinent patient test results.  I agree with the assessment, diagnosis, and plan of care documented in the resident's note.  

## 2016-05-02 ENCOUNTER — Encounter (HOSPITAL_COMMUNITY): Payer: Self-pay | Admitting: *Deleted

## 2016-05-02 ENCOUNTER — Ambulatory Visit (HOSPITAL_COMMUNITY)
Admission: EM | Admit: 2016-05-02 | Discharge: 2016-05-02 | Disposition: A | Discharge status: Home / Self Care | Payer: 59 | Attending: Internal Medicine | Admitting: Internal Medicine

## 2016-05-02 ENCOUNTER — Ambulatory Visit (INDEPENDENT_AMBULATORY_CARE_PROVIDER_SITE_OTHER): Payer: Self-pay

## 2016-05-02 DIAGNOSIS — J181 Lobar pneumonia, unspecified organism: Principal | ICD-10-CM

## 2016-05-02 DIAGNOSIS — J189 Pneumonia, unspecified organism: Secondary | ICD-10-CM

## 2016-05-02 DIAGNOSIS — Z760 Encounter for issue of repeat prescription: Secondary | ICD-10-CM

## 2016-05-02 MED ORDER — BECLOMETHASONE DIPROPIONATE 40 MCG/ACT IN AERS: 2.0000 | INHALATION_SPRAY | Freq: Every day | RESPIRATORY_TRACT | 12 refills | Status: DC

## 2016-05-02 MED ORDER — IBUPROFEN 600 MG PO TABS: 600.0000 mg | ORAL_TABLET | Freq: Four times a day (QID) | ORAL | 0 refills | Status: DC | PRN

## 2016-05-02 MED ORDER — KETOROLAC TROMETHAMINE 60 MG/2ML IM SOLN
60.0000 mg | Freq: Once | INTRAMUSCULAR | Status: AC
  Administered 2016-05-02: 60 mg via INTRAMUSCULAR

## 2016-05-02 MED ORDER — AZITHROMYCIN 250 MG PO TABS
250.0000 mg | ORAL_TABLET | Freq: Every day | ORAL | 0 refills | Status: DC
Start: 2016-05-02 — End: 2016-06-26

## 2016-05-02 MED ORDER — METHYLPREDNISOLONE SODIUM SUCC 125 MG IJ SOLR
80.0000 mg | Freq: Once | INTRAMUSCULAR | Status: AC
  Administered 2016-05-02: 80 mg via INTRAMUSCULAR

## 2016-05-02 MED ORDER — IPRATROPIUM-ALBUTEROL 0.5-2.5 (3) MG/3ML IN SOLN
3.0000 mL | Freq: Once | RESPIRATORY_TRACT | Status: AC
  Administered 2016-05-02: 3 mL via RESPIRATORY_TRACT

## 2016-05-02 MED ORDER — METHYLPREDNISOLONE SODIUM SUCC 125 MG IJ SOLR
INTRAMUSCULAR | Status: AC
  Filled 2016-05-02: qty 2

## 2016-05-02 MED ORDER — ALBUTEROL SULFATE HFA 108 (90 BASE) MCG/ACT IN AERS: INHALATION_SPRAY | RESPIRATORY_TRACT | 2 refills | Status: DC

## 2016-05-02 MED ORDER — CEFTRIAXONE SODIUM 1 G IJ SOLR
INTRAMUSCULAR | Status: AC
  Filled 2016-05-02: qty 10

## 2016-05-02 MED ORDER — KETOROLAC TROMETHAMINE 60 MG/2ML IM SOLN
INTRAMUSCULAR | Status: AC
  Filled 2016-05-02: qty 2

## 2016-05-02 MED ORDER — CEFTRIAXONE SODIUM 1 G IJ SOLR
1.0000 g | Freq: Once | INTRAMUSCULAR | Status: AC
  Administered 2016-05-02: 1 g via INTRAMUSCULAR

## 2016-05-02 MED ORDER — IPRATROPIUM-ALBUTEROL 0.5-2.5 (3) MG/3ML IN SOLN
RESPIRATORY_TRACT | Status: AC
  Filled 2016-05-02: qty 3

## 2016-05-02 NOTE — ED Provider Notes (Signed)
CSN: [id number]     Arrival date & time 05/02/16  1759 History   First MD Initiated Contact with Patient 05/02/16 1845     Chief Complaint  Patient presents with  . Cough   (Consider location/radiation/quality/duration/timing/severity/associated sxs/prior Treatment) HPI  Kara Haynes is a 25 y.o. female presenting to UC with c/o Right sided and Right lateral chest pain with associated mild intermittent dry cough, and minimal SOB.  She has hx of asthma, ran out of her Qvar about 6 weeks ago and ran out of her albuterol the other week.  She odes not have a PCP but has been hospitalized twice since February 2017 for asthma exacerbated by pneumonia.  Hx of smoking but has not smoked for several months, still around second-hand smoke.  Denies fever, chills, congestion, n/v/d. No leg pain or swelling. Hx of PE but states that pain was in center of chest and she had difficulty breathing. She was treated with anticoagulation therapy for 6 months in 2016. Not currently on blood thinners.   Past Medical History:  Diagnosis Date  . Asthma   . Obesity   . PE (pulmonary embolism)   . Seasonal allergies   . Tobacco abuse   . UTI (lower urinary tract infection)    History reviewed. No pertinent surgical history. Family History  Problem Relation Age of Onset  . Other      denies family h/o clotting d/o  . Asthma Maternal Grandfather   . Hypertension Maternal Grandfather    Social History  Substance Use Topics  . Smoking status: Current Some Day Smoker    Packs/day: 0.50    Types: Cigarettes  . Smokeless tobacco: Not on file     Comment: smoking less 3 cigarettes aday  . Alcohol use 0.0 oz/week     Comment: occ   OB History    No data available     Review of Systems  Constitutional: Negative for chills, fatigue and fever.  HENT: Negative for congestion, rhinorrhea, sinus pressure and sore throat.   Respiratory: Positive for cough and shortness of breath ( minimal). Negative  for chest tightness.   Cardiovascular: Positive for chest pain (Right side). Negative for palpitations.  Gastrointestinal: Negative for diarrhea, nausea and vomiting.  Musculoskeletal: Negative for back pain and myalgias.    Allergies  Review of patient's allergies indicates no known allergies.  Home Medications   Prior to Admission medications   Medication Sig Start Date End Date Taking? Authorizing Provider  albuterol (PROAIR HFA) 108 (90 Base) MCG/ACT inhaler INHALE 1-2 PUFFS INTO THE LUNGS EVERY 6 HOURS AS NEEDED FOR SHORTNES OF BREATH. 05/02/16   Junius FinnerErin O'Malley, PA-C  azithromycin (ZITHROMAX) 250 MG tablet Take 1 tablet (250 mg total) by mouth daily. Take first 2 tablets together, then 1 every day until finished. 05/02/16   Junius FinnerErin O'Malley, PA-C  beclomethasone (QVAR) 40 MCG/ACT inhaler Inhale 2 puffs into the lungs daily. 05/02/16   Junius FinnerErin O'Malley, PA-C  ibuprofen (ADVIL,MOTRIN) 600 MG tablet Take 1 tablet (600 mg total) by mouth every 6 (six) hours as needed. 05/02/16   Junius FinnerErin O'Malley, PA-C  loratadine (CLARITIN) 10 MG tablet Take 10 mg by mouth daily.    Historical Provider, MD   Meds Ordered and Administered this Visit   Medications  ipratropium-albuterol (DUONEB) 0.5-2.5 (3) MG/3ML nebulizer solution 3 mL (3 mLs Nebulization Given 05/02/16 1916)  ketorolac (TORADOL) injection 60 mg (60 mg Intramuscular Given 05/02/16 1916)  methylPREDNISolone sodium succinate (SOLU-MEDROL) 125 mg/2 mL injection  80 mg (80 mg Intramuscular Given 05/02/16 1916)  cefTRIAXone (ROCEPHIN) injection 1 g (1 g Intramuscular Given 05/02/16 1958)    BP 106/72 (BP Location: Left Arm)   Pulse 101   Temp 100.2 F (37.9 C) (Oral)   Resp 20   LMP 04/17/2016   SpO2 96%  No data found.   Physical Exam  Constitutional: She is oriented to person, place, and time. She appears well-developed and well-nourished.  HENT:  Head: Normocephalic and atraumatic.  Right Ear: Tympanic membrane normal.  Left Ear: Tympanic  membrane normal.  Nose: Nose normal.  Mouth/Throat: Uvula is midline, oropharynx is clear and moist and mucous membranes are normal.  Eyes: EOM are normal.  Neck: Normal range of motion.  Cardiovascular: Normal rate.   Pulmonary/Chest: Effort normal. No respiratory distress. She has decreased breath sounds in the right lower field. She has no wheezes. She has no rales. She exhibits tenderness.  Mild tenderness to Right lateral chest wall. No deformity or crepitus.  Lung sounds slightly diminished to Right lower lung fields. Difficult for pt to take deep breath due to pain but no respiratory distress. Able to speak in full sentences w/o difficulty.   Musculoskeletal: Normal range of motion.  Neurological: She is alert and oriented to person, place, and time.  Skin: Skin is warm and dry.  Psychiatric: She has a normal mood and affect. Her behavior is normal.  Nursing note and vitals reviewed.   Urgent Care Course   Clinical Course    Procedures (including critical care time)  Labs Review Labs Reviewed - No data to display  Imaging Review Dg Chest 2 View  Result Date: 05/02/2016 CLINICAL DATA:  Right lower posterior rib pain with dyspnea today. EXAM: CHEST  2 VIEW COMPARISON:  One-view chest x-ray 01/15/2016. FINDINGS: Heart size is normal. A new right lower lobe pneumonia is present. The left lung is clear. There are no effusions. No significant edema is present. The visualized soft tissues and bony thorax are unremarkable. IMPRESSION: 1. New right lower lobe pneumonia. Electronically Signed   By: Marin Robertshristopher  Mattern M.D.   On: 05/02/2016 19:18    MDM   1. Right lower lobe pneumonia   2. Medication refill    Pt with hx of asthma c/o mild cough and Right side chest wall pain.  CXR: c/w RLL pneumonia.  Tx in UC: Toradol 60mg  IM (improved pain), solumedrol 80mg  IM, rocephin 1g IM. Duoneb  Rx: Azithromycin, Qvar, albuterol inhaler, and ibuprofen. Encouraged fluids, rest,  alternate acetaminophen and ibuprofen. Establish care with PCP for ongoing healthcare needs including asthma management. Resource guide provided. Work note provided for 2 days off.    Junius Finnerrin O'Malley, PA-C 05/02/16 2012

## 2016-05-02 NOTE — ED Triage Notes (Signed)
Pt has  Asthma    She  Has  A  Pain in her chest when  She  Coughs    She  Is  Out  Of her  Inhaler

## 2016-05-02 NOTE — Discharge Instructions (Signed)
°Emergency Department Resource Guide °1) Find a Doctor and Pay Out of Pocket °Although you won't have to find out who is covered by your insurance plan, it is a good idea to ask around and get recommendations. You will then need to call the office and see if the doctor you have chosen will accept you as a new patient and what types of options they offer for patients who are self-pay. Some doctors offer discounts or will set up payment plans for their patients who do not have insurance, but you will need to ask so you aren't surprised when you get to your appointment. ° °2) Contact Your Local Health Department °Not all health departments have doctors that can see patients for sick visits, but many do, so it is worth a call to see if yours does. If you don't know where your local health department is, you can check in your phone book. The CDC also has a tool to help you locate your state's health department, and many state websites also have listings of all of their local health departments. ° °3) Find a Walk-in Clinic °If your illness is not likely to be very severe or complicated, you may want to try a walk in clinic. These are popping up all over the country in pharmacies, drugstores, and shopping centers. They're usually staffed by nurse practitioners or physician assistants that have been trained to treat common illnesses and complaints. They're usually fairly quick and inexpensive. However, if you have serious medical issues or chronic medical problems, these are probably not your best option. ° °No Primary Care Doctor: °- Call [facility] at  832-8000 - they can help you locate a primary care doctor that  accepts your insurance, provides certain services, etc. °- Physician Referral Service- 1-800-533-3463 ° °Chronic Pain Problems: °Organization         Address  Phone   Notes  °[facility]  (336) 297-2271 Patients need to be referred by their primary care doctor.  ° °Medication  Assistance: °Organization         Address  Phone   Notes  °[place] [facility] [address] °[place], [place] [address] (336) 641-8030 --Must be a resident of [place] °-- Must have NO insurance coverage whatsoever (no Medicaid/ Medicare, etc.) °-- The pt. MUST have a primary care doctor that directs their care regularly and follows them in the community °  °[facility]  (866) 331-1348   °[employer]  (888) 892-1162   ° °Agencies that provide inexpensive medical care: °Organization         Address  Phone   Notes  °[facility]  (336) 832-8035   °[facility]    (336) 832-7272   °[facility] [address] °[place], [place] [address] (336) 832-4777   °[facility] [address], °[place] (336) 271-4999   °[facility]    (336) 373-0678   °[facility]    (336) 272-1050   °[facility] ° [address], [place] Phone:  (336) 832-4444, Fax:  (336) 832-4440 Hours of Operation:  9 am - 6 pm, M-F.  Also accepts Medicaid/Medicare and self-pay.  °[facility] ° [address], [place] Phone: (336) 832-3150, Fax: (336) [phone]. Hours of Operation:  8:30 am - 5:30 pm, M-F.  Also accepts Medicaid and self-pay.  °[facility] [place] [address], [place] Phone: (336) 878-6027   °[facility] [address], [place], [place] (336)723-1848, Ext. 123 Mondays & Thursdays: 7-9 AM.  First 15 patients are seen on a first come, first serve basis. °  ° °Medicaid-accepting [place] Providers: ° °Organization         Address  Phone   Notes  °[facility] [address], [place] (336) 641-2100 Also accepts self-pay patients.  °[facility] [address], [place] ° (336) 856-9996   °[facility] [address], [place]  (336) 288-8857   °[facility] [address] [place] [address], [place] (336) 299-7000   °Veita Bland [address], [place]  ° (336) 373-1557 Only accepts [place] Access Medicaid patients after they have their name applied to their card.  ° °Self-Pay (no insurance) in [place]: ° °Organization         Address  Phone   Notes  °Sickle Cell Patients, [facility] [address], [place] (336) 832-1970   °[facility] [address], [place] (336) 832-4400   °[facility] ° [address], [place] (336) 992-4800   °[facility]/Dr. Osei-Bonsu ° [address] [place] [address], [place] or [address], [place] (336) 841-8500 Phone number for both [place] and [place] locations is the same.  °[facility] [address], [place] (336) 299-0000   °[facility] [address] [place] [address], [place] or [address] (336) 852-7530 °(336) 878-2260   °[facility] [address], [place] (336) 350-1642, phone; (336) 294-5005, fax Sees patients 1st and 3rd Saturday of every month.  Must not qualify for public or private insurance (i.e. Medicaid, Medicare, [place] Health Choice, Veterans' Benefits) • Household income should be no more than 200% of the poverty level •The clinic cannot treat you if you are pregnant or think you are pregnant • Sexually transmitted diseases are not treated at the clinic.  ° ° °Dental Care: °Organization         Address  Phone  Notes  °[place] [facility] [address], [place] (336) 641-6152 Accepts children up to age 21 who are enrolled in Medicaid or [place] Health Choice; pregnant women with a Medicaid card; and children who have applied for Medicaid or [place] Health Choice, but were declined, whose parents can pay a reduced fee at time of service.  °[place]  [facility] [place]  [address], [place] (336) 641-7733 Accepts children up to age 21 who are enrolled in Medicaid or [place] Health Choice; pregnant women with a Medicaid card; and children who have applied for Medicaid or  Health Choice, but were declined, whose parents can pay a reduced fee at time of service.  °[facility] ° [address], [place] (336) 641-4533 Patients are seen by appointment only. Walk-ins are not accepted. [facility] will see patients 18 years of age and older. °Monday - Tuesday (8am-5pm) °Most Wednesdays (8:30-5pm) °$30 per visit, cash only  °[facility] ° [address], [place] (336) 641-4533 Patients are seen by appointment only. Walk-ins are not accepted. [facility] will see patients 18 years of age and older. °One   Wednesday Evening (Monthly: Volunteer Based).  $30 per visit, cash only  °[facility]  (919) 537-3737 for adults; Children under age 4, call Graduate Pediatric Dentistry at (919) [phone]. Children aged 4-14, please call (919) [phone] to request a pediatric application. ° Dental services are provided in all areas of dental care including fillings, crowns and bridges, complete and partial dentures, implants, gum treatment, root canals, and extractions. Preventive care is also provided. Treatment is provided to both adults and children. °Patients are selected via a lottery and there is often a waiting list. °  °[facility] [address], °[place] ° (336) 763-8833 [email or web address] °  °[facility] [address], [place], [place] (336)723-1848, Ext. 123 Second and Fourth Thursday of each month, opens at 6:30 AM; Clinic ends at 9 AM.  Patients are seen on a first-come first-served basis, and a limited number are seen during each clinic.  ° °[facility] ° [address], [place], [place] (336) 723-7904    Eligibility Requirements °You must have lived in [place], or [place] counties for at least the last three months. °  You cannot be eligible for state or federal sponsored healthcare insurance, including Veterans Administration, Medicaid, or Medicare. °  You generally cannot be eligible for healthcare insurance through your employer.  °  How to apply: °Eligibility screenings are held every Tuesday and Wednesday afternoon from 1:00 pm until 4:00 pm. You do not need an appointment for the interview!  °[facility] [address], [place], [place] 336-631-2330   °[facility] [facility]  336-342-8273   °[facility]  336-703-3100   °[facility]  336-570-6415   ° °Behavioral Health Resources in the Community: °Intensive Outpatient Programs °Organization         Address  Phone  Notes  °[facility] [address], [place], [place] 336-878-6098   °[facility] [address], [place], [place] 336-832-9800   °[facility] [address], [place], [place] ° 336-882-2125   °[facility] [address],  °[place], [place] 1-800-853-5163 or 336-641-4981   °Substance Abuse Resources °Organization         Address  Phone  Notes  °[facility]  336-882-2125   °[facility]  336-784-9470   °[facility]  336-285-9073   °[facility]  336-845-3988   °Residential & Outpatient Substance Abuse Program  1-800-659-3381   °Psychological Services °Organization         Address  Phone  Notes  °[facility]  336- 832-9600   °[facility]  336- 378-7881   °[facility] [address], [place] 1-800-853-5163 or 336-641-4981   ° °Mobile Crisis Teams °Organization         Address  Phone  Notes  °[facility], Mobile Crisis Care Unit  1-877-626-1772   °Assertive °[facility] ° [address]  [place], [place] 336-834-9664   °Sharon DeEsch [address] °[place] 336-554-5454   ° °Self-Help/Support Groups °Organization         Address  Phone             Notes  °[facility]  - variety of support groups  336- 373-1402 Call for more information  °Narcotics Anonymous (NA), [facility] [address], °[place]   2 meetings at this location  ° °  Residential Treatment Programs °Organization         Address  Phone  Notes  °[facility] [address],    °[place]  1-866-801-8205   °[facility] ° [address], [place], [place] 704-293-8524   °[facility] [address], [place] 336-845-3988 Admissions: 8am-3pm M-F  °[facility] [address]    °[place], [place] 336-841-1104   °[facility] [address], [place], [place] 336-379-7146   °[facility] [address]  °[place], [place] 336-285-9073   °[facility] Intensive Outpatient [address], [place], [place] 336-852-3033   °[facility] [address]  °[place], [place] 1-877-615-2722 or 336-784-9470   °[facility] [address] [place], [place] 336-227-7417 Accepts Medicaid  °[facility] [address]  °[place] 1-800-659-3381 Substance Abuse/Addiction Treatment  ° °[place] Behavioral Health Resources °Organization         Address  Phone  Notes  °[facility]  (888) 581-9988   °Julie Brannon, PhD [address] [place], [place]   (336) 349-5553 or (336) 951-0000   °[facility]   [address] °[place], [place] (336) 349-4454   °[facility] [address], [place], [place] (336) 342-8316 Insurance/Medicaid/sponsorship through [facility]  °[facility] [address]                                    [place], [place] (336) 342-8316 Therapy/tele-psych/case    °[facility] [address]  ° [place], [place] (336) 349-2233    °Dr. Arfeen  (336) 349-4544   °[facility] [place]  [employer] [place] [facility] 1) [address], [place] °2) [address], [place] °3)  [address], [place] (336) 349-3220 °(336) 342-7768 ° °(336) 342-8140   °[place] Child [facility] (336) 342-1394 or (336) 342-3537 (After Hours)    ° ° °

## 2016-05-09 ENCOUNTER — Telehealth: Payer: Self-pay | Admitting: Emergency Medicine

## 2016-05-09 NOTE — Telephone Encounter (Signed)
Courtesy callback.  Left voicemail for pt.

## 2016-05-28 ENCOUNTER — Encounter (HOSPITAL_COMMUNITY): Payer: Self-pay | Admitting: Emergency Medicine

## 2016-05-28 ENCOUNTER — Ambulatory Visit (HOSPITAL_COMMUNITY)
Admission: EM | Admit: 2016-05-28 | Discharge: 2016-05-28 | Disposition: A | Discharge status: Home / Self Care | Payer: 59 | Attending: Internal Medicine | Admitting: Internal Medicine

## 2016-05-28 DIAGNOSIS — J45901 Unspecified asthma with (acute) exacerbation: Secondary | ICD-10-CM

## 2016-05-28 DIAGNOSIS — J40 Bronchitis, not specified as acute or chronic: Secondary | ICD-10-CM | POA: Diagnosis not present

## 2016-05-28 MED ORDER — HYDROCODONE-ACETAMINOPHEN 7.5-325 MG/15ML PO SOLN: 10.0000 mL | Freq: Four times a day (QID) | ORAL | 0 refills | Status: DC | PRN

## 2016-05-28 MED ORDER — AZITHROMYCIN 250 MG PO TABS: ORAL_TABLET | ORAL | 0 refills | Status: DC

## 2016-05-28 MED ORDER — IPRATROPIUM-ALBUTEROL 0.5-2.5 (3) MG/3ML IN SOLN
3.0000 mL | Freq: Once | RESPIRATORY_TRACT | Status: AC
  Administered 2016-05-28: 3 mL via RESPIRATORY_TRACT

## 2016-05-28 MED ORDER — PREDNISONE 10 MG PO TABS: ORAL_TABLET | ORAL | 0 refills | Status: DC

## 2016-05-28 MED ORDER — IPRATROPIUM-ALBUTEROL 0.5-2.5 (3) MG/3ML IN SOLN
RESPIRATORY_TRACT | Status: AC
Start: 2016-05-28 — End: 2016-05-28
  Filled 2016-05-28: qty 3

## 2016-05-28 MED ORDER — ALBUTEROL SULFATE HFA 108 (90 BASE) MCG/ACT IN AERS: 1.0000 | INHALATION_SPRAY | Freq: Four times a day (QID) | RESPIRATORY_TRACT | 0 refills | Status: DC | PRN

## 2016-05-28 NOTE — ED Provider Notes (Signed)
CSN: [id number]     Arrival date & time 05/28/16  1900 History   First MD Initiated Contact with Patient 05/28/16 2103     Chief Complaint  Patient presents with  . Cough  . Shortness of Breath   (Consider location/radiation/quality/duration/timing/severity/associated sxs/prior Treatment) HPI History obtained from patient:  Pt presents with the cc of:  Cough and shortness of breath and wheezing Duration of symptoms: 3 days Treatment prior to arrival: Was using an inhaler but ran out Context: Cold symptoms for the last 3 days no wheezing with cough sputum production. Other symptoms include: Some shortness of breath Pain score: 3 FAMILY HISTORY: Pneumonia    Past Medical History:  Diagnosis Date  . Asthma   . Obesity   . PE (pulmonary embolism)   . Seasonal allergies   . Tobacco abuse   . UTI (lower urinary tract infection)    History reviewed. No pertinent surgical history. Family History  Problem Relation Age of Onset  . Other      denies family h/o clotting d/o  . Asthma Maternal Grandfather   . Hypertension Maternal Grandfather    Social History  Substance Use Topics  . Smoking status: Current Some Day Smoker    Packs/day: 0.50    Types: Cigarettes  . Smokeless tobacco: Current User     Comment: smoking less 3 cigarettes aday  . Alcohol use 0.0 oz/week     Comment: occ   OB History    No data available     Review of Systems  Denies: HEADACHE, NAUSEA, ABDOMINAL PAIN, CHEST PAIN, CONGESTION, DYSURIA  Allergies  Review of patient's allergies indicates no known allergies.  Home Medications   Prior to Admission medications   Medication Sig Start Date End Date Taking? Authorizing Provider  albuterol (PROAIR HFA) 108 (90 Base) MCG/ACT inhaler INHALE 1-2 PUFFS INTO THE LUNGS EVERY 6 HOURS AS NEEDED FOR SHORTNES OF BREATH. 05/02/16   Junius Finner, PA-C  azithromycin (ZITHROMAX) 250 MG tablet Take 1 tablet (250 mg total) by mouth daily. Take first 2 tablets  together, then 1 every day until finished. 05/02/16   Junius Finner, PA-C  beclomethasone (QVAR) 40 MCG/ACT inhaler Inhale 2 puffs into the lungs daily. 05/02/16   Junius Finner, PA-C  ibuprofen (ADVIL,MOTRIN) 600 MG tablet Take 1 tablet (600 mg total) by mouth every 6 (six) hours as needed. 05/02/16   Junius Finner, PA-C  loratadine (CLARITIN) 10 MG tablet Take 10 mg by mouth daily.    Historical Provider, MD   Meds Ordered and Administered this Visit   Medications  ipratropium-albuterol (DUONEB) 0.5-2.5 (3) MG/3ML nebulizer solution 3 mL (not administered)  wheeze is better, chest continues to hurt.  BP 115/77 (BP Location: Left Arm)   Pulse 92   Temp 98.4 F (36.9 C) (Oral)   Resp 22   Ht 5\' 2"  (1.575 m)   Wt 212 lb (96.2 kg)   LMP 04/17/2016   SpO2 100%   BMI 38.78 kg/m  No data found.   Physical Exam NURSES NOTES AND VITAL SIGNS REVIEWED. CONSTITUTIONAL: Well developed, well nourished, no acute distress HEENT: normocephalic, atraumatic EYES: Conjunctiva normal NECK:normal ROM, supple, no adenopathy PULMONARY:No respiratory distress, normal effort, wheezing throughout the lung fields without crackles or signs of consolidation ABDOMINAL: Soft, ND, NT BS+, No CVAT MUSCULOSKELETAL: Normal ROM of all extremities,  SKIN: warm and dry without rash PSYCHIATRIC: Mood and affect, behavior are normal  Urgent Care Course   Clinical Course    Procedures (  including critical care time)  Labs Review Labs Reviewed - No data to display  Imaging Review No results found.   Visual Acuity Review  Right Eye Distance:   Left Eye Distance:   Bilateral Distance:    Right Eye Near:   Left Eye Near:    Bilateral Near:         MDM   1. Asthma exacerbation   2. Bronchitis     Patient is reassured that there are no issues that require transfer to higher level of care at this time or additional tests. Patient is advised to continue home symptomatic treatment. Patient is  advised that if there are new or worsening symptoms to attend the emergency department, contact primary care provider, or return to UC. Instructions of care provided discharged home in stable condition.    THIS NOTE WAS GENERATED USING A VOICE RECOGNITION SOFTWARE PROGRAM. ALL REASONABLE EFFORTS  WERE MADE TO PROOFREAD THIS DOCUMENT FOR ACCURACY.  I have verbally reviewed the discharge instructions with the patient. A printed AVS was given to the patient.  All questions were answered prior to discharge.      Tharon AquasFrank C Patrick, PA 05/28/16 2129

## 2016-05-28 NOTE — ED Triage Notes (Signed)
Pt. Stated, I've got a cough and its making me SOB. This started yesterday.

## 2016-05-28 NOTE — ED Notes (Signed)
Pt. Stated, I feel better but still hurt a little bit.

## 2016-06-26 ENCOUNTER — Encounter: Payer: Self-pay | Admitting: Internal Medicine

## 2016-06-26 ENCOUNTER — Ambulatory Visit (INDEPENDENT_AMBULATORY_CARE_PROVIDER_SITE_OTHER): Payer: Self-pay | Admitting: Internal Medicine

## 2016-06-26 VITALS — BP 117/73 | HR 73 | Temp 98.5°F | Ht 62.0 in | Wt 224.5 lb

## 2016-06-26 DIAGNOSIS — F17210 Nicotine dependence, cigarettes, uncomplicated: Secondary | ICD-10-CM

## 2016-06-26 DIAGNOSIS — Z6841 Body Mass Index (BMI) 40.0 and over, adult: Secondary | ICD-10-CM

## 2016-06-26 DIAGNOSIS — J4550 Severe persistent asthma, uncomplicated: Secondary | ICD-10-CM

## 2016-06-26 DIAGNOSIS — Z720 Tobacco use: Secondary | ICD-10-CM | POA: Insufficient documentation

## 2016-06-26 DIAGNOSIS — K760 Fatty (change of) liver, not elsewhere classified: Secondary | ICD-10-CM

## 2016-06-26 MED ORDER — FLUTICASONE-SALMETEROL 100-50 MCG/DOSE IN AEPB: 1.0000 | INHALATION_SPRAY | Freq: Two times a day (BID) | RESPIRATORY_TRACT | 3 refills | Status: DC

## 2016-06-26 MED ORDER — ALBUTEROL SULFATE HFA 108 (90 BASE) MCG/ACT IN AERS: 1.0000 | INHALATION_SPRAY | Freq: Four times a day (QID) | RESPIRATORY_TRACT | 5 refills | Status: DC | PRN

## 2016-06-26 MED ORDER — BECLOMETHASONE DIPROPIONATE 40 MCG/ACT IN AERS: 2.0000 | INHALATION_SPRAY | Freq: Every day | RESPIRATORY_TRACT | 12 refills | Status: DC

## 2016-06-26 NOTE — Progress Notes (Signed)
CC: Asthma HPI: Ms. Kara Haynes is a 25 y.o. female with a h/o of provoked PE, severe asthma who presents for follow up of her asthma.  Please see Problem-based charting for HPI and the status of patient's chronic medical conditions.  Past Medical History:  Diagnosis Date  . Asthma   . Obesity   . PE (pulmonary embolism)   . Seasonal allergies   . Tobacco abuse   . UTI (lower urinary tract infection)     Social Hx: Pt currently smoking 1/2 ppd. 4-5 shots 1-2 times per month alcohol use. Currently working 2 jobs. Enjoys spending time with family.  Review of Systems: ROS in HPI. Otherwise: Review of Systems  Constitutional: Negative for chills, fever and weight loss.  Respiratory: Negative for cough and shortness of breath.   Cardiovascular: Negative for chest pain and leg swelling.  Gastrointestinal: Negative for abdominal pain, constipation, diarrhea, nausea and vomiting.  Genitourinary: Negative for dysuria, frequency and urgency.    Physical Exam: Vitals:   06/26/16 1523  BP: 117/73  Pulse: 73  Temp: 98.5 F (36.9 C)  TempSrc: Oral  SpO2: 100%  Weight: 224 lb 8 oz (101.8 kg)  Height: 5\' 2"  (1.575 m)   Physical Exam  Constitutional: She appears well-developed. She is cooperative. No distress.  Cardiovascular: Normal rate, regular rhythm, normal heart sounds and normal pulses.  Exam reveals no gallop.   No murmur heard. Pulmonary/Chest: Effort normal and breath sounds normal. No respiratory distress. She has no wheezes. She has no rhonchi. She has no rales. Breasts are symmetrical.  Abdominal: Soft. Bowel sounds are normal. There is no tenderness.  Musculoskeletal: She exhibits no edema.    Assessment & Plan:  See encounters tab for problem based medical decision making. Patient seen with Dr. Cleda Daub  Severe persistent asthma Patient long-standing history of asthma. She recently presented to the emergency department one month ago with  complaints of left-sided chest pain which was attributed to asthma exacerbation. She was treated with duo nebs and responded appropriately. She was hospitalized the beginning of this year for asthma exacerbation and related pneumonia. She has since been treated with inhaled corticosteroid and SABA, but has recently been able to afford both. She does not have any inhalers at home currently. She reports that over the last couple weeks she's been doing well with no wheezing attacks but is worried that without inhaler she will have an attack and not have any treatment for it. Given the fact she's been hospitalized for palpitations of asthma she definitely qualify for LABA and ICS therapy. I will prescribe Advair which she can obtain through the health Department as medication assistance pharmacy. We will also give 2 month samples of Asmanex which she can use as a bridge until she is able to establish care with this pharmacy. She also reports significant triggers with allergies such as grass pollen and dust, I will give her some sample of Claritin that she can use as needed for allergy symptoms. Have also provided a sample of Proventil and have sent a prescription to the health department for this medication to be used as needed. Patient is currently asymptomatic and breath sounds are clear bilaterally. We'll plan to follow-up in 6 months or earlier as needed.  Hepatic steatosis Patient has a history of pulmonary embolism in 2016 which was provoked and has completed anticoagulation therapy. During her hospitalization for this event she had a CT of the chest which had incidental findings of hepatic  steatosis. On further evaluation today she reports moderate alcohol use which occurs 2-3 times monthly and is significant for 4-5 shots of liquor. She denies any beer or other alcohol use on a regular basis. Patient is also overweight. 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 She had multiple questions all of which were answered. She was also provided with information regarding this and she can review on her own. I encouraged her to call our clinic if she comes in with any further questions. We recommended weight loss and healthy activity and diet as the primary treatments at present. In addition we recommended alcohol moderation. We will continue to monitor as needed.  Tobacco abuse Patient has a 10-pack-year history of tobacco use and is currently smoking half pack per day. She reports that she is currently pretty contemplative and is not interested in quitting, however she does endorse that she is aware of the negative health effects especially with regards to her asthma. Patient was counseled on multiple options for helping to quit if she ever decides she said she did this in the future. She reports that she understands these options available and feels that it is mostly her mind over matter issue for her. She did note that she may be interested in trying nicotine replacement gum in the future but is not adjusted at this time. We will continue to address in the future   Signed: Carolynn CommentBryan Rockell Faulks, MD 06/26/2016, 5:13 PM  Pager: [phone]

## 2016-06-26 NOTE — Assessment & Plan Note (Signed)
Patient has a history of pulmonary embolism in 2016 which was provoked and has completed anticoagulation therapy. During her hospitalization for this event she had a CT of the chest which had incidental findings of hepatic steatosis. On further evaluation today she reports moderate alcohol use which occurs 2-3 times monthly and is significant for 4-5 shots of liquor. She denies any beer or other alcohol use on a regular basis. Patient is also overweight. 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 She had multiple questions all of which were answered. She was also provided with information regarding this and she can review on her own. I encouraged her to call our clinic if she comes in with any further questions. We recommended weight loss and healthy activity and diet as the primary treatments at present. In addition we recommended alcohol moderation. We will continue to monitor as needed.

## 2016-06-26 NOTE — Assessment & Plan Note (Addendum)
Patient long-standing history of asthma. She recently presented to the emergency department one month ago with complaints of left-sided chest pain which was attributed to asthma exacerbation. She was treated with duo nebs and responded appropriately. She was hospitalized the beginning of this year for asthma exacerbation and related pneumonia. She has since been treated with inhaled corticosteroid and SABA, but has recently been able to afford both. She does not have any inhalers at home currently. She reports that over the last couple weeks she's been doing well with no wheezing attacks but is worried that without inhaler she will have an attack and not have any treatment for it. Given the fact she's been hospitalized for palpitations of asthma she definitely qualify for LABA and ICS therapy. I will prescribe Advair which she can obtain through the health Department as medication assistance pharmacy. We will also give 2 month samples of Asmanex which she can use as a bridge until she is able to establish care with this pharmacy. She also reports significant triggers with allergies such as grass pollen and dust, I will give her some sample of Claritin that she can use as needed for allergy symptoms. Have also provided a sample of Proventil and have sent a prescription to the health department for this medication to be used as needed. Patient is currently asymptomatic and breath sounds are clear bilaterally. We'll plan to follow-up in 6 months or earlier as needed.

## 2016-06-26 NOTE — Assessment & Plan Note (Signed)
Patient has a 10-pack-year history of tobacco use and is currently smoking half pack per day. She reports that she is currently pretty contemplative and is not interested in quitting, however she does endorse that she is aware of the negative health effects especially with regards to her asthma. Patient was counseled on multiple options for helping to quit if she ever decides she said she did this in the future. She reports that she understands these options available and feels that it is mostly her mind over matter issue for her. She did note that she may be interested in trying nicotine replacement gum in the future but is not adjusted at this time. We will continue to address in the future

## 2016-06-26 NOTE — Patient Instructions (Addendum)
Thanks for your visit today. I will prescribe a daily steroid inhaler called Advair which should help to control your asthma. You should continue to take your albuterol inhaler when you have episodes of wheezing. I will prescribe you two samples of a similar medication called asmonex which should help until you are able to have your prescription for Advair filled. I will also give you a sample of Claritin which you can use when you notice that your allergies are flaring up.  I would also recommend continuing to think about quitting smoking. This will be the single best thing you can do for your heath.  It was GREAT to meet you today. I look forward to seeing your in the future.   Fatty Liver Fatty liver, also called hepatic steatosis or steatohepatitis, is a condition in which too much fat has built up in your liver cells. The liver removes harmful substances from your bloodstream. It produces fluids your body needs. It also helps your body use and store energy from the food you eat. In many cases, fatty liver does not cause symptoms or problems. It is often diagnosed when tests are being done for other reasons. However, over time, fatty liver can cause inflammation that may lead to more serious liver problems, such as scarring of the liver (cirrhosis). CAUSES  Causes of fatty liver may include:   Drinking too much alcohol.  Poor nutrition.  Obesity.  Cushing syndrome.  Diabetes.  Hyperlipidemia.  Pregnancy.  Certain drugs.  Poisons.  Some viral infections. RISK FACTORS You may be more likely to develop fatty liver if you:  Abuse alcohol.  Are pregnant.  Are overweight.  Have diabetes.  Have hepatitis.  Have a high triglyceride level.  SIGNS AND SYMPTOMS  Fatty liver often does not cause any symptoms. In cases where symptoms develop, they can include:  Fatigue.  Weakness.  Weight loss.  Confusion.   Abdominal pain.  Yellowing of your skin and the white  parts of your eyes (jaundice).  Nausea and vomiting. DIAGNOSIS  Fatty liver may be diagnosed by:   Physical exam and medical history.  Blood tests.  Imaging tests, such as an ultrasound, CT scan, or MRI.  Liver biopsy. A small sample of liver tissue is removed using a needle. The sample is then looked at under a microscope. TREATMENT  Fatty liver is often caused by other health conditions. Treatment for fatty liver may involve medicines and lifestyle changes to manage conditions such as:   Alcoholism.  High cholesterol.  Diabetes.  Being overweight or obese.  HOME CARE INSTRUCTIONS  Eat a healthy diet as directed by your health care provider.  Exercise regularly. This can help you lose weight and control your cholesterol and diabetes. Talk to your health care provider about an exercise plan and which activities are best for you.  Do not drink alcohol.   Take medicines only as directed by your health care provider. SEEK MEDICAL CARE IF: You have difficulty controlling your:  Blood sugar.  Cholesterol.  Alcohol consumption. SEEK IMMEDIATE MEDICAL CARE IF:  You have abdominal pain.  You have jaundice.  You have nausea and vomiting.   This information is not intended to replace advice given to you by your health care provider. Make sure you discuss any questions you have with your health care provider.   Document Released: 10/17/2005 Document Revised: 09/22/2014 Document Reviewed: 01/11/2014 Elsevier Interactive Patient Education 2016 [employer]  Nonalcoholic Fatty Liver Disease Diet Nonalcoholic fatty liver disease  is a condition that causes fat to accumulate in and around the liver. The disease makes it harder for the liver to work the way that it should. Following a healthy diet can help to keep nonalcoholic fatty liver disease under control. It can also help to prevent or improve conditions that are associated with the disease, such as heart disease,  diabetes, high blood pressure, and abnormal cholesterol levels. Along with regular exercise, this diet:  Promotes weight loss.  Helps to control blood sugar levels.  Helps to improve the way that the body uses insulin. WHAT DO I NEED TO KNOW ABOUT THIS DIET?  Use the glycemic index (GI) to plan your meals. The index tells you how quickly a food will raise your blood sugar. Choose low-GI foods. These foods take a longer time to raise blood sugar.  Keep track of how many calories you take in. Eating the right amount of calories will help you to achieve a healthy weight.  You may want to follow a Mediterranean diet. This diet includes a lot of vegetables, lean meats or fish, whole grains, fruits, and healthy oils and fats. WHAT FOODS CAN I EAT? Grains Whole grains, such as whole-wheat or whole-grain breads, crackers, tortillas, cereals, and pasta. Stone-ground whole wheat. Pumpernickel bread. Unsweetened oatmeal. Bulgur. Barley. Quinoa. Brown or wild rice. Corn or whole-wheat flour tortillas. Vegetables Lettuce. Spinach. Peas. Beets. Cauliflower. Cabbage. Broccoli. Carrots. Tomatoes. Squash. Eggplant. Herbs. Peppers. Onions. Cucumbers. Brussels sprouts. Yams and sweet potatoes. Beans. Lentils. Fruits Bananas. Apples. Oranges. Grapes. Papaya. Mango. Pomegranate. Kiwi. Grapefruit. Cherries. Meats and Other Protein Sources Seafood and shellfish. Lean meats. Poultry. Tofu. Dairy Low-fat or fat-free dairy products, such as yogurt, cottage cheese, and cheese. Beverages Water. Sugar-free drinks. Tea. Coffee. Low-fat or skim milk. Milk alternatives, such as soy or almond milk. Real fruit juice. Condiments Mustard. Relish. Low-fat, low-sugar ketchup and barbecue sauce. Low-fat or fat-free mayonnaise. Sweets and Desserts Sugar-free sweets. Fats and Oils Avocado. Canola or olive oil. Nuts and nut butters. Seeds. The items listed above may not be a complete list of recommended foods or beverages.  Contact your dietitian for more options.  WHAT FOODS ARE NOT RECOMMENDED? Palm oil and coconut oil. Processed foods. Fried foods. Sweetened drinks, such as sweet tea, milkshakes, snow cones, iced sweet drinks, and sodas. Alcohol. Sweets. Foods that contain a lot of salt or sodium. The items listed above may not be a complete list of foods and beverages to avoid. Contact your dietitian for more information.   This information is not intended to replace advice given to you by your health care provider. Make sure you discuss any questions you have with your health care provider.   Document Released: 01/16/2015 Document Reviewed: 01/16/2015 Elsevier Interactive Patient Education [employer]

## 2016-07-01 NOTE — Progress Notes (Signed)
Internal Medicine Clinic Attending  I saw and evaluated the patient.  I personally confirmed the key portions of the history and exam documented by Dr. Strelow and I reviewed pertinent patient test results.  The assessment, diagnosis, and plan were formulated together and I agree with the documentation in the resident's note. 

## 2018-01-05 ENCOUNTER — Emergency Department (HOSPITAL_COMMUNITY)
Admission: EM | Admit: 2018-01-05 | Discharge: 2018-01-05 | Disposition: A | Discharge status: Home / Self Care | Payer: Self-pay | Attending: Emergency Medicine | Admitting: Emergency Medicine

## 2018-01-05 ENCOUNTER — Encounter (HOSPITAL_COMMUNITY): Payer: Self-pay

## 2018-01-05 ENCOUNTER — Other Ambulatory Visit: Payer: Self-pay

## 2018-01-05 DIAGNOSIS — J4550 Severe persistent asthma, uncomplicated: Secondary | ICD-10-CM | POA: Insufficient documentation

## 2018-01-05 DIAGNOSIS — R0602 Shortness of breath: Secondary | ICD-10-CM | POA: Insufficient documentation

## 2018-01-05 DIAGNOSIS — R220 Localized swelling, mass and lump, head: Secondary | ICD-10-CM | POA: Insufficient documentation

## 2018-01-05 DIAGNOSIS — F17210 Nicotine dependence, cigarettes, uncomplicated: Secondary | ICD-10-CM | POA: Insufficient documentation

## 2018-01-05 DIAGNOSIS — Z7901 Long term (current) use of anticoagulants: Secondary | ICD-10-CM | POA: Insufficient documentation

## 2018-01-05 MED ORDER — EPINEPHRINE 0.3 MG/0.3ML IJ SOAJ
0.3000 mg | INTRAMUSCULAR | Status: DC | PRN
  Administered 2018-01-05: 0.3 mg via INTRAMUSCULAR
  Filled 2018-01-05: qty 0.3

## 2018-01-05 MED ORDER — DIPHENHYDRAMINE HCL 25 MG PO CAPS
25.0000 mg | ORAL_CAPSULE | Freq: Once | ORAL | Status: AC
  Administered 2018-01-05: 25 mg via ORAL
  Filled 2018-01-05: qty 1

## 2018-01-05 MED ORDER — FAMOTIDINE 20 MG PO TABS: 20.0000 mg | ORAL_TABLET | Freq: Two times a day (BID) | ORAL | 0 refills | Status: DC

## 2018-01-05 MED ORDER — FAMOTIDINE 20 MG PO TABS
20.0000 mg | ORAL_TABLET | Freq: Once | ORAL | Status: AC
  Administered 2018-01-05: 20 mg via ORAL
  Filled 2018-01-05: qty 1

## 2018-01-05 MED ORDER — EPINEPHRINE 0.3 MG/0.3ML IJ SOAJ: 0.3000 mg | Freq: Once | INTRAMUSCULAR | 0 refills | Status: AC

## 2018-01-05 MED ORDER — CETIRIZINE HCL 10 MG PO TABS: 10.0000 mg | ORAL_TABLET | Freq: Every day | ORAL | 0 refills | Status: DC

## 2018-01-05 MED ORDER — PREDNISONE 20 MG PO TABS
40.0000 mg | ORAL_TABLET | Freq: Once | ORAL | Status: AC
  Administered 2018-01-05: 40 mg via ORAL
  Filled 2018-01-05: qty 2

## 2018-01-05 MED ORDER — PREDNISONE 20 MG PO TABS: 40.0000 mg | ORAL_TABLET | Freq: Every day | ORAL | 0 refills | Status: AC

## 2018-01-05 NOTE — Discharge Instructions (Addendum)
Please read and follow all provided instructions.  Your diagnoses today include:  1. Shortness of breath   2. Lip swelling    Your examination is very reassuring today.  I feel that your examination is most consistent with a severe allergy, what we call anaphylaxis, however there are multiple causes.  Until you have a clear history of panic attacks, we cannot call something a panic attack.  I also feel you are low risk today for pulmonary embolism, however it is very important that you come back if you develop any shortness of breath, chest pain, leg swelling, or any symptoms that are consistent with your prior history of Pe.  Tests performed today include: Vital signs. See below for your results today.   Medications prescribed:   Take any prescribed medications only as directed.  See the attached instructions on how to use an EpiPen.  This should ONLY be used in the event that she experiences syndrome to or more of the following :  lip or oral swelling, shortness of breath, itching and hives, abdominal cramping and diarrhea.  An episode that she had today would qualify for this type of intervention.  Anytime you have to use an EpiPen, you should come to the emergency department after it was used.  You are prescribed prednisone, a steroid. This is a medication to help reduce inflammation.  Common side effects include upset stomach/nausea. You may take this medicine with food if this occurs. Other side effects include restlessness, difficulty sleeping, and increased sweating. Call your healthcare provider if these do not resolve after finishing the medication.  This medicine may increase your blood sugar so additional careful monitoring is needed of blood sugar if you have diabetes. Call your healthcare provider for any signs/symtpoms of high blood sugar such as confusion, feeling sleepy, more thirst, more hunger, passing urine more often, flushing, fast breathing, or breath that smells like  fruit.  I recommend taking the Pepcid I prescribed twice daily, as well as Zyrtec once daily for the next 2-3 days.  These are antihistamines that help prevent any further allergy.  Home care instructions:  Follow any educational materials contained in this packet  Follow-up instructions: Please follow-up with your primary care provider in the next 3 days for further evaluation of your symptoms.   Please help with the primary care clinics that I listed.  It is very important that you establish primary care.  Please follow-up with allergy and immunology to determine if you truly have allergies to the possible triggers you experience today.  Return instructions:  Please return to the Emergency Department if you experience worsening symptoms.  Call 9-1-1 immediately if you have an allergic reaction that involves your lips, mouth, throat or if you have any difficulty breathing. This is a life-threatening emergency.  Chest pain or shortness of breath. Please return if you have any other emergent concerns.  Additional Information:  Your vital signs today were: BP 110/80    Pulse 65    Temp 98.3 F (36.8 C) (Oral)    Resp 16    Ht 5' 1.5" (1.562 m)    LMP 12/14/2017    SpO2 100% Comment: while ambulating   BMI 41.73 kg/m  If your blood pressure (BP) was elevated above 130/80 this visit, please have this repeated by your doctor within one month. --------------  Thank you for allowing us to participate in your care today!

## 2018-01-05 NOTE — ED Triage Notes (Addendum)
Patient was at work, stating that she was walking her station at work, started breathing heavy and fast and then she felt like she might pass out. Patient states she had lower lip swelling this AM when she woke and still has slight lower lip swelling. Patient denies feeling anxious or feeling like she is going to pass out. Patient states she has slight nausea at this time. Patient states she had a panic attack when she had a family member die. Patient states it feels kinda the same.

## 2018-01-05 NOTE — ED Triage Notes (Signed)
Per [facility] pt comes from work where she was c/o being hot and nauseated.  Also reported to EMS that she thought was having allergic reaction bc she felt lip swelling 3 hours after eating tilapia.

## 2018-01-05 NOTE — ED Provider Notes (Signed)
[facility] Note   CSN: [id number] Arrival date & time: 01/05/18  1408     History   Chief Complaint Chief Complaint  Patient presents with  . Panic Attack    HPI Kara Haynes is a 27 y.o. female.  HPI  Patient is a 26-year female with a history of pulmonary embolism, asthma, obesity, and allergic rhinitis presenting for flushing sensation, shortness of breath, and lower lip swelling.  Patient reports that this morning she woke up with some lower lip swelling, that she did not make much note of at the time.  Patient reports that she ate lunch consisting of tilapia and some [place], and noticed that after lunch she had some increasing swelling to her lower lip.  At the same time, patient began to feel some lower abdominal cramping and nausea that persisted until she got to work.  Patient presented to work, and had an episode where she felt extremely warm and flushed, followed by shortness of breath, hyperventilation, and wheezing.  Patient reports she took her home inhaler which improved her symptoms.  Patient reports that her manager called EMS due to the hyperventilation.  Patient reports that she was not given any medications per EMS, and continued to have flushing and tachypnea on presentation in the emergency department.  Patient reports that she waited for several hours to be seen, and by the time she was seen, she felt completely back to her baseline.  Patient reports that this episode felt slightly similar to a "panic attack" she was told she had around the time her nephew passed away.  Patient does have a history of pulmonary embolism, and is not anticoagulated at present, but reports that that episode felt very different from her current episode.  Patient is denying any chest pain, shortness of breath, posterior pharynx swelling, wheezing, stridor, lingual swelling at present, abdominal cramping or diarrhea.  Patient has no  known food allergies, but reports she had a different tilapia today than she typically has.  Past Medical History:  Diagnosis Date  . Asthma   . Obesity   . PE (pulmonary embolism)   . Seasonal allergies   . Tobacco abuse   . UTI (lower urinary tract infection)     Patient Active Problem List   Diagnosis Date Noted  . Hepatic steatosis 06/26/2016  . Tobacco abuse 06/26/2016  . AKI (acute kidney injury) (HCC) 01/15/2016  . History of pulmonary embolism 10/26/2015  . Marijuana use 10/26/2015  . Preventative health care 11/22/2014  . Metrorrhagia 10/05/2014  . Abnormal chest CT 08/09/2014  . Severe persistent asthma 08/09/2014    History reviewed. No pertinent surgical history.   OB History   None      Home Medications    Prior to Admission medications   Medication Sig Start Date End Date Taking? Authorizing Provider  albuterol (PROVENTIL HFA;VENTOLIN HFA) 108 (90 Base) MCG/ACT inhaler Inhale 1-2 puffs into the lungs every 6 (six) hours as needed for wheezing or shortness of breath. 06/26/16   Carolynn CommentStrelow, Bryan, MD  Fluticasone-Salmeterol (ADVAIR DISKUS) 100-50 MCG/DOSE AEPB Inhale 1 puff into the lungs 2 (two) times daily. 06/26/16 06/26/17  Carolynn CommentStrelow, Bryan, MD  ibuprofen (ADVIL,MOTRIN) 600 MG tablet Take 1 tablet (600 mg total) by mouth every 6 (six) hours as needed. 05/02/16   Lurene ShadowPhelps, Erin O, PA-C  loratadine (CLARITIN) 10 MG tablet Take 10 mg by mouth daily.    [provider]  rivaroxaban (XARELTO) 20 MG TABS  tablet Take 1 tablet (20 mg total) by mouth daily with supper. Start after you complete the 15mg  tablets. Patient not taking: Reported on 07/09/2015 10/05/14 07/10/15  Onnie Boer, MD    Family History Family History  Problem Relation Age of Onset  . Other Unknown        denies family h/o clotting d/o  . Asthma Maternal Grandfather   . Hypertension Maternal Grandfather     Social History Social History   Tobacco Use  . Smoking status:  Current Some Day Smoker    Packs/day: 0.20    Types: Cigarettes  . Smokeless tobacco: Never Used  . Tobacco comment: smoking less 5-6  cigarettes aday  Substance Use Topics  . Alcohol use: Yes    Alcohol/week: 0.0 oz    Comment: occ  . Drug use: Yes    Types: Marijuana     Allergies   Patient has no known allergies.   Review of Systems Review of Systems  Constitutional: Negative for chills and fever.  HENT: Negative for congestion, rhinorrhea, sore throat, trouble swallowing and voice change.   Respiratory: Negative for chest tightness, shortness of breath, wheezing and stridor.   Cardiovascular: Negative for chest pain, palpitations and leg swelling.  Gastrointestinal: Negative for abdominal pain, diarrhea, nausea and vomiting.  Genitourinary: Negative for dysuria.  Musculoskeletal: Negative for myalgias.  Skin: Negative for rash.  Neurological: Negative for dizziness.  All other systems reviewed and are negative.    Physical Exam Updated Vital Signs BP 110/80   Pulse 65   Temp 98.3 F (36.8 C) (Oral)   Resp 16   Ht 5' 1.5" (1.562 m)   LMP 12/14/2017   SpO2 100% Comment: while ambulating  BMI 41.73 kg/m   Physical Exam  Constitutional: She appears well-developed and well-nourished. No distress.  HENT:  Head: Normocephalic and atraumatic.  Mouth/Throat: Oropharynx is clear and moist.  Eyes: Pupils are equal, round, and reactive to light. Conjunctivae and EOM are normal.  Neck: Normal range of motion. Neck supple.  Cardiovascular: Normal rate, regular rhythm, S1 normal and S2 normal.  No murmur heard. Pulmonary/Chest: Effort normal and breath sounds normal. She has no wheezes. She has no rales.  Abdominal: Soft. She exhibits no distension. There is no tenderness. There is no guarding.  Musculoskeletal: Normal range of motion. She exhibits no edema or deformity.  Lymphadenopathy:    She has no cervical adenopathy.  Neurological: She is alert.  Cranial nerves  grossly intact. Patient moves extremities symmetrically and with good coordination.  Skin: Skin is warm and dry. No rash noted. No erythema.  Posterior right arm exhibits maculopapular scaling rash, not consistent with urticaria.  Psychiatric: She has a normal mood and affect. Her behavior is normal. Judgment and thought content normal.  Nursing note and vitals reviewed.    ED Treatments / Results  Labs (all labs ordered are listed, but only abnormal results are displayed) Labs Reviewed  POC URINE PREG, ED    EKG EKG Interpretation  Date/Time:  Tuesday January 05 2018 19:29:29 EDT Ventricular Rate:  68 PR Interval:    QRS Duration: 89 QT Interval:  389 QTC Calculation: 414 R Axis:   30 Text Interpretation:  Sinus rhythm LVH by voltage rate is decreased from prior Otherwise no significant change Confirmed by Shaune Pollack [phone]) on 01/05/2018 7:41:36 PM   Radiology No results found.  Procedures Procedures (including critical care time)  Medications Ordered in ED Medications  EPINEPHrine (EPI-PEN) injection 0.3 mg (  0.3 mg Intramuscular Provided for home use 01/05/18 1946)  predniSONE (DELTASONE) tablet 40 mg (40 mg Oral Given 01/05/18 1946)  famotidine (PEPCID) tablet 20 mg (20 mg Oral Given 01/05/18 1946)  diphenhydrAMINE (BENADRYL) capsule 25 mg (25 mg Oral Given 01/05/18 1946)     Initial Impression / Assessment and Plan / ED Course  I have reviewed the triage vital signs and the nursing notes.  Pertinent labs & imaging results that were available during my care of the patient were reviewed by me and considered in my medical decision making (see chart for details).     Patient nontoxic-appearing, afebrile, and in no acute distress on my examination.  Patient reporting he feels completely back to her baseline.  Differential diagnosis includes anaphylaxis, pulmonary aneurysm, panic attack, asthma exacerbation.  Do not suspect pulmonary embolism, as patient is bradycardic  on my examination, 100% on room air, and not tachypneic.  Patient, having a history of PE, reports that this is completely different and she does not feel that this is similar.  Additionally, patient ambulated in emergency department and maintained 100% on room air with ambulation, and pulse 65 while ambulating.  This is not clinically consistent with pulmonary embolism.  Patient is not wheezing at present therefore doubt that patient is having true asthma exacerbation.  Given the multiorgan system involvement with the exception of skin, feel most suspicious for an allergic reaction that patient should be prophylactically provided treatment for including EpiPen.  This was discussed with Dr. Shaune Pollack, who is in agreement with this plan.  I discussed with the patient that panic attacks are diagnosis of exclusion, she develops a history consistent with such, this is something she may be treated for at some point patient was given education on how to use an EpiPen if she has a similar episode that has multiorgan system involvement in the setting of an allergen exposure,.  As well as instructed to return anytime she uses EpiPen, for any oral or lingual swelling, difficulty breathing, or chest pain.  Patient is in understanding and agrees with the plan of care.  EKG with evidence of slight LVH, decreased from prior.  No evidence of ischemia, infarction, or arrhythmia.  Patient refused urine pregnancy test.  I discussed its utility, in the setting of nausea as well as some irregular bleeding patient has had recently.  Patient in understanding.   Final Clinical Impressions(s) / ED Diagnoses   Final diagnoses:  Shortness of breath  Lip swelling    ED Discharge Orders        Ordered    predniSONE (DELTASONE) 20 MG tablet  Daily     01/05/18 2016    famotidine (PEPCID) 20 MG tablet  2 times daily     01/05/18 2016    cetirizine (ZYRTEC) 10 MG tablet  Daily     01/05/18 2016    EPINEPHrine 0.3 mg/0.3  mL IJ SOAJ injection   Once     01/05/18 2018       Delia Chimes 01/05/18 2051    Shaune Pollack, MD 01/05/18 2159

## 2018-01-05 NOTE — ED Notes (Signed)
Patient made aware urine sample is needed. 

## 2018-01-10 ENCOUNTER — Emergency Department (HOSPITAL_COMMUNITY): Payer: Self-pay

## 2018-01-10 ENCOUNTER — Emergency Department (HOSPITAL_COMMUNITY)
Admission: EM | Admit: 2018-01-10 | Discharge: 2018-01-11 | Disposition: A | Discharge status: Home / Self Care | Payer: Self-pay | Attending: Family Medicine | Admitting: Family Medicine

## 2018-01-10 ENCOUNTER — Ambulatory Visit (HOSPITAL_COMMUNITY)
Admission: EM | Admit: 2018-01-10 | Discharge: 2018-01-10 | Disposition: A | Discharge status: Home / Self Care | Payer: Self-pay | Attending: Internal Medicine | Admitting: Internal Medicine

## 2018-01-10 ENCOUNTER — Encounter (HOSPITAL_COMMUNITY): Payer: Self-pay | Admitting: Family Medicine

## 2018-01-10 ENCOUNTER — Encounter (HOSPITAL_COMMUNITY): Payer: Self-pay | Admitting: Emergency Medicine

## 2018-01-10 ENCOUNTER — Ambulatory Visit (INDEPENDENT_AMBULATORY_CARE_PROVIDER_SITE_OTHER): Payer: Self-pay

## 2018-01-10 DIAGNOSIS — R05 Cough: Secondary | ICD-10-CM

## 2018-01-10 DIAGNOSIS — Z86711 Personal history of pulmonary embolism: Secondary | ICD-10-CM

## 2018-01-10 DIAGNOSIS — R0682 Tachypnea, not elsewhere classified: Secondary | ICD-10-CM

## 2018-01-10 DIAGNOSIS — Z720 Tobacco use: Secondary | ICD-10-CM

## 2018-01-10 DIAGNOSIS — F17210 Nicotine dependence, cigarettes, uncomplicated: Secondary | ICD-10-CM | POA: Insufficient documentation

## 2018-01-10 DIAGNOSIS — Z9109 Other allergy status, other than to drugs and biological substances: Secondary | ICD-10-CM

## 2018-01-10 DIAGNOSIS — J209 Acute bronchitis, unspecified: Secondary | ICD-10-CM | POA: Insufficient documentation

## 2018-01-10 DIAGNOSIS — R0789 Other chest pain: Secondary | ICD-10-CM

## 2018-01-10 DIAGNOSIS — R079 Chest pain, unspecified: Secondary | ICD-10-CM

## 2018-01-10 DIAGNOSIS — R0602 Shortness of breath: Secondary | ICD-10-CM

## 2018-01-10 DIAGNOSIS — R058 Other specified cough: Secondary | ICD-10-CM

## 2018-01-10 DIAGNOSIS — J4540 Moderate persistent asthma, uncomplicated: Secondary | ICD-10-CM

## 2018-01-10 DIAGNOSIS — J45901 Unspecified asthma with (acute) exacerbation: Secondary | ICD-10-CM | POA: Diagnosis present

## 2018-01-10 DIAGNOSIS — J302 Other seasonal allergic rhinitis: Secondary | ICD-10-CM | POA: Insufficient documentation

## 2018-01-10 DIAGNOSIS — Z79899 Other long term (current) drug therapy: Secondary | ICD-10-CM | POA: Insufficient documentation

## 2018-01-10 DIAGNOSIS — J4541 Moderate persistent asthma with (acute) exacerbation: Secondary | ICD-10-CM

## 2018-01-10 DIAGNOSIS — J40 Bronchitis, not specified as acute or chronic: Secondary | ICD-10-CM

## 2018-01-10 DIAGNOSIS — F17200 Nicotine dependence, unspecified, uncomplicated: Secondary | ICD-10-CM

## 2018-01-10 LAB — CBC WITH DIFFERENTIAL/PLATELET
Basophils Absolute: 0 10*3/uL (ref 0.0–0.1)
Basophils Relative: 0 %
Eosinophils Absolute: 0 10*3/uL (ref 0.0–0.7)
Eosinophils Relative: 1 %
HCT: 40.5 % (ref 36.0–46.0)
Hemoglobin: 13.8 g/dL (ref 12.0–15.0)
Lymphocytes Relative: 22 %
Lymphs Abs: 1.7 10*3/uL (ref 0.7–4.0)
MCH: 29.2 pg (ref 26.0–34.0)
MCHC: 34.1 g/dL (ref 30.0–36.0)
MCV: 85.8 fL (ref 78.0–100.0)
Monocytes Absolute: 0.9 10*3/uL (ref 0.1–1.0)
Monocytes Relative: 12 %
Neutro Abs: 4.9 10*3/uL (ref 1.7–7.7)
Neutrophils Relative %: 65 %
Platelets: 255 10*3/uL (ref 150–400)
RBC: 4.72 MIL/uL (ref 3.87–5.11)
RDW: 13.8 % (ref 11.5–15.5)
WBC: 7.5 10*3/uL (ref 4.0–10.5)

## 2018-01-10 LAB — I-STAT TROPONIN, ED: Troponin i, poc: 0 ng/mL (ref 0.00–0.08)

## 2018-01-10 LAB — COMPREHENSIVE METABOLIC PANEL
ALBUMIN: 3.6 g/dL (ref 3.5–5.0)
ALT: 10 U/L — ABNORMAL LOW (ref 14–54)
AST: 13 U/L — AB (ref 15–41)
Alkaline Phosphatase: 59 U/L (ref 38–126)
Anion gap: 12 (ref 5–15)
BUN: 6 mg/dL (ref 6–20)
CO2: 21 mmol/L — ABNORMAL LOW (ref 22–32)
Calcium: 8.5 mg/dL — ABNORMAL LOW (ref 8.9–10.3)
Chloride: 100 mmol/L — ABNORMAL LOW (ref 101–111)
Creatinine, Ser: 0.96 mg/dL (ref 0.44–1.00)
GFR calc Af Amer: >60 mL/min (ref >60)
GLUCOSE: 109 mg/dL — AB (ref 65–99)
POTASSIUM: 3.6 mmol/L (ref 3.5–5.1)
Sodium: 133 mmol/L — ABNORMAL LOW (ref 135–145)
Total Bilirubin: 1.5 mg/dL — ABNORMAL HIGH (ref 0.3–1.2)
Total Protein: 6.9 g/dL (ref 6.5–8.1)

## 2018-01-10 LAB — I-STAT BETA HCG BLOOD, ED (MC, WL, AP ONLY): I-stat hCG, quantitative: <5 m[IU]/mL (ref <5)

## 2018-01-10 MED ORDER — IPRATROPIUM-ALBUTEROL 0.5-2.5 (3) MG/3ML IN SOLN
3.0000 mL | Freq: Once | RESPIRATORY_TRACT | Status: AC
  Administered 2018-01-10: 3 mL via RESPIRATORY_TRACT

## 2018-01-10 MED ORDER — AZITHROMYCIN 250 MG PO TABS: ORAL_TABLET | ORAL | 0 refills | Status: DC

## 2018-01-10 MED ORDER — ACETAMINOPHEN 325 MG PO TABS
650.0000 mg | ORAL_TABLET | Freq: Once | ORAL | Status: AC
  Administered 2018-01-10: 650 mg via ORAL

## 2018-01-10 MED ORDER — ALBUTEROL (5 MG/ML) CONTINUOUS INHALATION SOLN
10.0000 mg/h | INHALATION_SOLUTION | Freq: Once | RESPIRATORY_TRACT | Status: AC
  Administered 2018-01-10: 10 mg/h via RESPIRATORY_TRACT
  Filled 2018-01-10: qty 20

## 2018-01-10 MED ORDER — BENZONATATE 100 MG PO CAPS
100.0000 mg | ORAL_CAPSULE | Freq: Once | ORAL | Status: AC
  Administered 2018-01-10: 100 mg via ORAL
  Filled 2018-01-10: qty 1

## 2018-01-10 MED ORDER — PREDNISONE 20 MG PO TABS: ORAL_TABLET | ORAL | 0 refills | Status: DC

## 2018-01-10 MED ORDER — ACETAMINOPHEN 325 MG PO TABS
650.0000 mg | ORAL_TABLET | Freq: Once | ORAL | Status: AC
  Administered 2018-01-10: 650 mg via ORAL
  Filled 2018-01-10: qty 2

## 2018-01-10 MED ORDER — SODIUM CHLORIDE 0.9 % IV BOLUS
1000.0000 mL | Freq: Once | INTRAVENOUS | Status: AC
  Administered 2018-01-10: 1000 mL via INTRAVENOUS

## 2018-01-10 MED ORDER — ALBUTEROL (5 MG/ML) CONTINUOUS INHALATION SOLN: 10.0000 mg/h | INHALATION_SOLUTION | Freq: Once | RESPIRATORY_TRACT | Status: DC

## 2018-01-10 MED ORDER — IOPAMIDOL (ISOVUE-370) INJECTION 76%
INTRAVENOUS | Status: AC
  Filled 2018-01-10: qty 100

## 2018-01-10 MED ORDER — PREDNISONE 20 MG PO TABS
60.0000 mg | ORAL_TABLET | Freq: Once | ORAL | Status: AC
  Administered 2018-01-10: 60 mg via ORAL
  Filled 2018-01-10: qty 3

## 2018-01-10 MED ORDER — MAGNESIUM SULFATE 2 GM/50ML IV SOLN
2.0000 g | Freq: Once | INTRAVENOUS | Status: AC
  Administered 2018-01-11: 2 g via INTRAVENOUS
  Filled 2018-01-10: qty 50

## 2018-01-10 MED ORDER — SODIUM CHLORIDE 0.9 % IV SOLN
500.0000 mg | Freq: Every day | INTRAVENOUS | Status: DC
  Administered 2018-01-11: 500 mg via INTRAVENOUS
  Filled 2018-01-10: qty 500

## 2018-01-10 MED ORDER — IPRATROPIUM-ALBUTEROL 0.5-2.5 (3) MG/3ML IN SOLN
RESPIRATORY_TRACT | Status: AC
  Filled 2018-01-10: qty 3

## 2018-01-10 MED ORDER — ALBUTEROL SULFATE (2.5 MG/3ML) 0.083% IN NEBU
INHALATION_SOLUTION | RESPIRATORY_TRACT | Status: AC
  Filled 2018-01-10: qty 6

## 2018-01-10 MED ORDER — ALBUTEROL SULFATE (2.5 MG/3ML) 0.083% IN NEBU
5.0000 mg | INHALATION_SOLUTION | Freq: Once | RESPIRATORY_TRACT | Status: AC
  Administered 2018-01-10: 5 mg via RESPIRATORY_TRACT

## 2018-01-10 MED ORDER — ACETAMINOPHEN 325 MG PO TABS
ORAL_TABLET | ORAL | Status: AC
  Filled 2018-01-10: qty 2

## 2018-01-10 MED ORDER — IOPAMIDOL (ISOVUE-370) INJECTION 76%
100.0000 mL | Freq: Once | INTRAVENOUS | Status: AC | PRN
  Administered 2018-01-10: 100 mL via INTRAVENOUS

## 2018-01-10 MED ORDER — IPRATROPIUM BROMIDE 0.02 % IN SOLN
0.5000 mg | Freq: Once | RESPIRATORY_TRACT | Status: AC
  Administered 2018-01-10: 0.5 mg via RESPIRATORY_TRACT
  Filled 2018-01-10: qty 2.5

## 2018-01-10 MED ORDER — METHYLPREDNISOLONE SODIUM SUCC 40 MG IJ SOLR
40.0000 mg | Freq: Three times a day (TID) | INTRAMUSCULAR | Status: DC
  Administered 2018-01-11: 40 mg via INTRAVENOUS
  Filled 2018-01-10: qty 1

## 2018-01-10 MED ORDER — BENZONATATE 100 MG PO CAPS: 100.0000 mg | ORAL_CAPSULE | Freq: Three times a day (TID) | ORAL | 0 refills | Status: DC

## 2018-01-10 NOTE — ED Notes (Signed)
Patient ambulated in the hallway and O2 sats dropped to 88%.

## 2018-01-10 NOTE — Discharge Instructions (Signed)
Please report to the ER immediately for evaluation of pulmonary embolism.

## 2018-01-10 NOTE — ED Triage Notes (Addendum)
Pt with a hx of asthma and PE here for cough, wheezing and SOB. Reports that her chest is hurting. She has been using her inhaler without relief.

## 2018-01-10 NOTE — ED Notes (Signed)
Patient transported to CT 

## 2018-01-10 NOTE — ED Triage Notes (Signed)
Pt to ER for evaluation of cough and shortness of breath. States was sent from [facility] due to hx of PE.

## 2018-01-10 NOTE — ED Provider Notes (Addendum)
Kara Haynes care assumed from Kara Haynes, [facility] at 65:26 pm.  27 year old female prior history of PE not on any anticoagulant sent here from urgent care for complaints of cough and shortness of breath.  Patient has history of asthma and uses asthma at least several times on a daily basis.  Since yesterday she reported increased shortness of breath, coughing, wheezing and sinus congestion.  She also reports subjective fever, chills and body aches.  She tried using an inhaler every 30 minutes throughout the night without adequate relief.  She was seen at urgent care and sent here for further evaluation due to prior history of PE.  She denies any leg swelling or calf pain or hemoptysis.  No recent travel.  She is not pregnant.  She does endorse seasonal allergies and felt that that may have worsened her symptoms.  No prior history of intubation or ICU stay. Patient is being treated with IV fluids and albuterol treatments. After her treatments are complete the plan is to reassess the patient and if if heart rate and O2 SAT stable will d/c home.    Physical Exam  BP 111/65   Pulse (!) 123   Temp (!) 101.7 F (38.7 C) (Oral)   Resp (!) 29   LMP 12/14/2017   SpO2 96%  10:40 pm after treatments are complete the patient ambulated and her pulse Ox dropped to 88%. Patient became short of breath. Will start continuous neb treatment.   Expiratory wheezes. Temp has decreased to 99.9. Patient appears uncomfortable. Discussed with Dr. Jeraldine Loots and will call to discuss admission with hospitalist.   Procedures  MDM  I spoke with the hospitalist and he will admit the patient. Discussed with patient plan and she is agreeable.    Kara Haynes, [place] 01/10/18 2350   1:00 am The hospitalist came to evaluate the patient and the patient reports that she is going home. The patient is getting IV Zithromax and Solumedrol. Patient declined the continuous neb treatment.  After meds infuse RN will ambulate the  and check O2 SAT. After medications complete patient ambulated again and O2 SAT stayed at 98%. And HR 105. Patient appears stable at this time. I discussed with the patient in detail need for follow up and to return immediately if symptoms worsen again.    Kara Haynes, [place] 01/11/18 1610    Gerhard Munch, MD 01/11/18 2122

## 2018-01-10 NOTE — ED Provider Notes (Signed)
MRN: [id number] DOB: [birth year]  Subjective:   Kara Haynes is a 27 y.o. female presenting for 2-day history of productive cough, shortness of breath, wheezing.  Cough elicits midsternal chest pain.  She has also had fever and actually admits that about 4 days ago she had a severe allergic reaction to eating fish.  She did take Zyrtec with improvement in her symptoms up until 2 days ago when she developed her severe cough.  Patient has a history of an unprovoked pulmonary embolism as seen on her chest CT from August 09, 2014.  A subsequent chest CT from October 26, 2015 revealed resolution of her PE.  She is not currently taking any anticoagulation medications.  Patient denies any recent surgeries, hospitalizations, long distance travel.  She also has a history of asthma and is supposed to be on Advair Diskus but cannot afford this so she just uses albuterol.  States that she did try 3 nebulizer treatments yesterday without much improvement.  Patient is an active smoker.   Current Facility-Administered Medications:  .  acetaminophen (TYLENOL) tablet 650 mg, 650 mg, Oral, Once, [place] Scrape, Renie Ora, MD  Current Outpatient Medications:  .  albuterol (PROVENTIL HFA;VENTOLIN HFA) 108 (90 Base) MCG/ACT inhaler, Inhale 1-2 puffs into the lungs every 6 (six) hours as needed for wheezing or shortness of breath., Disp: 1 Inhaler, Rfl: 5 .  cetirizine (ZYRTEC) 10 MG tablet, Take 1 tablet (10 mg total) by mouth daily for 7 days., Disp: 7 tablet, Rfl: 0 .  famotidine (PEPCID) 20 MG tablet, Take 1 tablet (20 mg total) by mouth 2 (two) times daily for 7 days., Disp: 14 tablet, Rfl: 0 .  Fluticasone-Salmeterol (ADVAIR DISKUS) 100-50 MCG/DOSE AEPB, Inhale 1 puff into the lungs 2 (two) times daily., Disp: 180 each, Rfl: 3 .  ibuprofen (ADVIL,MOTRIN) 600 MG tablet, Take 1 tablet (600 mg total) by mouth every 6 (six) hours as needed., Disp: 30 tablet, Rfl: 0 .  loratadine (CLARITIN) 10 MG tablet, Take  10 mg by mouth daily., Disp: , Rfl:    Allergies  Allergen Reactions  . Fish Allergy     Past Medical History:  Diagnosis Date  . Asthma   . Obesity   . PE (pulmonary embolism)   . Seasonal allergies   . Tobacco abuse   . UTI (lower urinary tract infection)      History reviewed. No pertinent surgical history.   Family History  Problem Relation Age of Onset  . Other Unknown        denies family h/o clotting d/o  . Asthma Maternal Grandfather   . Hypertension Maternal Grandfather      Objective:   Vitals: Pulse (!) 108   Temp (!) 101.9 F (38.8 C) (Oral)   Resp (!) 22   LMP 12/14/2017   SpO2 94%   Physical Exam  Constitutional: She is oriented to person, place, and time. She appears well-developed and well-nourished.  HENT:  Mouth/Throat: Oropharynx is clear and moist.  Eyes: Pupils are equal, round, and reactive to light. EOM are normal. Right eye exhibits no discharge. Left eye exhibits no discharge. No scleral icterus.  Cardiovascular: Normal rate, regular rhythm and intact distal pulses. Exam reveals no gallop and no friction rub.  No murmur heard. Pulmonary/Chest: Tachypnea noted. No respiratory distress. She has no decreased breath sounds. She has no wheezes. She has no rhonchi. She has rales in the right lower field.  Neurological: She is alert and oriented to person, place, and  time.  Skin: Skin is warm and dry.  Psychiatric: She has a normal mood and affect.   Dg Chest 2 View  Result Date: 01/10/2018 CLINICAL DATA:  chest pain with productive cough, shob, history of chest PE EXAM: CHEST - 2 VIEW COMPARISON:  05/02/2016 FINDINGS: The heart size and mediastinal contours are within normal limits. Mild scarring in medial right lung base remains stable. Stable central peribronchial thickening. No evidence of acute infiltrate or edema. No evidence of pleural effusion. The visualized skeletal structures are unremarkable. IMPRESSION: No active cardiopulmonary  disease. Electronically Signed   By: Myles Rosenthal M.D.   On: 01/10/2018 13:12     Assessment and Plan :   Productive cough  Chest pain, unspecified type  Tachypnea  Shortness of breath  Moderate persistent asthma without complication  Environmental allergies  History of pulmonary embolism  Tobacco use disorder  Recommended patient reports to the ER immediately for rule out of the pulmonary embolism given her history of PE, tachypnea, cough, chest pain, tachycardia and fever.  Patient was given a DuoNeb in clinic with minimal improvement.  She did express concern about weight time in the ER but I counseled that she definitely cannot go home without confirming whether or not she has a chest PE.  Counseled patient on risks of untreated PE including death, patient verbalized understanding.   Wallis Bamberg, PA-C 01/10/18 1321

## 2018-01-10 NOTE — ED Provider Notes (Signed)
[facility] EMERGENCY DEPARTMENT Provider Note   CSN: [id number] Arrival date & time: 01/10/18  1334     History   Chief Complaint Chief Complaint  Patient presents with  . Cough    HPI RHEANNON CERNEY is a 27 y.o. female.  HPI   27 year old female prior history of PE not on any anticoagulant sent here from urgent care for complaints of cough and shortness of breath.  Patient has history of asthma and uses asthma at least several times on a daily basis.  Since yesterday she reported increased shortness of breath, coughing, wheezing and sinus congestion.  She also reports subjective fever, chills and body aches.  She tried using an inhaler every 30 minutes throughout the night without adequate relief.  She was seen at urgent care and sent here for further evaluation due to prior history of PE.  She denies any leg swelling or calf pain or hemoptysis.  No recent travel.  She is not pregnant.  She does endorse seasonal allergies and felt that that may have worsened her symptoms.  No prior history of intubation or ICU stay.  Past Medical History:  Diagnosis Date  . Asthma   . Obesity   . PE (pulmonary embolism)   . Seasonal allergies   . Tobacco abuse   . UTI (lower urinary tract infection)     Patient Active Problem List   Diagnosis Date Noted  . Hepatic steatosis 06/26/2016  . Tobacco abuse 06/26/2016  . AKI (acute kidney injury) (HCC) 01/15/2016  . History of pulmonary embolism 10/26/2015  . Marijuana use 10/26/2015  . Preventative health care 11/22/2014  . Metrorrhagia 10/05/2014  . Abnormal chest CT 08/09/2014  . Severe persistent asthma 08/09/2014    History reviewed. No pertinent surgical history.   OB History   None      Home Medications    Prior to Admission medications   Medication Sig Start Date End Date Taking? Authorizing Provider  albuterol (PROVENTIL HFA;VENTOLIN HFA) 108 (90 Base) MCG/ACT inhaler Inhale 1-2 puffs into  the lungs every 6 (six) hours as needed for wheezing or shortness of breath. 06/26/16   Carolynn Comment, MD  cetirizine (ZYRTEC) 10 MG tablet Take 1 tablet (10 mg total) by mouth daily for 7 days. 01/05/18 01/12/18  Aviva Kluver B, PA-C  famotidine (PEPCID) 20 MG tablet Take 1 tablet (20 mg total) by mouth 2 (two) times daily for 7 days. 01/05/18 01/12/18  Aviva Kluver B, PA-C  Fluticasone-Salmeterol (ADVAIR DISKUS) 100-50 MCG/DOSE AEPB Inhale 1 puff into the lungs 2 (two) times daily. 06/26/16 06/26/17  Carolynn Comment, MD  ibuprofen (ADVIL,MOTRIN) 600 MG tablet Take 1 tablet (600 mg total) by mouth every 6 (six) hours as needed. 05/02/16   Lurene Shadow, PA-C  loratadine (CLARITIN) 10 MG tablet Take 10 mg by mouth daily.    [provider]    Family History Family History  Problem Relation Age of Onset  . Other Unknown        denies family h/o clotting d/o  . Asthma Maternal Grandfather   . Hypertension Maternal Grandfather     Social History Social History   Tobacco Use  . Smoking status: Current Some Day Smoker    Packs/day: 0.20    Types: Cigarettes  . Smokeless tobacco: Never Used  . Tobacco comment: smoking less 5-6  cigarettes aday  Substance Use Topics  . Alcohol use: Yes    Alcohol/week: 0.0 oz    Comment:  occ  . Drug use: Yes    Types: Marijuana     Allergies   Fish allergy   Review of Systems Review of Systems  All other systems reviewed and are negative.    Physical Exam Updated Vital Signs BP 110/75 (BP Location: Left Arm)   Pulse (!) 106   Temp 100.2 F (37.9 C) (Oral)   Resp 18   LMP 12/14/2017   SpO2 97%   Physical Exam  Constitutional: She appears well-developed and well-nourished. No distress.  Patient in mild respiratory discomfort but nontoxic  HENT:  Head: Atraumatic.  Right Ear: External ear normal.  Left Ear: External ear normal.  Nose: Nose normal.  Mouth/Throat: Oropharynx is clear and moist.  Eyes: Conjunctivae are  normal.  Neck: Neck supple. No JVD present. No tracheal deviation present. No thyromegaly present.  Cardiovascular:  Tachycardia, no murmur rubs or gallops  Pulmonary/Chest:  Faint expiratory wheezes, no rales or rhonchi.  Mildly tachypneic  Abdominal: Soft. Bowel sounds are normal. She exhibits no distension. There is no tenderness.  Musculoskeletal: She exhibits no edema.  Lymphadenopathy:    She has no cervical adenopathy.  Neurological: She is alert.  Skin: No rash noted.  Psychiatric: She has a normal mood and affect.  Nursing note and vitals reviewed.    ED Treatments / Results  Labs (all labs ordered are listed, but only abnormal results are displayed) Labs Reviewed  COMPREHENSIVE METABOLIC PANEL - Abnormal; Notable for the following components:      Result Value   Sodium 133 (*)    Chloride 100 (*)    CO2 21 (*)    Glucose, Bld 109 (*)    Calcium 8.5 (*)    AST 13 (*)    ALT 10 (*)    Total Bilirubin 1.5 (*)    All other components within normal limits  CBC WITH DIFFERENTIAL/PLATELET  I-STAT BETA HCG BLOOD, ED ([facility], [facility], [facility] ONLY)  I-STAT TROPONIN, ED    EKG None  ED ECG REPORT   Date: 01/10/2018  Rate: 103  Rhythm: sinus tachycardia  QRS Axis: normal  Intervals: normal  ST/T Wave abnormalities: nonspecific ST changes  Conduction Disutrbances:none  Narrative Interpretation:   Old EKG Reviewed: none available  I have personally reviewed the EKG tracing and agree with the computerized printout as noted.   Radiology Dg Chest 2 View  Result Date: 01/10/2018 CLINICAL DATA:  chest pain with productive cough, shob, history of chest PE EXAM: CHEST - 2 VIEW COMPARISON:  05/02/2016 FINDINGS: The heart size and mediastinal contours are within normal limits. Mild scarring in medial right lung base remains stable. Stable central peribronchial thickening. No evidence of acute infiltrate or edema. No evidence of pleural effusion. The visualized skeletal structures are  unremarkable. IMPRESSION: No active cardiopulmonary disease. Electronically Signed   By: Myles Rosenthal M.D.   On: 01/10/2018 13:12    Procedures Procedures (including critical care time)  Medications Ordered in ED Medications  albuterol (PROVENTIL) (2.5 MG/3ML) 0.083% nebulizer solution (has no administration in time range)  albuterol (PROVENTIL) (2.5 MG/3ML) 0.083% nebulizer solution 5 mg (5 mg Nebulization Given 01/10/18 1358)     Initial Impression / Assessment and Plan / ED Course  I have reviewed the triage vital signs and the nursing notes.  Pertinent labs & imaging results that were available during my care of the patient were reviewed by me and considered in my medical decision making (see chart for details).     BP Marland Kitchen)  142/72 (BP Location: Right Arm)   Pulse (!) 147   Temp 100.2 F (37.9 C) (Oral)   Resp (!) 22   LMP 12/14/2017   SpO2 93%  Tachycardia s/p continuous albuterol neb.  Final Clinical Impressions(s) / ED Diagnoses   Final diagnoses:  Bronchitis  Moderate persistent asthma with exacerbation    ED Discharge Orders        Ordered    predniSONE (DELTASONE) 20 MG tablet     01/10/18 2128    azithromycin (ZITHROMAX Z-PAK) 250 MG tablet     01/10/18 2128    benzonatate (TESSALON) 100 MG capsule  Every 8 hours     01/10/18 2128     5:49 PM Patient here with cold symptoms, shortness of breath and wheezing.  Suspect viral etiology causing an asthma exacerbation.  However, she does have prior history of PE and therefore she will benefit from chest CT angiogram to rule out PE as a cause.  We will also assess for potential pneumonia.  Will provide breathing treatment, steroid, and cough medication.  9:09 PM Chest CT angiogram was limited due to motion and Quantum artifact however no evidence of central, main, or global PE.  There is suggestion of a small filling defect within the peripheral subsegmental right lower lobe pulmonary which is likely secondary to  artifact.  Tiny segmental PE not entirely excluded.  Peripheral groundglass opacity within the left lower lobe and bilateral lower lobe bronchial wall thickening raising the possibility for bronchitis.  No report of pneumonia.  I discussed the finding with patient.  Plan to discharge home with steroid, Z-Pak, and cough medication.  Patient understands to return if her condition worsen as we are unable to fully rule out PE as a potential cause however my suspicion is low.  She is currently tachycardic after receiving albuterol treatment.  She also has a low-grade fever, Tylenol given. IVF given.  Pt sign out to [facility], NP who will f/u on repeat vital sign after IVF and tylenol.     [name], PA-C 01/10/18 2154    Mancel Bale, MD 01/11/18 [phone]

## 2018-01-11 ENCOUNTER — Encounter (HOSPITAL_COMMUNITY): Payer: Self-pay | Admitting: Family Medicine

## 2018-01-11 MED ORDER — BENZONATATE 100 MG PO CAPS: 100.0000 mg | ORAL_CAPSULE | Freq: Three times a day (TID) | ORAL | 0 refills | Status: DC

## 2018-01-11 MED ORDER — PREDNISONE 20 MG PO TABS: ORAL_TABLET | ORAL | 0 refills | Status: DC

## 2018-01-11 MED ORDER — AZITHROMYCIN 250 MG PO TABS: ORAL_TABLET | ORAL | 0 refills | Status: DC

## 2018-01-11 NOTE — ED Notes (Signed)
Patient able to ambulate without difficulty, HR went to 105 and oxygen remained at 98%

## 2018-04-23 ENCOUNTER — Ambulatory Visit (HOSPITAL_COMMUNITY)
Admission: EM | Admit: 2018-04-23 | Discharge: 2018-04-23 | Disposition: A | Discharge status: Home / Self Care | Payer: Self-pay | Attending: Family Medicine | Admitting: Family Medicine

## 2018-04-23 ENCOUNTER — Other Ambulatory Visit: Payer: Self-pay

## 2018-04-23 DIAGNOSIS — J4521 Mild intermittent asthma with (acute) exacerbation: Secondary | ICD-10-CM

## 2018-04-23 DIAGNOSIS — R062 Wheezing: Secondary | ICD-10-CM

## 2018-04-23 DIAGNOSIS — R0602 Shortness of breath: Secondary | ICD-10-CM

## 2018-04-23 MED ORDER — BUDESONIDE-FORMOTEROL FUMARATE 160-4.5 MCG/ACT IN AERO: 2.0000 | INHALATION_SPRAY | Freq: Every day | RESPIRATORY_TRACT | 12 refills | Status: DC

## 2018-04-23 MED ORDER — IPRATROPIUM-ALBUTEROL 0.5-2.5 (3) MG/3ML IN SOLN
3.0000 mL | Freq: Once | RESPIRATORY_TRACT | Status: AC
  Administered 2018-04-23: 3 mL via RESPIRATORY_TRACT

## 2018-04-23 MED ORDER — PREDNISONE 20 MG PO TABS: 20.0000 mg | ORAL_TABLET | Freq: Two times a day (BID) | ORAL | 0 refills | Status: DC

## 2018-04-23 MED ORDER — METHYLPREDNISOLONE SODIUM SUCC 125 MG IJ SOLR
125.0000 mg | Freq: Once | INTRAMUSCULAR | Status: AC
  Administered 2018-04-23: 125 mg via INTRAMUSCULAR

## 2018-04-23 MED ORDER — METHYLPREDNISOLONE SODIUM SUCC 125 MG IJ SOLR
INTRAMUSCULAR | Status: AC
  Filled 2018-04-23: qty 2

## 2018-04-23 MED ORDER — IPRATROPIUM-ALBUTEROL 0.5-2.5 (3) MG/3ML IN SOLN
RESPIRATORY_TRACT | Status: AC
  Filled 2018-04-23: qty 3

## 2018-04-23 NOTE — ED Triage Notes (Signed)
Asthma flare up 2 days

## 2018-04-23 NOTE — ED Provider Notes (Signed)
[facility]    CSN: [id number] Arrival date & time: 04/23/18  1921     History   Chief Complaint Chief Complaint  Patient presents with  . Asthma    HPI Kara Haynes is a 27 y.o. female.   HPI  Patient has long-standing asthma.  She has frequent asthma attacks.  She does not have a PCP.  The only inhaler she has right now is albuterol.  She is been using this several times a day.  In spite of this she still is quite short of breath.  She is here stating she needs a breathing treatment.  She does not have a nebulizer at home.  She previously was on a steroid inhaler.  She no longer uses this.  She has no fever or chills.  Clear mucus.  Cough.  Mild runny stuffy nose.  No chest pain.  No fever chills.  Past Medical History:  Diagnosis Date  . Asthma   . Obesity   . PE (pulmonary embolism)   . Seasonal allergies   . Tobacco abuse   . UTI (lower urinary tract infection)     Patient Active Problem List   Diagnosis Date Noted  . Hepatic steatosis 06/26/2016  . Tobacco abuse 06/26/2016  . AKI (acute kidney injury) (HCC) 01/15/2016  . History of pulmonary embolism 10/26/2015  . Marijuana use 10/26/2015  . Preventative health care 11/22/2014  . Metrorrhagia 10/05/2014  . Abnormal chest CT 08/09/2014  . Asthma exacerbation 08/09/2014    No past surgical history on file.  OB History   None      Home Medications    Prior to Admission medications   Medication Sig Start Date End Date Taking? Authorizing Provider  albuterol (PROVENTIL HFA;VENTOLIN HFA) 108 (90 Base) MCG/ACT inhaler Inhale 1-2 puffs into the lungs every 6 (six) hours as needed for wheezing or shortness of breath. 06/26/16   Carolynn Comment, MD  budesonide-formoterol (SYMBICORT) 160-4.5 MCG/ACT inhaler Inhale 2 puffs into the lungs daily. 04/23/18   Eustace Moore, MD  predniSONE (DELTASONE) 20 MG tablet Take 1 tablet (20 mg total) by mouth 2 (two) times daily with a meal. 04/23/18    Eustace Moore, MD    Family History Family History  Problem Relation Age of Onset  . Other Unknown        denies family h/o clotting d/o  . Asthma Maternal Grandfather   . Hypertension Maternal Grandfather     Social History Social History   Tobacco Use  . Smoking status: Current Some Day Smoker    Packs/day: 0.20    Types: Cigarettes  . Smokeless tobacco: Never Used  . Tobacco comment: smoking less 5-6  cigarettes aday  Substance Use Topics  . Alcohol use: Yes    Alcohol/week: 0.0 standard drinks    Comment: occ  . Drug use: Yes    Types: Marijuana     Allergies   Fish allergy   Review of Systems Review of Systems  Constitutional: Positive for fatigue. Negative for chills and fever.  HENT: Positive for postnasal drip and rhinorrhea. Negative for ear pain and sore throat.   Eyes: Negative for pain and visual disturbance.  Respiratory: Positive for cough, chest tightness, shortness of breath and wheezing.   Cardiovascular: Negative for chest pain and palpitations.  Gastrointestinal: Negative for abdominal pain and vomiting.  Genitourinary: Negative for dysuria and hematuria.  Musculoskeletal: Negative for arthralgias and back pain.  Skin: Negative for color change and  rash.  Neurological: Negative for seizures and syncope.  Psychiatric/Behavioral: Positive for sleep disturbance.  All other systems reviewed and are negative.    Physical Exam Triage Vital Signs ED Triage Vitals  Enc Vitals Group     BP 04/23/18 2010 110/82     Pulse Rate 04/23/18 2007 74     Resp 04/23/18 2007 18     Temp 04/23/18 2007 97.6 F (36.4 C)     Temp Source 04/23/18 2007 Oral     SpO2 04/23/18 2007 99 %     Weight 04/23/18 2012 229 lb (103.9 kg)     Height --    No data found.  Updated Vital Signs BP 110/82   Pulse 74   Temp 97.6 F (36.4 C) (Oral)   Resp 18   Wt 103.9 kg   LMP 04/23/2018   SpO2 99%   BMI 42.57 kg/m      Physical Exam  Constitutional: She  appears well-developed and well-nourished. No distress.  HENT:  Head: Normocephalic and atraumatic.  Right Ear: External ear normal.  Left Ear: External ear normal.  Mouth/Throat: Oropharynx is clear and moist.  Eyes: Pupils are equal, round, and reactive to light. Conjunctivae are normal.  Neck: Normal range of motion.  Cardiovascular: Normal rate, regular rhythm and normal heart sounds.  Pulmonary/Chest: Effort normal. No respiratory distress. She has wheezes.  Wheezes throughout both lung fields with diminished lung exertions  Abdominal: Soft. She exhibits no distension.  Musculoskeletal: Normal range of motion. She exhibits no edema.  Lymphadenopathy:    She has cervical adenopathy.  Neurological: She is alert.  Skin: Skin is warm and dry.  Psychiatric: She has a normal mood and affect. Her behavior is normal.     UC Treatments / Results  Labs (all labs ordered are listed, but only abnormal results are displayed) Labs Reviewed - No data to display  EKG None  Radiology No results found.  Procedures Procedures (including critical care time)  Medications Ordered in UC Medications  ipratropium-albuterol (DUONEB) 0.5-2.5 (3) MG/3ML nebulizer solution 3 mL (3 mLs Nebulization Given 04/23/18 2058)  methylPREDNISolone sodium succinate (SOLU-MEDROL) 125 mg/2 mL injection 125 mg (125 mg Intramuscular Given 04/23/18 2058)    Initial Impression / Assessment and Plan / UC Course  I have reviewed the triage vital signs and the nursing notes.  Pertinent labs & imaging results that were available during my care of the patient were reviewed by me and considered in my medical decision making (see chart for details).     I explained patient that she has a moderate persistent asthma not just an intermittent asthma.  Using albuterol only is not going to be effective treatment for her asthma.  She does not have insurance.  She states she cannot afford PCP right now.  I did go ahead and give  her a written prescription for Symbicort.  I also gave her a form to fill out for the pharmaceutical company so she can get this medication at reduced cost.  She will continue with albuterol as needed. Final Clinical Impressions(s) / UC Diagnoses   Final diagnoses:  Mild intermittent asthma with exacerbation     Discharge Instructions     Prednisone twice a day for 5 days Drink plenty of fluids Use albuterol as needed for wheezing Obtain Symbicort for use daily.  This will help reduce your need for albuterol.    ED Prescriptions    Medication Sig Dispense Auth. Provider   predniSONE (DELTASONE)  20 MG tablet Take 1 tablet (20 mg total) by mouth 2 (two) times daily with a meal. 10 tablet Eustace MooreNelson, Takayla Baillie Sue, MD   budesonide-formoterol [facility](SYMBICORT) 160-4.5 MCG/ACT inhaler  (Status: Discontinued) Inhale 2 puffs into the lungs daily. 1 Inhaler Eustace MooreNelson, Ilisa Hayworth Sue, MD   budesonide-formoterol [facility](SYMBICORT) 160-4.5 MCG/ACT inhaler Inhale 2 puffs into the lungs daily. 1 Inhaler Eustace MooreNelson, Jhovanny Guinta Sue, MD     Controlled Substance Prescriptions [place] Controlled Substance Registry consulted? Not Applicable   Eustace MooreNelson, Jenella Craigie Sue, MD 04/23/18 2135

## 2018-04-23 NOTE — Discharge Instructions (Signed)
Prednisone twice a day for 5 days Drink plenty of fluids Use albuterol as needed for wheezing Obtain Symbicort for use daily.  This will help reduce your need for albuterol.

## 2019-01-22 ENCOUNTER — Ambulatory Visit (HOSPITAL_COMMUNITY)
Admission: EM | Admit: 2019-01-22 | Discharge: 2019-01-22 | Disposition: A | Discharge status: Home / Self Care | Payer: Self-pay | Attending: Family Medicine | Admitting: Family Medicine

## 2019-01-22 ENCOUNTER — Other Ambulatory Visit: Payer: Self-pay

## 2019-01-22 DIAGNOSIS — M549 Dorsalgia, unspecified: Secondary | ICD-10-CM

## 2019-01-22 DIAGNOSIS — M25532 Pain in left wrist: Secondary | ICD-10-CM

## 2019-01-22 MED ORDER — CYCLOBENZAPRINE HCL 5 MG PO TABS: 5.0000 mg | ORAL_TABLET | Freq: Three times a day (TID) | ORAL | 0 refills | Status: DC | PRN

## 2019-01-22 MED ORDER — MELOXICAM 15 MG PO TABS: 15.0000 mg | ORAL_TABLET | Freq: Every day | ORAL | 0 refills | Status: DC
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
## 2019-01-22 NOTE — Discharge Instructions (Signed)
Heat (pad or rice pillow in microwave) over affected area, 10-15 minutes twice daily.   Ice/cold pack over area for 10-15 min twice daily.  OK to take Tylenol 1000 mg (2 extra strength tabs) or 975 mg (3 regular strength tabs) every 6 hours as needed.  Look up some yoga.
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
## 2019-02-01 ENCOUNTER — Encounter (HOSPITAL_COMMUNITY): Payer: Self-pay | Admitting: Emergency Medicine

## 2019-02-01 ENCOUNTER — Ambulatory Visit (HOSPITAL_COMMUNITY)
Admission: EM | Admit: 2019-02-01 | Discharge: 2019-02-01 | Disposition: A | Discharge status: Home / Self Care | Payer: Self-pay | Attending: Family Medicine | Admitting: Family Medicine

## 2019-02-01 ENCOUNTER — Other Ambulatory Visit: Payer: Self-pay

## 2019-02-01 DIAGNOSIS — M25551 Pain in right hip: Secondary | ICD-10-CM
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
## 2019-02-01 NOTE — Discharge Instructions (Signed)
Pick up  the medications that were prescribed and start those today  Follow up as needed for continued or worsening symptoms

## 2019-02-01 NOTE — ED Provider Notes (Signed)
[facility]    CSN: [id number] Arrival date & time: 02/01/19  1306     History   Chief Complaint Chief Complaint  Patient presents with  . Hip Pain    HPI Kara Haynes is a 28 y.o. female.   Pt is a 28 year old female that presents with right hip pain. This has been intermittent, worse with ambulation and doing her job duties. She does heavy lifting at work. She never picked up the medication that was prescribed to her here on the 01/22/19/. She has been taking extra strength tylenol with some relief. Pain is worse when she is at work. They sent her here from work today due to her having pain with duties. No radiation of pain, numbness, tingling. No fever. No saddle paraesthesia, loss of bowel or bladder function.   ROS per HPI       Past Medical History:  Diagnosis Date  . Asthma   . Obesity   . PE (pulmonary embolism)   . Seasonal allergies   . Tobacco abuse   . UTI (lower urinary tract infection)     Patient Active Problem List   Diagnosis Date Noted  . Hepatic steatosis 06/26/2016  . Tobacco abuse 06/26/2016  . AKI (acute kidney injury) (HCC) 01/15/2016  . History of pulmonary embolism 10/26/2015  . Marijuana use 10/26/2015  . Preventative health care 11/22/2014  . Metrorrhagia 10/05/2014  . Abnormal chest CT 08/09/2014  . Asthma exacerbation 08/09/2014    History reviewed. No pertinent surgical history.  OB History   No obstetric history on file.      Home Medications    Prior to Admission medications   Medication Sig Start Date End Date Taking? Authorizing Provider  albuterol (PROVENTIL HFA;VENTOLIN HFA) 108 (90 Base) MCG/ACT inhaler Inhale 1-2 puffs into the lungs every 6 (six) hours as needed for wheezing or shortness of breath. 06/26/16   Carolynn CommentStrelow, Bryan, MD  budesonide-formoterol (SYMBICORT) 160-4.5 MCG/ACT inhaler Inhale 2 puffs into the lungs daily. 04/23/18   Eustace MooreNelson, Yvonne Sue, MD  cyclobenzaprine (FLEXERIL) 5 MG  tablet Take 1 tablet (5 mg total) by mouth 3 (three) times daily as needed for muscle spasms. 01/22/19   Sharlene DoryWendling, Nicholas Paul, DO  meloxicam (MOBIC) 15 MG tablet Take 1 tablet (15 mg total) by mouth daily. 01/22/19   Sharlene DoryWendling, Nicholas Paul, DO    Family History Family History  Problem Relation Age of Onset  . Other Unknown        denies family h/o clotting d/o  . Asthma Maternal Grandfather   . Hypertension Maternal Grandfather     Social History Social History   Tobacco Use  . Smoking status: Current Some Day Smoker    Packs/day: 0.20    Types: Cigarettes  . Smokeless tobacco: Never Used  . Tobacco comment: smoking less 5-6  cigarettes aday  Substance Use Topics  . Alcohol use: Yes    Alcohol/week: 0.0 standard drinks    Comment: occ  . Drug use: Yes    Types: Marijuana     Allergies   Fish allergy   Review of Systems Review of Systems   Physical Exam Triage Vital Signs ED Triage Vitals [02/01/19 1342]  Enc Vitals Group     BP (!) 142/91     Pulse Rate 78     Resp 18     Temp 98.4 F (36.9 C)     Temp Source Oral     SpO2 97 %  Weight      Height      Head Circumference      Peak Flow      Pain Score 8     Pain Loc      Pain Edu?      Excl. in GC?    No data found.  Updated Vital Signs BP (!) 142/91 (BP Location: Right Arm)   Pulse 78   Temp 98.4 F (36.9 C) (Oral)   Resp 18   LMP 01/03/2019   SpO2 97%   Visual Acuity Right Eye Distance:   Left Eye Distance:   Bilateral Distance:    Right Eye Near:   Left Eye Near:    Bilateral Near:     Physical Exam Vitals signs and nursing note reviewed.  Constitutional:      General: She is not in acute distress.    Appearance: Normal appearance. She is not ill-appearing, toxic-appearing or diaphoretic.  HENT:     Head: Normocephalic and atraumatic.     Nose: Nose normal.  Eyes:     Conjunctiva/sclera: Conjunctivae normal.  Pulmonary:     Effort: Pulmonary effort is normal.   Musculoskeletal: Normal range of motion.        General: No swelling.     Comments: No bony hip tenderness Pt ambulating in the room without difficulty.  Tenderness to deep palpation of the hip flexor muscles. No crepitus, swelling, deformity.   Skin:    General: Skin is warm.  Neurological:     Mental Status: She is alert.  Psychiatric:        Mood and Affect: Mood normal.      UC Treatments / Results  Labs (all labs ordered are listed, but only abnormal results are displayed) Labs Reviewed - No data to display  EKG None  Radiology No results found.  Procedures Procedures (including critical care time)  Medications Ordered in UC Medications - No data to display  Initial Impression / Assessment and Plan / UC Course  I have reviewed the triage vital signs and the nursing notes.  Pertinent labs & imaging results that were available during my care of the patient were reviewed by me and considered in my medical decision making (see chart for details).      Hip pain  No worrisome findings on exam.  Most likely muscle strain Filled out paperwork patient needed for light duty at work. Recommended picking up the prescriptions that were sent and and taking those. Light duty for 3 days.  After that she may return to normal duty. Follow up as needed for continued or worsening symptoms  Final Clinical Impressions(s) / UC Diagnoses   Final diagnoses:  Pain of right hip joint     Discharge Instructions     Pick up  the medications that were prescribed and start those today  Follow up as needed for continued or worsening symptoms      ED Prescriptions    None     Controlled Substance Prescriptions  Controlled Substance Registry consulted? Not Applicable   Janace Aris, NP 02/01/19 1435

## 2019-02-12 ENCOUNTER — Ambulatory Visit (HOSPITAL_COMMUNITY)
Admission: EM | Admit: 2019-02-12 | Discharge: 2019-02-12 | Disposition: A | Discharge status: Home / Self Care | Payer: Self-pay | Attending: Family Medicine | Admitting: Family Medicine

## 2019-02-12 ENCOUNTER — Encounter (HOSPITAL_COMMUNITY): Payer: Self-pay

## 2019-02-12 ENCOUNTER — Other Ambulatory Visit: Payer: Self-pay

## 2019-02-12 DIAGNOSIS — M722 Plantar fascial fibromatosis: Secondary | ICD-10-CM

## 2019-02-12 MED ORDER — PREDNISONE 50 MG PO TABS: ORAL_TABLET | ORAL | 1 refills | Status: DC

## 2019-02-12 NOTE — Discharge Instructions (Addendum)
Try to find a shoe insert that takes the pressure off the middle of the heel

## 2019-02-12 NOTE — ED Triage Notes (Signed)
Pt describes plantar pain in left foot that began 1 week ago
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
## 2019-02-18 ENCOUNTER — Encounter (HOSPITAL_COMMUNITY): Payer: Self-pay

## 2019-02-18 ENCOUNTER — Ambulatory Visit (HOSPITAL_COMMUNITY)
Admission: EM | Admit: 2019-02-18 | Discharge: 2019-02-18 | Disposition: A | Discharge status: Home / Self Care | Payer: Self-pay | Attending: Family Medicine | Admitting: Family Medicine

## 2019-02-18 ENCOUNTER — Other Ambulatory Visit: Payer: Self-pay

## 2019-02-18 DIAGNOSIS — N3090 Cystitis, unspecified without hematuria: Secondary | ICD-10-CM | POA: Insufficient documentation

## 2019-02-18 DIAGNOSIS — B9689 Other specified bacterial agents as the cause of diseases classified elsewhere: Secondary | ICD-10-CM

## 2019-02-18 DIAGNOSIS — N760 Acute vaginitis: Secondary | ICD-10-CM | POA: Insufficient documentation

## 2019-02-18 LAB — POCT URINALYSIS DIP (DEVICE)
Glucose, UA: NEGATIVE mg/dL
Ketones, ur: NEGATIVE mg/dL
Nitrite: NEGATIVE
Protein, ur: 30 mg/dL — AB
Specific Gravity, Urine: >=1.03 (ref 1.005–1.030)
Urobilinogen, UA: 0.2 mg/dL (ref 0.0–1.0)
pH: 5.5 (ref 5.0–8.0)

## 2019-02-18 LAB — POCT PREGNANCY, URINE: Preg Test, Ur: NEGATIVE

## 2019-02-18 MED ORDER — CEPHALEXIN 500 MG PO CAPS: 500.0000 mg | ORAL_CAPSULE | Freq: Two times a day (BID) | ORAL | 0 refills | Status: DC

## 2019-02-18 MED ORDER — FLUCONAZOLE 150 MG PO TABS: 150.0000 mg | ORAL_TABLET | Freq: Once | ORAL | 0 refills | Status: AC

## 2019-02-18 NOTE — ED Triage Notes (Signed)
Patient presents to Urgent Care with complaints of vaginal itching, occasional yellow vaginal discharge since 3 days ago. Patient reports she has not had unprotected intercourse, thinks she has a UTI.

## 2019-02-18 NOTE — ED Provider Notes (Addendum)
[facility]    CSN: [id number] Arrival date & time: 02/18/19  7366     History   Chief Complaint Chief Complaint  Patient presents with  . Urinary Tract Infection    HPI Kara Haynes is a 28 y.o. female.   28 yo female with a c/o odor with urination, slight discomfort with urination as well as vaginal discharge and vaginal itching. States she's not concerned about STDs. Denies any fevers or pelvic pain.      Past Medical History:  Diagnosis Date  . Asthma   . Obesity   . PE (pulmonary embolism)   . Seasonal allergies   . Tobacco abuse   . UTI (lower urinary tract infection)     Patient Active Problem List   Diagnosis Date Noted  . Hepatic steatosis 06/26/2016  . Tobacco abuse 06/26/2016  . AKI (acute kidney injury) (HCC) 01/15/2016  . History of pulmonary embolism 10/26/2015  . Marijuana use 10/26/2015  . Preventative health care 11/22/2014  . Metrorrhagia 10/05/2014  . Abnormal chest CT 08/09/2014  . Asthma exacerbation 08/09/2014    History reviewed. No pertinent surgical history.  OB History   No obstetric history on file.      Home Medications    Prior to Admission medications   Medication Sig Start Date End Date Taking? Authorizing Provider  albuterol (PROVENTIL HFA;VENTOLIN HFA) 108 (90 Base) MCG/ACT inhaler Inhale 1-2 puffs into the lungs every 6 (six) hours as needed for wheezing or shortness of breath. 06/26/16   Carolynn Comment, MD  budesonide-formoterol (SYMBICORT) 160-4.5 MCG/ACT inhaler Inhale 2 puffs into the lungs daily. 04/23/18   Eustace Moore, MD  cephALEXin (KEFLEX) 500 MG capsule Take 1 capsule (500 mg total) by mouth 2 (two) times daily. 02/18/19   Payton Mccallum, MD  fluconazole (DIFLUCAN) 150 MG tablet Take 1 tablet (150 mg total) by mouth once for 1 dose. 02/18/19 02/18/19  Payton Mccallum, MD  predniSONE (DELTASONE) 50 MG tablet One daily with food 02/12/19   Elvina Sidle, MD    Family History Family  History  Problem Relation Age of Onset  . Other Other        denies family h/o clotting d/o  . Asthma Maternal Grandfather   . Hypertension Maternal Grandfather   . Healthy Mother   . Healthy Father     Social History Social History   Tobacco Use  . Smoking status: Current Some Day Smoker    Packs/day: 0.20    Types: Cigarettes  . Smokeless tobacco: Never Used  . Tobacco comment: smoking less 5-6  cigarettes aday  Substance Use Topics  . Alcohol use: Yes    Alcohol/week: 0.0 standard drinks    Comment: occ  . Drug use: Yes    Types: Marijuana     Allergies   Fish allergy   Review of Systems Review of Systems   Physical Exam Triage Vital Signs ED Triage Vitals  Enc Vitals Group     BP 02/18/19 1009 114/77     Pulse Rate 02/18/19 1009 80     Resp 02/18/19 1009 18     Temp 02/18/19 1009 98.4 F (36.9 C)     Temp Source 02/18/19 1009 Oral     SpO2 02/18/19 1009 97 %     Weight --      Height --      Head Circumference --      Peak Flow --      Pain Score 02/18/19  1007 0     Pain Loc --      Pain Edu? --      Excl. in GC? --    No data found.  Updated Vital Signs BP 114/77 (BP Location: Left Arm)   Pulse 80   Temp 98.4 F (36.9 C) (Oral)   Resp 18   SpO2 97%   Visual Acuity Right Eye Distance:   Left Eye Distance:   Bilateral Distance:    Right Eye Near:   Left Eye Near:    Bilateral Near:     Physical Exam Vitals signs and nursing note reviewed.  Constitutional:      General: She is not in acute distress.    Appearance: She is well-developed. She is not diaphoretic.  Abdominal:     General: There is no distension.     Palpations: Abdomen is soft.  Neurological:     Mental Status: She is alert.      UC Treatments / Results  Labs (all labs ordered are listed, but only abnormal results are displayed) Labs Reviewed  POCT URINALYSIS DIP (DEVICE) - Abnormal; Notable for the following components:      Result Value   Bilirubin Urine  SMALL (*)    Hgb urine dipstick SMALL (*)    Protein, ur 30 (*)    Leukocytes,Ua TRACE (*)    All other components within normal limits  POC URINE PREG, ED  POCT PREGNANCY, URINE  CERVICOVAGINAL ANCILLARY ONLY    EKG None  Radiology No results found.  Procedures Procedures (including critical care time)  Medications Ordered in UC Medications - No data to display  Initial Impression / Assessment and Plan / UC Course  I have reviewed the triage vital signs and the nursing notes.  Pertinent labs & imaging results that were available during my care of the patient were reviewed by me and considered in my medical decision making (see chart for details).      Final Clinical Impressions(s) / UC Diagnoses   Final diagnoses:  Cystitis  Acute vaginitis    ED Prescriptions    Medication Sig Dispense Auth. Provider   cephALEXin (KEFLEX) 500 MG capsule Take 1 capsule (500 mg total) by mouth 2 (two) times daily. 14 capsule Payton Mccallumonty, Delvonte Berenson, MD   fluconazole (DIFLUCAN) 150 MG tablet Take 1 tablet (150 mg total) by mouth once for 1 dose. 1 tablet Payton Mccallumonty, Cesilia Shinn, MD      1. Lab results and diagnosis reviewed with patient; other labs still pending 2. rx as per orders above; reviewed possible side effects, interactions, risks and benefits  3. Recommend supportive treatment with increased water intake 4. Follow-up prn if symptoms worsen or don't improve  Controlled Substance Prescriptions  Controlled Substance Registry consulted? Not Applicable   Payton Mccallumonty, Maddisyn Hegwood, MD 02/18/19 1155    Payton Mccallumonty, Terah Robey, MD 02/18/19 1155

## 2019-02-21 LAB — CERVICOVAGINAL ANCILLARY ONLY
Bacterial vaginitis: NEGATIVE
Candida vaginitis: NEGATIVE
Chlamydia: NEGATIVE
Neisseria Gonorrhea: NEGATIVE
Trichomonas: POSITIVE — AB

## 2019-02-22 ENCOUNTER — Telehealth (HOSPITAL_COMMUNITY): Payer: Self-pay | Admitting: Emergency Medicine

## 2019-02-22 MED ORDER — METRONIDAZOLE 500 MG PO TABS: 2000.0000 mg | ORAL_TABLET | Freq: Once | ORAL | 0 refills | Status: AC

## 2019-02-22 NOTE — Telephone Encounter (Signed)
Trichomonas is positive. Rx  for Flagyl 2 grams, once was sent to the pharmacy of record. Pt needs education to refrain from sexual intercourse for 7 days to give the medicine time to work. Sexual partners need to be notified and tested/treated. Condoms may reduce risk of reinfection. Recheck for further evaluation if symptoms are not improving.   Attempted to reach patient. No answer at this time. Call cannot be completed.    

## 2019-02-23 ENCOUNTER — Telehealth (HOSPITAL_COMMUNITY): Payer: Self-pay | Admitting: Emergency Medicine

## 2019-02-23 NOTE — Telephone Encounter (Signed)
Attempted to reach patient x2. No answer at this time. Call cannot be completed.   

## 2019-03-02 IMAGING — CT CT ANGIO CHEST
2 of 6 series · 18 of 36 positions shown · IV contrast (Omni 300)
Comparison: Chest radiograph 01/10/2018; CT a chest 10/26/2015.

CLINICAL DATA: Patient with productive cough and shortness of
breath. Mid sternal chest pain. History of prior pulmonary embolus.

EXAM:
CT ANGIOGRAPHY CHEST WITH CONTRAST
TECHNIQUE: Multidetector CT imaging of the chest was performed using the
standard protocol during bolus administration of intravenous
contrast. Multiplanar CT image reconstructions and MIPs were
obtained to evaluate the vascular anatomy.
CONTRAST:  59 cc Isovue 370

[Series 8: pe thins · axial · 0.73mm/px · z∈[+1044,+1236]mm · 17 of 218 slices shown]
[im 13/218  lung]
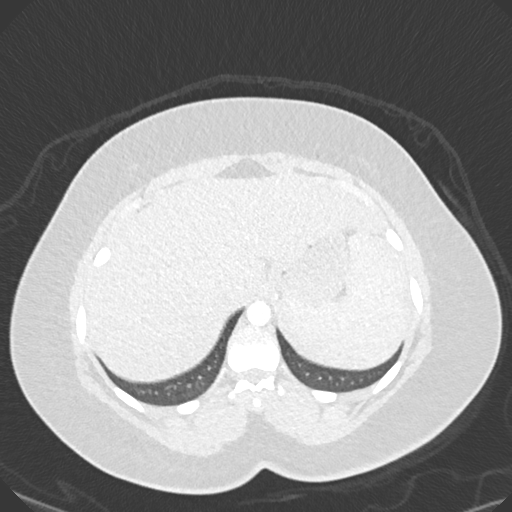
[im 25/218  mediastinal]
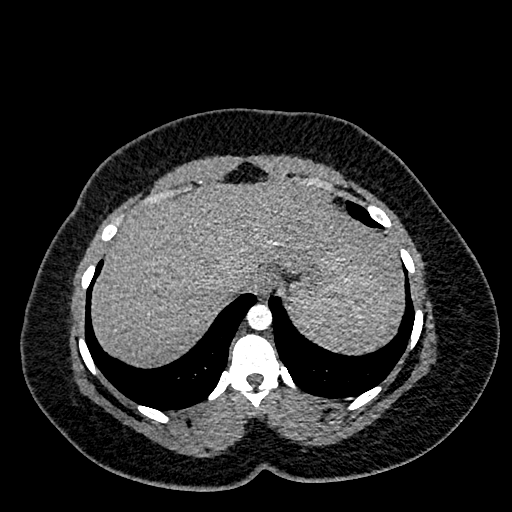
[im 37/218  lung]
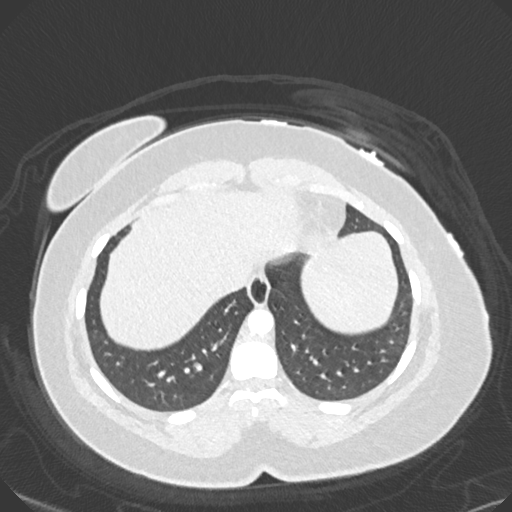
[im 49/218  mediastinal]
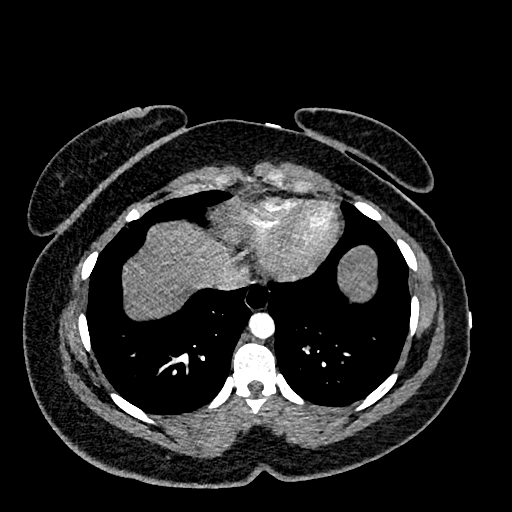
[im 61/218  lung]
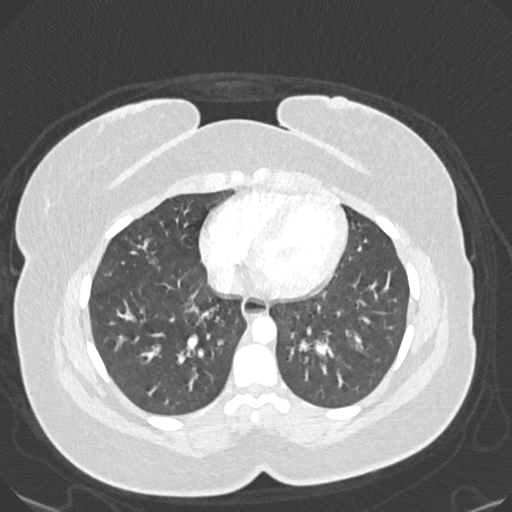
[im 73/218  mediastinal]
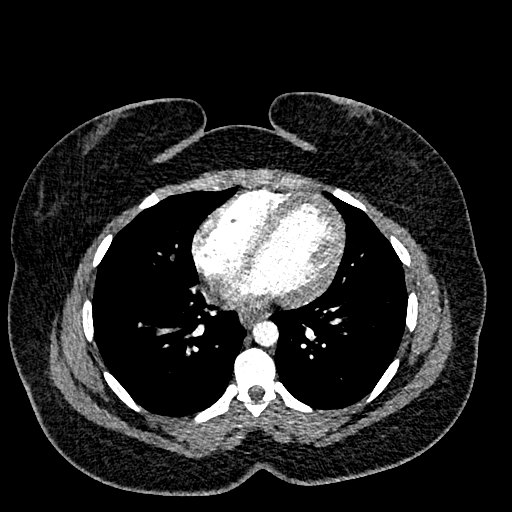
[im 85/218  lung]
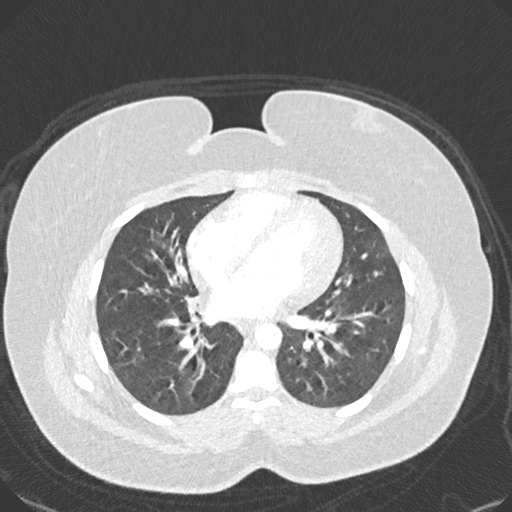
[im 97/218  mediastinal]
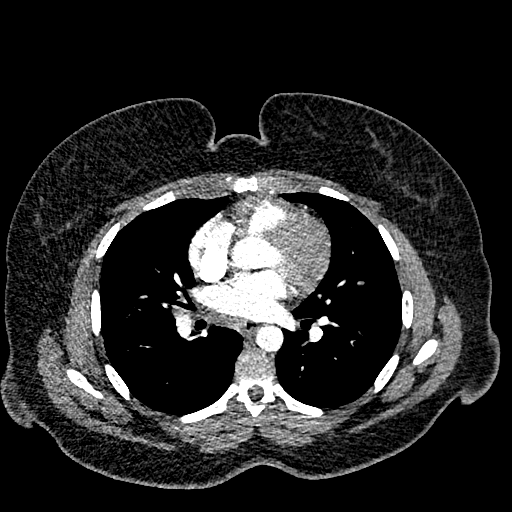
[im 109/218  lung]
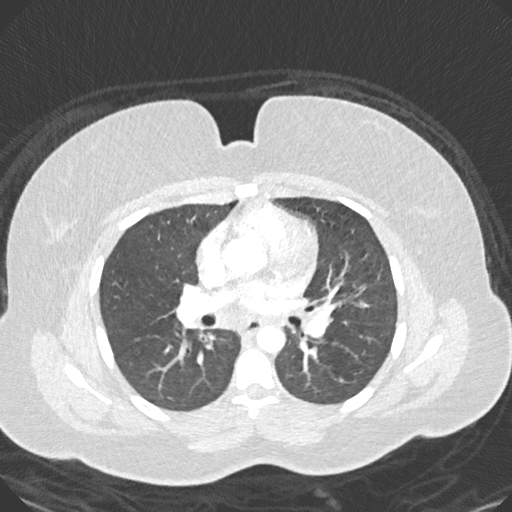
[im 121/218  mediastinal]
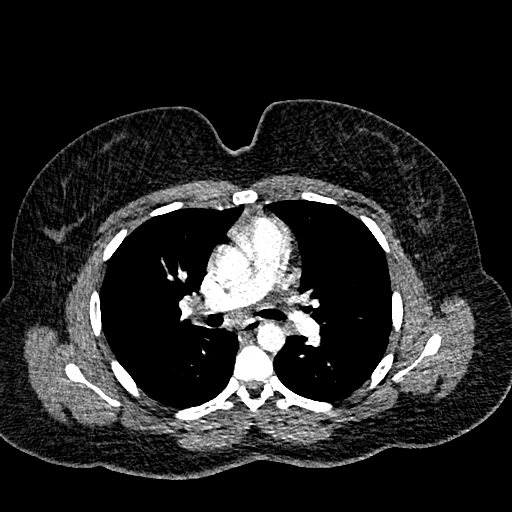
[im 133/218  lung]
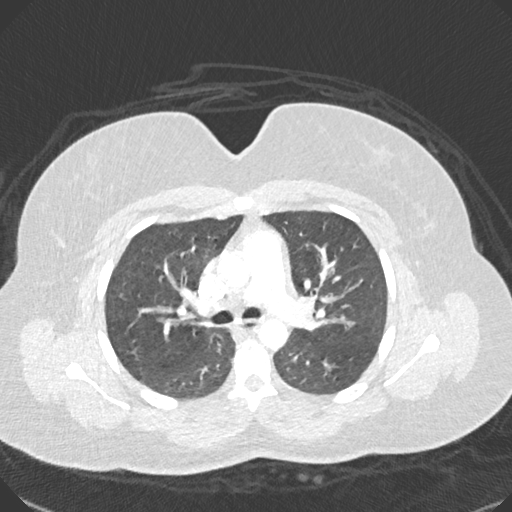
[im 145/218  mediastinal]
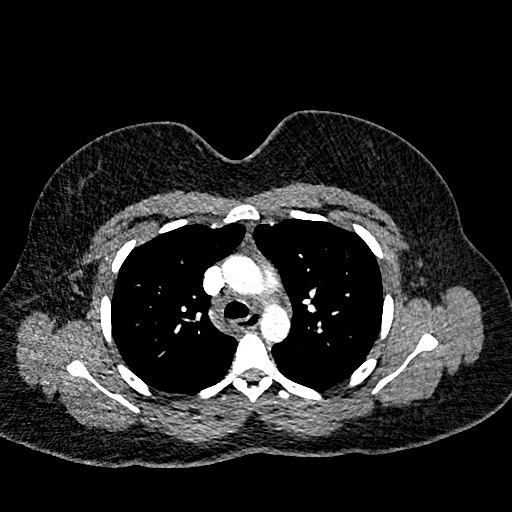
[im 157/218  lung]
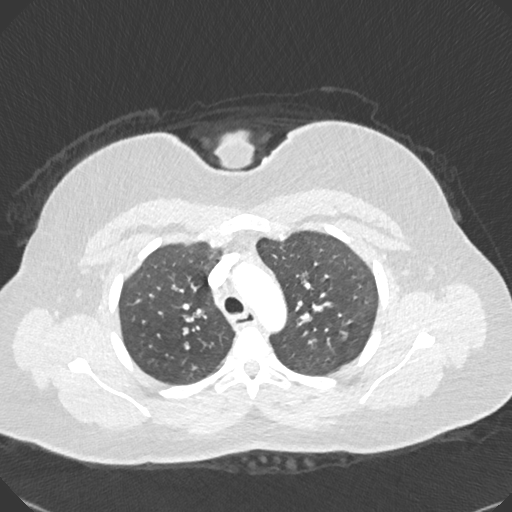
[im 169/218  mediastinal]
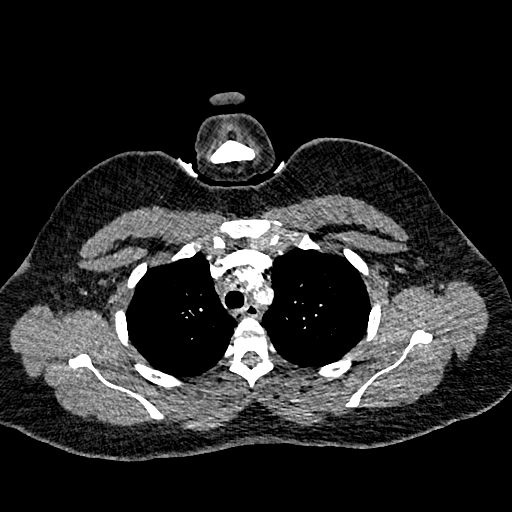
[im 181/218  lung]
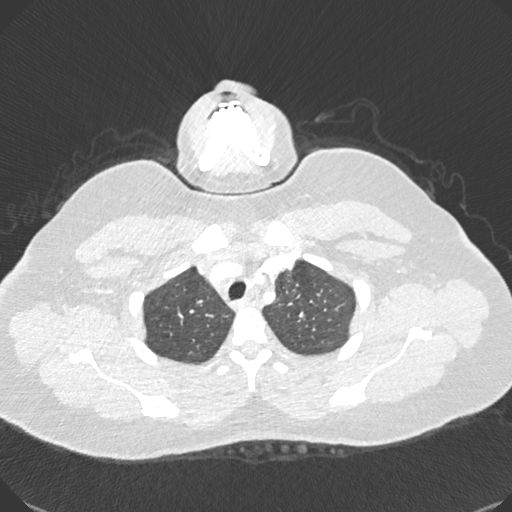
[im 193/218  mediastinal]
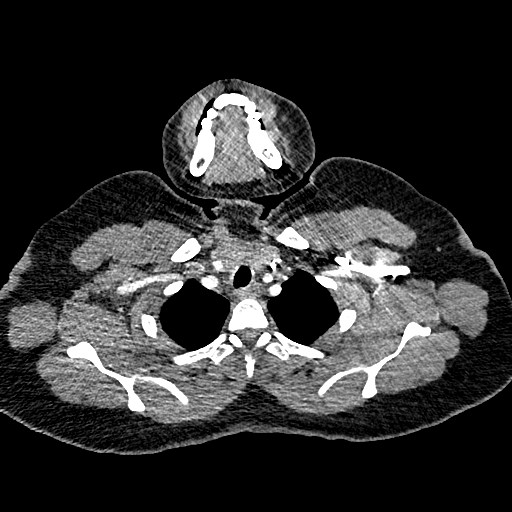
[im 205/218  lung]
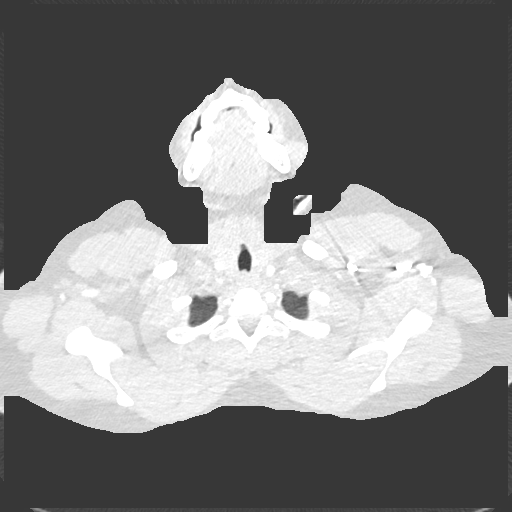

[Series 9: pe 2mm cor · coronal · 0.46mm/px · 1 of 151 slices shown]
[im 76/151  mediastinal]
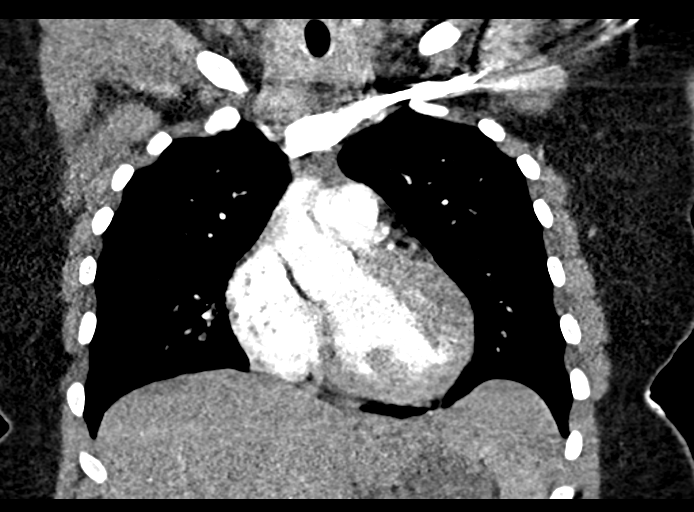

[18 of 36 positions shown; findings below may reference images not displayed]

FINDINGS: Cardiovascular: Normal heart size. No pericardial effusion.
Evaluation for pulmonary embolus limited secondary to bolus, motion
and quantum mottle artifact. No evidence for central, main or lobar
pulmonary embolus. Within a subsegmental right lower lobe pulmonary
artery (image 137; series 8) there is suggestion of a small filling
defect.

Mediastinum/Nodes: No enlarged axillary, mediastinal or hilar
lymphadenopathy. Prominent subcentimeter mediastinal lymph nodes are
demonstrated.

Lungs/Pleura: Central airways are patent. Patchy ground-glass
opacities within the left lower lobe (image 84; series 7). Mild
bilateral lower lobe bronchial wall thickening. Tree-in-bud
nodularity peripheral right middle lobe (image 166; series 8). No
pleural effusion or pneumothorax.

Upper Abdomen: No acute process.

Musculoskeletal: No aggressive or acute appearing osseous lesions.

Review of the MIP images confirms the above findings.
IMPRESSION: 1. Evaluation limited secondary to motion and quantum mottle
artifact. No evidence for central, main or lobar pulmonary embolus.
There is suggestion of a small filling defect within a peripheral
subsegmental right lower lobe pulmonary artery which is likely
secondary to artifact (motion/quantum model). Tiny segmental
pulmonary embolus not entirely excluded.
2. Peripheral ground-glass opacities within the left lower lobe.
Additionally there is bilateral lower lobe bronchial wall thickening
raising the possibility of bronchitis.
3. Prominent mediastinal lymph nodes likely reactive in etiology.

## 2019-03-07 ENCOUNTER — Telehealth (HOSPITAL_COMMUNITY): Payer: Self-pay | Admitting: Emergency Medicine

## 2019-03-07 MED ORDER — METRONIDAZOLE 500 MG PO TABS: 2000.0000 mg | ORAL_TABLET | Freq: Once | ORAL | 0 refills | Status: AC

## 2019-03-07 NOTE — Telephone Encounter (Signed)
Pt received letter, gave her test results, resent script to pharmacy. All questions answered.

## 2019-05-27 ENCOUNTER — Ambulatory Visit (HOSPITAL_COMMUNITY)
Admission: EM | Admit: 2019-05-27 | Discharge: 2019-05-27 | Disposition: A | Discharge status: Home / Self Care | Payer: Self-pay | Attending: Emergency Medicine | Admitting: Emergency Medicine

## 2019-05-27 ENCOUNTER — Other Ambulatory Visit: Payer: Self-pay

## 2019-05-27 ENCOUNTER — Encounter (HOSPITAL_COMMUNITY): Payer: Self-pay

## 2019-05-27 DIAGNOSIS — Z3202 Encounter for pregnancy test, result negative: Secondary | ICD-10-CM

## 2019-05-27 DIAGNOSIS — R1010 Upper abdominal pain, unspecified: Secondary | ICD-10-CM | POA: Insufficient documentation

## 2019-05-27 LAB — CBC WITH DIFFERENTIAL/PLATELET
Abs Immature Granulocytes: 0.02 10*3/uL (ref 0.00–0.07)
Basophils Absolute: 0 10*3/uL (ref 0.0–0.1)
Basophils Relative: 1 %
Eosinophils Absolute: 0.2 10*3/uL (ref 0.0–0.5)
Eosinophils Relative: 4 %
HCT: 41.8 % (ref 36.0–46.0)
Hemoglobin: 14.6 g/dL (ref 12.0–15.0)
Immature Granulocytes: 0 %
Lymphocytes Relative: 32 %
Lymphs Abs: 1.6 10*3/uL (ref 0.7–4.0)
MCH: 33.7 pg (ref 26.0–34.0)
MCHC: 34.9 g/dL (ref 30.0–36.0)
MCV: 96.5 fL (ref 80.0–100.0)
Monocytes Absolute: 0.5 10*3/uL (ref 0.1–1.0)
Monocytes Relative: 9 %
Neutro Abs: 2.7 10*3/uL (ref 1.7–7.7)
Neutrophils Relative %: 54 %
Platelets: 259 10*3/uL (ref 150–400)
RBC: 4.33 MIL/uL (ref 3.87–5.11)
RDW: 15 % (ref 11.5–15.5)
WBC: 5 10*3/uL (ref 4.0–10.5)
nRBC: 0 % (ref 0.0–0.2)

## 2019-05-27 LAB — COMPREHENSIVE METABOLIC PANEL
ALT: 30 U/L (ref 0–44)
AST: 28 U/L (ref 15–41)
Albumin: 3.6 g/dL (ref 3.5–5.0)
Alkaline Phosphatase: 66 U/L (ref 38–126)
Anion gap: 9 (ref 5–15)
BUN: <5 mg/dL — ABNORMAL LOW (ref 6–20)
CO2: 27 mmol/L (ref 22–32)
Calcium: 9 mg/dL (ref 8.9–10.3)
Chloride: 102 mmol/L (ref 98–111)
Creatinine, Ser: 0.92 mg/dL (ref 0.44–1.00)
GFR calc Af Amer: >60 mL/min (ref >60)
GFR calc non Af Amer: >60 mL/min (ref >60)
Glucose, Bld: 98 mg/dL (ref 70–99)
Potassium: 3.6 mmol/L (ref 3.5–5.1)
Sodium: 138 mmol/L (ref 135–145)
Total Bilirubin: 1.9 mg/dL — ABNORMAL HIGH (ref 0.3–1.2)
Total Protein: 6.9 g/dL (ref 6.5–8.1)

## 2019-05-27 LAB — LIPASE, BLOOD: Lipase: 22 U/L (ref 11–51)

## 2019-05-27 LAB — POCT URINALYSIS DIP (DEVICE)
Glucose, UA: NEGATIVE mg/dL
Leukocytes,Ua: NEGATIVE
Nitrite: NEGATIVE
Protein, ur: 30 mg/dL — AB
Specific Gravity, Urine: >=1.03 (ref 1.005–1.030)
Urobilinogen, UA: 1 mg/dL (ref 0.0–1.0)
pH: 5.5 (ref 5.0–8.0)

## 2019-05-27 LAB — POCT PREGNANCY, URINE: Preg Test, Ur: NEGATIVE

## 2019-05-27 MED ORDER — ALUM & MAG HYDROXIDE-SIMETH 200-200-20 MG/5ML PO SUSP
ORAL | Status: AC
  Filled 2019-05-27: qty 30

## 2019-05-27 MED ORDER — OMEPRAZOLE 20 MG PO CPDR: 20.0000 mg | DELAYED_RELEASE_CAPSULE | Freq: Every day | ORAL | 0 refills | Status: DC

## 2019-05-27 MED ORDER — LIDOCAINE VISCOUS HCL 2 % MT SOLN
15.0000 mL | Freq: Once | OROMUCOSAL | Status: AC
  Administered 2019-05-27: 15 mL via ORAL

## 2019-05-27 MED ORDER — LIDOCAINE VISCOUS HCL 2 % MT SOLN
OROMUCOSAL | Status: AC
  Filled 2019-05-27: qty 15

## 2019-05-27 MED ORDER — ALUM & MAG HYDROXIDE-SIMETH 200-200-20 MG/5ML PO SUSP
30.0000 mL | Freq: Once | ORAL | Status: AC
  Administered 2019-05-27: 14:00:00 30 mL via ORAL

## 2019-05-27 MED ORDER — POLYETHYLENE GLYCOL 3350 17 GM/SCOOP PO POWD: 17.0000 g | Freq: Every day | ORAL | 0 refills | Status: DC | PRN

## 2019-05-27 NOTE — ED Triage Notes (Signed)
Patient presents to Urgent Care with complaints of right sided abdominal pain since 3 days ago. Patient reports she did have a BM yesterday, is concerned with her appendix. Pt states the pain is worse after eating.

## 2019-05-27 NOTE — Discharge Instructions (Signed)
We will draw some labs to evaluate your liver/gallbladder.  Will notify you of any positive findings and if any changes to treatment are needed. If you have any concerning findings you will likely need to go to the ER for imaging and further workup.  Low fat diet.  Daily omeprazole.  Miralax as needed for constipation.  Any worsening of symptoms please go to the ER: increased pain, fever, vomiting, blood in your stool.

## 2019-05-28 NOTE — ED Provider Notes (Signed)
[facility]    CSN: [id number] Arrival date & time: 05/27/19  1225      History   Chief Complaint Chief Complaint  Patient presents with  . Abdominal Pain    HPI ALAYA KRESSIN is a 28 y.o. female.   Myiesha J Wright-Madkins presents with complaints of RUQ abdominal pain for the past three days. Worse with eating. She has been constipated although did pass some stool. Pain is currently improved, 4/10. Eating and drinking worsens the pain. Sometimes activity worsens it. No fevers. No nausea or vomiting. Has tried tylenol which hasn't helped. Took pepto bismol as well which didn't help. No blood in stool. Feels like she was able to pass a BM after eating chocolate. No lower abdominal pain. Denies any previous similar. No previous abdominal surgeries. Without contributing medical history.  History  Of asthma, obesity, PE.    ROS per HPI, negative if not otherwise mentioned.      Past Medical History:  Diagnosis Date  . Asthma   . Obesity   . PE (pulmonary embolism)   . Seasonal allergies   . Tobacco abuse   . UTI (lower urinary tract infection)     Patient Active Problem List   Diagnosis Date Noted  . Hepatic steatosis 06/26/2016  . Tobacco abuse 06/26/2016  . AKI (acute kidney injury) (HCC) 01/15/2016  . History of pulmonary embolism 10/26/2015  . Marijuana use 10/26/2015  . Preventative health care 11/22/2014  . Metrorrhagia 10/05/2014  . Abnormal chest CT 08/09/2014  . Asthma exacerbation 08/09/2014    History reviewed. No pertinent surgical history.  OB History   No obstetric history on file.      Home Medications    Prior to Admission medications   Medication Sig Start Date End Date Taking? Authorizing Provider  albuterol (PROVENTIL HFA;VENTOLIN HFA) 108 (90 Base) MCG/ACT inhaler Inhale 1-2 puffs into the lungs every 6 (six) hours as needed for wheezing or shortness of breath. 06/26/16  Yes Carolynn Comment, MD   budesonide-formoterol [facility]) 160-4.5 MCG/ACT inhaler Inhale 2 puffs into the lungs daily. 04/23/18   Eustace Moore, MD  cephALEXin (KEFLEX) 500 MG capsule Take 1 capsule (500 mg total) by mouth 2 (two) times daily. 02/18/19   Payton Mccallum, MD  omeprazole (PRILOSEC) 20 MG capsule Take 1 capsule (20 mg total) by mouth daily. 05/27/19   Georgetta Haber, NP  polyethylene glycol powder (GLYCOLAX/MIRALAX) 17 GM/SCOOP powder Take 17 g by mouth daily as needed for mild constipation or moderate constipation. 05/27/19   Georgetta Haber, NP  predniSONE (DELTASONE) 50 MG tablet One daily with food 02/12/19   Elvina Sidle, MD    Family History Family History  Problem Relation Age of Onset  . Other Other        denies family h/o clotting d/o  . Asthma Maternal Grandfather   . Hypertension Maternal Grandfather   . Healthy Mother   . Healthy Father     Social History Social History   Tobacco Use  . Smoking status: Current Some Day Smoker    Packs/day: 0.20    Types: Cigarettes  . Smokeless tobacco: Never Used  . Tobacco comment: smoking less 5-6  cigarettes aday  Substance Use Topics  . Alcohol use: Yes    Alcohol/week: 0.0 standard drinks    Comment: occ  . Drug use: Yes    Types: Marijuana     Allergies   Fish allergy   Review of Systems Review of  Systems   Physical Exam Triage Vital Signs ED Triage Vitals  Enc Vitals Group     BP 05/27/19 1310 111/76     Pulse Rate 05/27/19 1310 76     Resp 05/27/19 1310 16     Temp 05/27/19 1310 98.6 F (37 C)     Temp Source 05/27/19 1310 Oral     SpO2 05/27/19 1310 100 %     Weight --      Height --      Head Circumference --      Peak Flow --      Pain Score 05/27/19 1308 5     Pain Loc --      Pain Edu? --      Excl. in [place]? --    No data found.  Updated Vital Signs BP 111/76 (BP Location: Left Arm)   Pulse 76   Temp 98.6 F (37 C) (Oral)   Resp 16   SpO2 100%   Visual Acuity Right Eye Distance:   Left  Eye Distance:   Bilateral Distance:    Right Eye Near:   Left Eye Near:    Bilateral Near:     Physical Exam Constitutional:      General: She is not in acute distress.    Appearance: She is well-developed.  Cardiovascular:     Rate and Rhythm: Normal rate.     Heart sounds: Normal heart sounds.  Pulmonary:     Effort: Pulmonary effort is normal.     Breath sounds: Normal breath sounds.  Abdominal:     Tenderness: There is abdominal tenderness in the right upper quadrant and epigastric area.  Skin:    General: Skin is warm and dry.  Neurological:     Mental Status: She is alert and oriented to person, place, and time.      UC Treatments / Results  Labs (all labs ordered are listed, but only abnormal results are displayed) Labs Reviewed  COMPREHENSIVE METABOLIC PANEL - Abnormal; Notable for the following components:      Result Value   BUN <5 (*)    Total Bilirubin 1.9 (*)    All other components within normal limits  POCT URINALYSIS DIP (DEVICE) - Abnormal; Notable for the following components:   Bilirubin Urine MODERATE (*)    Ketones, ur TRACE (*)    Hgb urine dipstick LARGE (*)    Protein, ur 30 (*)    All other components within normal limits  CBC WITH DIFFERENTIAL/PLATELET  LIPASE, BLOOD  POC URINE PREG, ED  POCT PREGNANCY, URINE    EKG   Radiology No results found.  Procedures Procedures (including critical care time)  Medications Ordered in UC Medications  alum & mag hydroxide-simeth (MAALOX/MYLANTA) 200-200-20 MG/5ML suspension 30 mL (30 mLs Oral Given 05/27/19 1424)    And  lidocaine (XYLOCAINE) 2 % viscous mouth solution 15 mL (15 mLs Oral Given 05/27/19 1424)  alum & mag hydroxide-simeth (MAALOX/MYLANTA) 200-200-20 MG/5ML suspension (has no administration in time range)  lidocaine (XYLOCAINE) 2 % viscous mouth solution (has no administration in time range)    Initial Impression / Assessment and Plan / UC Course  I have reviewed the triage  vital signs and the nursing notes.  Pertinent labs & imaging results that were available during my care of the patient were reviewed by me and considered in my medical decision making (see chart for details).     Afebrile, non toxic. Pain has improved. Upper abdominal and  RUQ pain. Will treat for reflux and constipation at this time, with labs pending. Noted bilirubin to urine. Return precautions provided. Patient verbalized understanding and agreeable to plan.    Labs noted normal lipase, wbc and lfts. Elevation in total bilirubin noted. Patient is not acute at time of exam, although likely needs RUQ US on outpatient basis. Low fat diet recommended. Return precautions provided. Nursing staff to notify patient of results.  Final Clinical Impressions(s) / UC Diagnoses   Final diagnoses:  Upper abdominal pain     Discharge Instructions     We will draw some labs to evaluate your liver/gallbladder.  Will notify you of any positive findings and if any changes to treatment are needed. If you have any concerning findings you will likely need to go to the ER for imaging and further workup.  Low fat diet.  Daily omeprazole.  Miralax as needed for constipation.  Any worsening of symptoms please go to the ER: increased pain, fever, vomiting, blood in your stool.    ED Prescriptions    Medication Sig Dispense Auth. Provider   omeprazole (PRILOSEC) 20 MG capsule Take 1 capsule (20 mg total) by mouth daily. 30 capsule Linus MakoBurky, Remigio Mcmillon B, NP   polyethylene glycol powder (GLYCOLAX/MIRALAX) 17 GM/SCOOP powder Take 17 g by mouth daily as needed for mild constipation or moderate constipation. 255 g Georgetta HaberBurky, Coady Train B, NP     Controlled Substance Prescriptions [place] Controlled Substance Registry consulted? Not Applicable   Georgetta HaberBurky, Damarion Mendizabal B, NP 05/28/19 1051

## 2019-05-30 ENCOUNTER — Telehealth (HOSPITAL_COMMUNITY): Payer: Self-pay | Admitting: Emergency Medicine

## 2019-05-30 NOTE — Telephone Encounter (Signed)
Pt informed of blood test results, given follow up info for internal medicine for lab recheck and possible outpatient imagining. Pt agreeable to plan, all questions answered.

## 2019-06-09 ENCOUNTER — Other Ambulatory Visit: Payer: Self-pay

## 2019-06-09 ENCOUNTER — Encounter (INDEPENDENT_AMBULATORY_CARE_PROVIDER_SITE_OTHER): Payer: Self-pay

## 2019-06-09 ENCOUNTER — Ambulatory Visit: Payer: Self-pay | Admitting: Internal Medicine

## 2019-06-09 VITALS — BP 114/69 | HR 100 | Temp 99.0°F | Ht 62.0 in | Wt 207.9 lb

## 2019-06-09 DIAGNOSIS — Z6838 Body mass index (BMI) 38.0-38.9, adult: Secondary | ICD-10-CM

## 2019-06-09 DIAGNOSIS — R1013 Epigastric pain: Secondary | ICD-10-CM

## 2019-06-09 DIAGNOSIS — J45909 Unspecified asthma, uncomplicated: Secondary | ICD-10-CM

## 2019-06-09 DIAGNOSIS — R10811 Right upper quadrant abdominal tenderness: Secondary | ICD-10-CM

## 2019-06-09 DIAGNOSIS — Z720 Tobacco use: Secondary | ICD-10-CM

## 2019-06-09 DIAGNOSIS — E669 Obesity, unspecified: Secondary | ICD-10-CM

## 2019-06-09 DIAGNOSIS — R1011 Right upper quadrant pain: Secondary | ICD-10-CM

## 2019-06-09 NOTE — Progress Notes (Signed)
   CC: RUQ abdominal pain   HPI:  [name] is a 28 y.o. with asthma, obesity, tobacco use disorder who presented or follow up of ruq abdominal pain. Please see problem based charting for evaluation, assessment, and plan.  Past Medical History:  Diagnosis Date  . Asthma   . Obesity   . PE (pulmonary embolism)   . Seasonal allergies   . Tobacco abuse   . UTI (lower urinary tract infection)    Review of Systems:    Review of Systems  Constitutional: Negative for chills and fever.  Respiratory: Negative for cough and shortness of breath.   Cardiovascular: Negative for chest pain.  Gastrointestinal: Negative for abdominal pain, nausea and vomiting.  Neurological: Negative for dizziness and headaches.   Physical Exam:  Vitals:   06/09/19 1412  BP: 114/69  Pulse: 100  Temp: 99 F (37.2 C)  TempSrc: Oral  SpO2: 99%  Weight: 207 lb 14.4 oz (94.3 kg)  Height: 5\' 2"  (1.575 m)   Physical Exam  Constitutional: She is oriented to person, place, and time. She appears well-developed and well-nourished. No distress.  HENT:  Head: Normocephalic and atraumatic.  Eyes: Conjunctivae are normal.  Cardiovascular: Normal rate, regular rhythm and normal heart sounds.  Respiratory: Effort normal and breath sounds normal. No respiratory distress. She has no wheezes.  GI: Soft. Bowel sounds are normal. She exhibits no distension. There is abdominal tenderness (slight in epigastric area).  Musculoskeletal:        General: No edema.  Neurological: She is alert and oriented to person, place, and time.  Skin: She is not diaphoretic.  Psychiatric: She has a normal mood and affect. Her behavior is normal. Judgment and thought content normal.   Assessment & Plan:   See Encounters Tab for problem based charting.  Patient discussed with Dr. Evette Doffing

## 2019-06-09 NOTE — Patient Instructions (Signed)
It was a pleasure to see you today Kara Haynes. Please make the following changes:  It appears that you have an elevation of bilirubin with right sided abdominal pain which causes concern for gallbladder problems. I have ordered an ultrasound for you to get done. I have also ordered some bloodwork to help [place] determine the cause.   If you have any questions or concerns, please call our clinic at [phone] between 9am-5pm and after hours call [phone] and ask for the internal medicine resident on call. If you feel you are having a medical emergency please call 911.   Thank you, we look forward to help you remain healthy!  Lars Mage, MD Internal Medicine PGY3

## 2019-06-10 DIAGNOSIS — R1011 Right upper quadrant pain: Secondary | ICD-10-CM | POA: Insufficient documentation

## 2019-06-10 LAB — HEPATIC FUNCTION PANEL
ALT: 36 IU/L — ABNORMAL HIGH (ref 0–32)
AST: 47 IU/L — ABNORMAL HIGH (ref 0–40)
Albumin: 4.6 g/dL (ref 3.9–5.0)
Alkaline Phosphatase: 76 IU/L (ref 39–117)
Bilirubin Total: 0.7 mg/dL (ref 0.0–1.2)
Bilirubin, Direct: 0.24 mg/dL (ref 0.00–0.40)
Total Protein: 6.9 g/dL (ref 6.0–8.5)

## 2019-06-10 NOTE — Assessment & Plan Note (Signed)
Patient was seen for 3 day history of RUQ abd pain at urgent care on 9/11. Lipase, cbc, cmp, ua were done which showed proteinuria and tbili eleaation. Patient without any liver enzyme abnormalities. She denies nausea, vomiting, abdominal pain, diarrhea, constipation, headaches, dizziness.   Assessment and plan Since patient has been having ruq abdomen with elevated tbilirubin will evaluate for likely cholestatic cause. Patient is at risk given her gender and bmi. Will repeat renal function panel and ruq abdomen ultrasound.    There is a small possibility   -renal function panel  -ruq abdomen

## 2019-06-13 NOTE — Progress Notes (Signed)
Internal Medicine Clinic Attending  Case discussed with Dr. Chundi at the time of the visit.  We reviewed the resident's history and exam and pertinent patient test results.  I agree with the assessment, diagnosis, and plan of care documented in the resident's note. 

## 2019-06-29 ENCOUNTER — Ambulatory Visit: Payer: Self-pay

## 2019-06-29 ENCOUNTER — Encounter: Payer: Self-pay | Admitting: Internal Medicine

## 2019-07-04 ENCOUNTER — Other Ambulatory Visit: Payer: Self-pay

## 2019-07-04 ENCOUNTER — Ambulatory Visit (HOSPITAL_COMMUNITY)
Admission: RE | Admit: 2019-07-04 | Discharge: 2019-07-04 | Disposition: A | Discharge status: Home / Self Care | Payer: Self-pay | Source: Ambulatory Visit | Attending: Student in an Organized Health Care Education/Training Program | Admitting: Student in an Organized Health Care Education/Training Program

## 2019-07-04 ENCOUNTER — Telehealth: Payer: Self-pay | Admitting: *Deleted

## 2019-07-04 DIAGNOSIS — R10811 Right upper quadrant abdominal tenderness: Secondary | ICD-10-CM | POA: Insufficient documentation

## 2019-07-04 NOTE — Telephone Encounter (Signed)
Radiologist is calling dr Ronnald Ramp to discuss pt, gave him her cell# and pager#

## 2019-07-07 ENCOUNTER — Encounter: Payer: Self-pay | Admitting: Internal Medicine

## 2019-07-25 ENCOUNTER — Ambulatory Visit (INDEPENDENT_AMBULATORY_CARE_PROVIDER_SITE_OTHER): Payer: Self-pay | Admitting: Internal Medicine

## 2019-07-25 ENCOUNTER — Encounter: Payer: Self-pay | Admitting: Internal Medicine

## 2019-07-25 ENCOUNTER — Other Ambulatory Visit: Payer: Self-pay

## 2019-07-25 VITALS — BP 112/71 | HR 100 | Temp 99.1°F | Ht 62.0 in | Wt 197.8 lb

## 2019-07-25 DIAGNOSIS — R1011 Right upper quadrant pain: Secondary | ICD-10-CM

## 2019-07-25 NOTE — Patient Instructions (Addendum)
Kara Haynes,  It was nice meeting you today! I am glad you abdominal pain is better.   The ultrasound of your belly showed a bigger bile duct which is a sign of having gallstones. This also makes sense given your previous pain and nausea after fatty meals. The definitive surgery for gallstones is to remove the gallbladder with surgery. You can decrease your risk of developing additional stones by quitting/decreasing smoking, avoiding fatty/greasy meals, and losing weight.  If you develop worsening nausea/vomiting, or pain after a meal that doesn't go away, please go to the emergency room.  We will get blood work for you today, and I will call you with any abnormal results. In the meantime, you may follow-up with the financial counselor for potential insurance options. I will see you in three months and we can discuss a potential surgery.

## 2019-07-25 NOTE — Progress Notes (Signed)
   CC: abdominal pain  HPI:  [name] is a 28 y.o. f with significant PMH as outlined below who presents today for follow-up of her abdominal pain. Please see problem-based charting for additional information.  Past Medical History:  Diagnosis Date  . Asthma   . Obesity   . PE (pulmonary embolism)   . Seasonal allergies   . Tobacco abuse   . UTI (lower urinary tract infection)    Review of Systems:   Review of Systems  Constitutional: Negative for chills and fever.  Respiratory: Negative for shortness of breath.   Cardiovascular: Negative for chest pain.  Gastrointestinal: Positive for abdominal pain. Negative for constipation, diarrhea, nausea and vomiting.  Musculoskeletal: Negative for back pain.  Skin: Negative for itching.  Neurological: Negative for dizziness.   Physical Exam: Vitals:   07/25/19 1402  Weight: 197 lb 12.8 oz (89.7 kg)  Height: 5\' 2"  (1.575 m)   Physical Exam Vitals signs and nursing note reviewed.  Constitutional:      General: She is not in acute distress.    Appearance: She is not ill-appearing.  Cardiovascular:     Rate and Rhythm: Normal rate and regular rhythm.  Pulmonary:     Effort: Pulmonary effort is normal.  Abdominal:     General: Bowel sounds are normal. There is no distension.     Palpations: Abdomen is soft.     Tenderness: There is no abdominal tenderness. Negative signs include Murphy's sign.  Neurological:     Mental Status: She is alert.    CMP Latest Ref Rng & Units 07/25/2019 06/09/2019 05/27/2019  Glucose 65 - 99 mg/dL 98 - 98  BUN 6 - 20 mg/dL 9 - <5(L)  Creatinine 0.57 - 1.00 mg/dL 0.99 - 0.92  Sodium 134 - 144 mmol/L 137 - 138  Potassium 3.5 - 5.2 mmol/L 4.2 - 3.6  Chloride 96 - 106 mmol/L 99 - 102  CO2 20 - 29 mmol/L 22 - 27  Calcium 8.7 - 10.2 mg/dL 9.3 - 9.0  Total Protein 6.0 - 8.5 g/dL 6.4 6.9 6.9  Total Bilirubin 0.0 - 1.2 mg/dL 1.6(H) 0.7 1.9(H)  Alkaline Phos 39 - 117 IU/L 89 76 66   AST 0 - 40 IU/L 70(H) 47(H) 28  ALT 0 - 32 IU/L 43(H) 36(H) 30    Assessment & Plan:   See Encounters Tab for problem based charting.  Patient seen with Dr. Philipp Ovens

## 2019-07-26 LAB — CMP14 + ANION GAP
ALT: 43 IU/L — ABNORMAL HIGH (ref 0–32)
AST: 70 IU/L — ABNORMAL HIGH (ref 0–40)
Albumin/Globulin Ratio: 1.8 (ref 1.2–2.2)
Albumin: 4.1 g/dL (ref 3.9–5.0)
Alkaline Phosphatase: 89 IU/L (ref 39–117)
Anion Gap: 16 mmol/L (ref 10.0–18.0)
BUN/Creatinine Ratio: 9 (ref 9–23)
BUN: 9 mg/dL (ref 6–20)
Bilirubin Total: 1.6 mg/dL — ABNORMAL HIGH (ref 0.0–1.2)
CO2: 22 mmol/L (ref 20–29)
Calcium: 9.3 mg/dL (ref 8.7–10.2)
Chloride: 99 mmol/L (ref 96–106)
Creatinine, Ser: 0.99 mg/dL (ref 0.57–1.00)
GFR calc Af Amer: 89 mL/min/{1.73_m2} (ref >59)
GFR calc non Af Amer: 77 mL/min/{1.73_m2} (ref >59)
Globulin, Total: 2.3 g/dL (ref 1.5–4.5)
Glucose: 98 mg/dL (ref 65–99)
Potassium: 4.2 mmol/L (ref 3.5–5.2)
Sodium: 137 mmol/L (ref 134–144)
Total Protein: 6.4 g/dL (ref 6.0–8.5)

## 2019-08-02 NOTE — Assessment & Plan Note (Addendum)
Pt presents today for follow-up of her RUQ abdominal pain. Pt states that since her initial presentation to Urgent Care on 9/11 her abdominal pain has improved greatly. She denies nausea, vomiting, fever, chills, diarrhea, or constipation. However, she does endorse that the abdominal pain occasionally comes back after eating large or fatty meals, though never as bad at the first time.   RUQ ultrasound completed on 10/19 significant for "Potential filling defect in the common bile duct may represent a small amount of sludge or tumefactive sludge. Artifact is considered. Common bile duct is mildly dilated. MRCP may be helpful for further assessment in the setting of elevated bilirubin. No signs of acute cholecystitis. Potential mild hepatic steatosis."  Assessment - Biliary colic  Discussed at length pt's symptoms correlated to her imaging and lab studies. Explained that she likely passed a gallstone on her first presentation to the urgent care in September. Additionally, talked about how her recurrent abdominal pain is likely due to biliary colic from other stones/sludge obstructing her biliary tree. At this point, would recommend she be referred to general surgery for cholecystectomy, however pt is uninsured and concerned about cost.  Plan - establish with financial counselor in clinic for insurance options - decrease risk of gallstones by quitting/decreasing smoking, avoiding fatty/greasy meals, and losing weight - go to the emergency room if experiencing worsening nausea/vomiting, or pain after a meal that doesn't go away - will follow-up in 3 months to readdress surgical referral   ADDENDUM - Pt's T bili elevated to 1.6 with elevated in AST (70) and ALT (43). Alk phos normal at (89). Will continue to monitor and refer to surgery when financially able to.

## 2019-08-03 NOTE — Progress Notes (Signed)
Internal Medicine Clinic Attending  I saw and evaluated the patient.  I personally confirmed the key portions of the history and exam documented by Dr. Jones and I reviewed pertinent patient test results.  The assessment, diagnosis, and plan were formulated together and I agree with the documentation in the resident's note.     

## 2019-08-15 ENCOUNTER — Encounter: Payer: Self-pay | Admitting: Internal Medicine

## 2019-08-15 ENCOUNTER — Ambulatory Visit: Payer: Self-pay

## 2019-11-03 ENCOUNTER — Encounter: Payer: Self-pay | Admitting: Internal Medicine

## 2019-11-09 ENCOUNTER — Encounter (HOSPITAL_COMMUNITY): Payer: Self-pay

## 2019-11-09 ENCOUNTER — Ambulatory Visit (HOSPITAL_COMMUNITY)
Admission: EM | Admit: 2019-11-09 | Discharge: 2019-11-09 | Disposition: A | Discharge status: Home / Self Care | Payer: Self-pay | Attending: Family Medicine | Admitting: Family Medicine

## 2019-11-09 ENCOUNTER — Other Ambulatory Visit: Payer: Self-pay

## 2019-11-09 DIAGNOSIS — J209 Acute bronchitis, unspecified: Secondary | ICD-10-CM

## 2019-11-09 DIAGNOSIS — J4521 Mild intermittent asthma with (acute) exacerbation: Secondary | ICD-10-CM

## 2019-11-09 DIAGNOSIS — F17200 Nicotine dependence, unspecified, uncomplicated: Secondary | ICD-10-CM

## 2019-11-09 MED ORDER — ALBUTEROL SULFATE HFA 108 (90 BASE) MCG/ACT IN AERS: 2.0000 | INHALATION_SPRAY | RESPIRATORY_TRACT | 0 refills | Status: DC | PRN

## 2019-11-09 MED ORDER — PREDNISONE 10 MG (21) PO TBPK: ORAL_TABLET | Freq: Every day | ORAL | 0 refills | Status: AC

## 2019-11-09 MED ORDER — DEXAMETHASONE SODIUM PHOSPHATE 10 MG/ML IJ SOLN
10.0000 mg | Freq: Once | INTRAMUSCULAR | Status: AC
  Administered 2019-11-09: 10:00:00 10 mg via INTRAMUSCULAR

## 2019-11-09 MED ORDER — DEXAMETHASONE SODIUM PHOSPHATE 10 MG/ML IJ SOLN
INTRAMUSCULAR | Status: AC
  Filled 2019-11-09: qty 1

## 2019-11-09 MED ORDER — ALBUTEROL SULFATE (2.5 MG/3ML) 0.083% IN NEBU: 2.5000 mg | INHALATION_SOLUTION | Freq: Four times a day (QID) | RESPIRATORY_TRACT | 12 refills | Status: DC | PRN

## 2019-11-09 NOTE — ED Triage Notes (Signed)
Pt states she has Asthma  and it flaring up. Pt states she needs a refill on her asthma med.

## 2019-11-09 NOTE — Discharge Instructions (Addendum)
You are having an asthma exacerbation. You have received an injection of steroids in the office today.  I have sent in a new inhaler, solution for your nebulizer, and a prednisone pack of oral steroids for you to start tomorrow. Take as directed on package.   Follow-up with primary care doctor as needed  Go to the emergency department if you are experiencing shortness of breath, high fever, or other concerning symptoms.

## 2019-11-09 NOTE — ED Provider Notes (Signed)
[facility]    CSN: [id number] Arrival date & time: 11/09/19  5638      History   Chief Complaint Chief Complaint  Patient presents with  . Asthma    HPI Kara Haynes is a 29 y.o. female.   Reports that she has been experiencing wheezing, some shortness of breath.  History significant for asthma, smoker, reports that she has asthma flareup at least 2-3 times a year.  Reports that she needs refills on her medications.  Denies headache, fever, chills, muscle aches, rash, or other symptoms.  ROS per HPI  The history is provided by the patient.    Past Medical History:  Diagnosis Date  . Asthma   . Obesity   . PE (pulmonary embolism)   . Seasonal allergies   . Tobacco abuse   . UTI (lower urinary tract infection)     Patient Active Problem List   Diagnosis Date Noted  . RUQ abdominal pain 06/10/2019  . Hepatic steatosis 06/26/2016  . Tobacco abuse 06/26/2016  . AKI (acute kidney injury) (HCC) 01/15/2016  . History of pulmonary embolism 10/26/2015  . Marijuana use 10/26/2015  . Preventative health care 11/22/2014  . Metrorrhagia 10/05/2014  . Abnormal chest CT 08/09/2014  . Asthma exacerbation 08/09/2014    History reviewed. No pertinent surgical history.  OB History   No obstetric history on file.      Home Medications    Prior to Admission medications   Medication Sig Start Date End Date Taking? Authorizing Provider  albuterol (PROVENTIL) (2.5 MG/3ML) 0.083% nebulizer solution Take 3 mLs (2.5 mg total) by nebulization every 6 (six) hours as needed for wheezing or shortness of breath. 11/09/19   Moshe Cipro, NP  albuterol (VENTOLIN HFA) 108 (90 Base) MCG/ACT inhaler Inhale 2 puffs into the lungs every 4 (four) hours as needed for wheezing or shortness of breath. 11/09/19   Moshe Cipro, NP  budesonide-formoterol [facility]) 160-4.5 MCG/ACT inhaler Inhale 2 puffs into the lungs daily. 04/23/18   Eustace Moore, MD    cephALEXin (KEFLEX) 500 MG capsule Take 1 capsule (500 mg total) by mouth 2 (two) times daily. Patient not taking: Reported on 11/09/2019 02/18/19   Payton Mccallum, MD  omeprazole (PRILOSEC) 20 MG capsule Take 1 capsule (20 mg total) by mouth daily. Patient not taking: Reported on 11/09/2019 05/27/19   Linus Mako B, NP  polyethylene glycol powder (GLYCOLAX/MIRALAX) 17 GM/SCOOP powder Take 17 g by mouth daily as needed for mild constipation or moderate constipation. Patient not taking: Reported on 11/09/2019 05/27/19   Georgetta Haber, NP  predniSONE (STERAPRED UNI-PAK 21 TAB) 10 MG (21) TBPK tablet Take by mouth daily for 6 days. Take 6 tablets on day 1, 5 tablets on day 2, 4 tablets on day 3, 3 tablets on day 4, 2 tablets on day 5, 1 tablet on day 6 11/09/19 11/15/19  Moshe Cipro, NP    Family History Family History  Problem Relation Age of Onset  . Other Other        denies family h/o clotting d/o  . Asthma Maternal Grandfather   . Hypertension Maternal Grandfather   . Healthy Mother   . Healthy Father     Social History Social History   Tobacco Use  . Smoking status: Current Some Day Smoker    Packs/day: 0.25    Types: Cigarettes  . Smokeless tobacco: Never Used  . Tobacco comment: smoking less 5-6  cigarettes aday  Substance Use Topics  .  Alcohol use: Yes    Alcohol/week: 0.0 standard drinks    Comment: occ  . Drug use: Yes    Types: Marijuana     Allergies   Fish allergy   Review of Systems Review of Systems   Physical Exam Triage Vital Signs ED Triage Vitals  Enc Vitals Group     BP 11/09/19 0938 128/88     Pulse Rate 11/09/19 0938 78     Resp 11/09/19 0938 18     Temp 11/09/19 0938 98 F (36.7 C)     Temp Source 11/09/19 0938 Oral     SpO2 11/09/19 0938 98 %     Weight 11/09/19 0935 194 lb 12.8 oz (88.4 kg)     Height --      Head Circumference --      Peak Flow --      Pain Score 11/09/19 0935 4     Pain Loc --      Pain Edu? --      Excl.  in [place]? --    No data found.  Updated Vital Signs BP 128/88 (BP Location: Right Arm)   Pulse 78   Temp 98 F (36.7 C) (Oral)   Resp 18   Wt 194 lb 12.8 oz (88.4 kg)   LMP 10/26/2019   SpO2 98%   BMI 35.63 kg/m   Visual Acuity Right Eye Distance:   Left Eye Distance:   Bilateral Distance:    Right Eye Near:   Left Eye Near:    Bilateral Near:     Physical Exam Vitals and nursing note reviewed.  Constitutional:      General: She is not in acute distress.    Appearance: She is well-developed.  HENT:     Head: Normocephalic and atraumatic.     Nose: Nose normal.     Mouth/Throat:     Mouth: Mucous membranes are moist.  Eyes:     Conjunctiva/sclera: Conjunctivae normal.  Cardiovascular:     Rate and Rhythm: Normal rate and regular rhythm.     Heart sounds: Normal heart sounds. No murmur.  Pulmonary:     Effort: Pulmonary effort is normal. No respiratory distress.     Breath sounds: No stridor. Wheezing present. No rhonchi or rales.     Comments: Diffuse wheezing Abdominal:     General: Bowel sounds are normal. There is no distension.     Palpations: Abdomen is soft. There is no mass.     Tenderness: There is no abdominal tenderness. There is no guarding.  Musculoskeletal:        General: Normal range of motion.     Cervical back: Neck supple.  Skin:    General: Skin is warm and dry.  Neurological:     General: No focal deficit present.     Mental Status: She is alert and oriented to person, place, and time.  Psychiatric:        Mood and Affect: Mood normal.        Behavior: Behavior normal.      UC Treatments / Results  Labs (all labs ordered are listed, but only abnormal results are displayed) Labs Reviewed - No data to display  EKG   Radiology No results found.  Procedures Procedures (including critical care time)  Medications Ordered in UC Medications  dexamethasone (DECADRON) injection 10 mg (10 mg Intramuscular Given 11/09/19 1007)     Initial Impression / Assessment and Plan / UC Course  I have  reviewed the triage vital signs and the nursing notes.  Pertinent labs & imaging results that were available during my care of the patient were reviewed by me and considered in my medical decision making (see chart for details).     Acute asthma exacerbation.  Decadron IM given in office today, refilled albuterol inhaler and nebulizer solution, gave Pred pack to start tomorrow.  Instructed to follow-up with primary care provider.  Instructed on when to go to the ER. Final Clinical Impressions(s) / UC Diagnoses   Final diagnoses:  Mild intermittent asthma with exacerbation  Smoker  Acute bronchitis, unspecified organism     Discharge Instructions     You are having an asthma exacerbation. You have received an injection of steroids in the office today.  I have sent in a new inhaler, solution for your nebulizer, and a prednisone pack of oral steroids for you to start tomorrow. Take as directed on package.   Follow-up with primary care doctor as needed  Go to the emergency department if you are experiencing shortness of breath, high fever, or other concerning symptoms.    ED Prescriptions    Medication Sig Dispense Auth. Provider   albuterol (VENTOLIN HFA) 108 (90 Base) MCG/ACT inhaler Inhale 2 puffs into the lungs every 4 (four) hours as needed for wheezing or shortness of breath. 18 g Moshe Cipro, NP   albuterol (PROVENTIL) (2.5 MG/3ML) 0.083% nebulizer solution Take 3 mLs (2.5 mg total) by nebulization every 6 (six) hours as needed for wheezing or shortness of breath. 75 mL Moshe Cipro, NP   predniSONE (STERAPRED UNI-PAK 21 TAB) 10 MG (21) TBPK tablet Take by mouth daily for 6 days. Take 6 tablets on day 1, 5 tablets on day 2, 4 tablets on day 3, 3 tablets on day 4, 2 tablets on day 5, 1 tablet on day 6 21 tablet Moshe Cipro, NP     I have reviewed the PDMP during this encounter.    Moshe Cipro, NP 11/09/19 1015

## 2019-11-10 ENCOUNTER — Ambulatory Visit: Payer: Self-pay | Admitting: Internal Medicine

## 2019-11-10 ENCOUNTER — Encounter: Payer: Self-pay | Admitting: Internal Medicine

## 2019-11-10 ENCOUNTER — Other Ambulatory Visit: Payer: Self-pay

## 2019-11-10 VITALS — BP 137/69 | HR 102 | Temp 98.8°F | Ht 62.0 in | Wt 194.2 lb

## 2019-11-10 DIAGNOSIS — Z7951 Long term (current) use of inhaled steroids: Secondary | ICD-10-CM

## 2019-11-10 DIAGNOSIS — J4520 Mild intermittent asthma, uncomplicated: Secondary | ICD-10-CM

## 2019-11-10 DIAGNOSIS — J4531 Mild persistent asthma with (acute) exacerbation: Secondary | ICD-10-CM

## 2019-11-10 DIAGNOSIS — Z0000 Encounter for general adult medical examination without abnormal findings: Secondary | ICD-10-CM

## 2019-11-10 DIAGNOSIS — R1011 Right upper quadrant pain: Secondary | ICD-10-CM

## 2019-11-10 DIAGNOSIS — Z86711 Personal history of pulmonary embolism: Secondary | ICD-10-CM

## 2019-11-10 DIAGNOSIS — Z6835 Body mass index (BMI) 35.0-35.9, adult: Secondary | ICD-10-CM

## 2019-11-10 DIAGNOSIS — Z79899 Other long term (current) drug therapy: Secondary | ICD-10-CM

## 2019-11-10 DIAGNOSIS — Z7952 Long term (current) use of systemic steroids: Secondary | ICD-10-CM

## 2019-11-10 DIAGNOSIS — R1013 Epigastric pain: Secondary | ICD-10-CM

## 2019-11-10 DIAGNOSIS — Z720 Tobacco use: Secondary | ICD-10-CM

## 2019-11-10 DIAGNOSIS — E669 Obesity, unspecified: Secondary | ICD-10-CM

## 2019-11-10 MED ORDER — BUDESONIDE-FORMOTEROL FUMARATE 80-4.5 MCG/ACT IN AERO: 2.0000 | INHALATION_SPRAY | Freq: Every day | RESPIRATORY_TRACT | 7 refills | Status: DC

## 2019-11-10 MED FILL — SYMBICORT 80-4.5 MCG INH: 80-4.5 | 30 days supply | Qty: 10 | Fill #0

## 2019-11-10 NOTE — Progress Notes (Signed)
   CC: follow-up of asthma and abdominal pain  HPI:  [name] is a 29 y.o. F with significant PMH of asthma, hx of PE in 2015, tobacco use disorder, and obesity, who presents for follow-up of her asthma and abdominal pain. Please see problem based charting for continued management of her ongoing medical conditions.  Past Medical History:  Diagnosis Date  . Asthma   . Obesity   . PE (pulmonary embolism)   . Seasonal allergies   . Tobacco abuse   . UTI (lower urinary tract infection)    Review of Systems:   Review of Systems  Constitutional: Negative for chills and fever.  HENT: Negative for congestion and sore throat.   Respiratory: Negative for cough, shortness of breath and wheezing.   Cardiovascular: Negative for chest pain.  Gastrointestinal: Negative for abdominal pain, diarrhea, nausea and vomiting.   Physical Exam:  Vitals:   11/10/19 1511  BP: 137/69  Pulse: (!) 102  Temp: 98.8 F (37.1 C)  TempSrc: Oral  SpO2: 100%  Weight: 194 lb 3.2 oz (88.1 kg)  Height: 5\' 2"  (1.575 m)   Physical Exam Vitals and nursing note reviewed.  Constitutional:      General: She is not in acute distress.    Appearance: She is obese. She is not ill-appearing.  Cardiovascular:     Rate and Rhythm: Normal rate and regular rhythm.     Heart sounds: Normal heart sounds.  Pulmonary:     Effort: Pulmonary effort is normal. No respiratory distress.     Breath sounds: Normal breath sounds.  Abdominal:     General: Abdomen is flat. Bowel sounds are normal.     Palpations: Abdomen is soft.     Tenderness: There is abdominal tenderness in the epigastric area. There is no guarding or rebound. Negative signs include Murphy's sign.  Neurological:     Mental Status: She is alert.    Assessment & Plan:   See Encounters Tab for problem based charting.  Patient discussed with Dr. 

## 2019-11-10 NOTE — Patient Instructions (Addendum)
It was nice seeing you today!  A prescription for a Symbicort inhaler has been sent into the [facility] for you to pick up. Please take once daily to help control your asthma symptoms.   Your blood work today will check your liver enzymes, and I will call you with any abnormal results. Please continue to work on weight loss and quitting/decreasing smoking, and avoiding fatty/greasy meals.  Please make a follow-up with the financial counselor to work on getting the [employer].

## 2019-11-11 LAB — CMP14 + ANION GAP
ALT: 26 IU/L (ref 0–32)
AST: 33 IU/L (ref 0–40)
Albumin/Globulin Ratio: 1.9 (ref 1.2–2.2)
Albumin: 4.5 g/dL (ref 3.9–5.0)
Alkaline Phosphatase: 102 IU/L (ref 39–117)
Anion Gap: 17 mmol/L (ref 10.0–18.0)
BUN/Creatinine Ratio: 9 (ref 9–23)
BUN: 8 mg/dL (ref 6–20)
Bilirubin Total: 0.7 mg/dL (ref 0.0–1.2)
CO2: 22 mmol/L (ref 20–29)
Calcium: 9.9 mg/dL (ref 8.7–10.2)
Chloride: 100 mmol/L (ref 96–106)
Creatinine, Ser: 0.9 mg/dL (ref 0.57–1.00)
GFR calc Af Amer: 100 mL/min/1.73
GFR calc non Af Amer: 87 mL/min/1.73
Globulin, Total: 2.4 g/dL (ref 1.5–4.5)
Glucose: 96 mg/dL (ref 65–99)
Potassium: 4.1 mmol/L (ref 3.5–5.2)
Sodium: 139 mmol/L (ref 134–144)
Total Protein: 6.9 g/dL (ref 6.0–8.5)

## 2019-11-11 NOTE — Assessment & Plan Note (Signed)
Pt presented to urgent care on 2/24 for shortness of breath due to an asthma exacerbation. Her symptoms improved with IM decadron and albuterol puffs. She started a 6 day oral prednisone taper today. This was her first exacerbation requiring oral steroids in a year and a half.   Pt taking albuterol every morning and an extra puff during the day 2-3 times per week, particularly when she is in the cold or exerting herself. She has not been on her daily Symbicort inhaler for a long time secondary to cost.   On exam today, pt with normal respiratory effort on room air, speaking in full sentence, and with good air movement without wheezing on ausculation.   Assessment - Mild persistent asthma  Plan - finish prednisone taper - refilled lower dose Symbicort (80-4.73mcg) to [facility], pt to pick up if financially feasible  - has prescriptions for albuterol inhalers and nebulizer treatments at home

## 2019-11-11 NOTE — Assessment & Plan Note (Signed)
Offered pt contraceptive options as a woman of child-bearing age. Pt states she does not need birth control and is not interested in contraception as she is only sexually active with women.

## 2019-11-11 NOTE — Assessment & Plan Note (Signed)
Pt having biliary colic 3-5 months ago with subsequent RUQ ultrasound showing mild dilation of the CBD with potential filing defect. Pt unable to get follow-up MRCP due to uninsured status. Since that time, pt had one episode of epigastric and RUQ abdominal pain after eating mozzarella sticks which lasted 30 minutes or so and then self-resolved. She has been trying to work on weight loss to reduce her risk of cholelithiasis. Believes that she's lost approximately 10 lbs.   Assessment - Biliary colic  Plan - repeat CMP today shows return of pt's t bili, alk phos, AST, and ALT to normal limits - continue to work on risk reduction for any recurrent gallstones - pt to follow-up with financial counselor to work on getting the orange card - next step would be to follow-up CBD dilation with MRCP

## 2019-11-11 NOTE — Assessment & Plan Note (Signed)
Pt endorsing smoking a quarter to a half pack of cigarettes per day. State she has tried to quit cold [place] previously which would last for a week at most. She is not interested in any cessation aids at this time as she is not contemplating quitting. Discussed the options available for tobacco cessation should she change her mind, including nicotine patch vs gum, pharmacotherapy, and counseling.

## 2019-11-22 NOTE — Progress Notes (Signed)
Internal Medicine Clinic Attending  Case discussed with Dr. Jones at the time of the visit.  We reviewed the resident's history and exam and pertinent patient test results.  I agree with the assessment, diagnosis, and plan of care documented in the resident's note.  

## 2019-12-05 ENCOUNTER — Ambulatory Visit: Payer: Self-pay

## 2019-12-27 ENCOUNTER — Emergency Department (HOSPITAL_COMMUNITY)
Admission: EM | Admit: 2019-12-27 | Discharge: 2019-12-28 | Disposition: A | Discharge status: Home / Self Care | Payer: Self-pay | Attending: Emergency Medicine | Admitting: Emergency Medicine

## 2019-12-27 ENCOUNTER — Other Ambulatory Visit: Payer: Self-pay

## 2019-12-27 ENCOUNTER — Encounter (HOSPITAL_COMMUNITY): Payer: Self-pay | Admitting: Emergency Medicine

## 2019-12-27 ENCOUNTER — Emergency Department (HOSPITAL_COMMUNITY): Payer: Self-pay

## 2019-12-27 DIAGNOSIS — Z23 Encounter for immunization: Secondary | ICD-10-CM | POA: Insufficient documentation

## 2019-12-27 DIAGNOSIS — Y929 Unspecified place or not applicable: Secondary | ICD-10-CM | POA: Insufficient documentation

## 2019-12-27 DIAGNOSIS — Z8709 Personal history of other diseases of the respiratory system: Secondary | ICD-10-CM | POA: Insufficient documentation

## 2019-12-27 DIAGNOSIS — Z86711 Personal history of pulmonary embolism: Secondary | ICD-10-CM | POA: Insufficient documentation

## 2019-12-27 DIAGNOSIS — S0103XA Puncture wound without foreign body of scalp, initial encounter: Secondary | ICD-10-CM | POA: Insufficient documentation

## 2019-12-27 DIAGNOSIS — S0101XA Laceration without foreign body of scalp, initial encounter: Secondary | ICD-10-CM

## 2019-12-27 DIAGNOSIS — Y939 Activity, unspecified: Secondary | ICD-10-CM | POA: Insufficient documentation

## 2019-12-27 DIAGNOSIS — Y999 Unspecified external cause status: Secondary | ICD-10-CM | POA: Insufficient documentation

## 2019-12-27 DIAGNOSIS — Z720 Tobacco use: Secondary | ICD-10-CM | POA: Insufficient documentation

## 2019-12-27 MED ORDER — TETANUS-DIPHTH-ACELL PERTUSSIS 5-2.5-18.5 LF-MCG/0.5 IM SUSP
0.5000 mL | Freq: Once | INTRAMUSCULAR | Status: AC
  Administered 2019-12-28: 0.5 mL via INTRAMUSCULAR
  Filled 2019-12-27: qty 0.5
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
## 2019-12-28 NOTE — Discharge Instructions (Addendum)
You were seen today after an injury to your scalp.  Your CT scan is negative.  Keep area clean.  Wash as you normally would.  Take tylenol or ibuprofen for pain.

## 2019-12-28 NOTE — ED Notes (Signed)
Pt verbalized understanding of d/c instructions, follow up, wound care and medications.  Pt ambulatory to WR with steady gait.
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
## 2020-01-04 ENCOUNTER — Encounter: Payer: Self-pay | Admitting: *Deleted

## 2020-03-15 ENCOUNTER — Encounter: Payer: Self-pay | Admitting: Internal Medicine

## 2020-03-15 ENCOUNTER — Ambulatory Visit: Payer: Self-pay | Admitting: Internal Medicine

## 2020-03-15 VITALS — BP 128/69 | HR 87 | Temp 98.9°F | Ht 62.0 in | Wt 176.4 lb

## 2020-03-15 DIAGNOSIS — R1013 Epigastric pain: Secondary | ICD-10-CM

## 2020-03-15 DIAGNOSIS — F1010 Alcohol abuse, uncomplicated: Secondary | ICD-10-CM

## 2020-03-15 DIAGNOSIS — K760 Fatty (change of) liver, not elsewhere classified: Secondary | ICD-10-CM

## 2020-03-15 MED ORDER — PANTOPRAZOLE SODIUM 40 MG PO TBEC: 40.0000 mg | DELAYED_RELEASE_TABLET | Freq: Every day | ORAL | 2 refills | Status: DC

## 2020-03-15 NOTE — Patient Instructions (Addendum)
Dear Kara Haynes,  Thank you for allowing [place] to provide your care today. Today we discussed your stomach pain and eye color change    I have ordered cmp labs for you. I will call if any are abnormal.    Today we made the following changes to your medications:    Please start pantoprazole 40mg  daily  Please follow-up in 4 weeks.    Should you have any questions or concerns please call the internal medicine clinic at [phone].    Thank you for choosing [facility].   Gastroesophageal Reflux Disease, Adult Gastroesophageal reflux (GER) happens when acid from the stomach flows up into the tube that connects the mouth and the stomach (esophagus). Normally, food travels down the esophagus and stays in the stomach to be digested. With GER, food and stomach acid sometimes move back up into the esophagus. You may have a disease called gastroesophageal reflux disease (GERD) if the reflux:  Happens often.  Causes frequent or very bad symptoms.  Causes problems such as damage to the esophagus. When this happens, the esophagus becomes sore and swollen (inflamed). Over time, GERD can make small holes (ulcers) in the lining of the esophagus. What are the causes? This condition is caused by a problem with the muscle between the esophagus and the stomach. When this muscle is weak or not normal, it does not close properly to keep food and acid from coming back up from the stomach. The muscle can be weak because of:  Tobacco use.  Pregnancy.  Having a certain type of hernia (hiatal hernia).  Alcohol use.  Certain foods and drinks, such as coffee, chocolate, onions, and peppermint. What increases the risk? You are more likely to develop this condition if you:  Are overweight.  Have a disease that affects your connective tissue.  Use NSAID medicines. What are the signs or symptoms? Symptoms of this condition include:  Heartburn.  Difficult or painful  swallowing.  The feeling of having a lump in the throat.  A bitter taste in the mouth.  Bad breath.  Having a lot of saliva.  Having an upset or bloated stomach.  Belching.  Chest pain. Different conditions can cause chest pain. Make sure you see your doctor if you have chest pain.  Shortness of breath or noisy breathing (wheezing).  Ongoing (chronic) cough or a cough at night.  Wearing away of the surface of teeth (tooth enamel).  Weight loss. How is this treated? Treatment will depend on how bad your symptoms are. Your doctor may suggest:  Changes to your diet.  Medicine.  Surgery. Follow these instructions at home: Eating and drinking   Follow a diet as told by your doctor. You may need to avoid foods and drinks such as: ? Coffee and tea (with or without caffeine). ? Drinks that contain alcohol. ? Energy drinks and sports drinks. ? Bubbly (carbonated) drinks or sodas. ? Chocolate and cocoa. ? Peppermint and mint flavorings. ? Garlic and onions. ? Horseradish. ? Spicy and acidic foods. These include peppers, chili powder, curry powder, vinegar, hot sauces, and BBQ sauce. ? Citrus fruit juices and citrus fruits, such as oranges, lemons, and limes. ? Tomato-based foods. These include red sauce, chili, salsa, and pizza with red sauce. ? Fried and fatty foods. These include donuts, french fries, potato chips, and high-fat dressings. ? High-fat meats. These include hot dogs, rib eye steak, sausage, ham, and bacon. ? High-fat dairy items, such as whole milk, butter, and cream  cheese.  Eat small meals often. Avoid eating large meals.  Avoid drinking large amounts of liquid with your meals.  Avoid eating meals during the 2-3 hours before bedtime.  Avoid lying down right after you eat.  Do not exercise right after you eat. Lifestyle   Do not use any products that contain nicotine or tobacco. These include cigarettes, e-cigarettes, and chewing tobacco. If you  need help quitting, ask your doctor.  Try to lower your stress. If you need help doing this, ask your doctor.  If you are overweight, lose an amount of weight that is healthy for you. Ask your doctor about a safe weight loss goal. General instructions  Pay attention to any changes in your symptoms.  Take over-the-counter and prescription medicines only as told by your doctor. Do not take aspirin, ibuprofen, or other NSAIDs unless your doctor says it is okay.  Wear loose clothes. Do not wear anything tight around your waist.  Raise (elevate) the head of your bed about 6 inches (15 cm).  Avoid bending over if this makes your symptoms worse.  Keep all follow-up visits as told by your doctor. This is important. Contact a doctor if:  You have new symptoms.  You lose weight and you do not know why.  You have trouble swallowing or it hurts to swallow.  You have wheezing or a cough that keeps happening.  Your symptoms do not get better with treatment.  You have a hoarse voice. Get help right away if:  You have pain in your arms, neck, jaw, teeth, or back.  You feel sweaty, dizzy, or light-headed.  You have chest pain or shortness of breath.  You throw up (vomit) and your throw-up looks like blood or coffee grounds.  You pass out (faint).  Your poop (stool) is bloody or black.  You cannot swallow, drink, or eat. Summary  If a person has gastroesophageal reflux disease (GERD), food and stomach acid move back up into the esophagus and cause symptoms or problems such as damage to the esophagus.  Treatment will depend on how bad your symptoms are.  Follow a diet as told by your doctor.  Take all medicines only as told by your doctor. This information is not intended to replace advice given to you by your health care provider. Make sure you discuss any questions you have with your health care provider. Document Revised: 03/10/2018 Document Reviewed: 03/10/2018 Elsevier  Patient Education  [employer].

## 2020-03-16 LAB — CMP14 + ANION GAP
ALT: 27 IU/L (ref 0–32)
AST: 39 IU/L (ref 0–40)
Albumin/Globulin Ratio: 1.6 (ref 1.2–2.2)
Albumin: 4.2 g/dL (ref 3.9–5.0)
Alkaline Phosphatase: 82 IU/L (ref 48–121)
Anion Gap: 17 mmol/L (ref 10.0–18.0)
BUN/Creatinine Ratio: 10 (ref 9–23)
BUN: 9 mg/dL (ref 6–20)
Bilirubin Total: 1.2 mg/dL (ref 0.0–1.2)
CO2: 22 mmol/L (ref 20–29)
Calcium: 9.4 mg/dL (ref 8.7–10.2)
Chloride: 100 mmol/L (ref 96–106)
Creatinine, Ser: 0.9 mg/dL (ref 0.57–1.00)
GFR calc Af Amer: 99 mL/min/{1.73_m2} (ref >59)
GFR calc non Af Amer: 86 mL/min/{1.73_m2} (ref >59)
Globulin, Total: 2.6 g/dL (ref 1.5–4.5)
Glucose: 84 mg/dL (ref 65–99)
Potassium: 4.1 mmol/L (ref 3.5–5.2)
Sodium: 139 mmol/L (ref 134–144)
Total Protein: 6.8 g/dL (ref 6.0–8.5)

## 2020-03-17 ENCOUNTER — Encounter: Payer: Self-pay | Admitting: Internal Medicine

## 2020-03-17 DIAGNOSIS — F1010 Alcohol abuse, uncomplicated: Secondary | ICD-10-CM | POA: Insufficient documentation

## 2020-03-17 NOTE — Progress Notes (Signed)
CC: Yellow eyes  HPI: Kara Haynes is a 29 y.o. with PMH listed below presenting with complaint of icteric sclerae. Please see problem based assessment and plan for further details.  Past Medical History:  Diagnosis Date  . Asthma   . Obesity   . PE (pulmonary embolism)   . Seasonal allergies   . Tobacco abuse   . UTI (lower urinary tract infection)    Review of Systems: Review of Systems  Constitutional: Negative for chills, fever and malaise/fatigue.  Eyes: Negative for blurred vision and double vision.  Respiratory: Negative for cough and shortness of breath.   Cardiovascular: Negative for chest pain, palpitations and leg swelling.  Gastrointestinal: Negative for abdominal pain, constipation, diarrhea, nausea and vomiting.  Genitourinary: Negative for dysuria, frequency and urgency.  Neurological: Negative for dizziness, tingling, tremors and headaches.    Physical Exam: Vitals:   03/15/20 1555  BP: 128/69  Pulse: 87  Temp: 98.9 F (37.2 C)  TempSrc: Oral  SpO2: 100%  Weight: 176 lb 6.4 oz (80 kg)  Height: 5\' 2"  (1.575 m)   Physical Exam Constitutional:      General: She is not in acute distress.    Appearance: Normal appearance.  HENT:     Head: Normocephalic and atraumatic.     Nose: Nose normal.     Mouth/Throat:     Mouth: Mucous membranes are dry.     Pharynx: Oropharynx is clear.  Eyes:     General: Scleral icterus (Icteric sclerae bilaterally) present.  Cardiovascular:     Rate and Rhythm: Normal rate and regular rhythm.     Pulses: Normal pulses.     Heart sounds: Normal heart sounds. No murmur heard.   Pulmonary:     Effort: Pulmonary effort is normal.     Breath sounds: No wheezing or rales.  Abdominal:     General: Abdomen is flat. Bowel sounds are normal. There is no distension.     Palpations: Abdomen is soft.     Tenderness: There is no abdominal tenderness (negative Murphy's Sign).  Musculoskeletal:        General: No  swelling. Normal range of motion.     Cervical back: Normal range of motion and neck supple.  Skin:    General: Skin is warm and dry.     Coloration: Skin is not jaundiced.  Neurological:     General: No focal deficit present.     Mental Status: She is alert and oriented to person, place, and time.     Comments: No asterixis     Assessment & Plan:   Alcohol abuse Kara Haynes presents w/ hx of significant alcohol use. Mentions drinking about 3-4 shots of vodka daily. Recently up to 5 shots daily due to celebration of her 29th birthday. Score of 6 on AUDIT-C. Denies any hx of withdrawal symptoms, seizures. Currently no interested in pursuing cessation.  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 She was in her usual state of health until about 2 weeks ago when she began to drink significantly larger amount of alcohol than usual. She mentions going out and drinking 5 shots of vodka daily especially around her 44th birthday which was on the [birth year]th. She mentions noticing some yellowness of her eyes  as well as epigastric pain. She mentions having pain with food. Exacerbated by alcohol and spicy food. Has not tried any meds for alleviation. She mentions she has had prior episode of yellow eyes but is concerned this may be permanent.  A/P Present w/ abdominal pain and yellow eyes. Mentions hx of significant alcohol use. Prior abd ultrasound showing mild hepatic steatosis. Chart review shows prior episode of hyperbilirubinemia w/ elevated LFTs thought to be due to transient biliary obstruction. Exam not consistent with gall bladder pathology. No asterixis or hepatic encephalopathy. 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 - CMP     Patient discussed with Dr. Heide Spark  -Kara Haynes, PGY3 [facility] Medicine Pager: [phone]

## 2020-03-17 NOTE — Assessment & Plan Note (Addendum)
Kara Haynes presents w/ hx of significant alcohol use. Mentions drinking about 3-4 shots of vodka daily. Recently up to 5 shots daily due to celebration of her 29th birthday. Score of 6 on AUDIT-C. Denies any hx of withdrawal symptoms, seizures. Currently no interested in pursuing cessation.  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

## 2020-03-17 NOTE — Assessment & Plan Note (Addendum)
Kara Haynes is a 29 yo F w/ PMH of hepatic steatosis presenting to [facility] with complaint of yellow eyes. She was in her usual state of health until about 2 weeks ago when she began to drink significantly larger amount of alcohol than usual. She mentions going out and drinking 5 shots of vodka daily especially around her 28th birthday which was on the [birth year]th. She mentions noticing some yellowness of her eyes as well as epigastric pain. She mentions having pain with food. Exacerbated by alcohol and spicy food. Has not tried any meds for alleviation. She mentions she has had prior episode of yellow eyes but is concerned this may be permanent.  A/P Present w/ abdominal pain and yellow eyes. Mentions hx of significant alcohol use. Prior abd ultrasound showing mild hepatic steatosis. Chart review shows prior episode of hyperbilirubinemia w/ elevated LFTs thought to be due to transient biliary obstruction. Exam not consistent with gall bladder pathology. No asterixis or hepatic encephalopathy. 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 - CMP

## 2020-03-21 ENCOUNTER — Telehealth: Payer: Self-pay | Admitting: Internal Medicine

## 2020-03-21 NOTE — Telephone Encounter (Signed)
Discussed negative lab results with Ms.Wright-Madkins. All other questions and concerns addressed.

## 2020-03-26 NOTE — Progress Notes (Signed)
Internal Medicine Clinic Attending ° °Case discussed with Dr. Lee at the time of the visit.  We reviewed the resident’s history and exam and pertinent patient test results.  I agree with the assessment, diagnosis, and plan of care documented in the resident’s note.  °

## 2020-04-02 ENCOUNTER — Other Ambulatory Visit: Payer: Self-pay

## 2020-04-02 ENCOUNTER — Other Ambulatory Visit (HOSPITAL_COMMUNITY): Payer: Self-pay

## 2020-04-02 ENCOUNTER — Emergency Department (HOSPITAL_COMMUNITY)
Admission: EM | Admit: 2020-04-02 | Discharge: 2020-04-02 | Disposition: A | Discharge status: Home / Self Care | Payer: Self-pay | Attending: Emergency Medicine | Admitting: Emergency Medicine

## 2020-04-02 ENCOUNTER — Emergency Department (HOSPITAL_COMMUNITY): Payer: Self-pay

## 2020-04-02 ENCOUNTER — Encounter (HOSPITAL_COMMUNITY): Payer: Self-pay | Admitting: *Deleted

## 2020-04-02 DIAGNOSIS — N3001 Acute cystitis with hematuria: Secondary | ICD-10-CM | POA: Insufficient documentation

## 2020-04-02 DIAGNOSIS — Z79899 Other long term (current) drug therapy: Secondary | ICD-10-CM | POA: Insufficient documentation

## 2020-04-02 DIAGNOSIS — R109 Unspecified abdominal pain: Secondary | ICD-10-CM

## 2020-04-02 DIAGNOSIS — J45901 Unspecified asthma with (acute) exacerbation: Secondary | ICD-10-CM | POA: Insufficient documentation

## 2020-04-02 DIAGNOSIS — R1013 Epigastric pain: Secondary | ICD-10-CM | POA: Insufficient documentation

## 2020-04-02 DIAGNOSIS — N83202 Unspecified ovarian cyst, left side: Secondary | ICD-10-CM | POA: Insufficient documentation

## 2020-04-02 DIAGNOSIS — F17210 Nicotine dependence, cigarettes, uncomplicated: Secondary | ICD-10-CM | POA: Insufficient documentation

## 2020-04-02 DIAGNOSIS — N83201 Unspecified ovarian cyst, right side: Secondary | ICD-10-CM | POA: Insufficient documentation

## 2020-04-02 LAB — URINALYSIS, ROUTINE W REFLEX MICROSCOPIC
Glucose, UA: NEGATIVE mg/dL
Ketones, ur: NEGATIVE mg/dL
Nitrite: NEGATIVE
Protein, ur: 30 mg/dL — AB
RBC / HPF: >50 RBC/hpf — ABNORMAL HIGH (ref 0–5)
Specific Gravity, Urine: 1.039 — ABNORMAL HIGH (ref 1.005–1.030)
WBC, UA: >50 WBC/hpf — ABNORMAL HIGH (ref 0–5)
pH: 5 (ref 5.0–8.0)

## 2020-04-02 LAB — LIPASE, BLOOD: Lipase: 25 U/L (ref 11–51)

## 2020-04-02 LAB — CBC
HCT: 40.9 % (ref 36.0–46.0)
Hemoglobin: 13.8 g/dL (ref 12.0–15.0)
MCH: 33.7 pg (ref 26.0–34.0)
MCHC: 33.7 g/dL (ref 30.0–36.0)
MCV: 100 fL (ref 80.0–100.0)
Platelets: 378 10*3/uL (ref 150–400)
RBC: 4.09 MIL/uL (ref 3.87–5.11)
RDW: 13.9 % (ref 11.5–15.5)
WBC: 5.3 10*3/uL (ref 4.0–10.5)
nRBC: 0 % (ref 0.0–0.2)

## 2020-04-02 LAB — I-STAT BETA HCG BLOOD, ED (MC, WL, AP ONLY): I-stat hCG, quantitative: <5 m[IU]/mL (ref <5)

## 2020-04-02 LAB — COMPREHENSIVE METABOLIC PANEL
ALT: 25 U/L (ref 0–44)
AST: 43 U/L — ABNORMAL HIGH (ref 15–41)
Albumin: 3.6 g/dL (ref 3.5–5.0)
Alkaline Phosphatase: 71 U/L (ref 38–126)
Anion gap: 10 (ref 5–15)
BUN: 7 mg/dL (ref 6–20)
CO2: 25 mmol/L (ref 22–32)
Calcium: 9.2 mg/dL (ref 8.9–10.3)
Chloride: 102 mmol/L (ref 98–111)
Creatinine, Ser: 0.83 mg/dL (ref 0.44–1.00)
GFR calc Af Amer: >60 mL/min (ref >60)
GFR calc non Af Amer: >60 mL/min (ref >60)
Glucose, Bld: 99 mg/dL (ref 70–99)
Potassium: 3.6 mmol/L (ref 3.5–5.1)
Sodium: 137 mmol/L (ref 135–145)
Total Bilirubin: 0.9 mg/dL (ref 0.3–1.2)
Total Protein: 6.8 g/dL (ref 6.5–8.1)

## 2020-04-02 MED ORDER — CEPHALEXIN 500 MG PO CAPS
500.0000 mg | ORAL_CAPSULE | Freq: Four times a day (QID) | ORAL | 0 refills | Status: AC
Start: 2020-04-02 — End: 2020-04-09

## 2020-04-02 MED ORDER — CEPHALEXIN 250 MG PO CAPS
500.0000 mg | ORAL_CAPSULE | Freq: Once | ORAL | Status: AC
  Administered 2020-04-02: 500 mg via ORAL
  Filled 2020-04-02: qty 2

## 2020-04-02 MED ORDER — SODIUM CHLORIDE 0.9% FLUSH: 3.0000 mL | Freq: Once | INTRAVENOUS | Status: DC

## 2020-04-02 MED ORDER — KETOROLAC TROMETHAMINE 60 MG/2ML IM SOLN
60.0000 mg | Freq: Once | INTRAMUSCULAR | Status: AC
  Administered 2020-04-02: 60 mg via INTRAMUSCULAR
  Filled 2020-04-02: qty 2

## 2020-04-02 NOTE — ED Notes (Signed)
Patient verbalizes understanding of discharge instructions. Opportunity for questioning and answers were provided. Armband removed by staff, pt discharged from ED.  

## 2020-04-02 NOTE — Discharge Instructions (Addendum)
As we discussed, your CT scan did show evidence of ovarian cyst noted.  This can be followed up with outpatient OB/GYN office for further evaluation.  Your CT scan did not show any evidence of gallstones.  Your urine did show evidence of infection.  You can take Tylenol or Ibuprofen as directed for pain. You can alternate Tylenol and Ibuprofen every 4 hours. If you take Tylenol at 1pm, then you can take Ibuprofen at 5pm. Then you can take Tylenol again at 9pm.   Take antibiotics as directed. Please take all of your antibiotics until finished.

## 2020-04-02 NOTE — ED Triage Notes (Signed)
The pt has known gallstones  Her abd has been hurting since yesterday.  She got up this am to go to work and her pain  In her abd was worse.  lmp July 4th

## 2020-04-02 NOTE — ED Provider Notes (Signed)
[facility] EMERGENCY DEPARTMENT Provider Note   CSN: [id number] Arrival date & time: 04/02/20  0601     History Chief Complaint  Patient presents with  . Cholelithiasis    Kara Haynes is a 29 y.o. female past history of asthma, obesity, PE who presents for evaluation of abdominal pain that began yesterday.  She states that the pain is mostly in the epigastric region.  She describes it as a sharp pain.  She she has had some nausea but no vomiting.  She states she was told she has gallstones and she was concerned because she ate some spicy food prior to onset of symptoms.  She states that this morning when she woke up, she was still having the pain, prompting ED visit.  She does report that since being here, the pain has improved.  She states that she was told she had gallstones in October 2020.  She has not had any further follow-up.  She does report that she is a daily drinker and drinks about 4-5 shots of liquor a day.  She has not had any fever, chest pain, difficulty breathing, dysuria, hematuria.  The history is provided by the patient.       Past Medical History:  Diagnosis Date  . Asthma   . Obesity   . PE (pulmonary embolism)   . Seasonal allergies   . Tobacco abuse   . UTI (lower urinary tract infection)     Patient Active Problem List   Diagnosis Date Noted  . Alcohol abuse 03/17/2020  . RUQ abdominal pain 06/10/2019  . Hepatic steatosis 06/26/2016  . Tobacco abuse 06/26/2016  . History of pulmonary embolism 10/26/2015  . Marijuana use 10/26/2015  . Preventative health care 11/22/2014  . Metrorrhagia 10/05/2014  . Asthma exacerbation 08/09/2014    History reviewed. No pertinent surgical history.   OB History   No obstetric history on file.     Family History  Problem Relation Age of Onset  . Other Other        denies family h/o clotting d/o  . Asthma Maternal Grandfather   . Hypertension Maternal Grandfather   .  Healthy Mother   . Healthy Father     Social History   Tobacco Use  . Smoking status: Current Some Day Smoker    Packs/day: 0.25    Types: Cigarettes  . Smokeless tobacco: Never Used  . Tobacco comment: smoking less 5-6  cigarettes aday  Vaping Use  . Vaping Use: Never used  Substance Use Topics  . Alcohol use: Yes    Alcohol/week: 0.0 standard drinks    Comment: occ  . Drug use: Yes    Types: Marijuana    Home Medications Prior to Admission medications   Medication Sig Start Date End Date Taking? Authorizing Provider  albuterol (PROVENTIL) (2.5 MG/3ML) 0.083% nebulizer solution Take 3 mLs (2.5 mg total) by nebulization every 6 (six) hours as needed for wheezing or shortness of breath. 11/09/19  Yes Moshe Cipro, NP  albuterol (VENTOLIN HFA) 108 (90 Base) MCG/ACT inhaler Inhale 2 puffs into the lungs every 4 (four) hours as needed for wheezing or shortness of breath. Patient not taking: Reported on 04/02/2020 11/09/19   Moshe Cipro, NP  budesonide-formoterol [facility]) 80-4.5 MCG/ACT inhaler Inhale 2 puffs into the lungs daily. Patient not taking: Reported on 04/02/2020 11/10/19   Thom Chimes, MD  cephALEXin (KEFLEX) 500 MG capsule Take 1 capsule (500 mg total) by mouth 4 (four) times daily  for 7 days. 04/02/20 04/09/20  Maxwell Caul, PA-C  pantoprazole (PROTONIX) 40 MG tablet Take 1 tablet (40 mg total) by mouth daily. 03/15/20   Theotis Barrio, MD    Allergies    Fish allergy  Review of Systems   Review of Systems  Constitutional: Negative for fever.  Respiratory: Negative for cough and shortness of breath.   Cardiovascular: Negative for chest pain.  Gastrointestinal: Positive for abdominal pain. Negative for nausea and vomiting.  Genitourinary: Negative for dysuria and hematuria.  Neurological: Negative for headaches.  All other systems reviewed and are negative.   Physical Exam Updated Vital Signs BP 123/90   Pulse 73   Temp 98.1 F (36.7 C) (Oral)    Resp 16   Ht 5\' 2"  (1.575 m)   Wt 79.8 kg   LMP 03/15/2020   SpO2 100%   BMI 32.19 kg/m   Physical Exam Vitals and nursing note reviewed.  Constitutional:      Appearance: Normal appearance. She is well-developed.     Comments: Sitting comfortably on examination table  HENT:     Head: Normocephalic and atraumatic.  Eyes:     General: Lids are normal.     Conjunctiva/sclera: Conjunctivae normal.     Pupils: Pupils are equal, round, and reactive to light.  Cardiovascular:     Rate and Rhythm: Normal rate and regular rhythm.     Pulses: Normal pulses.     Heart sounds: Normal heart sounds. No murmur heard.  No friction rub. No gallop.   Pulmonary:     Effort: Pulmonary effort is normal.     Breath sounds: Normal breath sounds.  Abdominal:     Palpations: Abdomen is soft. Abdomen is not rigid.     Tenderness: There is abdominal tenderness in the epigastric area. There is no guarding.     Comments: Abdomen soft, nondistended.  Tenderness palpation in epigastric region.  No rigidity, guarding.  No CVA tenderness noted bilaterally.  Musculoskeletal:        General: Normal range of motion.     Cervical back: Full passive range of motion without pain.  Skin:    General: Skin is warm and dry.     Capillary Refill: Capillary refill takes less than 2 seconds.  Neurological:     Mental Status: She is alert and oriented to person, place, and time.  Psychiatric:        Speech: Speech normal.     ED Results / Procedures / Treatments   Labs (all labs ordered are listed, but only abnormal results are displayed) Labs Reviewed  COMPREHENSIVE METABOLIC PANEL - Abnormal; Notable for the following components:      Result Value   AST 43 (*)    All other components within normal limits  URINALYSIS, ROUTINE W REFLEX MICROSCOPIC - Abnormal; Notable for the following components:   Color, Urine AMBER (*)    APPearance CLOUDY (*)    Specific Gravity, Urine 1.039 (*)    Hgb urine dipstick  SMALL (*)    Bilirubin Urine MODERATE (*)    Protein, ur 30 (*)    Leukocytes,Ua MODERATE (*)    RBC / HPF >50 (*)    WBC, UA >50 (*)    Bacteria, UA RARE (*)    All other components within normal limits  LIPASE, BLOOD  CBC  I-STAT BETA HCG BLOOD, ED ([facility], [facility], [facility] ONLY)    EKG None  Radiology CT Renal Stone Study  Result  Date: 04/02/2020 CLINICAL DATA:  Epigastric pain for few days EXAM: CT ABDOMEN AND PELVIS WITHOUT CONTRAST TECHNIQUE: Multidetector CT imaging of the abdomen and pelvis was performed following the standard protocol without IV contrast. COMPARISON:  None. FINDINGS: Lower chest: No acute abnormality. Hepatobiliary: No focal liver abnormality is seen. No gallstones, gallbladder wall thickening, or biliary dilatation. Pancreas: Unremarkable. No pancreatic ductal dilatation or surrounding inflammatory changes. Spleen: Normal in size without focal abnormality. Adrenals/Urinary Tract: Adrenal glands are within normal limits. Kidneys demonstrate no renal calculi or urinary tract obstructive changes. The bladder is decompressed. Stomach/Bowel: Appendix is within normal limits. No obstructive or inflammatory changes of the large or small bowel are seen. Stomach is decompressed. Vascular/Lymphatic: No significant vascular findings are present. No enlarged abdominal or pelvic lymph nodes. Reproductive: Uterus is within normal limits. Cystic changes are noted within the ovaries bilaterally but particularly on the left. Combination of cystic changes in the left ovary measures 6.3 x 4.2 cm. Other: No abdominal wall hernia or abnormality. No abdominopelvic ascites. Musculoskeletal: No acute or significant osseous findings. IMPRESSION: Cystic changes within the ovaries bilaterally. The left ovary is considerably enlarged with cystic change but incompletely evaluated on this exam. Ultrasound of the pelvis may be helpful for further evaluation. These changes are likely physiologic in nature. No other  focal abnormality is noted. Electronically Signed   By: Alcide Clever M.D.   On: 04/02/2020 14:38    Procedures Procedures (including critical care time)  Medications Ordered in ED Medications  sodium chloride flush (NS) 0.9 % injection 3 mL (has no administration in time range)  cephALEXin (KEFLEX) capsule 500 mg (has no administration in time range)  ketorolac (TORADOL) injection 60 mg (60 mg Intramuscular Given 04/02/20 1329)    ED Course  I have reviewed the triage vital signs and the nursing notes.  Pertinent labs & imaging results that were available during my care of the patient were reviewed by me and considered in my medical decision making (see chart for details).    MDM Rules/Calculators/A&P                          29 year old female who presents for evaluation of epigastric abdominal pain that began yesterday.  Associated nausea.  No vomiting.  States she has a history of gallstones.  Was concerned because she ate something spicy yesterday prior to onset of symptoms.  No fever, vomiting.  Initially arrival, she is afebrile, nontoxic-appearing.  Vital signs are stable.  She does report that she has had improvement in pain since being here in the emergency department.  On exam, she has tenderness palpation in epigastric region.  Consider hepatobiliary etiology versus infectious etiology versus GU etiology.  Labs ordered at triage.  I-STAT beta negative.  CMP shows AST of 43, ALT of 25, total bili of 0.9.  Lipase is normal.  CBC shows no leukocytosis or anemia.  UA has moderate leukocytes, pyuria, bacteria.  There is squamous epithelium.   I reviewed patient's record.  She had an ultrasound done in October 2020 after she had some elevated LFTs.  At that time, there were no definitive gallstones noted in the gallbladder.  There was a questionable hypoechoic area in the CBD that they could not further characterize but could be considered gallstone.  CT scan shows no evidence of  gallstones, gallbladder wall thickening, biliary dilatation.  There is evidence of bilateral ovarian cyst.  The left ovary has cystic changes noted  6 x 4 cm.  Appendix is normal.  Discussed results with patient.  Patient has no lower abdominal tenderness on exam.  Repeat evaluation of her abdominal exam shows her pain is completely resolved.  She has no more pain per her report.  I discussed results with patient.  I did offer her ultrasound imaging here today but patient declined.  She would rather have outpatient follow-up with OB/GYN.  Given her reassuring exam, I feel that this is reasonable.  Her exam today is not concerning for torsion.  We will plan to treat for UTI.  Patient with no known drug allergies. At this time, patient exhibits no emergent life-threatening condition that require further evaluation in ED or admission. Patient had ample opportunity for questions and discussion. All patient's questions were answered with full understanding. Strict return precautions discussed. Patient expresses understanding and agreement to plan.   Portions of this note were generated with Scientist, clinical (histocompatibility and immunogenetics)Dragon dictation software. Dictation errors may occur despite best attempts at proofreading.  Final Clinical Impression(s) / ED Diagnoses Final diagnoses:  Abdominal pain  Cysts of both ovaries  Acute cystitis with hematuria    Rx / DC Orders ED Discharge Orders         Ordered    cephALEXin (KEFLEX) 500 MG capsule  4 times daily     Discontinue  Reprint     04/02/20 1500           Rosana HoesLayden, Dilpreet Faires A, PA-C 04/02/20 1504    Little, Ambrose Finlandachel Morgan, MD 04/02/20 1517

## 2020-04-02 NOTE — ED Notes (Signed)
Per PA holding off on [place] at this time.

## 2020-04-27 ENCOUNTER — Ambulatory Visit (INDEPENDENT_AMBULATORY_CARE_PROVIDER_SITE_OTHER): Payer: Self-pay | Admitting: Obstetrics and Gynecology

## 2020-04-27 ENCOUNTER — Encounter: Payer: Self-pay | Admitting: Obstetrics and Gynecology

## 2020-04-27 ENCOUNTER — Other Ambulatory Visit: Payer: Self-pay

## 2020-04-27 VITALS — BP 130/91 | HR 92 | Ht 62.0 in | Wt 176.4 lb

## 2020-04-27 DIAGNOSIS — N83202 Unspecified ovarian cyst, left side: Secondary | ICD-10-CM | POA: Insufficient documentation

## 2020-04-27 DIAGNOSIS — N83209 Unspecified ovarian cyst, unspecified side: Secondary | ICD-10-CM

## 2020-04-27 DIAGNOSIS — Z124 Encounter for screening for malignant neoplasm of cervix: Secondary | ICD-10-CM

## 2020-04-27 DIAGNOSIS — E669 Obesity, unspecified: Secondary | ICD-10-CM

## 2020-04-27 DIAGNOSIS — Z86711 Personal history of pulmonary embolism: Secondary | ICD-10-CM

## 2020-04-27 NOTE — Progress Notes (Signed)
Obstetrics and Gynecology New Patient Evaluation  Appointment Date: 04/27/2020  OBGYN Clinic: [facility]  Primary Care Provider: Evlyn Kanner  Referring Provider: [facility]  Chief Complaint: pelvic cysts seen in ED  History of Present Illness: Kara Haynes is a 29 y.o. African-American [id number] (Patient's last menstrual period was 04/22/2020 (exact date).), seen for the above chief complaint. Her past medical history is significant for h/o PE, BMI 30s, h/o transaminitis, h/o trich  Patient went to ED on 7/19 for epigastric pain, which she states is gone. CT renal showed likely ovarian cysts (see below). Pt denies prior h/o these.     Review of Systems: Pertinent items noted in HPI and remainder of comprehensive ROS otherwise negative.   Patient Active Problem List   Diagnosis Date Noted  . Ovarian cyst 04/27/2020  . Alcohol abuse 03/17/2020  . Hepatic steatosis 06/26/2016  . Tobacco abuse 06/26/2016  . History of pulmonary embolism 10/26/2015  . Marijuana use 10/26/2015    Past Medical History:  Past Medical History:  Diagnosis Date  . Asthma   . Asthma exacerbation 08/09/2014  . CAP (community acquired pneumonia) 2017  . Obesity (BMI 30-39.9)   . PE (pulmonary embolism) 2017   ?etiology due to CAP  . Seasonal allergies   . Tobacco abuse   . Trichomoniasis   . UTI (lower urinary tract infection)     Past Surgical History:  Past Surgical History:  Procedure Laterality Date  . NO PAST SURGERIES      Past Obstetrical History:  OB History  Gravida Para Term Preterm AB Living  0 0 0 0 0 0  SAB TAB Ectopic Multiple Live Births  0 0 0 0 0   Past Gynecological History: As per HPI. Periods: qmonth, regular, 5 days.  History of Pap Smear(s): Yes.   Last pap 2016, which was negative History of STI(s): Yes.   She is currently using no method for contraception.   Social History:  Social History    Socioeconomic History  . Marital status: Single    Spouse name: Not on file  . Number of children: Not on file  . Years of education: Not on file  . Highest education level: Not on file  Occupational History  . Not on file  Tobacco Use  . Smoking status: Current Some Day Smoker    Packs/day: 0.25    Types: Cigarettes  . Smokeless tobacco: Never Used  . Tobacco comment: smoking less 5-6  cigarettes aday  Vaping Use  . Vaping Use: Never used  Substance and Sexual Activity  . Alcohol use: Yes    Alcohol/week: 0.0 standard drinks    Comment: occ  . Drug use: Yes    Types: Marijuana  . Sexual activity: Yes    Birth control/protection: None  Other Topics Concern  . Not on file  Social History Narrative  . Not on file   Social Determinants of Health   Financial Resource Strain:   . Difficulty of Paying Living Expenses:   Food Insecurity:   . Worried About Programme researcher, broadcasting/film/video in the Last Year:   . Barista in the Last Year:   Transportation Needs:   . Freight forwarder (Medical):   Marland Kitchen Lack of Transportation (Non-Medical):   Physical Activity:   . Days of Exercise per Week:   . Minutes of Exercise per Session:   Stress:   . Feeling of Stress :  Social Connections:   . Frequency of Communication with Friends and Family:   . Frequency of Social Gatherings with Friends and Family:   . Attends Religious Services:   . Active Member of Clubs or Organizations:   . Attends Banker Meetings:   Marland Kitchen Marital Status:   Intimate Partner Violence:   . Fear of Current or Ex-Partner:   . Emotionally Abused:   Marland Kitchen Physically Abused:   . Sexually Abused:     Family History:  Family History  Problem Relation Age of Onset  . Other Other        denies family h/o clotting d/o  . Asthma Maternal Grandfather   . Hypertension Maternal Grandfather   . Healthy Mother   . Healthy Father     Medications Delight Bickle. Wright-Madkins had no medications  administered during this visit. Current Outpatient Medications  Medication Sig Dispense Refill  . albuterol (PROVENTIL) (2.5 MG/3ML) 0.083% nebulizer solution Take 3 mLs (2.5 mg total) by nebulization every 6 (six) hours as needed for wheezing or shortness of breath. 75 mL 12  . pantoprazole (PROTONIX) 40 MG tablet Take 1 tablet (40 mg total) by mouth daily. 30 tablet 2  . albuterol (VENTOLIN HFA) 108 (90 Base) MCG/ACT inhaler Inhale 2 puffs into the lungs every 4 (four) hours as needed for wheezing or shortness of breath. (Patient not taking: Reported on 04/02/2020) 18 g 0  . budesonide-formoterol (SYMBICORT) 80-4.5 MCG/ACT inhaler Inhale 2 puffs into the lungs daily. (Patient not taking: Reported on 04/02/2020) 1 Inhaler 7   No current facility-administered medications for this visit.    Allergies Fish allergy   Physical Exam:  BP (!) 130/91 (BP Location: Right Leg)   Pulse 92   Ht 5\' 2"  (1.575 m)   Wt 176 lb 6.4 oz (80 kg)   LMP 04/22/2020 (Exact Date)   BMI 32.26 kg/m  Body mass index is 32.26 kg/m. General appearance: Well nourished, well developed female in no acute distress.  Cardiovascular: normal s1 and s2.  No murmurs, rubs or gallops. Respiratory:  Clear to auscultation bilateral. Normal respiratory effort. Pt points to just below xiphoid where her pain was. Abdomen: positive bowel sounds and no masses, hernias; diffusely non tender to palpation, non distended Neuro/Psych:  Normal mood and affect.  Skin:  Warm and dry.  Lymphatic:  No inguinal lymphadenopathy.   Pelvic exam: is limited by body habitus EGBUS: within normal limits Patient very intolerant of exam. Unable to see cervix or really visualize vagina. Bimanual deferred  Laboratory: neg beta hcg, cbc, lipase CMP Latest Ref Rng & Units 04/02/2020 03/15/2020 11/10/2019  Glucose 70 - 99 mg/dL 99 84 96  BUN 6 - 20 mg/dL 7 9 8   Creatinine 0.44 - 1.00 mg/dL 11/12/2019 4.85  Sodium 135 - 145 mmol/L 137 139 139  Potassium  3.5 - 5.1 mmol/L 3.6 4.1 4.1  Chloride 98 - 111 mmol/L 102 100 100  CO2 22 - 32 mmol/L 25 22 22   Calcium 8.9 - 10.3 mg/dL 9.2 9.4 9.9  Total Protein 6.5 - 8.1 g/dL 6.8 6.8 6.9  Total Bilirubin 0.3 - 1.2 mg/dL 0.9 1.2 0.7  Alkaline Phos 38 - 126 U/L 71 82 102  AST 15 - 41 U/L 43(H) 39 33  ALT 0 - 44 U/L 25 27 26    Radiology:  Narrative & Impression  CLINICAL DATA:  Epigastric pain for few days  EXAM: CT ABDOMEN AND PELVIS WITHOUT CONTRAST  TECHNIQUE: Multidetector CT imaging  of the abdomen and pelvis was performed following the standard protocol without IV contrast.  COMPARISON:  None.  FINDINGS: Lower chest: No acute abnormality.  Hepatobiliary: No focal liver abnormality is seen. No gallstones, gallbladder wall thickening, or biliary dilatation.  Pancreas: Unremarkable. No pancreatic ductal dilatation or surrounding inflammatory changes.  Spleen: Normal in size without focal abnormality.  Adrenals/Urinary Tract: Adrenal glands are within normal limits. Kidneys demonstrate no renal calculi or urinary tract obstructive changes. The bladder is decompressed.  Stomach/Bowel: Appendix is within normal limits. No obstructive or inflammatory changes of the large or small bowel are seen. Stomach is decompressed.  Vascular/Lymphatic: No significant vascular findings are present. No enlarged abdominal or pelvic lymph nodes.  Reproductive: Uterus is within normal limits. Cystic changes are noted within the ovaries bilaterally but particularly on the left. Combination of cystic changes in the left ovary measures 6.3 x 4.2 cm.  Other: No abdominal wall hernia or abnormality. No abdominopelvic ascites.  Musculoskeletal: No acute or significant osseous findings.  IMPRESSION: Cystic changes within the ovaries bilaterally. The left ovary is considerably enlarged with cystic change but incompletely evaluated on this exam. Ultrasound of the pelvis may be helpful  for further evaluation. These changes are likely physiologic in nature.  No other focal abnormality is noted.   Electronically Signed   By: Alcide Clever M.D.   On: 04/02/2020 14:38     Assessment: pt stable  Plan:  1. Cyst of ovary, unspecified laterality I d/w her importance of follow up and need to get an early cycle u/s and may need surgical management.  - US PELVIC COMPLETE WITH TRANSVAGINAL; Future  2. History of pulmonary embolism  3. Obesity (BMI 30-39.9)  4. Cervical cancer screening BCCCP information given.   RTC after u/s  Cornelia Copa MD Attending Center for [facility])

## 2020-05-02 ENCOUNTER — Other Ambulatory Visit: Payer: Self-pay | Admitting: Obstetrics and Gynecology

## 2020-05-02 ENCOUNTER — Ambulatory Visit (HOSPITAL_COMMUNITY)
Admission: RE | Admit: 2020-05-02 | Discharge: 2020-05-02 | Disposition: A | Discharge status: Home / Self Care | Payer: Self-pay | Source: Ambulatory Visit | Attending: Obstetrics and Gynecology | Admitting: Obstetrics and Gynecology

## 2020-05-02 ENCOUNTER — Other Ambulatory Visit: Payer: Self-pay

## 2020-05-02 DIAGNOSIS — N83209 Unspecified ovarian cyst, unspecified side: Secondary | ICD-10-CM | POA: Insufficient documentation

## 2020-05-30 ENCOUNTER — Other Ambulatory Visit: Payer: Self-pay | Admitting: Obstetrics and Gynecology

## 2020-05-30 ENCOUNTER — Encounter (HOSPITAL_BASED_OUTPATIENT_CLINIC_OR_DEPARTMENT_OTHER): Payer: Self-pay | Admitting: Obstetrics and Gynecology

## 2020-05-30 ENCOUNTER — Other Ambulatory Visit: Payer: Self-pay

## 2020-05-30 ENCOUNTER — Ambulatory Visit (INDEPENDENT_AMBULATORY_CARE_PROVIDER_SITE_OTHER): Payer: Self-pay | Admitting: Obstetrics and Gynecology

## 2020-05-30 ENCOUNTER — Encounter: Payer: Self-pay | Admitting: Obstetrics and Gynecology

## 2020-05-30 VITALS — BP 117/89 | HR 83 | Ht 62.0 in | Wt 175.2 lb

## 2020-05-30 DIAGNOSIS — N83292 Other ovarian cyst, left side: Secondary | ICD-10-CM

## 2020-05-30 NOTE — Progress Notes (Signed)
Obstetrics and Gynecology Return Patient Evaluation  Appointment Date: 05/30/2020  OBGYN Clinic: [facility]  Primary Care Provider: Evlyn Kanner Chief Complaint:  Chief Complaint  Patient presents with  . Follow-up    History of Present Illness: Kara Haynes is a 29 y.o. African-American [id number] (Patient's last menstrual period was 05/16/2020 (approximate).), seen for the above chief complaint. Her past medical history is significant for h/o PE (likely due to CAP), BMI 30s, h/o LO cysts, asthma, h/o STIs, h/o slightly elevated AST.  Patient currently asymptomatic. Rpt imaging on 8/18 showed 7-8 cysts on u/s (see below).    Review of Systems: Pertinent items are noted in HPI.   Patient Active Problem List   Diagnosis Date Noted  . Ovarian cyst 04/27/2020  . Obesity (BMI 30-39.9) 04/27/2020  . Alcohol abuse 03/17/2020  . Hepatic steatosis 06/26/2016  . Tobacco abuse 06/26/2016  . History of pulmonary embolism 10/26/2015  . Marijuana use 10/26/2015     Past Medical History:  Past Medical History:  Diagnosis Date  . Asthma   . Asthma exacerbation 08/09/2014  . CAP (community acquired pneumonia) 2017  . Obesity (BMI 30-39.9)   . PE (pulmonary embolism) 2017   ?etiology due to CAP  . Seasonal allergies   . Tobacco abuse   . Trichomoniasis   . UTI (lower urinary tract infection)     Past Surgical History:  Past Surgical History:  Procedure Laterality Date  . NO PAST SURGERIES      Past Obstetrical History:  OB History  Gravida Para Term Preterm AB Living  0 0 0 0 0 0  SAB TAB Ectopic Multiple Live Births  0 0 0 0 0    Past Gynecological History: As per HPI. Periods: qmonth, regular, 5 days.  History of Pap Smear(s): Yes.   Last pap 2016, which was negative History of STI(s): Yes.   She is currently using no method for contraception.   Social History:  Social History   Socioeconomic History  .  Marital status: Single    Spouse name: Not on file  . Number of children: Not on file  . Years of education: Not on file  . Highest education level: Not on file  Occupational History  . Not on file  Tobacco Use  . Smoking status: Current Some Day Smoker    Packs/day: 0.25    Types: Cigarettes  . Smokeless tobacco: Never Used  . Tobacco comment: smoking less 5-6  cigarettes aday  Vaping Use  . Vaping Use: Never used  Substance and Sexual Activity  . Alcohol use: Yes    Alcohol/week: 0.0 standard drinks    Comment: occ  . Drug use: Yes    Types: Marijuana  . Sexual activity: Yes    Birth control/protection: None  Other Topics Concern  . Not on file  Social History Narrative  . Not on file   Social Determinants of Health   Financial Resource Strain:   . Difficulty of Paying Living Expenses: Not on file  Food Insecurity: No Food Insecurity  . Worried About Programme researcher, broadcasting/film/video in the Last Year: Never true  . Ran Out of Food in the Last Year: Never true  Transportation Needs: No Transportation Needs  . Lack of Transportation (Medical): No  . Lack of Transportation (Non-Medical): No  Physical Activity:   . Days of Exercise per Week: Not on file  . Minutes of Exercise per Session: Not on file  Stress:   .  Feeling of Stress : Not on file  Social Connections:   . Frequency of Communication with Friends and Family: Not on file  . Frequency of Social Gatherings with Friends and Family: Not on file  . Attends Religious Services: Not on file  . Active Member of Clubs or Organizations: Not on file  . Attends Banker Meetings: Not on file  . Marital Status: Not on file  Intimate Partner Violence:   . Fear of Current or Ex-Partner: Not on file  . Emotionally Abused: Not on file  . Physically Abused: Not on file  . Sexually Abused: Not on file    Family History:  Family History  Problem Relation Age of Onset  . Other Other        denies family h/o clotting d/o   . Asthma Maternal Grandfather   . Hypertension Maternal Grandfather   . Healthy Mother   . Healthy Father     Medications Kara Haynes had no medications administered during this visit. Current Outpatient Medications  Medication Sig Dispense Refill  . albuterol (PROVENTIL) (2.5 MG/3ML) 0.083% nebulizer solution Take 3 mLs (2.5 mg total) by nebulization every 6 (six) hours as needed for wheezing or shortness of breath. 75 mL 12  . pantoprazole (PROTONIX) 40 MG tablet Take 1 tablet (40 mg total) by mouth daily. 30 tablet 2  . albuterol (VENTOLIN HFA) 108 (90 Base) MCG/ACT inhaler Inhale 2 puffs into the lungs every 4 (four) hours as needed for wheezing or shortness of breath. (Patient not taking: Reported on 04/02/2020) 18 g 0  . budesonide-formoterol (SYMBICORT) 80-4.5 MCG/ACT inhaler Inhale 2 puffs into the lungs daily. (Patient not taking: Reported on 04/02/2020) 1 Inhaler 7   No current facility-administered medications for this visit.    Allergies Fish allergy   Physical Exam:  BP 117/89 (BP Location: Left Arm)   Pulse 83   Ht 5\' 2"  (1.575 m)   Wt 175 lb 3.2 oz (79.5 kg)   LMP 05/16/2020 (Approximate)   BMI 32.04 kg/m  Body mass index is 32.04 kg/m. General appearance: Well nourished, well developed female in no acute distress.  Abdomen: soft, nttp, nd Neuro/Psych:  Normal mood and affect.  Skin:  Warm and dry.   Laboratory: none  Radiology:  Narrative & Impression  CLINICAL DATA:  History of ovarian cyst; LMP 04/22/2020  EXAM: TRANSABDOMINAL ULTRASOUND OF PELVIS  TECHNIQUE: Transabdominal ultrasound examination of the pelvis was performed including evaluation of the uterus, ovaries, adnexal regions, and pelvic cul-de-sac. Patient refused transvaginal imaging.  COMPARISON:  None  FINDINGS: Uterus  Measurements: 6.4 x 3.0 x 4.0 cm = volume: 40 mL. Anteverted. Normal morphology without mass  Endometrium  Thickness: 12 mm.  No  endometrial fluid or focal abnormality  Right ovary  Measurements: 4.6 x 2.8 x 2.6 cm = volume: 17.3 mL. Suboptimally visualized due to bowel. No gross evidence of mass.  Left ovary  Measurements: 7.7 x 4.8 x 7.4 cm = volume: 141 mL. Abnormal appearance, enlarged containing multiple cystic locules with several intervening septations. One of the observed septations is thick/irregular. Scattered internal echogenicity within several of the loculations. Largest loculation measures 5.1 cm in greatest size. No definite internal blood flow on color Doppler imaging.  Other findings:  No free pelvic fluid or other adnexal masses.  IMPRESSION: Unremarkable uterus, endometrial complex and RIGHT ovary.  Complex multiloculated cystic mass of the LEFT ovary with several septations, 1 of which is slightly thick/irregular, and scattered internal echoes.  Lesion is incompletely assessed due the lack of transvaginal imaging.  Recommend either further characterization by MR or surgical evaluation to exclude cystic ovarian neoplasm.  These results will be called to the ordering clinician or representative by the Radiologist Assistant, and communication documented in the PACS or [employer].   Electronically Signed   By: Ulyses Southward M.D.   On: 05/02/2020 15:04   Narrative & Impression  CLINICAL DATA:  Epigastric pain for few days  EXAM: CT ABDOMEN AND PELVIS WITHOUT CONTRAST  TECHNIQUE: Multidetector CT imaging of the abdomen and pelvis was performed following the standard protocol without IV contrast.  COMPARISON:  None.  FINDINGS: Lower chest: No acute abnormality.  Hepatobiliary: No focal liver abnormality is seen. No gallstones, gallbladder wall thickening, or biliary dilatation.  Pancreas: Unremarkable. No pancreatic ductal dilatation or surrounding inflammatory changes.  Spleen: Normal in size without focal abnormality.  Adrenals/Urinary Tract:  Adrenal glands are within normal limits. Kidneys demonstrate no renal calculi or urinary tract obstructive changes. The bladder is decompressed.  Stomach/Bowel: Appendix is within normal limits. No obstructive or inflammatory changes of the large or small bowel are seen. Stomach is decompressed.  Vascular/Lymphatic: No significant vascular findings are present. No enlarged abdominal or pelvic lymph nodes.  Reproductive: Uterus is within normal limits. Cystic changes are noted within the ovaries bilaterally but particularly on the left. Combination of cystic changes in the left ovary measures 6.3 x 4.2 cm.  Other: No abdominal wall hernia or abnormality. No abdominopelvic ascites.  Musculoskeletal: No acute or significant osseous findings.  IMPRESSION: Cystic changes within the ovaries bilaterally. The left ovary is considerably enlarged with cystic change but incompletely evaluated on this exam. Ultrasound of the pelvis may be helpful for further evaluation. These changes are likely physiologic in nature.  No other focal abnormality is noted.   Electronically Signed   By: Alcide Clever M.D.   On: 04/02/2020 14:38   Assessment: pt stable  Plan:  1. Complex cyst of left ovary Unlikely to be malignancy, but I still do recommend surgical evaluation with laparoscopic left ovarian cyst removal. D/w her possibility of having to remove the left ovary but will try to preserve. Will get a CA125 today to see if needs gyn onc referral.  - CA 125  Orders Placed This Encounter  Procedures  . CA 125    RTC post op  Cornelia Copa MD Attending Center for [employer] [facility])

## 2020-05-31 ENCOUNTER — Encounter (HOSPITAL_BASED_OUTPATIENT_CLINIC_OR_DEPARTMENT_OTHER): Payer: Self-pay | Admitting: Obstetrics and Gynecology

## 2020-05-31 ENCOUNTER — Other Ambulatory Visit: Payer: Self-pay

## 2020-05-31 LAB — CA 125: Cancer Antigen (CA) 125: 42.3 U/mL — ABNORMAL HIGH (ref 0.0–38.1)

## 2020-05-31 NOTE — Progress Notes (Signed)
Spoke w/ via phone for pre-op interview--- PT Lab needs dos---- Urine preg              Lab results------ pt getting CBC, CMP done 06-01-2020 @ 1100 COVID test ------ 06-01-2020 @ 1005 Arrive at ------- 0530 NPO after MN NO Solid Food.  Clear liquids from MN until--- 0430 Medications to take morning of surgery ----- Protonix Diabetic medication ----- n/a Patient Special Instructions ----- asked pt to bring rescue inhaler dos and do nebulizer night before surgery. Reviewed RCC and visitor guidelines. Pre-Op special Istructions ----- n/a Patient verbalized understanding of instructions that were given at this phone interview. Patient denies shortness of breath, chest pain, fever, cough at this phone interview.

## 2020-06-01 ENCOUNTER — Other Ambulatory Visit (HOSPITAL_COMMUNITY)
Admission: RE | Admit: 2020-06-01 | Discharge: 2020-06-01 | Disposition: A | Discharge status: Home / Self Care | Payer: Self-pay | Source: Ambulatory Visit | Attending: Obstetrics and Gynecology | Admitting: Obstetrics and Gynecology

## 2020-06-01 ENCOUNTER — Encounter (HOSPITAL_COMMUNITY)
Admission: RE | Admit: 2020-06-01 | Discharge: 2020-06-01 | Disposition: A | Discharge status: Home / Self Care | Payer: Self-pay | Source: Ambulatory Visit | Attending: Obstetrics and Gynecology | Admitting: Obstetrics and Gynecology

## 2020-06-01 DIAGNOSIS — Z01812 Encounter for preprocedural laboratory examination: Secondary | ICD-10-CM | POA: Insufficient documentation

## 2020-06-01 DIAGNOSIS — Z20822 Contact with and (suspected) exposure to covid-19: Secondary | ICD-10-CM | POA: Insufficient documentation

## 2020-06-01 LAB — COMPREHENSIVE METABOLIC PANEL
ALT: 24 U/L (ref 0–44)
AST: 40 U/L (ref 15–41)
Albumin: 3.7 g/dL (ref 3.5–5.0)
Alkaline Phosphatase: 68 U/L (ref 38–126)
Anion gap: 14 (ref 5–15)
BUN: 10 mg/dL (ref 6–20)
CO2: 23 mmol/L (ref 22–32)
Calcium: 8.9 mg/dL (ref 8.9–10.3)
Chloride: 104 mmol/L (ref 98–111)
Creatinine, Ser: 0.86 mg/dL (ref 0.44–1.00)
GFR calc Af Amer: >60 mL/min (ref >60)
GFR calc non Af Amer: >60 mL/min (ref >60)
Glucose, Bld: 81 mg/dL (ref 70–99)
Potassium: 3.7 mmol/L (ref 3.5–5.1)
Sodium: 141 mmol/L (ref 135–145)
Total Bilirubin: 1.3 mg/dL — ABNORMAL HIGH (ref 0.3–1.2)
Total Protein: 6.8 g/dL (ref 6.5–8.1)

## 2020-06-01 LAB — CBC
HCT: 41.9 % (ref 36.0–46.0)
Hemoglobin: 14.4 g/dL (ref 12.0–15.0)
MCH: 34.1 pg — ABNORMAL HIGH (ref 26.0–34.0)
MCHC: 34.4 g/dL (ref 30.0–36.0)
MCV: 99.3 fL (ref 80.0–100.0)
Platelets: 310 10*3/uL (ref 150–400)
RBC: 4.22 MIL/uL (ref 3.87–5.11)
RDW: 14.8 % (ref 11.5–15.5)
WBC: 3.3 10*3/uL — ABNORMAL LOW (ref 4.0–10.5)
nRBC: 0 % (ref 0.0–0.2)

## 2020-06-01 LAB — SARS CORONAVIRUS 2 (TAT 6-24 HRS): SARS Coronavirus 2: NEGATIVE

## 2020-06-04 NOTE — Anesthesia Preprocedure Evaluation (Addendum)
Anesthesia Evaluation  Patient identified by MRN, date of birth, ID band Patient awake    Reviewed: Allergy & Precautions, NPO status , Patient's Chart, lab work & pertinent test results  Airway Mallampati: II  TM Distance: >3 FB Neck ROM: Full    Dental  (+) Teeth Intact, Dental Advisory Given   Pulmonary asthma , Current Smoker and Patient abstained from smoking.,  H/o PE   Pulmonary exam normal breath sounds clear to auscultation       Cardiovascular negative cardio ROS Normal cardiovascular exam Rhythm:Regular Rate:Normal     Neuro/Psych negative neurological ROS     GI/Hepatic GERD  Medicated,(+)     substance abuse  alcohol use,   Endo/Other  Obesity   Renal/GU negative Renal ROS     Musculoskeletal negative musculoskeletal ROS (+)   Abdominal   Peds  Hematology negative hematology ROS (+)   Anesthesia Other Findings   Reproductive/Obstetrics  Left Ovarian Cyst                           Anesthesia Physical Anesthesia Plan  ASA: II  Anesthesia Plan: General   Post-op Pain Management:    Induction: Intravenous  PONV Risk Score and Plan: 3 and Midazolam, Dexamethasone and Ondansetron  Airway Management Planned: Oral ETT  Additional Equipment:   Intra-op Plan:   Post-operative Plan: Extubation in OR  Informed Consent: I have reviewed the patients History and Physical, chart, labs and discussed the procedure including the risks, benefits and alternatives for the proposed anesthesia with the patient or authorized representative who has indicated his/her understanding and acceptance.       Plan Discussed with: CRNA  Anesthesia Plan Comments:        Anesthesia Quick Evaluation

## 2020-06-05 ENCOUNTER — Other Ambulatory Visit: Payer: Self-pay

## 2020-06-05 ENCOUNTER — Encounter (HOSPITAL_BASED_OUTPATIENT_CLINIC_OR_DEPARTMENT_OTHER)
Admission: RE | Disposition: A | Discharge status: Home / Self Care | Payer: Self-pay | Source: Home / Self Care | Attending: Obstetrics and Gynecology

## 2020-06-05 ENCOUNTER — Encounter (HOSPITAL_BASED_OUTPATIENT_CLINIC_OR_DEPARTMENT_OTHER): Payer: Self-pay | Admitting: Obstetrics and Gynecology

## 2020-06-05 ENCOUNTER — Other Ambulatory Visit: Payer: Self-pay | Admitting: Obstetrics and Gynecology

## 2020-06-05 ENCOUNTER — Ambulatory Visit (HOSPITAL_BASED_OUTPATIENT_CLINIC_OR_DEPARTMENT_OTHER)
Admission: RE | Admit: 2020-06-05 | Discharge: 2020-06-05 | Disposition: A | Discharge status: Home / Self Care | Payer: Self-pay | Attending: Obstetrics and Gynecology | Admitting: Obstetrics and Gynecology

## 2020-06-05 ENCOUNTER — Ambulatory Visit (HOSPITAL_BASED_OUTPATIENT_CLINIC_OR_DEPARTMENT_OTHER): Payer: Self-pay | Admitting: Anesthesiology

## 2020-06-05 DIAGNOSIS — J4520 Mild intermittent asthma, uncomplicated: Secondary | ICD-10-CM | POA: Insufficient documentation

## 2020-06-05 DIAGNOSIS — N83292 Other ovarian cyst, left side: Secondary | ICD-10-CM

## 2020-06-05 DIAGNOSIS — N736 Female pelvic peritoneal adhesions (postinfective): Secondary | ICD-10-CM | POA: Insufficient documentation

## 2020-06-05 DIAGNOSIS — F17210 Nicotine dependence, cigarettes, uncomplicated: Secondary | ICD-10-CM | POA: Insufficient documentation

## 2020-06-05 DIAGNOSIS — E669 Obesity, unspecified: Secondary | ICD-10-CM | POA: Insufficient documentation

## 2020-06-05 DIAGNOSIS — Z86711 Personal history of pulmonary embolism: Secondary | ICD-10-CM | POA: Insufficient documentation

## 2020-06-05 DIAGNOSIS — Z9889 Other specified postprocedural states: Secondary | ICD-10-CM

## 2020-06-05 DIAGNOSIS — N83202 Unspecified ovarian cyst, left side: Secondary | ICD-10-CM | POA: Insufficient documentation

## 2020-06-05 DIAGNOSIS — Z6831 Body mass index (BMI) 31.0-31.9, adult: Secondary | ICD-10-CM | POA: Insufficient documentation

## 2020-06-05 HISTORY — DX: Personal history of other infectious and parasitic diseases: Z86.19

## 2020-06-05 HISTORY — PX: LAPAROSCOPIC OVARIAN CYSTECTOMY: SHX6248

## 2020-06-05 HISTORY — DX: Mild intermittent asthma, uncomplicated: J45.20

## 2020-06-05 HISTORY — DX: Dermatitis, unspecified: L30.9

## 2020-06-05 HISTORY — DX: Other seasonal allergic rhinitis: J30.2

## 2020-06-05 HISTORY — DX: Dyspnea, unspecified: R06.00

## 2020-06-05 HISTORY — DX: Gastro-esophageal reflux disease without esophagitis: K21.9

## 2020-06-05 HISTORY — DX: Alcohol abuse, uncomplicated: F10.10

## 2020-06-05 HISTORY — DX: Unspecified ovarian cyst, left side: N83.202

## 2020-06-05 LAB — POCT PREGNANCY, URINE: Preg Test, Ur: NEGATIVE

## 2020-06-05 SURGERY — EXCISION, CYST, OVARY, LAPAROSCOPIC
Anesthesia: General | Site: Abdomen | Laterality: Left

## 2020-06-05 MED ORDER — MIDAZOLAM HCL 2 MG/2ML IJ SOLN
INTRAMUSCULAR | Status: AC
  Filled 2020-06-05: qty 2

## 2020-06-05 MED ORDER — DEXMEDETOMIDINE (PRECEDEX) IN NS 20 MCG/5ML (4 MCG/ML) IV SYRINGE
PREFILLED_SYRINGE | INTRAVENOUS | Status: DC | PRN
  Administered 2020-06-05 (×4): 4 ug via INTRAVENOUS

## 2020-06-05 MED ORDER — LACTATED RINGERS IV SOLN: INTRAVENOUS | Status: DC

## 2020-06-05 MED ORDER — ONDANSETRON HCL 4 MG/2ML IJ SOLN
INTRAMUSCULAR | Status: AC
  Filled 2020-06-05: qty 2

## 2020-06-05 MED ORDER — ROCURONIUM BROMIDE 10 MG/ML (PF) SYRINGE
PREFILLED_SYRINGE | INTRAVENOUS | Status: DC | PRN
  Administered 2020-06-05: 40 mg via INTRAVENOUS

## 2020-06-05 MED ORDER — OXYCODONE HCL 5 MG PO TABS
ORAL_TABLET | ORAL | Status: AC
  Filled 2020-06-05: qty 1

## 2020-06-05 MED ORDER — ROCURONIUM BROMIDE 10 MG/ML (PF) SYRINGE
PREFILLED_SYRINGE | INTRAVENOUS | Status: AC
  Filled 2020-06-05: qty 10

## 2020-06-05 MED ORDER — LIDOCAINE 2% (20 MG/ML) 5 ML SYRINGE
INTRAMUSCULAR | Status: AC
  Filled 2020-06-05: qty 5

## 2020-06-05 MED ORDER — SUGAMMADEX SODIUM 200 MG/2ML IV SOLN
INTRAVENOUS | Status: DC | PRN
  Administered 2020-06-05: 175 mg via INTRAVENOUS

## 2020-06-05 MED ORDER — ACETAMINOPHEN 500 MG PO TABS
1000.0000 mg | ORAL_TABLET | Freq: Once | ORAL | Status: AC
  Administered 2020-06-05: 1000 mg via ORAL

## 2020-06-05 MED ORDER — DEXAMETHASONE SODIUM PHOSPHATE 10 MG/ML IJ SOLN
INTRAMUSCULAR | Status: DC | PRN
  Administered 2020-06-05: 10 mg via INTRAVENOUS

## 2020-06-05 MED ORDER — DOCUSATE SODIUM 100 MG PO CAPS: 100.0000 mg | ORAL_CAPSULE | Freq: Two times a day (BID) | ORAL | 1 refills | Status: DC | PRN

## 2020-06-05 MED ORDER — ACETAMINOPHEN 500 MG PO TABS
ORAL_TABLET | ORAL | Status: AC
  Filled 2020-06-05: qty 2

## 2020-06-05 MED ORDER — FENTANYL CITRATE (PF) 100 MCG/2ML IJ SOLN
INTRAMUSCULAR | Status: DC | PRN
Start: 2020-06-05 — End: 2020-06-05
  Administered 2020-06-05 (×2): 50 ug via INTRAVENOUS

## 2020-06-05 MED ORDER — GLYCOPYRROLATE PF 0.2 MG/ML IJ SOSY
PREFILLED_SYRINGE | INTRAMUSCULAR | Status: AC
  Filled 2020-06-05: qty 1

## 2020-06-05 MED ORDER — GLYCOPYRROLATE 0.2 MG/ML IJ SOLN
INTRAMUSCULAR | Status: DC | PRN
  Administered 2020-06-05: .2 mg via INTRAVENOUS

## 2020-06-05 MED ORDER — OXYCODONE HCL 5 MG PO TABS
5.0000 mg | ORAL_TABLET | ORAL | Status: AC | PRN
  Administered 2020-06-05: 5 mg via ORAL

## 2020-06-05 MED ORDER — OXYCODONE-ACETAMINOPHEN 5-325 MG PO TABS: 1.0000 | ORAL_TABLET | Freq: Four times a day (QID) | ORAL | 0 refills | Status: DC | PRN

## 2020-06-05 MED ORDER — IBUPROFEN 600 MG PO TABS: 600.0000 mg | ORAL_TABLET | Freq: Four times a day (QID) | ORAL | 1 refills | Status: DC | PRN

## 2020-06-05 MED ORDER — SILVER NITRATE-POT NITRATE 75-25 % EX MISC
CUTANEOUS | Status: DC | PRN
  Administered 2020-06-05: 4

## 2020-06-05 MED ORDER — FENTANYL CITRATE (PF) 100 MCG/2ML IJ SOLN
25.0000 ug | INTRAMUSCULAR | Status: DC | PRN
  Administered 2020-06-05: 25 ug via INTRAVENOUS

## 2020-06-05 MED ORDER — PROPOFOL 10 MG/ML IV BOLUS
INTRAVENOUS | Status: AC
  Filled 2020-06-05: qty 20

## 2020-06-05 MED ORDER — PROMETHAZINE HCL 25 MG/ML IJ SOLN: 6.2500 mg | INTRAMUSCULAR | Status: DC | PRN

## 2020-06-05 MED ORDER — ONDANSETRON HCL 4 MG/2ML IJ SOLN
INTRAMUSCULAR | Status: DC | PRN
  Administered 2020-06-05: 4 mg via INTRAVENOUS

## 2020-06-05 MED ORDER — FENTANYL CITRATE (PF) 100 MCG/2ML IJ SOLN
INTRAMUSCULAR | Status: AC
  Filled 2020-06-05: qty 2

## 2020-06-05 MED ORDER — DEXMEDETOMIDINE (PRECEDEX) IN NS 20 MCG/5ML (4 MCG/ML) IV SYRINGE
PREFILLED_SYRINGE | INTRAVENOUS | Status: AC
  Filled 2020-06-05: qty 5

## 2020-06-05 MED ORDER — SODIUM CHLORIDE 0.9 % IR SOLN
Status: DC | PRN
  Administered 2020-06-05: 500 mL

## 2020-06-05 MED ORDER — DEXAMETHASONE SODIUM PHOSPHATE 10 MG/ML IJ SOLN
INTRAMUSCULAR | Status: AC
  Filled 2020-06-05: qty 1

## 2020-06-05 MED ORDER — BUPIVACAINE HCL 0.5 % IJ SOLN
INTRAMUSCULAR | Status: DC | PRN
  Administered 2020-06-05: 30 mL

## 2020-06-05 MED ORDER — MIDAZOLAM HCL 5 MG/5ML IJ SOLN
INTRAMUSCULAR | Status: DC | PRN
  Administered 2020-06-05: 2 mg via INTRAVENOUS

## 2020-06-05 MED ORDER — PROPOFOL 10 MG/ML IV BOLUS
INTRAVENOUS | Status: DC | PRN
  Administered 2020-06-05: 180 mg via INTRAVENOUS

## 2020-06-05 MED ORDER — LIDOCAINE 2% (20 MG/ML) 5 ML SYRINGE
INTRAMUSCULAR | Status: DC | PRN
  Administered 2020-06-05: 80 mg via INTRAVENOUS

## 2020-06-05 SURGICAL SUPPLY — 46 items
ADH SKN CLS APL DERMABOND .7 (GAUZE/BANDAGES/DRESSINGS) ×1
APL SWBSTK 6 STRL LF DISP (MISCELLANEOUS)
APPLICATOR COTTON TIP 6 STRL (MISCELLANEOUS) IMPLANT
APPLICATOR COTTON TIP 6IN STRL (MISCELLANEOUS)
BAG SPEC RTRVL LRG 6X4 10 (ENDOMECHANICALS)
BLADE SURG 15 STRL LF DISP TIS (BLADE) ×1 IMPLANT
BLADE SURG 15 STRL SS (BLADE) ×3
CABLE HIGH FREQUENCY MONO STRZ (ELECTRODE) IMPLANT
DEFOGGER SCOPE WARMER CLEARIFY (MISCELLANEOUS) ×3 IMPLANT
DERMABOND ADVANCED (GAUZE/BANDAGES/DRESSINGS) ×2
DERMABOND ADVANCED .7 DNX12 (GAUZE/BANDAGES/DRESSINGS) ×1 IMPLANT
DILATOR CANAL MILEX (MISCELLANEOUS) ×2 IMPLANT
DRSG OPSITE POSTOP 3X4 (GAUZE/BANDAGES/DRESSINGS) ×1 IMPLANT
DURAPREP 26ML APPLICATOR (WOUND CARE) ×3 IMPLANT
ELECT REM PT RETURN 9FT ADLT (ELECTROSURGICAL) ×3
ELECTRODE REM PT RTRN 9FT ADLT (ELECTROSURGICAL) ×1 IMPLANT
GLOVE BIOGEL PI IND STRL 7.0 (GLOVE) ×2 IMPLANT
GLOVE BIOGEL PI IND STRL 7.5 (GLOVE) ×2 IMPLANT
GLOVE BIOGEL PI INDICATOR 7.0 (GLOVE) ×4
GLOVE BIOGEL PI INDICATOR 7.5 (GLOVE) ×4
GLOVE SURG SS PI 7.0 STRL IVOR (GLOVE) ×3 IMPLANT
GOWN STRL REUS W/ TWL LRG LVL3 (GOWN DISPOSABLE) ×3 IMPLANT
GOWN STRL REUS W/TWL LRG LVL3 (GOWN DISPOSABLE) ×9
IV NS IRRIG 3000ML ARTHROMATIC (IV SOLUTION) ×2 IMPLANT
KIT TURNOVER CYSTO (KITS) ×3 IMPLANT
LIGASURE VESSEL 5MM BLUNT TIP (ELECTROSURGICAL) ×2 IMPLANT
NS IRRIG 1000ML POUR BTL (IV SOLUTION) ×1 IMPLANT
NS IRRIG 500ML POUR BTL (IV SOLUTION) ×2 IMPLANT
PACK LAPAROSCOPY BASIN (CUSTOM PROCEDURE TRAY) ×3 IMPLANT
PACK TRENDGUARD 450 HYBRID PRO (MISCELLANEOUS) IMPLANT
PAD OB MATERNITY 4.3X12.25 (PERSONAL CARE ITEMS) ×3 IMPLANT
POUCH LAPAROSCOPIC INSTRUMENT (MISCELLANEOUS) ×3 IMPLANT
POUCH SPECIMEN RETRIEVAL 10MM (ENDOMECHANICALS) IMPLANT
SCISSORS LAP 5X35 DISP (ENDOMECHANICALS) IMPLANT
SET IRRIG TUBING LAPAROSCOPIC (IRRIGATION / IRRIGATOR) ×3 IMPLANT
SET TUBE SMOKE EVAC HIGH FLOW (TUBING) ×3 IMPLANT
SLEEVE SCD COMPRESS KNEE MED (MISCELLANEOUS) ×3 IMPLANT
SUT MON AB 4-0 PS1 27 (SUTURE) ×3 IMPLANT
SUT VICRYL 0 UR6 27IN ABS (SUTURE) ×3 IMPLANT
TOWEL OR 17X26 10 PK STRL BLUE (TOWEL DISPOSABLE) ×4 IMPLANT
TRAP SPECIMEN MUCUS 40CC (MISCELLANEOUS) ×2 IMPLANT
TRAY FOLEY W/BAG SLVR 14FR LF (SET/KITS/TRAYS/PACK) ×3 IMPLANT
TRENDGUARD 450 HYBRID PRO PACK (MISCELLANEOUS) ×3
TROCAR ADV FIXATION 5X100MM (TROCAR) ×7 IMPLANT
TROCAR BALLN 12MMX100 BLUNT (TROCAR) ×3 IMPLANT
TROCAR XCEL NON-BLD 5MMX100MML (ENDOMECHANICALS) IMPLANT

## 2020-06-05 NOTE — Anesthesia Procedure Notes (Signed)
Procedure Name: Intubation Date/Time: 06/05/2020 7:32 AM Performed by: Yaquelin Langelier D, CRNA Pre-anesthesia Checklist: Patient identified, Emergency Drugs available, Suction available and Patient being monitored Patient Re-evaluated:Patient Re-evaluated prior to induction Oxygen Delivery Method: Circle system utilized Preoxygenation: Pre-oxygenation with 100% oxygen Induction Type: IV induction Ventilation: Mask ventilation without difficulty Laryngoscope Size: Mac and 3 Grade View: Grade II Tube type: Oral Tube size: 7.0 mm Number of attempts: 1 Airway Equipment and Method: Stylet and Oral airway Placement Confirmation: ETT inserted through vocal cords under direct vision,  positive ETCO2 and breath sounds checked- equal and bilateral Secured at: 21 cm Tube secured with: Tape Dental Injury: Teeth and Oropharynx as per pre-operative assessment

## 2020-06-05 NOTE — Anesthesia Postprocedure Evaluation (Signed)
Anesthesia Post Note  Patient: Kara Haynes  Procedure(s) Performed: LAPAROSCOPIC OVARIAN CYSTECTOMY (Left Abdomen)     Patient location during evaluation: PACU Anesthesia Type: General Level of consciousness: awake and alert, awake and oriented Pain management: pain level controlled Vital Signs Assessment: post-procedure vital signs reviewed and stable Respiratory status: spontaneous breathing, nonlabored ventilation and respiratory function stable Cardiovascular status: blood pressure returned to baseline and stable Postop Assessment: no apparent nausea or vomiting Anesthetic complications: no   No complications documented.  Last Vitals:  Vitals:   06/05/20 0945 06/05/20 1115  BP: 111/78   Pulse: 64 76  Resp: 18 18  Temp:  (!) 36.3 C  SpO2: 99% 100%    Last Pain:  Vitals:   06/05/20 1100  TempSrc:   PainSc: 4                  Cecile Hearing

## 2020-06-05 NOTE — Discharge Instructions (Addendum)
Post Anesthesia Home Care Instructions  Activity: Get plenty of rest for the remainder of the day. A responsible individual must stay with you for 24 hours following the procedure.  For the next 24 hours, DO NOT: -Drive a car -Advertising copywriter -Drink alcoholic beverages -Take any medication unless instructed by your physician -Make any legal decisions or sign important papers.  Meals: Start with liquid foods such as gelatin or soup. Progress to regular foods as tolerated. Avoid greasy, spicy, heavy foods. If nausea and/or vomiting occur, drink only clear liquids until the nausea and/or vomiting subsides. Call your physician if vomiting continues.  Special Instructions/Symptoms: Your throat may feel dry or sore from the anesthesia or the breathing tube placed in your throat during surgery. If this causes discomfort, gargle with warm salt water. The discomfort should disappear within 24 hours.  If you had a scopolamine patch placed behind your ear for the management of post- operative nausea and/or vomiting:  1. The medication in the patch is effective for 72 hours, after which it should be removed.  Wrap patch in a tissue and discard in the trash. Wash hands thoroughly with soap and water. 2. You may remove the patch earlier than 72 hours if you experience unpleasant side effects which may include dry mouth, dizziness or visual disturbances. 3. Avoid touching the patch. Wash your hands with soap and water after contact with the patch.    Laparoscopic Surgery Discharge Instructions  Instructions Following Laparoscopic Surgery You have just undergone a laparoscopic surgery.  The following list should answer your most common questions.  Although we will discuss your surgery and post-operative instructions with you prior to your discharge, this list will serve as a reminder if you fail to recall the details of what we discussed.  We will discuss your surgery once again in detail at your  post-op visit in two to four weeks. If you haven't already done so, please call to make your appointment as soon as possible.  How you will feel: Although you have just undergone a major surgery, your recovery will be significantly shorter since the surgery was performed through much smaller incisions than the traditional approach.  You should feel slightly better each day.  If you suddenly feel much worse than the prior day, please call the clinic.  It's important during the early part of your recovery that you maintain some activity.  Walking is encouraged.  You will quicken your recovery by continued activity.  Incision:  Your incisions will be closed with dissolvable stitches or surgical adhesive (glue).  There may be Band-aids and/or Steri-strips covering your incisions.  If there is no drainage from the incisions you may remove the Band-aids in one to two days.  You may notice some minor bruising at the incision sites.  This is common and will resolve within several days.  Please inform us if the redness at the edges of your incision appears to be spreading.  If the skin around your incision becomes warm to the touch, or if you notice a pus-like drainage, please call the office.  Stairs/Driving/Activities: You may climb stairs if necessary.  If you've had general anesthesia, do not drive a car the rest of the day today.  You may begin light housework when you feel up to it, but avoid heavy lifting (more than 15-20lbs) or pushing until cleared for these activities by your physician.  Hygiene:  Do not soak your incisions.  Showers are acceptable but you may not take  a bath or swim in a pool.  Cleanse your incisions daily with soap and water.  Medications:  Please resume taking any medications that you were taking prior to the surgery.  If we have prescribed any new medications for you, please take them as directed.  Constipation:  It is fairly common to experience some difficulty in moving your  bowels following major surgery.  Being active will help to reduce this likelihood. A diet rich in fiber and plenty of liquids is desirable.  If you do become constipated, a mild laxative such as Miralax, Milk of Magnesia, or Metamucil, or a stool softener such as Colace, is recommended.  General Instructions: If you develop a fever of 100.5 degrees or higher, please call the office number(s) below for physician on call.

## 2020-06-05 NOTE — Transfer of Care (Signed)
Immediate Anesthesia Transfer of Care Note  Patient: Kara Haynes  Procedure(s) Performed: LAPAROSCOPIC OVARIAN CYSTECTOMY (Left Abdomen)  Patient Location: PACU  Anesthesia Type:General  Level of Consciousness: awake, alert  and oriented  Airway & Oxygen Therapy: Patient Spontanous Breathing and Patient connected to nasal cannula oxygen  Post-op Assessment: Report given to RN and Post -op Vital signs reviewed and stable  Post vital signs: Reviewed and stable  Last Vitals:  Vitals Value Taken Time  BP 123/104 06/05/20 0857  Temp    Pulse 73 06/05/20 0858  Resp 20 06/05/20 0858  SpO2 100 % 06/05/20 0858  Vitals shown include unvalidated device data.  Last Pain:  Vitals:   06/05/20 0636  TempSrc: Oral  PainSc: 4       Patients Stated Pain Goal: 5 (06/05/20 0636)  Complications: No complications documented.

## 2020-06-05 NOTE — Op Note (Signed)
Operative Note   06/05/2020  PRE-OP DIAGNOSIS: 7-8cm complex left ovarian cyst on imaging. Abdominal pain   POST-OP DIAGNOSIS: Same. Concern for endometriosis vs borderline ovarian tumor. Abdominopelvic adhesions   SURGEON: Surgeon(s) and Role:    * Dalton Bing, MD - Primary  ASSISTANT:    Hermina Staggers, MD - Assisting  ANESTHESIA: General and local   PROCEDURE: Diagnostic laparoscopy, pelvic washings  ESTIMATED BLOOD LOSS: 74mL  DRAINS: indwelling foley UOP   TOTAL IV FLUIDS: per anesthesia note  SPECIMENS: pelvic washings   VTE PROPHYLAXIS: SCDs to the bilateral lower extremities  ANTIBIOTICS: not indicated  COMPLICATIONS: Severe pelvic adhesive disease and findings concerning for potential malignancy.   DISPOSITION: PACU - hemodynamically stable.  CONDITION: stable  FINDINGS: Exam under anesthesia revealed normal EGBUS and vagina. Cervix is stenotic and os finder needed to open to place hulka; I was unable to pass a sound through the cervix after using the os finder.  On diagnostic laparoscopy, normal omentum, liver and stomach edge. There was a 3-4cm area on an area of the small bowel that had brown/mucus like material on it with adhesions. In the pelvis the anterior uterus was normal but mobility of the uterus was minimal due to the large left adnexal complex; the right adnexa was unable to be visualized.  In the left adnexa, the fimbriae looked normal except for multiple, diffuse, subcentimeter yellow cysts. The left fallopian tube was engorged and it appeared to be 7-8cm in length x 4-5cm in width. It appeared to contained brown/black material and it was adhesed to the uterus and left lower pelvis with minimal mobility.  PROCEDURE IN DETAIL: The patient was taken to the OR where anesthesia was administed. The patient was positioned in dorsal lithotomy in the Babbie stirrups. The patient was then examined under anesthesia with the above noted findings. The  patient was prepped and draped in the normal sterile fashion and foley catheter was placed. A Graves speculum was placed in the vagina and the anterior lip of the cervix was grasped with a single toothed tenaculum.  A sound was unable to pass the os so an os finder was used. Sound still unable to pass but a Hulka uterine manipulator was able to and this was then inserted in the uterus; the speculum and tenaculum were then removed.  After changing gloves, attention was turned to the patient's abdomen where a 12 mm skin incision was made in the umbilical fold, after injection of local anesthesia. Using the open technique, the abdomen was entered and a 21mm balloon trocar placed.  Pneumoperitoneum was placed . The operative laparoscope was introduced into the abdomen with the above noted findings, after inspection of the entry site and then placing the patient in Trendelenburg. 29mm ports were then placed in the RLQ, LLQ and suprapubic area under direct visualization and after injection of local anesthesia. Pelvic washings were obtained and the above noted findings were seen. Given the findings on laparoscopy, the decision was made to abort the procedure and refer to GYN Oncology.   All instruments and ports were then removed from the abdomen under direct visualization. The fascia at the umbilical incision was reapproximated with 0 vicryl. The skin was closed with 4-0 monocryl. Dermabond was applied to all incision sites. The Hulka was removed with no bleeding noted from the cervix and all other instrumentation was removed from the vagina.  The foley catheter was removed. The patient tolerated the procedure well. All counts were correct  x 2. The patient was transferred to the recovery room awake, alert and breathing independently.   Cornelia Copa MD Attending Center for [employer] [facility])

## 2020-06-05 NOTE — H&P (Signed)
Obstetrics and Gynecology Surgical H&P  Surgery Date: 06/05/2020  OBGYN Clinic: [facility]  Primary Care Provider: Evlyn Kanner Chief Complaint:  Scheduled left ovarian cyst removal   History of Present Illness: Kara Haynes is a 29 y.o. African-American G0P0000 (Patient's last menstrual period was 05/16/2020 (approximate).), seen for the above chief complaint. Her past medical history is significant for h/o PE (likely due to CAP), BMI 30s, h/o LO cysts, asthma, h/o STIs, h/o slightly elevated AST.  Patient currently with abdominal pain. Rpt imaging on 8/18 showed 7-8 cysts on u/s (see below).    Review of Systems: Pertinent items are noted in HPI.   Patient Active Problem List   Diagnosis Date Noted  . Ovarian cyst 04/27/2020  . Obesity (BMI 30-39.9) 04/27/2020  . Alcohol abuse 03/17/2020  . Hepatic steatosis 06/26/2016  . Tobacco abuse 06/26/2016  . History of pulmonary embolism 10/26/2015  . Marijuana use 10/26/2015     Past Medical History:  Past Medical History:  Diagnosis Date  . Alcohol abuse   . Allergic rhinitis, seasonal   . Dyspnea    with asthma  . Eczema   . GERD (gastroesophageal reflux disease)   . 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 History of trichomoniasis   . Left ovarian cyst   . Mild intermittent asthma    followed by pcp   (05-31-2020  per pt last exacerbation 11-09-2019 ED visit)  . Obesity (BMI 30-39.9)     Past Surgical History:  Past Surgical History:  Procedure Laterality Date  . NO PAST SURGERIES      Past Obstetrical History:  OB History  Gravida Para Term Preterm AB Living  0 0 0 0 0 0  SAB TAB Ectopic Multiple Live Births  0 0 0 0 0    Past Gynecological History: As per HPI. Periods: qmonth, regular, 5 days.  History of Pap Smear(s): Yes.   Last pap 2016, which was negative History of  STI(s): Yes.   She is currently using no method for contraception.   Social History:  Social History   Socioeconomic History  . Marital status: Single    Spouse name: Not on file  . Number of children: Not on file  . Years of education: Not on file  . Highest education level: Not on file  Occupational History  . Not on file  Tobacco Use  . Smoking status: Current Every Day Smoker    Packs/day: 0.50    Years: 12.00    Pack years: 6.00    Types: Cigarettes  . Smokeless tobacco: Never Used  Vaping Use  . Vaping Use: Never used  Substance and Sexual Activity  . Alcohol use: Yes    Alcohol/week: 0.0 standard drinks    Comment: 05-31-2020  per pt  5 shots  vodka per day  . Drug use: Yes    Types: Marijuana  . Sexual activity: Yes    Birth control/protection: None  Other Topics Concern  . Not on file  Social History Narrative  . Not on file   Social Determinants of Health   Financial Resource Strain:   . Difficulty of Paying Living Expenses: Not on file  Food Insecurity: No Food Insecurity  . Worried About Programme researcher, broadcasting/film/video in the Last Year: Never true  . Ran Out of Food in the Last Year: Never true  Transportation Needs: No  Transportation Needs  . Lack of Transportation (Medical): No  . Lack of Transportation (Non-Medical): No  Physical Activity:   . Days of Exercise per Week: Not on file  . Minutes of Exercise per Session: Not on file  Stress:   . Feeling of Stress : Not on file  Social Connections:   . Frequency of Communication with Friends and Family: Not on file  . Frequency of Social Gatherings with Friends and Family: Not on file  . Attends Religious Services: Not on file  . Active Member of Clubs or Organizations: Not on file  . Attends BankerClub or Organization Meetings: Not on file  . Marital Status: Not on file  Intimate Partner Violence:   . Fear of Current or Ex-Partner: Not on file  . Emotionally Abused: Not on file  . Physically Abused: Not on file   . Sexually Abused: Not on file    Family History:  Family History  Problem Relation Age of Onset  . Other Other        denies family h/o clotting d/o  . Asthma Maternal Grandfather   . Hypertension Maternal Grandfather   . Healthy Mother   . Healthy Father     Medications Kara Haynes had no medications administered during this visit. Current Facility-Administered Medications  Medication Dose Route Frequency Provider Last Rate Last Admin  . lactated ringers infusion   Intravenous Continuous Trevor IhaHouser, Stephen A, MD 50 mL/hr at 06/05/20 0650 New Bag at 06/05/20 0650  . lactated ringers infusion   Intravenous Continuous [place] BingPickens, Geovanna Simko, MD        Allergies Fish allergy   Physical Exam:  BP (!) 128/92   Pulse 78   Temp 98.9 F (37.2 C) (Oral)   Resp 16   Ht 5\' 2"  (1.575 m)   Wt 78.1 kg   LMP 05/16/2020 (Approximate)   SpO2 100%   BMI 31.48 kg/m  Body mass index is 31.48 kg/m. General appearance: Well nourished, well developed female in no acute distress.  Abdomen: soft, nttp, nd CV: normal s1 and s2, no MRGs Pulmonary: CTAB Neuro/Psych:  Normal mood and affect.  Skin:  Warm and dry.   Laboratory: none  Radiology:  Narrative & Impression  CLINICAL DATA:  History of ovarian cyst; LMP 04/22/2020  EXAM: TRANSABDOMINAL ULTRASOUND OF PELVIS  TECHNIQUE: Transabdominal ultrasound examination of the pelvis was performed including evaluation of the uterus, ovaries, adnexal regions, and pelvic cul-de-sac. Patient refused transvaginal imaging.  COMPARISON:  None  FINDINGS: Uterus  Measurements: 6.4 x 3.0 x 4.0 cm = volume: 40 mL. Anteverted. Normal morphology without mass  Endometrium  Thickness: 12 mm.  No endometrial fluid or focal abnormality  Right ovary  Measurements: 4.6 x 2.8 x 2.6 cm = volume: 17.3 mL. Suboptimally visualized due to bowel. No gross evidence of mass.  Left ovary  Measurements: 7.7 x 4.8 x 7.4 cm =  volume: 141 mL. Abnormal appearance, enlarged containing multiple cystic locules with several intervening septations. One of the observed septations is thick/irregular. Scattered internal echogenicity within several of the loculations. Largest loculation measures 5.1 cm in greatest size. No definite internal blood flow on color Doppler imaging.  Other findings:  No free pelvic fluid or other adnexal masses.  IMPRESSION: Unremarkable uterus, endometrial complex and RIGHT ovary.  Complex multiloculated cystic mass of the LEFT ovary with several septations, 1 of which is slightly thick/irregular, and scattered internal echoes.  Lesion is incompletely assessed due the lack of transvaginal imaging.  Recommend  either further characterization by MR or surgical evaluation to exclude cystic ovarian neoplasm.  These results will be called to the ordering clinician or representative by the Radiologist Assistant, and communication documented in the PACS or [employer].   Electronically Signed   By: Ulyses Southward M.D.   On: 05/02/2020 15:04   Narrative & Impression  CLINICAL DATA:  Epigastric pain for few days  EXAM: CT ABDOMEN AND PELVIS WITHOUT CONTRAST  TECHNIQUE: Multidetector CT imaging of the abdomen and pelvis was performed following the standard protocol without IV contrast.  COMPARISON:  None.  FINDINGS: Lower chest: No acute abnormality.  Hepatobiliary: No focal liver abnormality is seen. No gallstones, gallbladder wall thickening, or biliary dilatation.  Pancreas: Unremarkable. No pancreatic ductal dilatation or surrounding inflammatory changes.  Spleen: Normal in size without focal abnormality.  Adrenals/Urinary Tract: Adrenal glands are within normal limits. Kidneys demonstrate no renal calculi or urinary tract obstructive changes. The bladder is decompressed.  Stomach/Bowel: Appendix is within normal limits. No obstructive  or inflammatory changes of the large or small bowel are seen. Stomach is decompressed.  Vascular/Lymphatic: No significant vascular findings are present. No enlarged abdominal or pelvic lymph nodes.  Reproductive: Uterus is within normal limits. Cystic changes are noted within the ovaries bilaterally but particularly on the left. Combination of cystic changes in the left ovary measures 6.3 x 4.2 cm.  Other: No abdominal wall hernia or abnormality. No abdominopelvic ascites.  Musculoskeletal: No acute or significant osseous findings.  IMPRESSION: Cystic changes within the ovaries bilaterally. The left ovary is considerably enlarged with cystic change but incompletely evaluated on this exam. Ultrasound of the pelvis may be helpful for further evaluation. These changes are likely physiologic in nature.  No other focal abnormality is noted.   Electronically Signed   By: Alcide Clever M.D.   On: 04/02/2020 14:38   Labs: Negative UPT, COVID CA125 42.3 (normal)  CBC Latest Ref Rng & Units 06/01/2020 04/02/2020 05/27/2019  WBC 4.0 - 10.5 K/uL 3.3(L) 5.3 5.0  Hemoglobin 12.0 - 15.0 g/dL 58.5 27.7 82.4  Hematocrit 36 - 46 % 41.9 40.9 41.8  Platelets 150 - 400 K/uL 310 378 259   CMP Latest Ref Rng & Units 06/01/2020 04/02/2020 03/15/2020  Glucose 70 - 99 mg/dL 81 99 84  BUN 6 - 20 mg/dL 10 7 9   Creatinine 0.44 - 1.00 mg/dL 2.35 3.61  Sodium 135 - 145 mmol/L 141 137 139  Potassium 3.5 - 5.1 mmol/L 3.7 3.6 4.1  Chloride 98 - 111 mmol/L 104 102 100  CO2 22 - 32 mmol/L 23 25 22   Calcium 8.9 - 10.3 mg/dL 8.9 9.2 9.4  Total Protein 6.5 - 8.1 g/dL 6.8 6.8 6.8  Total Bilirubin 0.3 - 1.2 mg/dL 4.43) 0.9 1.2  Alkaline Phos 38 - 126 U/L 68 71 82  AST 15 - 41 U/L 40 43(H) 39  ALT 0 - 44 U/L 24 25 27     Assessment: pt stable  Plan:  1. Complex cyst of left ovary Plan is for l/s left ovarian cystectomy and d/c home from pacu. D/w pt may have to remove ovary. Can proceed when  OR is ready  Orders Placed This Encounter  Procedures  . Pregnancy, urine  . Vital signs Per Protocol  . Notify physician per protocol  . Diet Per Protocol  . Pre-admission testing diagnosis  . Medication Instructions Per Protocol  . Discharge instructions  . Void  . IV Fluids Per Protocol  . May  use Lidocaine 1% - 2% subcutaneously prior to IV start if patient not allergic to Lidocaine  . Follow Diabetes Medication Adjustment Guidelines Prior to Procedure and Surgery  . Anesthesia Preoperative Order  . Anesthesia per Enhanced Recovery Pathway  . Informed Consent Details: Physician/Practitioner Attestation; Transcribe to consent form and obtain patient signature  . Initiate Pre-op Protocol  . Diet per anesthesia ENHANCED RECOVERY AFTER SURGERY (ERAS) Pathway.  Three hours prior to scheduled surgery, have patient finish carbohydrate drink and then become NPO at 4:30 AM  . Outpatient / Same Day Surgery  . Pre-admission testing diagnosis  . Clip operative site  . SCD's  . Continuous pulse oximetry  . Oxygen therapy Mode or (Route): Nasal cannula; Keep 02 saturation: > 92%  . Pregnancy, urine POC  . Insert and maintain IV line    RTC post op  Cornelia Copa MD Attending [facility] [employer] (Faculty Practice) 06/05/2020

## 2020-06-06 ENCOUNTER — Encounter (HOSPITAL_BASED_OUTPATIENT_CLINIC_OR_DEPARTMENT_OTHER): Payer: Self-pay | Admitting: Obstetrics and Gynecology

## 2020-06-06 LAB — CYTOLOGY - NON PAP

## 2020-06-07 ENCOUNTER — Telehealth: Payer: Self-pay | Admitting: *Deleted

## 2020-06-07 NOTE — Telephone Encounter (Signed)
Patient called back and scheduled an appt for 9/29 with Dr Andrey Farmer. Gave the address and phone number for the clinic. Also gave the policy for mask and visitors

## 2020-06-07 NOTE — Telephone Encounter (Signed)
Called and left the patient a message to call the office back. Patient needs to be scheduled for a new patient appt  °

## 2020-06-11 ENCOUNTER — Telehealth: Payer: Self-pay | Admitting: Obstetrics and Gynecology

## 2020-06-11 NOTE — Telephone Encounter (Signed)
GYN Telephone Note  D/w her re: operative findings and rationale for Gyn Onc referral later this week. All questions asked and answered  Cornelia Copa MD Attending [facility] [employer] (Faculty Practice) 06/11/2020 Time: 1640

## 2020-06-13 ENCOUNTER — Other Ambulatory Visit: Payer: Self-pay

## 2020-06-13 ENCOUNTER — Encounter: Payer: Self-pay | Admitting: Gynecologic Oncology

## 2020-06-13 ENCOUNTER — Inpatient Hospital Stay: Payer: Self-pay | Attending: Gynecologic Oncology | Admitting: Gynecologic Oncology

## 2020-06-13 VITALS — BP 115/89 | HR 89 | Temp 98.0°F | Resp 18 | Ht 62.0 in | Wt 182.8 lb

## 2020-06-13 DIAGNOSIS — N946 Dysmenorrhea, unspecified: Secondary | ICD-10-CM | POA: Insufficient documentation

## 2020-06-13 DIAGNOSIS — N83202 Unspecified ovarian cyst, left side: Secondary | ICD-10-CM | POA: Insufficient documentation

## 2020-06-13 DIAGNOSIS — E669 Obesity, unspecified: Secondary | ICD-10-CM

## 2020-06-13 DIAGNOSIS — R971 Elevated cancer antigen 125 [CA 125]: Secondary | ICD-10-CM | POA: Insufficient documentation

## 2020-06-13 DIAGNOSIS — N736 Female pelvic peritoneal adhesions (postinfective): Secondary | ICD-10-CM | POA: Insufficient documentation

## 2020-06-13 NOTE — Progress Notes (Signed)
Consult Note: Gyn-Onc  Consult was requested by Dr. Vergie Living for the evaluation of Kara Haynes 29 y.o. female  CC:  Chief Complaint  Patient presents with  . Complex ovarian cyst    New patient    Assessment/Plan:  Kara. MALLOREE Haynes  is a 29 y.o.  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 Pelvic washings are negative. This clinical picture is most consistent with advanced endometriosis.  I personally reviewed her images including her CT scan from July 2021.  I reviewed her operative note and pathology findings from her surgery in September 2021.  I have a low suspicion for an ovarian cancer.  I had a lengthy discussion with Kara Haynes and her partner regarding options.  We discussed the origins of endometriosis and how it causes disease in the pelvis in addition to symptoms.  I explained that the typical surgical management for endometriosis would be removal of any large endometriomas, such as a left salpingo-oophorectomy.  This is typically complicated by risks of damage to adjacent structures particularly bladder, bowel, ureter, and dense adhesions to adjacent gynecologic structures can result in necessitating hysterectomy with subsequent loss of fertility.  I explained that after surgery she would require hormonal suppression with either birth control pills or intrauterine device to prevent additional recurrence of endometriosis particularly in the contralateral ovary.  I explained that risk of needing subsequent surgery after a left salpingo-oophorectomy for advanced endometriosis was high, approximately 50%.  A more definitive surgery would be total hysterectomy with BSO.  This is an extreme option in a patient at her young age.  It is not necessarily recommended, however an option should she desire a more definitive surgical strategy.  She is somewhat ambivalent about future  childbearing.  She is not yet ready to make a definitive decision that would result in infertility.  Additionally she would require hormone replacement therapy until age of natural menopause to prevent early bone loss and cardiovascular health deterioration.  I explained that should she decide to not proceed with surgical intervention the alternative strategy would be close monitoring with serial ultrasounds at 6 monthly intervals with Dr. Vergie Living.  Ca1 25 assessment carries with it a low benefit in a patient who has endometriosis as this will certainly be elevated and variable.  If she decides to not proceed with surgery I would strongly recommend hormonal suppression and medical management of her endometriosis.  The patient desires conversation with her family before making definitive decision.  If she calls back and desires surgery we will schedule her for robotic assisted left salpingo-oophorectomy, lysis of adhesions.  She will require preoperative bowel prep for high risk for colonic resection or colotomy.   HPI: Kara Haynes is a 29 year old P0 who was seen in consultation at the request of Dr Vergie Living for evaluation of a complex left ovarian cyst in the setting of dysmenorrhea, dense pelvic adhesive disease and mildly elevated CA 125.  Kara Haynes history began when she was being evaluated for epigastric pain in July 2021.  This prompted her to undergo CT scan of the abdomen and pelvis in the emergency department (without contrast, stone protocol) which showed a combination of cystic changes in the left ovary measuring 6.3 x 4.2 cm.  She also noted an approximately 44-month history of worsening dysmenorrhea.  She reported that the first 2 days of her menstrual cycle were particularly painful.  Outside of  those 2 days she denied pelvic pain.  She reported that the menstrual cycles themselves were of normal duration, interval, and flow.  She was seen by Dr. Vergie Living for this  previously identified left ovarian cyst and a transvaginal ultrasound scan was performed on May 02, 2020.  This revealed a uterus measuring 6.4 x 3 x 4 cm that was anteverted.  The endometrium was 12 mm.  The right ovary measured 4.6 x 2.8 x 2.6 cm.  It was suboptimally visualized due to bowel but there was a no gross evidence of mass.  The left ovary however appeared abnormal and measured 7.7 x 4.8 x 7.4 cm.  It was abnormal in appearance and enlarged containing multiple cystic locules with several intervening septations.  One of the observed septations was thick and irregular.  There was scattered internal echogenicity.  There was no definite internal blood flow on color Doppler.  Ca1 25 was drawn on May 30, 2020 and was mildly elevated at 42.3.  Due to this finding of the left ovarian cystic mass she was taken to the operating room on June 05, 2020 for a planned left salpingo-oophorectomy or left ovarian cystectomy.  At the time of this surgery Dr. Vergie Living identified a stenotic cervix which was difficult to interrogate with placement of a Hulka.  On the diagnostic laparoscopy there was normal omentum liver edge and upper abdomen appreciated.  There was a 3 to 4 cm area on the small bowel with brown mucus-like material and adhesions.  In the pelvis the anterior uterus was normal but the mobility of the uterus was limited due to the large left adnexal complex.  The right ovary could not be visualized.  The left adnexa the fimbria looked normal except for multiple diffuse subcentimeter yellow cysts.  The left fallopian tube was engorged and the tube itself appeared to be 7 to 8 cm in length and 4 to 5 cm in width.  It appeared to contain brown-black material and was densely adhesed to the uterus and left lower pelvis with minimal mobility  Dr. Vergie Living felt that proceeding with surgical removal of the left adnexa was not safe by him and instead planned to abort the procedure and have the patient  seen by GYN oncology.  Pelvic washings were obtained prior to aborting the procedure.  These were cytologically normal.  Her medical history is most significant for asthma (she was last hospitalized in 2018) and tobacco abuse.  Her surgical history is most significant for a diagnostic laparoscopy in 2021.  Her gynecologic history is remarkable for being in a same sex relationship with no history of vaginal intercourse. She has had one prior pap smear that was normal. She is fairly ambivalent about childbearing though would ideally like to maintain the potential for caring a pregnancy should her same-sex partner be unable to do so.  Her family cancer history is unremarkable.  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 She lives with her same sex partner.   Current Meds:  Outpatient Encounter Medications as of 06/13/2020  Medication Sig  . Albuterol Sulfate (PROVENTIL HFA IN) Inhale 2 puffs into the lungs as directed.  Marland Kitchen ibuprofen (ADVIL) 600 MG tablet Take 1 tablet (600 mg total) by mouth every 6 (six) hours as needed.  . pantoprazole (PROTONIX) 40 MG tablet Take 1 tablet (40 mg total) by mouth daily. (Patient taking differently: Take 40 mg by mouth daily. )  . [  DISCONTINUED] budesonide-formoterol (SYMBICORT) 80-4.5 MCG/ACT inhaler Inhale 2 puffs into the lungs daily.  . [DISCONTINUED] docusate sodium (COLACE) 100 MG capsule Take 1 capsule (100 mg total) by mouth 2 (two) times daily as needed.  . [DISCONTINUED] oxyCODONE-acetaminophen (PERCOCET/ROXICET) 5-325 MG tablet Take 1-2 tablets by mouth every 6 (six) hours as needed.   No facility-administered encounter medications on file as of 06/13/2020.    Allergy:  Allergies  Allergen Reactions  . Fish Allergy Hives, Shortness Of Breath and Swelling    Mostly tilapia    Social Hx:   Social History   Socioeconomic History  . Marital status: Single    Spouse name: Not on file   . Number of children: Not on file  . Years of education: Not on file  . Highest education level: Not on file  Occupational History  . Not on file  Tobacco Use  . Smoking status: Current Every Day Smoker    Packs/day: 0.50    Years: 12.00    Pack years: 6.00    Types: Cigarettes  . Smokeless tobacco: Never Used  Vaping Use  . Vaping Use: Never used  Substance and Sexual Activity  . Alcohol use: Yes    Alcohol/week: 0.0 standard drinks    Comment: 05-31-2020  per pt  5 shots  vodka per day  . Drug use: Yes    Types: Marijuana  . Sexual activity: Yes    Birth control/protection: None  Other Topics Concern  . Not on file  Social History Narrative  . Not on file   Social Determinants of Health   Financial Resource Strain:   . Difficulty of Paying Living Expenses: Not on file  Food Insecurity: No Food Insecurity  . Worried About Programme researcher, broadcasting/film/video in the Last Year: Never true  . Ran Out of Food in the Last Year: Never true  Transportation Needs: No Transportation Needs  . Lack of Transportation (Medical): No  . Lack of Transportation (Non-Medical): No  Physical Activity:   . Days of Exercise per Week: Not on file  . Minutes of Exercise per Session: Not on file  Stress:   . Feeling of Stress : Not on file  Social Connections:   . Frequency of Communication with Friends and Family: Not on file  . Frequency of Social Gatherings with Friends and Family: Not on file  . Attends Religious Services: Not on file  . Active Member of Clubs or Organizations: Not on file  . Attends Banker Meetings: Not on file  . Marital Status: Not on file  Intimate Partner Violence:   . Fear of Current or Ex-Partner: Not on file  . Emotionally Abused: Not on file  . Physically Abused: Not on file  . Sexually Abused: Not on file    Past Surgical Hx:  Past Surgical History:  Procedure Laterality Date  . LAPAROSCOPIC OVARIAN CYSTECTOMY Left 06/05/2020   Procedure: LAPAROSCOPIC  OVARIAN CYSTECTOMY;  Surgeon: Carrier Bing, MD;  Location: [facility];  Service: Gynecology;  Laterality: Left;  . NO PAST SURGERIES      Past Medical Hx:  Past Medical History:  Diagnosis Date  . Alcohol abuse   . Allergic rhinitis, seasonal   . Dyspnea    with asthma  . Eczema   . GERD (gastroesophageal reflux disease)   . 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 History of trichomoniasis   .  Left ovarian cyst   . Mild intermittent asthma    followed by pcp   (05-31-2020  per pt last exacerbation 11-09-2019 ED visit)  . Obesity (BMI 30-39.9)     Past Gynecological History:  See HPI Patient's last menstrual period was 05/16/2020 (approximate).  Family Hx:  Family History  Problem Relation Age of Onset  . Other Other        denies family h/o clotting d/o  . Asthma Maternal Grandfather   . Hypertension Maternal Grandfather   . Healthy Mother   . Healthy Father     Review of Systems:  Constitutional  Feels well,    ENT Normal appearing ears and nares bilaterally Skin/Breast  No rash, sores, jaundice, itching, dryness Cardiovascular  No chest pain, shortness of breath, or edema  Pulmonary  No cough or wheeze.  Gastro Intestinal  No nausea, vomitting, or diarrhoea. No bright red blood per rectum, no abdominal pain, change in bowel movement, or constipation.  Genito Urinary  No frequency, urgency, dysuria, + dysmenorrhea Musculo Skeletal  No myalgia, arthralgia, joint swelling or pain  Neurologic  No weakness, numbness, change in gait,  Psychology  No depression, anxiety, insomnia.   Vitals:  Blood pressure 115/89, pulse 89, temperature 98 F (36.7 C), temperature source Tympanic, resp. rate 18, height 5\' 2"  (1.575 m), weight 182 lb 12.8 oz (82.9 kg), last menstrual period 05/16/2020, SpO2 100 %.  Physical Exam: WD in NAD Neck  Supple NROM, without any  enlargements.  Lymph Node Survey No cervical supraclavicular or inguinal adenopathy Cardiovascular  Pulse normal rate, regularity and rhythm. S1 and S2 normal.  Lungs  Clear to auscultation bilateraly, without wheezes/crackles/rhonchi. Good air movement.  Skin  No rash/lesions/breakdown  Psychiatry  Alert and oriented to person, place, and time  Abdomen  Normoactive bowel sounds, abdomen soft, non-tender and obese without evidence of hernia.  Back No CVA tenderness Genito Urinary  Vulva/vagina: Normal external female genitalia.  No lesions. No discharge or bleeding.  Bladder/urethra:  No lesions or masses, well supported bladder  Vagina: narrow, does not permit a speculum  Cervix: Normal to palpate, no lesions.  Uterus: minimally mobile, bulky, no parametrial involvement or nodularity.  Adnexa: fullness throughout the pelvis without a discrete mass, but unable to perform thorough exam due to patient discomfort  Rectal  Good tone, no masses no cul de sac nodularity. However, the rectum is in close approximately with posterior uterus/cervix which is concerning for an obliterated posterior cul de sac. Extremities  No bilateral cyanosis, clubbing or edema.  60 minutes of total time was spent for this patient encounter, including preparation, face-to-face counseling with the patient and coordination of care, review of imaging (results and images), communication with the referring provider and documentation of the encounter.   Luisa DagoEmma C Jilian West, MD  06/13/2020, 10:33 AM

## 2020-06-13 NOTE — Patient Instructions (Signed)
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  This causes pain in the development of blood-filled ovarian cysts that become very stuck to surrounding organs such as the uterus, rectum, bladder, and other important pelvic structures.  She recommends a surgical removal of at least the left tube and ovary.  This could likely be accomplished through small incisions as an outpatient surgery.  However it carries with it significant risk of damage to surrounding structures such as the bowels and urinary tract.  An alternative would be a surgery to remove the entire uterus and cervix and both tubes and ovaries.  While this would achieve prevention of future episodes of endometriosis, it would result in permanent infertility.  It would also result in a menopausal state for which she would need to take hormone replacement therapy until approximately age 89 years of age.  An alternative to surgical management, and something that would need to take place after the left tube and ovary was removed, is medical suppression of the endometriosis with either a birth control pill or placement of an intrauterine birth control device called an IUD.  Dr. Vergie Living can accomplish this treatment.  If you were to decide to not proceed with surgical removal of the left tube and ovary, or complete hysterectomy, Dr. Andrey Farmer recommends 6 monthly ultrasounds to evaluate the state of the left tube and ovary for change or growth that would necessitate proceeding with surgery.  Dr. Vergie Living could facilitate monitoring of the ovary.  Should you decide to proceed with surgical intervention, please contact Dr. Oliver Hum office at [phone].

## 2020-06-13 NOTE — H&P (View-Only) (Signed)
Consult Note: Gyn-Onc  Consult was requested by Dr. Pickens for the evaluation of Kara Haynes 29 y.o. female  CC:  Chief Complaint  Patient presents with  . Complex ovarian cyst    New patient    Assessment/Plan:  Kara. Veronika J Haynes  is a 29 y.o.  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 Pelvic washings are negative. This clinical picture is most consistent with advanced endometriosis.  I personally reviewed her images including her CT scan from July 2021.  I reviewed her operative note and pathology findings from her surgery in September 2021.  I have a low suspicion for an ovarian cancer.  I had a lengthy discussion with Raenah and her partner regarding options.  We discussed the origins of endometriosis and how it causes disease in the pelvis in addition to symptoms.  I explained that the typical surgical management for endometriosis would be removal of any large endometriomas, such as a left salpingo-oophorectomy.  This is typically complicated by risks of damage to adjacent structures particularly bladder, bowel, ureter, and dense adhesions to adjacent gynecologic structures can result in necessitating hysterectomy with subsequent loss of fertility.  I explained that after surgery she would require hormonal suppression with either birth control pills or intrauterine device to prevent additional recurrence of endometriosis particularly in the contralateral ovary.  I explained that risk of needing subsequent surgery after a left salpingo-oophorectomy for advanced endometriosis was high, approximately 50%.  A more definitive surgery would be total hysterectomy with BSO.  This is an extreme option in a patient at her young age.  It is not necessarily recommended, however an option should she desire a more definitive surgical strategy.  She is somewhat ambivalent about future  childbearing.  She is not yet ready to make a definitive decision that would result in infertility.  Additionally she would require hormone replacement therapy until age of natural menopause to prevent early bone loss and cardiovascular health deterioration.  I explained that should she decide to not proceed with surgical intervention the alternative strategy would be close monitoring with serial ultrasounds at 6 monthly intervals with Dr. Pickens.  Ca1 25 assessment carries with it a low benefit in a patient who has endometriosis as this will certainly be elevated and variable.  If she decides to not proceed with surgery I would strongly recommend hormonal suppression and medical management of her endometriosis.  The patient desires conversation with her family before making definitive decision.  If she calls back and desires surgery we will schedule her for robotic assisted left salpingo-oophorectomy, lysis of adhesions.  She will require preoperative bowel prep for high risk for colonic resection or colotomy.   HPI: Kara Haynes is a 29 year old P0 who was seen in consultation at the request of Dr Pickens for evaluation of a complex left ovarian cyst in the setting of dysmenorrhea, dense pelvic adhesive disease and mildly elevated CA 125.  Kara. Haynes history began when she was being evaluated for epigastric pain in July 2021.  This prompted her to undergo CT scan of the abdomen and pelvis in the emergency department (without contrast, stone protocol) which showed a combination of cystic changes in the left ovary measuring 6.3 x 4.2 cm.  She also noted an approximately 4-month history of worsening dysmenorrhea.  She reported that the first 2 days of her menstrual cycle were particularly painful.  Outside of   those 2 days she denied pelvic pain.  She reported that the menstrual cycles themselves were of normal duration, interval, and flow.  She was seen by Dr. Pickens for this  previously identified left ovarian cyst and a transvaginal ultrasound scan was performed on May 02, 2020.  This revealed a uterus measuring 6.4 x 3 x 4 cm that was anteverted.  The endometrium was 12 mm.  The right ovary measured 4.6 x 2.8 x 2.6 cm.  It was suboptimally visualized due to bowel but there was a no gross evidence of mass.  The left ovary however appeared abnormal and measured 7.7 x 4.8 x 7.4 cm.  It was abnormal in appearance and enlarged containing multiple cystic locules with several intervening septations.  One of the observed septations was thick and irregular.  There was scattered internal echogenicity.  There was no definite internal blood flow on color Doppler.  Ca1 25 was drawn on May 30, 2020 and was mildly elevated at 42.3.  Due to this finding of the left ovarian cystic mass she was taken to the operating room on June 05, 2020 for a planned left salpingo-oophorectomy or left ovarian cystectomy.  At the time of this surgery Dr. Pickens identified a stenotic cervix which was difficult to interrogate with placement of a Hulka.  On the diagnostic laparoscopy there was normal omentum liver edge and upper abdomen appreciated.  There was a 3 to 4 cm area on the small bowel with brown mucus-like material and adhesions.  In the pelvis the anterior uterus was normal but the mobility of the uterus was limited due to the large left adnexal complex.  The right ovary could not be visualized.  The left adnexa the fimbria looked normal except for multiple diffuse subcentimeter yellow cysts.  The left fallopian tube was engorged and the tube itself appeared to be 7 to 8 cm in length and 4 to 5 cm in width.  It appeared to contain brown-black material and was densely adhesed to the uterus and left lower pelvis with minimal mobility  Dr. Pickens felt that proceeding with surgical removal of the left adnexa was not safe by him and instead planned to abort the procedure and have the patient  seen by GYN oncology.  Pelvic washings were obtained prior to aborting the procedure.  These were cytologically normal.  Her medical history is most significant for asthma (she was last hospitalized in 2018) and tobacco abuse.  Her surgical history is most significant for a diagnostic laparoscopy in 2021.  Her gynecologic history is remarkable for being in a same sex relationship with no history of vaginal intercourse. She has had one prior pap smear that was normal. She is fairly ambivalent about childbearing though would ideally like to maintain the potential for caring a pregnancy should her same-sex partner be unable to do so.  Her family cancer history is unremarkable.  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 She lives with her same sex partner.   Current Meds:  Outpatient Encounter Medications as of 06/13/2020  Medication Sig  . Albuterol Sulfate (PROVENTIL HFA IN) Inhale 2 puffs into the lungs as directed.  . ibuprofen (ADVIL) 600 MG tablet Take 1 tablet (600 mg total) by mouth every 6 (six) hours as needed.  . pantoprazole (PROTONIX) 40 MG tablet Take 1 tablet (40 mg total) by mouth daily. (Patient taking differently: Take 40 mg by mouth daily. )  . [  DISCONTINUED] budesonide-formoterol (SYMBICORT) 80-4.5 MCG/ACT inhaler Inhale 2 puffs into the lungs daily.  . [DISCONTINUED] docusate sodium (COLACE) 100 MG capsule Take 1 capsule (100 mg total) by mouth 2 (two) times daily as needed.  . [DISCONTINUED] oxyCODONE-acetaminophen (PERCOCET/ROXICET) 5-325 MG tablet Take 1-2 tablets by mouth every 6 (six) hours as needed.   No facility-administered encounter medications on file as of 06/13/2020.    Allergy:  Allergies  Allergen Reactions  . Fish Allergy Hives, Shortness Of Breath and Swelling    Mostly tilapia    Social Hx:   Social History   Socioeconomic History  . Marital status: Single    Spouse name: Not on file   . Number of children: Not on file  . Years of education: Not on file  . Highest education level: Not on file  Occupational History  . Not on file  Tobacco Use  . Smoking status: Current Every Day Smoker    Packs/day: 0.50    Years: 12.00    Pack years: 6.00    Types: Cigarettes  . Smokeless tobacco: Never Used  Vaping Use  . Vaping Use: Never used  Substance and Sexual Activity  . Alcohol use: Yes    Alcohol/week: 0.0 standard drinks    Comment: 05-31-2020  per pt  5 shots  vodka per day  . Drug use: Yes    Types: Marijuana  . Sexual activity: Yes    Birth control/protection: None  Other Topics Concern  . Not on file  Social History Narrative  . Not on file   Social Determinants of Health   Financial Resource Strain:   . Difficulty of Paying Living Expenses: Not on file  Food Insecurity: No Food Insecurity  . Worried About Running Out of Food in the Last Year: Never true  . Ran Out of Food in the Last Year: Never true  Transportation Needs: No Transportation Needs  . Lack of Transportation (Medical): No  . Lack of Transportation (Non-Medical): No  Physical Activity:   . Days of Exercise per Week: Not on file  . Minutes of Exercise per Session: Not on file  Stress:   . Feeling of Stress : Not on file  Social Connections:   . Frequency of Communication with Friends and Family: Not on file  . Frequency of Social Gatherings with Friends and Family: Not on file  . Attends Religious Services: Not on file  . Active Member of Clubs or Organizations: Not on file  . Attends Club or Organization Meetings: Not on file  . Marital Status: Not on file  Intimate Partner Violence:   . Fear of Current or Ex-Partner: Not on file  . Emotionally Abused: Not on file  . Physically Abused: Not on file  . Sexually Abused: Not on file    Past Surgical Hx:  Past Surgical History:  Procedure Laterality Date  . LAPAROSCOPIC OVARIAN CYSTECTOMY Left 06/05/2020   Procedure: LAPAROSCOPIC  OVARIAN CYSTECTOMY;  Surgeon: Pickens, Charlie, MD;  Location: [facility];  Service: Gynecology;  Laterality: Left;  . NO PAST SURGERIES      Past Medical Hx:  Past Medical History:  Diagnosis Date  . Alcohol abuse   . Allergic rhinitis, seasonal   . Dyspnea    with asthma  . Eczema   . GERD (gastroesophageal reflux disease)   . 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 History of trichomoniasis   .   Left ovarian cyst   . Mild intermittent asthma    followed by pcp   (05-31-2020  per pt last exacerbation 11-09-2019 ED visit)  . Obesity (BMI 30-39.9)     Past Gynecological History:  See HPI Patient's last menstrual period was 05/16/2020 (approximate).  Family Hx:  Family History  Problem Relation Age of Onset  . Other Other        denies family h/o clotting d/o  . Asthma Maternal Grandfather   . Hypertension Maternal Grandfather   . Healthy Mother   . Healthy Father     Review of Systems:  Constitutional  Feels well,    ENT Normal appearing ears and nares bilaterally Skin/Breast  No rash, sores, jaundice, itching, dryness Cardiovascular  No chest pain, shortness of breath, or edema  Pulmonary  No cough or wheeze.  Gastro Intestinal  No nausea, vomitting, or diarrhoea. No bright red blood per rectum, no abdominal pain, change in bowel movement, or constipation.  Genito Urinary  No frequency, urgency, dysuria, + dysmenorrhea Musculo Skeletal  No myalgia, arthralgia, joint swelling or pain  Neurologic  No weakness, numbness, change in gait,  Psychology  No depression, anxiety, insomnia.   Vitals:  Blood pressure 115/89, pulse 89, temperature 98 F (36.7 C), temperature source Tympanic, resp. rate 18, height 5' 2" (1.575 m), weight 182 lb 12.8 oz (82.9 kg), last menstrual period 05/16/2020, SpO2 100 %.  Physical Exam: WD in NAD Neck  Supple NROM, without any  enlargements.  Lymph Node Survey No cervical supraclavicular or inguinal adenopathy Cardiovascular  Pulse normal rate, regularity and rhythm. S1 and S2 normal.  Lungs  Clear to auscultation bilateraly, without wheezes/crackles/rhonchi. Good air movement.  Skin  No rash/lesions/breakdown  Psychiatry  Alert and oriented to person, place, and time  Abdomen  Normoactive bowel sounds, abdomen soft, non-tender and obese without evidence of hernia.  Back No CVA tenderness Genito Urinary  Vulva/vagina: Normal external female genitalia.  No lesions. No discharge or bleeding.  Bladder/urethra:  No lesions or masses, well supported bladder  Vagina: narrow, does not permit a speculum  Cervix: Normal to palpate, no lesions.  Uterus: minimally mobile, bulky, no parametrial involvement or nodularity.  Adnexa: fullness throughout the pelvis without a discrete mass, but unable to perform thorough exam due to patient discomfort  Rectal  Good tone, no masses no cul de sac nodularity. However, the rectum is in close approximately with posterior uterus/cervix which is concerning for an obliterated posterior cul de sac. Extremities  No bilateral cyanosis, clubbing or edema.  60 minutes of total time was spent for this patient encounter, including preparation, face-to-face counseling with the patient and coordination of care, review of imaging (results and images), communication with the referring provider and documentation of the encounter.   Jensen Cheramie C Darlene Bartelt, MD  06/13/2020, 10:33 AM     

## 2020-06-15 ENCOUNTER — Telehealth: Payer: Self-pay | Admitting: *Deleted

## 2020-06-15 ENCOUNTER — Telehealth: Payer: Self-pay

## 2020-06-15 NOTE — Telephone Encounter (Signed)
ENCOUNTER OPENED IN ERROR

## 2020-06-15 NOTE — Telephone Encounter (Signed)
Ms Kara Haynes would like to have her surgery on Tuesday 10-19. Set her up for a pre-op visit with Warner Mccreedy, NP on 06-21-20 at 1000. Pt verbalized understanding.

## 2020-06-15 NOTE — Telephone Encounter (Signed)
Patient called and stated "I would like to go ahead with the surgery on the left side. I am open any day for the surgery." Explained that the message would be forwarded to Dr Andrey Farmer and [facility] Kaufmann APP; someone would call her back.

## 2020-06-18 ENCOUNTER — Encounter: Payer: Self-pay | Admitting: Gynecologic Oncology

## 2020-06-18 ENCOUNTER — Telehealth: Payer: Self-pay

## 2020-06-18 ENCOUNTER — Other Ambulatory Visit: Payer: Self-pay | Admitting: Gynecologic Oncology

## 2020-06-18 DIAGNOSIS — N83202 Unspecified ovarian cyst, left side: Secondary | ICD-10-CM

## 2020-06-18 NOTE — Telephone Encounter (Signed)
Told Ms [name] that Warner Mccreedy, NP: was reviewing her chart for her pre op visit Thursday 06-21-20 noticed that she has a history of drinking 5 shots of Vodka a day.  Pt stated that is correct. Dr. Andrey Farmer said that that consumption could affect surgery and anesthesia.  She needs her to abstain from the alcohol begining now. Stopping now will decrease chance of complications and help her to heal post operatively. Pt stated that she had to doe this as well with previous surgery. She stated that she can stop the alcohol prior to surgery.

## 2020-06-19 NOTE — Patient Instructions (Signed)
Preparing for your Surgery  Plan for surgery on July 03, 2020 with Dr. Adolphus Birchwood at [facility]. You will be scheduled for a robotic assisted unilateral salpingo-oophorectomy (removal of one ovary and fallopian tube), possible robotic assisted laparoscopic hysterectomy (removal of uterus and cervix), possible bilateral salpingo-oophorectomy (removal of both ovaries and fallopian tubes), lysis of adhesions (taking down scar tissue).   -Dr. Andrey Farmer would like for you to abstain or stop use of alcohol before surgery since this can affect your surgery, anesthesia, healing and recovery.   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 Andrey Farmer is recommending use of lovenox injections once a day for two weeks AFTER surgery to prevent blood clots.  Pre-operative Testing -You will receive a phone call from presurgical testing at [facility] to arrange for a pre-operative appointment over the phone, lab appointment, and COVID test. The COVID test normally happens 3 days prior to the surgery and they ask that you self quarantine after the test up until surgery to decrease chance of exposure.  -Bring your insurance card, copy of an advanced directive if applicable, medication list  -At that visit, you will be asked to sign a consent for a possible blood transfusion in case a transfusion becomes necessary during surgery.  The need for a blood transfusion is rare but having consent is a necessary part of your care.     -You should not be taking blood thinners or aspirin at least ten days prior to surgery unless instructed by your surgeon.  -Do not take supplements such as fish oil (omega 3), red yeast rice, turmeric before your surgery.   Day Before Surgery at Home -SEE BOWEL PREP INSTRUCTIONS. You will be advised you can have clear liquids up until 3 hours before your surgery.    Your role in recovery Your role is to become active as soon as directed by your doctor, while still  giving yourself time to heal.  Rest when you feel tired. You will be asked to do the following in order to speed your recovery:  - Cough and breathe deeply. This helps to clear and expand your lungs and can prevent pneumonia after surgery.  - STAY ACTIVE WHEN YOU GET HOME. Do mild physical activity. Walking or moving your legs help your circulation and body functions return to normal. Do not try to get up or walk alone the first time after surgery.   -If you develop swelling on one leg or the other, pain in the back of your leg, redness/warmth in one of your legs, please call the office or go to the Emergency Room to have a doppler to rule out a blood clot. For shortness of breath, chest pain-seek care in the Emergency Room as soon as possible. - Actively manage your pain. Managing your pain lets you move in comfort. We will ask you to rate your pain on a scale of zero to 10. It is your responsibility to tell your doctor or nurse where and how much you hurt so your pain can be treated.  Special Considerations -If you are diabetic, you may be placed on insulin after surgery to have closer control over your blood sugars to promote healing and recovery.  This does not mean that you will be discharged on insulin.  If applicable, your oral antidiabetics will be resumed when you are tolerating a solid diet.  -Your final pathology results from surgery should be available around one week after surgery and the  results will be relayed to you when available.  -Dr. Antionette Char is the surgeon that assists your GYN Oncologist with surgery.  If you end up staying the night, the next day after your surgery you will either see Dr. Andrey Farmer, Dr. Pricilla Holm, or Dr. Antionette Char.  -FMLA forms can be faxed to [phone] and please allow 5-7 business days for completion.  Pain Management After Surgery -You have been prescribed your pain medication and bowel regimen medications before surgery so that you can have  these available when you are discharged from the hospital. The pain medication is for use ONLY AFTER surgery and a new prescription will not be given.   -Make sure that you have Tylenol and Ibuprofen at home to use on a regular basis after surgery for pain control. We recommend alternating the medications every hour to six hours since they work differently and are processed in the body differently for pain relief.  -Review the attached handout on narcotic use and their risks and side effects.   Bowel Regimen -You have been prescribed Sennakot-S to take nightly to prevent constipation especially if you are taking the narcotic pain medication intermittently.  It is important to prevent constipation and drink adequate amounts of liquids. You can stop taking this medication when you are not taking pain medication and you are back on your normal bowel routine.  Risks of Surgery Risks of surgery are low but include bleeding, infection, damage to surrounding structures, re-operation, blood clots, and very rarely death.   Blood Transfusion Information (For the consent to be signed before surgery)  We will be checking your blood type before surgery so in case of emergencies, we will know what type of blood you would need.                                            WHAT IS A BLOOD TRANSFUSION?  A transfusion is the replacement of blood or some of its parts. Blood is made up of multiple cells which provide different functions.  Red blood cells carry oxygen and are used for blood loss replacement.  White blood cells fight against infection.  Platelets control bleeding.  Plasma helps clot blood.  Other blood products are available for specialized needs, such as hemophilia or other clotting disorders. BEFORE THE TRANSFUSION  Who gives blood for transfusions?   You may be able to donate blood to be used at a later date on yourself (autologous donation).  Relatives can be asked to donate blood.  This is generally not any safer than if you have received blood from a stranger. The same precautions are taken to ensure safety when a relative's blood is donated.  Healthy volunteers who are fully evaluated to make sure their blood is safe. This is blood bank blood. Transfusion therapy is the safest it has ever been in the practice of medicine. Before blood is taken from a donor, a complete history is taken to make sure that person has no history of diseases nor engages in risky social behavior (examples are intravenous drug use or sexual activity with multiple partners). The donor's travel history is screened to minimize risk of transmitting infections, such as malaria. The donated blood is tested for signs of infectious diseases, such as HIV and hepatitis. The blood is then tested to be sure it is compatible with you in order to minimize  the chance of a transfusion reaction. If you or a relative donates blood, this is often done in anticipation of surgery and is not appropriate for emergency situations. It takes many days to process the donated blood. RISKS AND COMPLICATIONS Although transfusion therapy is very safe and saves many lives, the main dangers of transfusion include:   Getting an infectious disease.  Developing a transfusion reaction. This is an allergic reaction to something in the blood you were given. Every precaution is taken to prevent this. The decision to have a blood transfusion has been considered carefully by your caregiver before blood is given. Blood is not given unless the benefits outweigh the risks.  AFTER SURGERY INSTRUCTIONS  Return to work: 3-4 weeks if applicable  Activity: 1. Be up and out of the bed during the day.  Take a nap if needed.  You may walk up steps but be careful and use the hand rail.  Stair climbing will tire you more than you think, you may need to stop part way and rest.   2. No lifting or straining for 6 weeks over 10 pounds. No pushing,  pulling, straining for 6 weeks.  3. No driving for 1 week(s).  Do not drive if you are taking narcotic pain medicine and make sure that your reaction time has returned.   4. You can shower as soon as the next day after surgery. Shower daily.  Use your regular soap and water (not directly on the incision) and pat your incision(s) dry afterwards; don't rub.  No tub baths or submerging your body in water until cleared by your surgeon. If you have the soap that was given to you by pre-surgical testing that was used before surgery, you do not need to use it afterwards because this can irritate your incisions.   5. No sexual activity and nothing in the vagina for 4 weeks. IF YOU HAVE A HYSTERECTOMY, NOTHING IN THE VAGINA FOR 8 WEEKS.  6. You may experience a small amount of clear drainage from your incisions, which is normal.  If the drainage persists, increases, or changes color please call the office.  7. Do not use creams, lotions, or ointments such as neosporin on your incisions after surgery until advised by your surgeon because they can cause removal of the dermabond glue on your incisions.    8. You may experience vaginal spotting after surgery or around the 6-8 week mark from surgery when the stitches at the top of the vagina begin to dissolve IF YOU HAVE A HYSTERECTOMY.  The spotting is normal but if you experience heavy bleeding, call our office.  9. Take Tylenol or ibuprofen first for pain and only use narcotic pain medication for severe pain not relieved by the Tylenol or Ibuprofen.  Monitor your Tylenol intake to a max of 4,000 mg in a 24 hour period. You can alternate these medications after surgery.  Diet: 1. Low sodium Heart Healthy Diet is recommended but you are cleared to resume your normal (before surgery) diet after your procedure.  2. It is safe to use a laxative, such as Miralax or Colace, if you have difficulty moving your bowels. You have been prescribed Sennakot at bedtime  every evening to keep bowel movements regular and to prevent constipation.    Wound Care: 1. Keep clean and dry.  Shower daily.  Reasons to call the Doctor:  Fever - Oral temperature greater than 100.4 degrees Fahrenheit  Foul-smelling vaginal discharge  Difficulty urinating  Nausea  and vomiting  Increased pain at the site of the incision that is unrelieved with pain medicine.  Difficulty breathing with or without chest pain  New calf pain especially if only on one side  Sudden, continuing increased vaginal bleeding with or without clots.   Contacts: For questions or concerns you should contact:  Dr. Adolphus Birchwood at [phone]  Warner Mccreedy, NP at [phone]  After Hours: call [phone] and have the GYN Oncologist paged/contacted (after 5 pm or on the weekends)  COLON BOWEL PREP   FIVE DAYS PRIOR TO YOUR SURGERY  Obtain supplies for the bowel prep at a pharmacy of your choice: Office-e-mailed prescriptions for your antibiotic pills (Neomycin & Erythromycin)  2 bottles of magnesium citrate- no prescription required    Change your diet to make the bowel prep go more easily: Switch to a bland, low fiber diet Stop eating any nuts, popcorn, or fruit with seeds.  Stop all fiber supplements such as Metamucil, Miralax, etc.    Improve nutrition: Consider drinking 2-3 nutritional shakes (Ex: Ensure Surgery) every day, starting 5 days prior to surgery   DAY PRIOR TO SURGERY   Switch to a full liquid diet the day before surgery Drink plenty of liquids all day to avoid getting dehydrated    12:00pm Drink two bottles of magnesium citrate.   (You should finish in 2 hours)  2:00pm Take 2 Neomycin 500mg  tablets & 2 Erythromycin 500mg  tablets  3:00pm Take 2 Neomycin 500mg  tablets & 2 Erythromycin 500mg  tablets  Drink plenty of clear liquids all evening to avoid getting dehydrated  10:00pm Take 2 Neomycin 500mg  tablets & 2 Erythromycin 500mg  tablets  Drink 2  Carbohydrate loading nutrition drinks (ex: Ensure Presurgery). These will be given at pre-op appointment. Do not eat anything solid after bedtime (midnight) the night before your surgery.   BUT DO drink plenty of clear liquids (Water, Gatorade, juice, soda, coffee, tea, broths, etc.) up to 3 hours prior to surgery to avoid getting dehydrated.    MORNING OF SURGERY  Remember to not to eat anything solid that morning  Drink one final carbohydrate loading nutritional drink (ex: Ensure Presurgery) upon waking up in the morning (needs to be 3 hours before your surgery). Hold or take medications as recommended by the hospital staff at your Preoperative visit Stop drinking liquids before you leave the house (>3 hours prior to surgery)    If you have questions or concerns, please call GYN Oncology Office at [phone] during business hours to speak to the clinical staff for advice.    WHAT DO I NEED TO KNOW ABOUT A FULL LIQUID DIET? -You may have any liquid. -You may have any food that becomes a liquid at room temperature. The food is considered a liquid if it can be poured off a spoon at room temperature. WHAT FOODS CAN I EAT? -Grains: Any grain food that can be pureed in soup (such as crackers, pasta, and rice). Hot cereal (such as farina or oatmeal) that has been blended.  -Fruits: Fruit juice, including nectars and juices. -Meats and Other Protein Sources: Eggs in custard, eggnog mix, and eggs used in ice cream or pudding. Strained meats, like in baby food, may be allowed. Consult your health care provider.  -Dairy: Milk and milk-based beverages, including milk shakes and instant breakfast mixes. Smooth yogurt. Pureed cottage cheese. Avoid these foods if they are not well tolerated. -Beverages: All beverages, including liquid nutritional supplements. AVOID CARBONATED BEVERAGES. -Condiments: Iodized salt, pepper, spices,  and flavorings. Cocoa powder. Vinegar, ketchup, yellow mustard, smooth  sauces (such as hollandaise, cheese sauce, or white sauce), and soy sauce. -Sweets and Desserts: Custard, smooth pudding. Flavored gelatin. Tapioca, junket. Plain ice cream, sherbet, fruit ices. Frozen ice pops, frozen fudge pops, pudding pops, and other frozen bars with cream. Syrups, including chocolate syrup. Sugar, honey, jelly.  -Fats and Oils: Margarine, butter, cream, sour cream, and oils. -Other: Broth and cream soups. Strained, broth-based soups. The items listed above may not be a complete list of recommended foods or beverages.  WHAT FOODS CAN I NOT EAT? Grains: All breads. Grains are not allowed unless they are pureed into soup. Vegetables: No raw vegetables. Vegetables are not allowed unless they are juiced, or cooked and pureed into soup. Fruits: Fruits are not allowed unless they are juiced. Meats and Other Protein Sources: Any meat or fish. Cooked or raw eggs. Nut butters.  Dairy: Cheese.  Condiments: Stone ground mustards. Fats and Oils: Fats that are coarse or chunky. Sweets and Desserts: Ice cream or other frozen desserts that have any solids in them or on top, such as nuts, chocolate chips, and pieces of cookies. Cakes. Cookies. Candy. Others: Soups with chunks or pieces in them.

## 2020-06-19 NOTE — Progress Notes (Signed)
Patient here alone for a pre-operative appointment prior to her scheduled surgery on July 03, 2020. She is scheduled for a robotic assisted unilateral salpingo-oophorectomy (removal of one ovary and fallopian tube), possible robotic assisted laparoscopic hysterectomy (removal of uterus and cervix), possible bilateral salpingo-oophorectomy (removal of both ovaries and fallopian tubes), lysis of adhesions (taking down scar tissue). She has her pre-admission testing appointment scheduled for June 27, 2020 at [facility].  The surgery was discussed in detail.  See after visit summary for additional details.      Discussed post-op pain management in detail including the aspects of the enhanced recovery pathway.  Advised her that a new prescription would be sent in for oxycodone and it is only to be used for after her upcoming surgery.  We discussed the use of tylenol post-op and to monitor for a maximum of 4,000 mg in a 24 hour period.  Also discussed use of sennakot to be used after surgery and to hold if having loose stools but she states she thinks she has this at home. She will call the office with an update on which medication for constipation she has at home.  Discussed bowel regimen in detail.     Discussed the use of lovenox pre-op, SCDs, and measures to take at home to prevent DVT including frequent mobility.  Reportable signs and symptoms of DVT discussed. Post-operative instructions discussed and expectations for after surgery. Incisional care discussed as well including reportable signs and symptoms including erythema, drainage, wound separation.  She is advised to abstain from alcohol use prior to surgery. Due to her history of PE in the past, lovenox 40 mg SQ once daily will be prescribed for a total of two weeks to prevent DVT post-operatively. A financial drug assistance form has been completed and faxed to see if the patient will qualify for free or reduced cost lovenox to the manufacturer.  Bowel prep also discussed in detail. Good Rx card noted erythromycin at [facility] for $32. Patient advised we would send her medications to whichever pharmacy had the most affordable prices for her. Advised we would contact her when we had heard about the lovenox injections.      15 minutes spent with the patient. Verbalizing understanding of material discussed. No needs or concerns voiced at the end of the visit.  Advised patient and family to call for any needs.  Advised that her pre-op and post-operative medications had been prescribed and could be picked up at any time.

## 2020-06-21 ENCOUNTER — Other Ambulatory Visit (HOSPITAL_COMMUNITY): Payer: Self-pay | Admitting: Gynecologic Oncology

## 2020-06-21 ENCOUNTER — Inpatient Hospital Stay: Payer: Self-pay | Attending: Gynecologic Oncology | Admitting: Gynecologic Oncology

## 2020-06-21 ENCOUNTER — Encounter: Payer: Self-pay | Admitting: Gynecologic Oncology

## 2020-06-21 ENCOUNTER — Other Ambulatory Visit: Payer: Self-pay

## 2020-06-21 VITALS — BP 129/84 | HR 76 | Temp 96.8°F | Resp 16 | Ht 62.0 in | Wt 184.0 lb

## 2020-06-21 DIAGNOSIS — N736 Female pelvic peritoneal adhesions (postinfective): Secondary | ICD-10-CM

## 2020-06-21 DIAGNOSIS — N83202 Unspecified ovarian cyst, left side: Secondary | ICD-10-CM

## 2020-06-21 DIAGNOSIS — Z86711 Personal history of pulmonary embolism: Secondary | ICD-10-CM

## 2020-06-21 MED ORDER — OXYCODONE HCL 5 MG PO TABS: 5.0000 mg | ORAL_TABLET | Freq: Four times a day (QID) | ORAL | 0 refills | Status: DC | PRN

## 2020-06-21 MED ORDER — NEOMYCIN SULFATE 500 MG PO TABS: ORAL_TABLET | ORAL | 0 refills | Status: DC

## 2020-06-21 MED ORDER — ENOXAPARIN SODIUM 40 MG/0.4ML ~~LOC~~ SOLN: 40.0000 mg | SUBCUTANEOUS | 0 refills | Status: DC

## 2020-06-21 MED ORDER — IBUPROFEN 600 MG PO TABS: 600.0000 mg | ORAL_TABLET | Freq: Four times a day (QID) | ORAL | 1 refills | Status: DC | PRN

## 2020-06-21 MED ORDER — ERYTHROMYCIN BASE 500 MG PO TABS: ORAL_TABLET | ORAL | 0 refills | Status: DC

## 2020-06-21 MED FILL — NEOMYCIN 500 MG TABLET: 500 | 6 days supply | Qty: 6 | Fill #0

## 2020-06-21 MED FILL — IBUPROFEN 600 MG TABLET: 600 | 7 days supply | Qty: 30 | Fill #0

## 2020-06-21 MED FILL — ENOXAPARIN SODIUM 40 MG/0.4: 40 | 14 days supply | Qty: 6 | Fill #0

## 2020-06-21 MED FILL — oxyCODONE HCL 5 MG TABS: 5 | 3 days supply | Qty: 15 | Fill #0

## 2020-06-21 MED FILL — ERYTHROMYCIN BASE 500 MG TA: 500 | 1 days supply | Qty: 6 | Fill #0

## 2020-06-25 ENCOUNTER — Telehealth: Payer: Self-pay

## 2020-06-25 ENCOUNTER — Other Ambulatory Visit: Payer: Self-pay

## 2020-06-25 ENCOUNTER — Ambulatory Visit (INDEPENDENT_AMBULATORY_CARE_PROVIDER_SITE_OTHER): Payer: Self-pay | Admitting: Obstetrics and Gynecology

## 2020-06-25 ENCOUNTER — Encounter: Payer: Self-pay | Admitting: Obstetrics and Gynecology

## 2020-06-25 VITALS — BP 130/87 | HR 82 | Ht 62.0 in | Wt 183.7 lb

## 2020-06-25 DIAGNOSIS — Z09 Encounter for follow-up examination after completed treatment for conditions other than malignant neoplasm: Secondary | ICD-10-CM

## 2020-06-25 NOTE — Progress Notes (Signed)
[facility] 06/25/2020  CC: regular post op visit  Subjective:     Kara Haynes is a 29 y.o. s/p 9/21 diagnostic laparoscopy for complex adnexa cyst. Washings obtained (negative) and procedure aborted due to concern for malignancy vs endo. Pt is set up for 10/19 surgery with Gyn Onc  Patient is healing well from the surgery   Review of Systems Pertinent items are noted in HPI.    Objective:    BP 130/87   Pulse 82   Ht 5\' 2"  (1.575 m)   Wt 183 lb 11.2 oz (83.3 kg)   BMI 33.60 kg/m  NAD  Assessment:    Doing well postoperatively. Operative findings again reviewed. Pathology report discussed.    Plan:  D/w her re: if concern for malignancy that will be followed by [facility] but if benign pathology/endo then will come back to me for follow up  RTC: based on OR findings  MD Attending [facility] [employer])

## 2020-06-25 NOTE — Telephone Encounter (Signed)
TC to [employer] for status check on application for Lovenox.  Patient's application is in process.  Lovenox 40mg  is out of stock per representative.  , NP notified.

## 2020-06-25 NOTE — Progress Notes (Addendum)
COVID Vaccine Completed:  No Date COVID Vaccine completed: COVID vaccine manufacturer: Pfizer    Moderna   [employer]   PCP - Evlyn Kanner, MD Cardiologist -   Chest x-ray -  EKG -  Stress Test -  ECHO -  Cardiac Cath -  Pacemaker/ICD device last checked:  Sleep Study -  CPAP -   Fasting Blood Sugar -  Checks Blood Sugar _____ times a day  Blood Thinner Instructions: Aspirin Instructions: Last Dose:  Anesthesia review:   Patient denies shortness of breath, fever, cough and chest pain at PAT appointment   Patient verbalized understanding of instructions that were given to them at the PAT appointment. Patient was also instructed that they will need to review over the PAT instructions again at home before surgery.

## 2020-06-25 NOTE — Patient Instructions (Addendum)
DUE TO COVID-19 ONLY ONE VISITOR IS ALLOWED TO COME WITH YOU AND STAY IN THE WAITING ROOM ONLY  DURING PRE OP AND PROCEDURE.   IF YOU WILL BE ADMITTED INTO THE HOSPITAL YOU ARE ALLOWED ONE SUPPORT PERSON DURING  VISITATION HOURS ONLY (10AM -8PM)   . The support person may change daily. . The support person must pass our screening, gel in and out, and wear a mask at all times, including in the patient's room. . Patients must also wear a mask when staff or their support person are in the room.   COVID SWAB TESTING MUST BE COMPLETED ON:  Friday, 06-29-20 @ 10:10 AM   [address] [place], [place] [address]  (Must self quarantine after testing. Follow instructions on  handout.)    Your procedure is scheduled on:   Tuesday, 07-03-20   Report to [facility] Main  Entrance    Report to admitting at 10:00 AM   Call this number if you have problems the morning of surgery [phone]    Follow a light diet the day before surgery (avoid gas producing foods)   Do not eat food :After Midnight.   May have liquids until 9:00 AM day of surgery  CLEAR LIQUID DIET  Foods Allowed                                                                     Foods Excluded  Water, Black Coffee and tea, regular and decaf            liquids that you cannot  Plain Jell-O in any flavor  (No red)                                  see through such as: Fruit ices (not with fruit pulp)                                      milk, soups, orange juice              Iced Popsicles (No red)                                      All solid food                                   Apple juices Sports drinks like Gatorade (No red) Lightly seasoned clear broth or consume(fat free) Sugar, honey syrup     Oral Hygiene is also important to reduce your risk of infection.                                    Remember - BRUSH YOUR TEETH THE MORNING OF SURGERY WITH YOUR REGULAR TOOTHPASTE   Do NOT smoke after  Midnight   Take these medicines the morning of surgery with A SIP OF WATER:  Pantoprazole.  Okay to use Albuterol inhaler and  bring with you day of surgery                                You may not have any metal on your body including hair pins, jewelry, and body piercings             Do not wear make-up, lotions, powders, perfumes/cologne, or deodorant             Do not wear nail polish.  Do not shave  48 hours prior to surgery.    Do not bring valuables to the hospital. [facility] IS NOT RESPONSIBLE   FOR VALUABLES.   Contacts, dentures or bridgework may not be worn into surgery.      Patients discharged the day of surgery will not be allowed to drive home.                Please read over the following fact sheets you were given: IF YOU HAVE QUESTIONS ABOUT YOUR PRE OP  INSTRUCTIONS PLEASE CALL [phone]   [name] - Preparing for Surgery Before surgery, you can play an important role.  Because skin is not sterile, your skin needs to be as free of germs as possible.  You can reduce the number of germs on your skin by washing with CHG (chlorahexidine gluconate) soap before surgery.  CHG is an antiseptic cleaner which kills germs and bonds with the skin to continue killing germs even after washing. Please DO NOT use if you have an allergy to CHG or antibacterial soaps.  If your skin becomes reddened/irritated stop using the CHG and inform your nurse when you arrive at Short Stay. Do not shave (including legs and underarms) for at least 48 hours prior to the first CHG shower.  You may shave your face/neck.  Please follow these instructions carefully:  1.  Shower with CHG Soap the night before surgery and the  morning of surgery.  2.  If you choose to wash your hair, wash your hair first as usual with your normal  shampoo.  3.  After you shampoo, rinse your hair and body thoroughly to remove the shampoo.                             4.  Use CHG as you would any other liquid soap.   You can apply chg directly to the skin and wash.  Gently with a scrungie or clean washcloth.  5.  Apply the CHG Soap to your body ONLY FROM THE NECK DOWN.   Do   not use on face/ open                           Wound or open sores. Avoid contact with eyes, ears mouth and   genitals (private parts).                       Wash face,  Genitals (private parts) with your normal soap.             6.  Wash thoroughly, paying special attention to the area where your    surgery  will be performed.  7.  Thoroughly rinse your body with warm water from the neck down.  8.  DO NOT shower/wash with  your normal soap after using and rinsing off the CHG Soap.                9.  Pat yourself dry with a clean towel.            10.  Wear clean pajamas.            11.  Place clean sheets on your bed the night of your first shower and do not  sleep with pets. Day of Surgery : Do not apply any lotions/deodorants the morning of surgery.  Please wear clean clothes to the hospital/surgery center.  FAILURE TO FOLLOW THESE INSTRUCTIONS MAY RESULT IN THE CANCELLATION OF YOUR SURGERY  PATIENT SIGNATURE_________________________________  NURSE SIGNATURE__________________________________  ________________________________________________________________________   WHAT IS A BLOOD TRANSFUSION? Blood Transfusion Information  A transfusion is the replacement of blood or some of its parts. Blood is made up of multiple cells which provide different functions.  Red blood cells carry oxygen and are used for blood loss replacement.  White blood cells fight against infection.  Platelets control bleeding.  Plasma helps clot blood.  Other blood products are available for specialized needs, such as hemophilia or other clotting disorders. BEFORE THE TRANSFUSION  Who gives blood for transfusions?   Healthy volunteers who are fully evaluated to make sure their blood is safe. This is blood bank blood. Transfusion therapy is the  safest it has ever been in the practice of medicine. Before blood is taken from a donor, a complete history is taken to make sure that person has no history of diseases nor engages in risky social behavior (examples are intravenous drug use or sexual activity with multiple partners). The donor's travel history is screened to minimize risk of transmitting infections, such as malaria. The donated blood is tested for signs of infectious diseases, such as HIV and hepatitis. The blood is then tested to be sure it is compatible with you in order to minimize the chance of a transfusion reaction. If you or a relative donates blood, this is often done in anticipation of surgery and is not appropriate for emergency situations. It takes many days to process the donated blood. RISKS AND COMPLICATIONS Although transfusion therapy is very safe and saves many lives, the main dangers of transfusion include:   Getting an infectious disease.  Developing a transfusion reaction. This is an allergic reaction to something in the blood you were given. Every precaution is taken to prevent this. The decision to have a blood transfusion has been considered carefully by your caregiver before blood is given. Blood is not given unless the benefits outweigh the risks. AFTER THE TRANSFUSION  Right after receiving a blood transfusion, you will usually feel much better and more energetic. This is especially true if your red blood cells have gotten low (anemic). The transfusion raises the level of the red blood cells which carry oxygen, and this usually causes an energy increase.  The nurse administering the transfusion will monitor you carefully for complications. HOME CARE INSTRUCTIONS  No special instructions are needed after a transfusion. You may find your energy is better. Speak with your caregiver about any limitations on activity for underlying diseases you may have. SEEK MEDICAL CARE IF:   Your condition is not improving  after your transfusion.  You develop redness or irritation at the intravenous (IV) site. SEEK IMMEDIATE MEDICAL CARE IF:  Any of the following symptoms occur over the next 12 hours:  Shaking chills.  You have a temperature  by mouth above 102 F (38.9 C), not controlled by medicine.  Chest, back, or muscle pain.  People around you feel you are not acting correctly or are confused.  Shortness of breath or difficulty breathing.  Dizziness and fainting.  You get a rash or develop hives.  You have a decrease in urine output.  Your urine turns a dark color or changes to pink, red, or brown. Any of the following symptoms occur over the next 10 days:  You have a temperature by mouth above 102 F (38.9 C), not controlled by medicine.  Shortness of breath.  Weakness after normal activity.  The white part of the eye turns yellow (jaundice).  You have a decrease in the amount of urine or are urinating less often.  Your urine turns a dark color or changes to pink, red, or brown. Document Released: 08/29/2000 Document Revised: 11/24/2011 Document Reviewed: 04/17/2008 [facility] Patient Information 2014 [place], [place].  _______________________________________________________________________

## 2020-06-25 NOTE — Telephone Encounter (Signed)
New rx for Lovenox 30mg /0.57ml sent to [facility].  Spoke to [facility], application is in process.  Will call back Wednesday for update/status.

## 2020-06-26 ENCOUNTER — Telehealth: Payer: Self-pay | Admitting: *Deleted

## 2020-06-26 MED FILL — ENOXAPARIN SODIUM 40 MG/0.4: 40 | 14 days supply | Qty: 6 | Fill #0 | Status: TO

## 2020-06-26 MED FILL — IBUPROFEN 600 MG TABLET: 600 | 7 days supply | Qty: 30 | Fill #0 | Status: TO

## 2020-06-26 MED FILL — ERYTHROMYCIN BASE 500 MG TA: 500 | 1 days supply | Qty: 6 | Fill #0 | Status: TO

## 2020-06-26 MED FILL — NEOMYCIN 500 MG TABLET: 500 | 6 days supply | Qty: 6 | Fill #0 | Status: TO

## 2020-06-26 NOTE — Care Management (Signed)
CM received notifcation from K. Hess that patient will be needing medications after surgery and patient has no insurance.  CM spoke to supervisor Nehemiah Settle and approval was given for patient to receive MATCH program through the [facility] department.  Medications will be filled at the Outpatient Pharmacy at [facility]- CM in touch with Almon Register and 4 prescriptions will be filled at no cost to the patient, 2 antibiotics, motrin and lovenox.  Message sent back to K. Hess.      Gretchen Short RNC-MNN, BSN Transitions [facility]

## 2020-06-27 ENCOUNTER — Other Ambulatory Visit: Payer: Self-pay | Admitting: Gynecologic Oncology

## 2020-06-27 ENCOUNTER — Other Ambulatory Visit: Payer: Self-pay

## 2020-06-27 ENCOUNTER — Encounter (HOSPITAL_COMMUNITY)
Admission: RE | Admit: 2020-06-27 | Discharge: 2020-06-27 | Disposition: A | Discharge status: Home / Self Care | Payer: Self-pay | Source: Ambulatory Visit | Attending: Gynecologic Oncology | Admitting: Gynecologic Oncology

## 2020-06-27 ENCOUNTER — Telehealth: Payer: Self-pay | Admitting: Oncology

## 2020-06-27 ENCOUNTER — Encounter (HOSPITAL_COMMUNITY): Payer: Self-pay

## 2020-06-27 DIAGNOSIS — N83202 Unspecified ovarian cyst, left side: Secondary | ICD-10-CM

## 2020-06-27 DIAGNOSIS — Z01812 Encounter for preprocedural laboratory examination: Secondary | ICD-10-CM | POA: Insufficient documentation

## 2020-06-27 HISTORY — DX: Pneumonia, unspecified organism: J18.9

## 2020-06-27 LAB — CBC
HCT: 42 % (ref 36.0–46.0)
Hemoglobin: 14.5 g/dL (ref 12.0–15.0)
MCH: 33.8 pg (ref 26.0–34.0)
MCHC: 34.5 g/dL (ref 30.0–36.0)
MCV: 97.9 fL (ref 80.0–100.0)
Platelets: 300 10*3/uL (ref 150–400)
RBC: 4.29 MIL/uL (ref 3.87–5.11)
RDW: 14.8 % (ref 11.5–15.5)
WBC: 3.7 10*3/uL — ABNORMAL LOW (ref 4.0–10.5)
nRBC: 0 % (ref 0.0–0.2)

## 2020-06-27 LAB — HEPATIC FUNCTION PANEL
ALT: 13 U/L (ref 0–44)
AST: 20 U/L (ref 15–41)
Albumin: 3.9 g/dL (ref 3.5–5.0)
Alkaline Phosphatase: 59 U/L (ref 38–126)
Bilirubin, Direct: 0.2 mg/dL (ref 0.0–0.2)
Indirect Bilirubin: 1.8 mg/dL — ABNORMAL HIGH (ref 0.3–0.9)
Total Bilirubin: 2 mg/dL — ABNORMAL HIGH (ref 0.3–1.2)
Total Protein: 7.1 g/dL (ref 6.5–8.1)

## 2020-06-27 LAB — URINALYSIS, ROUTINE W REFLEX MICROSCOPIC
Bilirubin Urine: NEGATIVE
Glucose, UA: NEGATIVE mg/dL
Ketones, ur: NEGATIVE mg/dL
Nitrite: NEGATIVE
Protein, ur: NEGATIVE mg/dL
Specific Gravity, Urine: 1.014 (ref 1.005–1.030)
pH: 7 (ref 5.0–8.0)

## 2020-06-27 LAB — BASIC METABOLIC PANEL
Anion gap: 11 (ref 5–15)
BUN: 10 mg/dL (ref 6–20)
CO2: 26 mmol/L (ref 22–32)
Calcium: 9.3 mg/dL (ref 8.9–10.3)
Chloride: 100 mmol/L (ref 98–111)
Creatinine, Ser: 0.77 mg/dL (ref 0.44–1.00)
GFR, Estimated: >60 mL/min (ref >60)
Glucose, Bld: 98 mg/dL (ref 70–99)
Potassium: 4.2 mmol/L (ref 3.5–5.1)
Sodium: 137 mmol/L (ref 135–145)

## 2020-06-27 MED ORDER — OXYCODONE HCL 5 MG PO TABS
5.0000 mg | ORAL_TABLET | Freq: Four times a day (QID) | ORAL | 0 refills | Status: DC | PRN
Start: 2020-06-27 — End: 2020-11-02

## 2020-06-27 MED FILL — IBUPROFEN 600 MG TABLET: 600 | 8 days supply | Qty: 30 | Fill #0

## 2020-06-27 MED FILL — ENOXAPARIN SODIUM 40 MG/0.4: 40 | 14 days supply | Qty: 6 | Fill #0

## 2020-06-27 MED FILL — NEOMYCIN 500 MG TABLET: 500 | 1 days supply | Qty: 6 | Fill #0

## 2020-06-27 MED FILL — ERYTHROMYCIN BASE 500 MG TA: 500 | 1 days supply | Qty: 6 | Fill #0

## 2020-06-27 NOTE — Telephone Encounter (Signed)
Called Kara Haynes and advised her that her post op medications have been paid for through the Match program and that they are available for pick up today at the [facility].  Provided her with the address for the pharmacy.  She verbalized understanding and agreement.  Also discussed that the pain medication - oxycodone is not paid for through the program.  She would like the prescription sent to the [facility] on [address].

## 2020-06-29 ENCOUNTER — Other Ambulatory Visit (HOSPITAL_COMMUNITY)
Admission: RE | Admit: 2020-06-29 | Discharge: 2020-06-29 | Disposition: A | Discharge status: Home / Self Care | Payer: Self-pay | Source: Ambulatory Visit | Attending: Gynecologic Oncology | Admitting: Gynecologic Oncology

## 2020-06-29 DIAGNOSIS — Z20822 Contact with and (suspected) exposure to covid-19: Secondary | ICD-10-CM | POA: Insufficient documentation

## 2020-06-29 DIAGNOSIS — Z01812 Encounter for preprocedural laboratory examination: Secondary | ICD-10-CM | POA: Insufficient documentation

## 2020-06-29 LAB — SARS CORONAVIRUS 2 (TAT 6-24 HRS): SARS Coronavirus 2: NEGATIVE

## 2020-07-02 ENCOUNTER — Telehealth: Payer: Self-pay

## 2020-07-02 NOTE — Telephone Encounter (Signed)
Reviewed written pre op instructions with Ms Kara Haynes. She verbalized instructions correctly. Time now 0930 to arrive at [facility]. Surgery moved to 1130. She inquired about the alcohol content in the cherry flavor ing of generic mag-citrate. It has a 0.25%contant. Told her Dr. Andrey Farmer was ok with that amount. Pt stated that she had a few acholic drinks on Friday 06-29-20 but none since then. She has all of her medications including the Lovenox at home.  A friend will come by the [facility] today between 1 pm-3 pm to pick up 3 pre-surgery drinks. She is to take 2 at 10 pm and one in the am 07-03-20 when is get up and by 0830 am per [facility].

## 2020-07-02 NOTE — Progress Notes (Signed)
Ms. Kara Haynes made aware to arrive at 9:30AM and stop liquids at 8:30AM. She verbalized understanding.

## 2020-07-03 ENCOUNTER — Encounter (HOSPITAL_COMMUNITY): Payer: Self-pay | Admitting: Gynecologic Oncology

## 2020-07-03 ENCOUNTER — Encounter (HOSPITAL_COMMUNITY)
Admission: RE | Disposition: A | Discharge status: Home / Self Care | Payer: Self-pay | Source: Home / Self Care | Attending: Gynecologic Oncology

## 2020-07-03 ENCOUNTER — Ambulatory Visit (HOSPITAL_COMMUNITY): Payer: Self-pay | Admitting: Certified Registered Nurse Anesthetist

## 2020-07-03 ENCOUNTER — Ambulatory Visit (HOSPITAL_COMMUNITY)
Admission: RE | Admit: 2020-07-03 | Discharge: 2020-07-03 | Disposition: A | Discharge status: Home / Self Care | Payer: Self-pay | Attending: Gynecologic Oncology | Admitting: Gynecologic Oncology

## 2020-07-03 DIAGNOSIS — Z6833 Body mass index (BMI) 33.0-33.9, adult: Secondary | ICD-10-CM | POA: Insufficient documentation

## 2020-07-03 DIAGNOSIS — Z86711 Personal history of pulmonary embolism: Secondary | ICD-10-CM | POA: Insufficient documentation

## 2020-07-03 DIAGNOSIS — K219 Gastro-esophageal reflux disease without esophagitis: Secondary | ICD-10-CM | POA: Insufficient documentation

## 2020-07-03 DIAGNOSIS — N801 Endometriosis of ovary: Secondary | ICD-10-CM | POA: Insufficient documentation

## 2020-07-03 DIAGNOSIS — N83202 Unspecified ovarian cyst, left side: Secondary | ICD-10-CM

## 2020-07-03 DIAGNOSIS — F17210 Nicotine dependence, cigarettes, uncomplicated: Secondary | ICD-10-CM | POA: Insufficient documentation

## 2020-07-03 DIAGNOSIS — E669 Obesity, unspecified: Secondary | ICD-10-CM | POA: Insufficient documentation

## 2020-07-03 DIAGNOSIS — N736 Female pelvic peritoneal adhesions (postinfective): Secondary | ICD-10-CM | POA: Insufficient documentation

## 2020-07-03 DIAGNOSIS — R971 Elevated cancer antigen 125 [CA 125]: Secondary | ICD-10-CM | POA: Insufficient documentation

## 2020-07-03 DIAGNOSIS — Z91013 Allergy to seafood: Secondary | ICD-10-CM | POA: Insufficient documentation

## 2020-07-03 DIAGNOSIS — Z79899 Other long term (current) drug therapy: Secondary | ICD-10-CM | POA: Insufficient documentation

## 2020-07-03 DIAGNOSIS — N946 Dysmenorrhea, unspecified: Secondary | ICD-10-CM | POA: Insufficient documentation

## 2020-07-03 DIAGNOSIS — N135 Crossing vessel and stricture of ureter without hydronephrosis: Secondary | ICD-10-CM

## 2020-07-03 DIAGNOSIS — J4520 Mild intermittent asthma, uncomplicated: Secondary | ICD-10-CM | POA: Insufficient documentation

## 2020-07-03 HISTORY — PX: ROBOTIC ASSISTED TOTAL HYSTERECTOMY WITH BILATERAL SALPINGO OOPHERECTOMY: SHX6086

## 2020-07-03 HISTORY — DX: Female pelvic peritoneal adhesions (postinfective): N73.6

## 2020-07-03 LAB — TYPE AND SCREEN
ABO/RH(D): B POS
Antibody Screen: NEGATIVE

## 2020-07-03 LAB — PREGNANCY, URINE: Preg Test, Ur: NEGATIVE

## 2020-07-03 LAB — ABO/RH: ABO/RH(D): B POS

## 2020-07-03 SURGERY — HYSTERECTOMY, TOTAL, ROBOT-ASSISTED, LAPAROSCOPIC, WITH BILATERAL SALPINGO-OOPHORECTOMY
Anesthesia: General

## 2020-07-03 MED ORDER — CELECOXIB 200 MG PO CAPS: 400.0000 mg | ORAL_CAPSULE | ORAL | Status: AC

## 2020-07-03 MED ORDER — MIDAZOLAM HCL 2 MG/2ML IJ SOLN
INTRAMUSCULAR | Status: DC | PRN
  Administered 2020-07-03: 2 mg via INTRAVENOUS

## 2020-07-03 MED ORDER — CELECOXIB 200 MG PO CAPS
ORAL_CAPSULE | ORAL | Status: AC
  Administered 2020-07-03: 400 mg via ORAL
  Filled 2020-07-03: qty 2

## 2020-07-03 MED ORDER — STERILE WATER FOR INJECTION IJ SOLN
INTRAMUSCULAR | Status: AC
  Filled 2020-07-03: qty 10

## 2020-07-03 MED ORDER — CHLORHEXIDINE GLUCONATE 0.12 % MT SOLN
15.0000 mL | Freq: Once | OROMUCOSAL | Status: AC
  Administered 2020-07-03: 15 mL via OROMUCOSAL

## 2020-07-03 MED ORDER — ENSURE PRE-SURGERY PO LIQD: 296.0000 mL | Freq: Once | ORAL | Status: DC

## 2020-07-03 MED ORDER — MIDAZOLAM HCL 2 MG/2ML IJ SOLN
INTRAMUSCULAR | Status: AC
  Filled 2020-07-03: qty 2

## 2020-07-03 MED ORDER — DEXAMETHASONE SODIUM PHOSPHATE 4 MG/ML IJ SOLN: 4.0000 mg | INTRAMUSCULAR | Status: DC

## 2020-07-03 MED ORDER — DEXMEDETOMIDINE (PRECEDEX) IN NS 20 MCG/5ML (4 MCG/ML) IV SYRINGE
PREFILLED_SYRINGE | INTRAVENOUS | Status: DC | PRN
  Administered 2020-07-03 (×2): 10 ug via INTRAVENOUS

## 2020-07-03 MED ORDER — GABAPENTIN 300 MG PO CAPS: 300.0000 mg | ORAL_CAPSULE | ORAL | Status: AC

## 2020-07-03 MED ORDER — KETOROLAC TROMETHAMINE 30 MG/ML IJ SOLN
INTRAMUSCULAR | Status: AC
  Filled 2020-07-03: qty 1

## 2020-07-03 MED ORDER — DEXAMETHASONE SODIUM PHOSPHATE 10 MG/ML IJ SOLN
INTRAMUSCULAR | Status: AC
  Filled 2020-07-03: qty 1

## 2020-07-03 MED ORDER — ROCURONIUM BROMIDE 10 MG/ML (PF) SYRINGE
PREFILLED_SYRINGE | INTRAVENOUS | Status: DC | PRN
  Administered 2020-07-03: 60 mg via INTRAVENOUS

## 2020-07-03 MED ORDER — SCOPOLAMINE 1 MG/3DAYS TD PT72
MEDICATED_PATCH | TRANSDERMAL | Status: AC
  Administered 2020-07-03: 1.5 mg via TRANSDERMAL
  Filled 2020-07-03: qty 1

## 2020-07-03 MED ORDER — FENTANYL CITRATE (PF) 250 MCG/5ML IJ SOLN
INTRAMUSCULAR | Status: AC
  Filled 2020-07-03: qty 5

## 2020-07-03 MED ORDER — ONDANSETRON HCL 4 MG/2ML IJ SOLN
INTRAMUSCULAR | Status: AC
  Filled 2020-07-03: qty 2

## 2020-07-03 MED ORDER — KETAMINE HCL 10 MG/ML IJ SOLN
INTRAMUSCULAR | Status: DC | PRN
  Administered 2020-07-03: 40 mg via INTRAVENOUS

## 2020-07-03 MED ORDER — MEPERIDINE HCL 50 MG/ML IJ SOLN
6.2500 mg | INTRAMUSCULAR | Status: DC | PRN
  Administered 2020-07-03: 6.25 mg via INTRAVENOUS

## 2020-07-03 MED ORDER — FENTANYL CITRATE (PF) 250 MCG/5ML IJ SOLN
INTRAMUSCULAR | Status: DC | PRN
Start: 2020-07-03 — End: 2020-07-03
  Administered 2020-07-03 (×2): 50 ug via INTRAVENOUS
  Administered 2020-07-03: 100 ug via INTRAVENOUS
  Administered 2020-07-03: 50 ug via INTRAVENOUS
  Administered 2020-07-03 (×2): 100 ug via INTRAVENOUS
  Administered 2020-07-03: 50 ug via INTRAVENOUS

## 2020-07-03 MED ORDER — ENOXAPARIN SODIUM 40 MG/0.4ML ~~LOC~~ SOLN
SUBCUTANEOUS | Status: AC
  Administered 2020-07-03: 40 mg via SUBCUTANEOUS
  Filled 2020-07-03: qty 0.4

## 2020-07-03 MED ORDER — GABAPENTIN 300 MG PO CAPS
ORAL_CAPSULE | ORAL | Status: AC
  Administered 2020-07-03: 300 mg via ORAL
  Filled 2020-07-03: qty 1

## 2020-07-03 MED ORDER — ENOXAPARIN SODIUM 40 MG/0.4ML ~~LOC~~ SOLN: 40.0000 mg | SUBCUTANEOUS | Status: AC

## 2020-07-03 MED ORDER — HYDROMORPHONE HCL 1 MG/ML IJ SOLN
0.2500 mg | INTRAMUSCULAR | Status: DC | PRN
  Administered 2020-07-03: 0.25 mg via INTRAVENOUS

## 2020-07-03 MED ORDER — ACETAMINOPHEN 500 MG PO TABS: 1000.0000 mg | ORAL_TABLET | ORAL | Status: AC

## 2020-07-03 MED ORDER — ROCURONIUM BROMIDE 10 MG/ML (PF) SYRINGE
PREFILLED_SYRINGE | INTRAVENOUS | Status: AC
  Filled 2020-07-03: qty 10

## 2020-07-03 MED ORDER — PROPOFOL 10 MG/ML IV BOLUS
INTRAVENOUS | Status: DC | PRN
  Administered 2020-07-03: 200 mg via INTRAVENOUS

## 2020-07-03 MED ORDER — BUPIVACAINE HCL 0.25 % IJ SOLN
INTRAMUSCULAR | Status: DC | PRN
  Administered 2020-07-03: 20 mL

## 2020-07-03 MED ORDER — PROPOFOL 10 MG/ML IV BOLUS
INTRAVENOUS | Status: AC
  Filled 2020-07-03: qty 20

## 2020-07-03 MED ORDER — HYDROMORPHONE HCL 1 MG/ML IJ SOLN
INTRAMUSCULAR | Status: AC
  Filled 2020-07-03: qty 1

## 2020-07-03 MED ORDER — PROMETHAZINE HCL 25 MG/ML IJ SOLN: 6.2500 mg | INTRAMUSCULAR | Status: DC | PRN

## 2020-07-03 MED ORDER — KETAMINE HCL 10 MG/ML IJ SOLN
INTRAMUSCULAR | Status: AC
  Filled 2020-07-03: qty 1

## 2020-07-03 MED ORDER — LIDOCAINE HCL (CARDIAC) PF 100 MG/5ML IV SOSY
PREFILLED_SYRINGE | INTRAVENOUS | Status: DC | PRN
  Administered 2020-07-03: 80 mg via INTRAVENOUS

## 2020-07-03 MED ORDER — LACTATED RINGERS IR SOLN
Status: DC | PRN
  Administered 2020-07-03: 1000 mL

## 2020-07-03 MED ORDER — LACTATED RINGERS IV SOLN: INTRAVENOUS | Status: DC

## 2020-07-03 MED ORDER — ONDANSETRON HCL 4 MG/2ML IJ SOLN
INTRAMUSCULAR | Status: DC | PRN
  Administered 2020-07-03: 4 mg via INTRAVENOUS

## 2020-07-03 MED ORDER — MEPERIDINE HCL 50 MG/ML IJ SOLN
INTRAMUSCULAR | Status: AC
  Filled 2020-07-03: qty 1

## 2020-07-03 MED ORDER — ORAL CARE MOUTH RINSE: 15.0000 mL | Freq: Once | OROMUCOSAL | Status: AC

## 2020-07-03 MED ORDER — ENSURE PRE-SURGERY PO LIQD: 592.0000 mL | Freq: Once | ORAL | Status: DC

## 2020-07-03 MED ORDER — KETOROLAC TROMETHAMINE 15 MG/ML IJ SOLN: 15.0000 mg | Freq: Once | INTRAMUSCULAR | Status: DC | PRN

## 2020-07-03 MED ORDER — BUPIVACAINE HCL 0.25 % IJ SOLN
INTRAMUSCULAR | Status: AC
  Filled 2020-07-03: qty 1

## 2020-07-03 MED ORDER — SCOPOLAMINE 1 MG/3DAYS TD PT72: 1.0000 | MEDICATED_PATCH | TRANSDERMAL | Status: DC

## 2020-07-03 MED ORDER — SUGAMMADEX SODIUM 200 MG/2ML IV SOLN
INTRAVENOUS | Status: DC | PRN
  Administered 2020-07-03: 170 mg via INTRAVENOUS

## 2020-07-03 MED ORDER — DEXMEDETOMIDINE (PRECEDEX) IN NS 20 MCG/5ML (4 MCG/ML) IV SYRINGE
PREFILLED_SYRINGE | INTRAVENOUS | Status: AC
  Filled 2020-07-03: qty 5

## 2020-07-03 MED ORDER — SODIUM CHLORIDE 0.9% FLUSH: 3.0000 mL | Freq: Two times a day (BID) | INTRAVENOUS | Status: DC

## 2020-07-03 MED ORDER — LIDOCAINE 2% (20 MG/ML) 5 ML SYRINGE
INTRAMUSCULAR | Status: AC
  Filled 2020-07-03: qty 5

## 2020-07-03 MED ORDER — DEXAMETHASONE SODIUM PHOSPHATE 10 MG/ML IJ SOLN
INTRAMUSCULAR | Status: DC | PRN
  Administered 2020-07-03: 5 mg via INTRAVENOUS

## 2020-07-03 MED ORDER — SODIUM CHLORIDE 0.9 % IV SOLN
2.0000 g | INTRAVENOUS | Status: AC
  Administered 2020-07-03: 2 g via INTRAVENOUS
  Filled 2020-07-03: qty 2

## 2020-07-03 MED ORDER — ACETAMINOPHEN 500 MG PO TABS
ORAL_TABLET | ORAL | Status: AC
  Administered 2020-07-03: 1000 mg via ORAL
  Filled 2020-07-03: qty 2

## 2020-07-03 SURGICAL SUPPLY — 67 items
ADH SKN CLS APL DERMABOND .7 (GAUZE/BANDAGES/DRESSINGS) ×1
AGENT HMST KT MTR STRL THRMB (HEMOSTASIS)
APL ESCP 34 STRL LF DISP (HEMOSTASIS) ×1
APPLICATOR SURGIFLO ENDO (HEMOSTASIS) ×1 IMPLANT
BACTOSHIELD CHG 4% 4OZ (MISCELLANEOUS) ×1
BAG LAPAROSCOPIC 12 15 PORT 16 (BASKET) IMPLANT
BAG RETRIEVAL 12/15 (BASKET)
BAG SPEC RTRVL LRG 6X4 10 (ENDOMECHANICALS) ×1
BLADE SURG SZ10 CARB STEEL (BLADE) IMPLANT
COVER BACK TABLE 60X90IN (DRAPES) ×2 IMPLANT
COVER TIP SHEARS 8 DVNC (MISCELLANEOUS) ×1 IMPLANT
COVER TIP SHEARS 8MM DA VINCI (MISCELLANEOUS) ×2
COVER WAND RF STERILE (DRAPES) IMPLANT
DECANTER SPIKE VIAL GLASS SM (MISCELLANEOUS) ×1 IMPLANT
DERMABOND ADVANCED (GAUZE/BANDAGES/DRESSINGS) ×1
DERMABOND ADVANCED .7 DNX12 (GAUZE/BANDAGES/DRESSINGS) ×1 IMPLANT
DRAPE ARM DVNC X/XI (DISPOSABLE) ×4 IMPLANT
DRAPE COLUMN DVNC XI (DISPOSABLE) ×1 IMPLANT
DRAPE DA VINCI XI ARM (DISPOSABLE) ×8
DRAPE DA VINCI XI COLUMN (DISPOSABLE) ×2
DRAPE SHEET LG 3/4 BI-LAMINATE (DRAPES) ×2 IMPLANT
DRAPE SURG IRRIG POUCH 19X23 (DRAPES) ×2 IMPLANT
DRSG OPSITE POSTOP 4X6 (GAUZE/BANDAGES/DRESSINGS) IMPLANT
DRSG OPSITE POSTOP 4X8 (GAUZE/BANDAGES/DRESSINGS) IMPLANT
ELECT PENCIL ROCKER SW 15FT (MISCELLANEOUS) IMPLANT
ELECT REM PT RETURN 15FT ADLT (MISCELLANEOUS) ×2 IMPLANT
GAUZE 4X4 16PLY RFD (DISPOSABLE) ×1 IMPLANT
GLOVE BIO SURGEON STRL SZ 6 (GLOVE) ×8 IMPLANT
GLOVE BIO SURGEON STRL SZ 6.5 (GLOVE) ×4 IMPLANT
GOWN STRL REUS W/ TWL LRG LVL3 (GOWN DISPOSABLE) ×4 IMPLANT
GOWN STRL REUS W/TWL LRG LVL3 (GOWN DISPOSABLE) ×8
HOLDER FOLEY CATH W/STRAP (MISCELLANEOUS) ×2 IMPLANT
IRRIG SUCT STRYKERFLOW 2 WTIP (MISCELLANEOUS) ×2
IRRIGATION SUCT STRKRFLW 2 WTP (MISCELLANEOUS) ×1 IMPLANT
KIT TURNOVER KIT A (KITS) IMPLANT
MANIPULATOR UTERINE 4.5 ZUMI (MISCELLANEOUS) ×2 IMPLANT
NEEDLE HYPO 22GX1.5 SAFETY (NEEDLE) ×2 IMPLANT
OBTURATOR OPTICAL STANDARD 8MM (TROCAR) ×2
OBTURATOR OPTICAL STND 8 DVNC (TROCAR) ×1
OBTURATOR OPTICALSTD 8 DVNC (TROCAR) ×1 IMPLANT
PACK ROBOT GYN CUSTOM WL (TRAY / TRAY PROCEDURE) ×2 IMPLANT
PAD POSITIONING PINK XL (MISCELLANEOUS) ×2 IMPLANT
PORT ACCESS TROCAR AIRSEAL 12 (TROCAR) ×1 IMPLANT
PORT ACCESS TROCAR AIRSEAL 5M (TROCAR) ×1
POUCH SPECIMEN RETRIEVAL 10MM (ENDOMECHANICALS) ×1 IMPLANT
SCRUB CHG 4% DYNA-HEX 4OZ (MISCELLANEOUS) ×1 IMPLANT
SEAL CANN UNIV 5-8 DVNC XI (MISCELLANEOUS) ×3 IMPLANT
SEAL XI 5MM-8MM UNIVERSAL (MISCELLANEOUS) ×8
SET TRI-LUMEN FLTR TB AIRSEAL (TUBING) ×2 IMPLANT
SPONGE LAP 18X18 RF (DISPOSABLE) IMPLANT
SURGIFLO W/THROMBIN 8M KIT (HEMOSTASIS) IMPLANT
SUT MNCRL AB 4-0 PS2 18 (SUTURE) IMPLANT
SUT PDS AB 1 TP1 96 (SUTURE) IMPLANT
SUT VIC AB 0 CT1 27 (SUTURE)
SUT VIC AB 0 CT1 27XBRD ANTBC (SUTURE) IMPLANT
SUT VIC AB 2-0 CT1 27 (SUTURE)
SUT VIC AB 2-0 CT1 TAPERPNT 27 (SUTURE) IMPLANT
SUT VIC AB 4-0 PS2 18 (SUTURE) ×4 IMPLANT
SYR 10ML LL (SYRINGE) IMPLANT
SYR 20ML LL LF (SYRINGE) IMPLANT
SYR BULB IRRIG 60ML STRL (SYRINGE) ×1 IMPLANT
TOWEL OR NON WOVEN STRL DISP B (DISPOSABLE) ×2 IMPLANT
TRAP SPECIMEN MUCUS 40CC (MISCELLANEOUS) ×1 IMPLANT
TRAY FOLEY MTR SLVR 16FR STAT (SET/KITS/TRAYS/PACK) ×2 IMPLANT
TROCAR XCEL NON-BLD 5MMX100MML (ENDOMECHANICALS) IMPLANT
UNDERPAD 30X36 HEAVY ABSORB (UNDERPADS AND DIAPERS) ×2 IMPLANT
WATER STERILE IRR 1000ML POUR (IV SOLUTION) ×2 IMPLANT

## 2020-07-03 NOTE — Discharge Instructions (Signed)
Return to work: 3 days to 1 week.  Activity: 1. Be up and out of the bed during the day.  Take a nap if needed.  You may walk up steps but be careful and use the hand rail.  Stair climbing will tire you more than you think, you may need to stop part way and rest.   2. No lifting or straining for 2 weeks.  3. No driving for 3 days.  Do Not drive if you are taking narcotic pain medicine.  4. Shower daily.  Use soap and water on your incision and pat dry; don't rub.     Medications:  - Take ibuprofen and tylenol first line for pain control. Take these regularly (every 6 hours) to decrease the build up of pain.  - If necessary, for severe pain not relieved by ibuprofen, contact Dr Oliver Hum office and you will be prescribed percocet.  - While taking percocet you should take sennakot every night to reduce the likelihood of constipation. If this causes diarrhea, stop its use.  Diet: 1. Low sodium Heart Healthy Diet is recommended.  2. It is safe to use a laxative if you have difficulty moving your bowels.   Wound Care: 1. Keep clean and dry.  Shower daily.  Reasons to call the Doctor:   Fever - Oral temperature greater than 100.4 degrees Fahrenheit  Foul-smelling vaginal discharge  Difficulty urinating  Nausea and vomiting  Increased pain at the site of the incision that is unrelieved with pain medicine.  Difficulty breathing with or without chest pain  New calf pain especially if only on one side  Sudden, continuing increased vaginal bleeding with or without clots.   Follow-up: 1. See Adolphus Birchwood in 4 weeks.  Contacts: For questions or concerns you should contact:  Dr. Adolphus Birchwood at [phone] After hours and on week-ends call [phone] and ask to speak to the physician on call for Gynecologic Oncology   Unilateral Salpingo-Oophorectomy, Care After This sheet gives you information about how to care for yourself after your procedure. Your health care provider may  also give you more specific instructions. If you have problems or questions, contact your health care provider. What can I expect after the procedure? After the procedure, it is common to have:  Abdominal pain.  Some occasional vaginal bleeding (spotting).  Tiredness. Follow these instructions at home: Incision care   Keep your incision area and your bandage (dressing) clean and dry.  Follow instructions from your health care provider about how to take care of your incision. Make sure you: ? Wash your hands with soap and water before you change your dressing. If soap and water are not available, use hand sanitizer. ? Change your dressing as told by your health care provider. ? Leave stitches (sutures), staples, skin glue, or adhesive strips in place. These skin closures may need to stay in place for 2 weeks or longer. If adhesive strip edges start to loosen and curl up, you may trim the loose edges. Do not remove adhesive strips completely unless your health care provider tells you to do that.  Check your incision area every day for signs of infection. Check for: ? Redness, swelling, or pain. ? Fluid or blood. ? Warmth. ? Pus or a bad smell. Activity  Do not drive or use heavy machinery while taking prescription pain medicine.  Do not drive for 24 hours if you received a medicine to help you relax (sedative).  Take frequent, short walks  throughout the day. Rest when you get tired. Ask your health care provider what activities are safe for you.  Avoid activities that require great effort. Also, avoid heavy lifting. Do not lift anything that is heavier than 5 lb (2.3 kg), or the limit that your health care provider tells you, until he or she says that it is safe to do so.  Do not douche, use tampons, or have sex until your health care provider approves. General instructions  To prevent or treat constipation while you are taking prescription pain medicine, your health care provider  may recommend that you: ? Drink enough fluid to keep your urine pale yellow. ? Take over-the-counter or prescription medicines. ? Eat foods that are high in fiber, such as fresh fruits and vegetables, whole grains, and beans. ? Limit foods that are high in fat and processed sugars, such as fried and sweet foods.  Take over-the-counter and prescription medicines only as told by your health care provider.  Do not take baths, swim, or use a hot tub until your health care provider approves. Ask your health care provider if you may take showers. You may only be allowed to take sponge baths.  Wear compression stockings as told by your health care provider. These stockings help to prevent blood clots and reduce swelling in your legs.  Keep all follow-up visits as told by your health care provider. This is important. Contact a health care provider if:  You have pain when you urinate.  You have pus or a bad smelling discharge coming from your vagina.  You have redness, swelling, or pain around your incision.  You have fluid or blood coming from your incision.  Your incision feels warm to the touch.  You have pus or a bad smell coming from your incision.  You have a fever.  Your incision starts to break open.  You have abdominal pain that gets worse or does not get better with medicine.  You develop a rash.  You develop nausea and vomiting.  You feel lightheaded. Get help right away if:  You develop pain in your chest or leg.  You develop shortness of breath.  You faint.  You have increased bleeding from your vagina. Summary  After the procedure, it is common to have pain, tiredness, and occasional bleeding from the vagina.  Follow instructions from your health care provider about how to take care of your incision.  Check your incision every day for signs of infection and report any symptoms to your health care provider.  Follow instructions from your health care provider  about activities and restrictions. This information is not intended to replace advice given to you by your health care provider. Make sure you discuss any questions you have with your health care provider. Document Revised: 08/14/2017 Document Reviewed: 12/11/2016 Elsevier Patient Education  2020 [employer].

## 2020-07-03 NOTE — Anesthesia Preprocedure Evaluation (Addendum)
Anesthesia Evaluation  Patient identified by MRN, date of birth, ID band Patient awake    Reviewed: Allergy & Precautions, NPO status , Patient's Chart, lab work & pertinent test results  Airway Mallampati: II  TM Distance: >3 FB Neck ROM: Full    Dental  (+) Dental Advisory Given, Teeth Intact   Pulmonary shortness of breath, asthma , pneumonia, Current Smoker and Patient abstained from smoking.,  H/o PE   Pulmonary exam normal breath sounds clear to auscultation       Cardiovascular negative cardio ROS Normal cardiovascular exam Rhythm:Regular Rate:Normal     Neuro/Psych PSYCHIATRIC DISORDERS negative neurological ROS     GI/Hepatic GERD  Medicated,(+)     substance abuse  alcohol use,   Endo/Other  Obesity   Renal/GU negative Renal ROS     Musculoskeletal negative musculoskeletal ROS (+)   Abdominal (+) + obese,   Peds  Hematology negative hematology ROS (+)   Anesthesia Other Findings   Reproductive/Obstetrics  Left Ovarian Cyst                            Anesthesia Physical  Anesthesia Plan  ASA: III  Anesthesia Plan: General   Post-op Pain Management:    Induction: Intravenous  PONV Risk Score and Plan: 4 or greater and Midazolam, Dexamethasone, Ondansetron and Treatment may vary due to age or medical condition  Airway Management Planned: Oral ETT  Additional Equipment: None  Intra-op Plan:   Post-operative Plan: Extubation in OR  Informed Consent: I have reviewed the patients History and Physical, chart, labs and discussed the procedure including the risks, benefits and alternatives for the proposed anesthesia with the patient or authorized representative who has indicated his/her understanding and acceptance.     Dental advisory given  Plan Discussed with: CRNA  Anesthesia Plan Comments:        Anesthesia Quick Evaluation

## 2020-07-03 NOTE — Interval H&P Note (Signed)
History and Physical Interval Note:  07/03/2020 9:35 AM  Kara Haynes  has presented today for surgery, with the diagnosis of LEFT OVARIAN CYST AND PELVIC ADHESIVE DISEASE.  The various methods of treatment have been discussed with the patient and family. After consideration of risks, benefits and other options for treatment, the patient has consented to  Procedure(s): XI ROBOTIC ASSISTED TOTAL HYSTERECTOMY WITH BILATERAL SALPINGO OOPHORECTOMYPOSSIBLE UNILATERAL SALPINGO OOPHORECTOMY/ LAPAROSCOPIC LYSIS OF ADHESIONS (N/A) as a surgical intervention.  The patient's history has been reviewed, patient examined, no change in status, stable for surgery.  I have reviewed the patient's chart and labs.  Questions were answered to the patient's satisfaction.     Luisa Dago

## 2020-07-03 NOTE — Anesthesia Postprocedure Evaluation (Signed)
Anesthesia Post Note  Patient: Kara Haynes  Procedure(s) Performed: XI ROBOTIC ASSISTED  LEFT  SALPINGO OOPHORECTOMY/ LAPAROSCOPIC LYSIS OF ADHESIONS/LEFT URETEROLYSIS (N/A )     Patient location during evaluation: PACU Anesthesia Type: General Level of consciousness: sedated and patient cooperative Pain management: pain level controlled Vital Signs Assessment: post-procedure vital signs reviewed and stable Respiratory status: spontaneous breathing Cardiovascular status: stable Anesthetic complications: no   No complications documented.  Last Vitals:  Vitals:   07/03/20 1445 07/03/20 1500  BP: 115/78 134/79  Pulse: 67 76  Resp: 15   Temp:  36.9 C  SpO2: 99% 98%    Last Pain:  Vitals:   07/03/20 1500  TempSrc:   PainSc: 0-No pain                 Lewie Loron

## 2020-07-03 NOTE — Op Note (Signed)
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  Surgeon: Adolphus Birchwood, M.D.  Assistant Surgeon: Antionette Char M.D. (an MD assistant was necessary for tissue manipulation, management of robotic instrumentation, retraction and positioning due to the complexity of the case and hospital policies).   Anesthesia: Gen. endotracheal.   Specimens: left tube and ovary, pelvic washings  Estimated Blood Loss: 50 mL. Blood Replacement: None  Complications: none  Indication for Procedure:  Pelvic pain, left complex ovarian cyst, aborted attempt at [facility] by general gynecologist due to dense pelvic adhesive disease. Elevated CA 125.  Operative Findings: 7cm complex blood filled cyst in left ovary/tube. Adherent to the sidewall, rectum, posterior uterus. Rectum densely adherent to posterior uterus/cervix (obliterated posterior cul de sac). Grossly normal right ovary, though with some powderburn signs of endometriosis and right uterosacral ligament tethering. Right ovary adherent to ovarian fossa due to endometriosis. 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   Frozen pathology was consistent with benign endometrioma.   Procedure: The patient's taken to the operating room and placed under general endotracheal anesthesia testing difficulty. She is placed in a dorsolithotomy position and cervical acromial pad was placed. The arms were tucked with care taken to pad the olecranon process. And prepped and draped in usual sterile fashion. A uterine manipulator (zumi) was placed vaginally. A 22mm incision was made in the left upper quadrant palmer's point and a 5 mm Optiview trocar used to enter the abdomen under direct visualization. With  entry into the abdomen and then maintenance of 15 mm of mercury the patient was placed in Trendelenburg position. An incision was made in the umbilicus and a 46mm trochar was placed through this site. Two incisions were made lateral to the umbilical incision in the left and right abdomen measuring 38mm. These incisions were made approximately 10 cm lateral to the umbilical incision. 8 mm robotic trochars were inserted. The robot was docked.  The abdomen was inspected as was the pelvis.  Pelvic washings were obtained.the right ovary was inspected and noted to be grossly normal with a grossly normal fallopian tube.  There was tethering of the right uterosacral ligament and adherence between the right ovary and the ovarian fossa consistent with endometriosis.  There was some palpable lesions of endometriosis on the uterosacral ligament on the right in the right ovary.  This ovary was photographed and left on touched.  The uterus itself was grossly normal in appearance.  The left ovary was densely adherent to the sidewall, the left ureter which was infiltrated with retroperitoneal fibrosis secondary to endometriosis, and was densely adherent to the rectum and sigmoid colon mesentery.  The posterior cul-de-sac was obliterated due to stage IV endometriosis.  For approximately 90 minutes sharp and monopolar adhesiolysis was performed to separate the sigmoid colon and rectum from its dense attachments to the left tube and ovary.  During this time there was unavoidable rupture of the left ovarian cyst for chocolate brown-colored fluid which was aspirated.  The left retroperitoneal sidewall was entered parallel to the ovarian vessels and the left ureter was identified in the retroperitoneal space at the level of the pelvic brim.  Meticulous dissection of the left pararectal space took place in order to mobilize the ureter off of its adhesions to the sidewall.  It was skeletonized and mobilized  off of the dense  retroperitoneal fibrosis that was adhesed to the left tube and ovary that was adherent to the left ovarian fossa.  This facilitated skeletonization mobilization of the ureter off of the surgical field.  The ovary was then resected by first skeletonizing the ovarian vessels above the level of the ureter which were then bipolar fulgurated and transected.  The ovary was elevated and dissection was complete off of the left ureter and sidewall.  The left tube and utero-ovarian ligament were then fulgurated and a bipolar transected after ensuring that we dissected cleanly from the posterior uterus and rectum.  The obliterated posterior cul-de-sac was carefully dissected to mobilize and drop the rectum and sigmoid off of the posterior uterus.  Care was taken during the sharp dissection to prevent entry into the rectum.  The left ovary was placed in an Endo Catch bag.  The rectum was then insufflated with gas while the fourth created a nontraumatic occlusion at the sigmoid colon at the level of the pelvic brim.  This insufflated the rectum while saline was instilled into the pelvis to create a bubble test.  There was no evidence of breach of the rectum as no bubbles was seen.  The fluid was aspirated.  The retroperitoneum was closely inspected and no bleeding or ureteral damage was noted.  The posterior uterus was made hemostatic with bipolar electrosurgery.  There had been a point at the fundus where the manipulator had reached to the serosa of the fundus.  This was made hemostatic with bipolar energy.  Surgi-Flo was applied to the posterior uterus and retroperitoneum and posterior cul-de-sac to aid with hemostasis and prevention of adhesions.  The robot was undocked. The contents of the left Endo Catch bag were first aspirated and then morcellated within the bag to facilitate removal from the abdominal cavity through the left upper quadrant incision.   The ports were all remove. The fascial closure at the  umbilical incision and left upper quadrant port was made with 0 Vicryl.  All incisions were closed with a running subcuticular Monocryl suture. Dermabond was applied. Sponge, lap and needle counts were correct x 3.    The patient had sequential compression devices for VTE prophylaxis.         Disposition: PACU -stable         Condition: stable  Quinn Axe, MD

## 2020-07-03 NOTE — Transfer of Care (Signed)
Immediate Anesthesia Transfer of Care Note  Patient: Kara Haynes  Procedure(s) Performed: XI ROBOTIC ASSISTED  LEFT  SALPINGO OOPHORECTOMY/ LAPAROSCOPIC LYSIS OF ADHESIONS/LEFT URETEROLYSIS (N/A )  Patient Location: PACU  Anesthesia Type:General  Level of Consciousness: awake, alert , oriented, drowsy and patient cooperative  Airway & Oxygen Therapy: Patient Spontanous Breathing and Patient connected to face mask oxygen  Post-op Assessment: Report given to RN and Post -op Vital signs reviewed and stable  Post vital signs: Reviewed and stable  Last Vitals:  Vitals Value Taken Time  BP 133/97 07/03/20 1238  Temp    Pulse 80 07/03/20 1240  Resp 18 07/03/20 1240  SpO2 100 % 07/03/20 1240  Vitals shown include unvalidated device data.  Last Pain:  Vitals:   07/03/20 1004  TempSrc:   PainSc: 0-No pain         Complications: No complications documented.

## 2020-07-03 NOTE — Anesthesia Procedure Notes (Signed)
Procedure Name: Intubation Date/Time: 07/03/2020 10:39 AM Performed by: Raenette Rover, CRNA Pre-anesthesia Checklist: Patient identified, Emergency Drugs available, Suction available and Patient being monitored Patient Re-evaluated:Patient Re-evaluated prior to induction Oxygen Delivery Method: Circle system utilized Preoxygenation: Pre-oxygenation with 100% oxygen Induction Type: IV induction Ventilation: Mask ventilation without difficulty Laryngoscope Size: Mac and 3 Grade View: Grade I Tube type: Oral Tube size: 7.0 mm Number of attempts: 1 Airway Equipment and Method: Stylet Placement Confirmation: ETT inserted through vocal cords under direct vision,  positive ETCO2 and breath sounds checked- equal and bilateral Secured at: 22 cm Tube secured with: Tape Dental Injury: Teeth and Oropharynx as per pre-operative assessment

## 2020-07-04 ENCOUNTER — Encounter (HOSPITAL_COMMUNITY): Payer: Self-pay | Admitting: Gynecologic Oncology

## 2020-07-04 ENCOUNTER — Telehealth: Payer: Self-pay

## 2020-07-04 LAB — CYTOLOGY - NON PAP

## 2020-07-04 LAB — SURGICAL PATHOLOGY

## 2020-07-04 NOTE — Telephone Encounter (Signed)
Kara Haynes states that she is drinking and urinating well. She has some burning with urination.  Told her that that will improve in the next 24 hours. She has not eaten any solid food as she did not know what diet she could follow.  Told her she can eat anything that she wants. Regular diet. She is passing gas. It is painful when she passes gas.Told her that a lot of work was done around the rectum during surgery. Told her to begin the colace 100 mg bid and Miralax 1 capful bid to help with BM. She can adjust dosing after her first BM according to her stool consistency. Afebrile. No chills. Incisions are D&I. Pain controlled with current medications. Kara Haynes is aware of post-op appointments as well as the office number [phone] to call if she has any questions or concerns.

## 2020-07-04 NOTE — Telephone Encounter (Signed)
Kara Haynes is concerned that there was no bubble in the Lovenox syring.  She was afraid to heve her friend administer it.  Reviewed with Kara Cross,NP.   Told Kara Haynes that Kara Haynes said to not worry about the bubble. The person needs to give her the injection as she needs it to help prevent a blood clot.  The other syringes should have the bubble. Kara Haynes will have the person come back to her home about 3:30 pm today to give her the injection. Pt verbalized understanding.

## 2020-07-05 ENCOUNTER — Telehealth: Payer: Self-pay

## 2020-07-05 NOTE — Telephone Encounter (Signed)
TC to patient to convey final pathology showed no cancer and is consistent with endometriosis.  Patient pleased with results.   I asked how her Lovenox injection went yesterday and Kara Haynes stated her friend gave her the injection yesterday and came this am to give today's dose. Encouraged patient to take injection around the same time each day.  Patient stated her friend will be coming by each am to give injection.  Patient will call with any questions or concerns.

## 2020-07-25 ENCOUNTER — Encounter: Payer: Self-pay | Admitting: Gynecologic Oncology

## 2020-07-25 ENCOUNTER — Other Ambulatory Visit: Payer: Self-pay

## 2020-07-25 ENCOUNTER — Inpatient Hospital Stay: Payer: Self-pay | Attending: Gynecologic Oncology | Admitting: Gynecologic Oncology

## 2020-07-25 VITALS — BP 114/66 | HR 94 | Temp 98.0°F | Resp 18 | Wt 184.4 lb

## 2020-07-25 DIAGNOSIS — N80102 Endometriosis of left ovary, unspecified depth: Secondary | ICD-10-CM | POA: Insufficient documentation

## 2020-07-25 DIAGNOSIS — N801 Endometriosis of ovary: Secondary | ICD-10-CM | POA: Insufficient documentation

## 2020-07-25 DIAGNOSIS — Z90722 Acquired absence of ovaries, bilateral: Secondary | ICD-10-CM | POA: Insufficient documentation

## 2020-07-25 DIAGNOSIS — Z90710 Acquired absence of both cervix and uterus: Secondary | ICD-10-CM | POA: Insufficient documentation

## 2020-07-25 NOTE — Progress Notes (Signed)
Postop Follow-up Note: Gyn-Onc  Consult was requested by Dr. Vergie Living for the evaluation of Kara Haynes 29 y.o. female  CC:  Chief Complaint  Patient presents with  . Left ovarian cyst    Assessment/Plan:  Kara Haynes  is a 30 y.o.  year old with a history of stage IV endometriosis and a left ovarian endometrioma status post robotic assisted LSO, lysis of adhesions on 07/03/2020.  She was doing well postoperatively.  We counseled her about her pathology and operative findings.  I am recommending treatment for her endometriosis with medical management with Dr. Vergie Living.  Given that she has a history of VTE, she would be a better candidate for an option such as a progestin releasing IUD compared to oral hormonal suppressants.  She will follow up with Dr. Vergie Living for long-term gynecologic care.  HPI: Ms Chasitty Haynes is a 29 year old P0 who was seen in consultation at the request of Dr Vergie Living for evaluation of a complex left ovarian cyst in the setting of dysmenorrhea, dense pelvic adhesive disease and mildly elevated CA 125.  Kara Haynes history began when she was being evaluated for epigastric pain in July 2021.  This prompted her to undergo CT scan of the abdomen and pelvis in the emergency department (without contrast, stone protocol) which showed a combination of cystic changes in the left ovary measuring 6.3 x 4.2 cm.  She also noted an approximately 84-month history of worsening dysmenorrhea.  She reported that the first 2 days of her menstrual cycle were particularly painful.  Outside of those 2 days she denied pelvic pain.  She reported that the menstrual cycles themselves were of normal duration, interval, and flow.  She was seen by Dr. Vergie Living for this previously identified left ovarian cyst and a transvaginal ultrasound scan was performed on May 02, 2020.  This revealed a uterus measuring 6.4 x 3 x 4 cm that was anteverted.  The  endometrium was 12 mm.  The right ovary measured 4.6 x 2.8 x 2.6 cm.  It was suboptimally visualized due to bowel but there was a no gross evidence of mass.  The left ovary however appeared abnormal and measured 7.7 x 4.8 x 7.4 cm.  It was abnormal in appearance and enlarged containing multiple cystic locules with several intervening septations.  One of the observed septations was thick and irregular.  There was scattered internal echogenicity.  There was no definite internal blood flow on color Doppler.  Ca1 25 was drawn on May 30, 2020 and was mildly elevated at 42.3.  Due to this finding of the left ovarian cystic mass she was taken to the operating room on June 05, 2020 for a planned left salpingo-oophorectomy or left ovarian cystectomy.  At the time of this surgery Dr. Vergie Living identified a stenotic cervix which was difficult to interrogate with placement of a Hulka.  On the diagnostic laparoscopy there was normal omentum liver edge and upper abdomen appreciated.  There was a 3 to 4 cm area on the small bowel with brown mucus-like material and adhesions.  In the pelvis the anterior uterus was normal but the mobility of the uterus was limited due to the large left adnexal complex.  The right ovary could not be visualized.  The left adnexa the fimbria looked normal except for multiple diffuse subcentimeter yellow cysts.  The left fallopian tube was engorged and the tube itself appeared to be 7 to 8 cm in length and 4 to 5  cm in width.  It appeared to contain brown-black material and was densely adhesed to the uterus and left lower pelvis with minimal mobility  Dr. Vergie LivingPickens felt that proceeding with surgical removal of the left adnexa was not safe by him and instead planned to abort the procedure and have the patient seen by GYN oncology.  Pelvic washings were obtained prior to aborting the procedure.  These were cytologically normal.  Interval Hx:  On 07/03/2020 she underwent robotic assisted  left salpingo-oophorectomy, lysis of adhesions, left ureterolysis. Intraoperative findings were significant for a 7 cm complex blood-filled cyst in the left tube and ovary.  It was adherent to the sidewall, rectum, and posterior uterus.  The rectum was densely adherent to the posterior uterus and cervix with obliterated posterior cul-de-sac.  There is a grossly normal right ovary there was some palpable and signs of endometriosis and right uterosacral ligament tethering.  There was left retroperitoneal fibrosis and the left ureter was densely adherent to the sidewall and cyst.. Surgery was challenging due to the stage IV endometriosis but uncomplicated.  Final pathology revealed an endometrioma of the left ovary that was benign.  Since surgery she has done well with no pain and feels improved compared to his symptoms preoperatively.   Current Meds:  Outpatient Encounter Medications as of 07/25/2020  Medication Sig  . albuterol (PROVENTIL HFA) 108 (90 Base) MCG/ACT inhaler Inhale 2 puffs into the lungs every 6 (six) hours as needed for wheezing or shortness of breath.   . pantoprazole (PROTONIX) 40 MG tablet Take 1 tablet (40 mg total) by mouth daily.  Marland Kitchen. ibuprofen (ADVIL) 600 MG tablet Take 1 tablet (600 mg total) by mouth every 6 (six) hours as needed. For AFTER surgery only (Patient not taking: Reported on 06/21/2020)  . oxyCODONE (OXY IR/ROXICODONE) 5 MG immediate release tablet Take 1 tablet (5 mg total) by mouth every 6 (six) hours as needed for severe pain. For AFTER surgery only, do not take and drive (Patient not taking: Reported on 07/25/2020)  . [DISCONTINUED] enoxaparin (LOVENOX) 40 MG/0.4ML injection Inject 0.4 mLs (40 mg total) into the skin daily for 14 doses. For AFTER surgery only  . [DISCONTINUED] erythromycin base (E-MYCIN) 500 MG tablet Take 2 tablets (1000mg  total) at 2 pm, 3 pm, and 10 pm the day before surgery  . [DISCONTINUED] neomycin (MYCIFRADIN) 500 MG tablet Take 2 tablets  (1000mg  total) at 2 pm, 3 pm, and 10 pm the day before surgery   No facility-administered encounter medications on file as of 07/25/2020.    Allergy:  Allergies  Allergen Reactions  . Fish Allergy Hives, Shortness Of Breath and Swelling    Mostly tilapia    Social Hx:   Social History   Socioeconomic History  . Marital status: Single    Spouse name: Not on file  . Number of children: Not on file  . Years of education: Not on file  . Highest education level: Not on file  Occupational History  . Not on file  Tobacco Use  . Smoking status: Current Every Day Smoker    Packs/day: 0.50    Years: 12.00    Pack years: 6.00    Types: Cigarettes  . Smokeless tobacco: Never Used  Vaping Use  . Vaping Use: Never used  Substance and Sexual Activity  . Alcohol use: Yes    Alcohol/week: 0.0 standard drinks    Comment: 05-31-2020  per pt  2-3 shots  vodka per day  . Drug use:  Yes    Types: Marijuana    Comment: Last use 06-26-20  . Sexual activity: Yes    Birth control/protection: None  Other Topics Concern  . Not on file  Social History Narrative  . Not on file   Social Determinants of Health   Financial Resource Strain:   . Difficulty of Paying Living Expenses: Not on file  Food Insecurity: No Food Insecurity  . Worried About Programme researcher, broadcasting/film/video in the Last Year: Never true  . Ran Out of Food in the Last Year: Never true  Transportation Needs: No Transportation Needs  . Lack of Transportation (Medical): No  . Lack of Transportation (Non-Medical): No  Physical Activity:   . Days of Exercise per Week: Not on file  . Minutes of Exercise per Session: Not on file  Stress:   . Feeling of Stress : Not on file  Social Connections:   . Frequency of Communication with Friends and Family: Not on file  . Frequency of Social Gatherings with Friends and Family: Not on file  . Attends Religious Services: Not on file  . Active Member of Clubs or Organizations: Not on file  .  Attends Banker Meetings: Not on file  . Marital Status: Not on file  Intimate Partner Violence:   . Fear of Current or Ex-Partner: Not on file  . Emotionally Abused: Not on file  . Physically Abused: Not on file  . Sexually Abused: Not on file    Past Surgical Hx:  Past Surgical History:  Procedure Laterality Date  . LAPAROSCOPIC OVARIAN CYSTECTOMY Left 06/05/2020   Procedure: LAPAROSCOPIC OVARIAN CYSTECTOMY;  Surgeon: Vineyards Bing, MD;  Location: [facility] [place];  Service: Gynecology;  Laterality: Left;  . NO PAST SURGERIES    . ROBOTIC ASSISTED TOTAL HYSTERECTOMY WITH BILATERAL SALPINGO OOPHERECTOMY N/A 07/03/2020   Procedure: XI ROBOTIC ASSISTED  LEFT  SALPINGO OOPHORECTOMY/ LAPAROSCOPIC LYSIS OF ADHESIONS/LEFT URETEROLYSIS;  Surgeon: Adolphus Birchwood, MD;  Location: [facility];  Service: Gynecology;  Laterality: N/A;    Past Medical Hx:  Past Medical History:  Diagnosis Date  . Alcohol abuse   . Allergic rhinitis, seasonal   . Dyspnea    with asthma  . Eczema   . GERD (gastroesophageal reflux disease)   . 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 History of trichomoniasis   . Left ovarian cyst   . Mild intermittent asthma    followed by pcp   (05-31-2020  per pt last exacerbation 11-09-2019 ED visit)  . Obesity (BMI 30-39.9)   . Pelvic adhesive disease   . Pneumonia     Past Gynecological History:  See HPI No LMP recorded.  Family Hx:  Family History  Problem Relation Age of Onset  . Other Other        denies family h/o clotting d/o  . Asthma Maternal Grandfather   . Hypertension Maternal Grandfather   . Healthy Mother   . Healthy Father     Review of Systems:  Constitutional  Feels well,    ENT Normal appearing ears and nares bilaterally Skin/Breast  No rash, sores, jaundice, itching, dryness Cardiovascular  No chest pain, shortness of breath, or edema   Pulmonary  No cough or wheeze.  Gastro Intestinal  No nausea, vomitting, or diarrhoea. No bright red blood per rectum, no abdominal pain, change in bowel movement, or constipation.  Genito Urinary  No  frequency, urgency, dysuria, no pain Musculo Skeletal  No myalgia, arthralgia, joint swelling or pain  Neurologic  No weakness, numbness, change in gait,  Psychology  No depression, anxiety, insomnia.   Vitals:  Blood pressure 114/66, pulse 94, temperature 98 F (36.7 C), temperature source Tympanic, resp. rate 18, weight 184 lb 6.4 oz (83.6 kg), SpO2 100 %.  Physical Exam: WD in NAD Neck  Supple NROM, without any enlargements.  Lymph Node Survey No cervical supraclavicular or inguinal adenopathy Skin  No rash/lesions/breakdown  Psychiatry  Alert and oriented to person, place, and time  Abdomen  Well healed abdominal incisions.  Back No CVA tenderness Genito Urinary  deferred Rectal  deferred Extremities  No bilateral cyanosis, clubbing or edema.  Luisa Dago, MD  07/25/2020, 3:04 PM

## 2020-07-25 NOTE — Patient Instructions (Signed)
The left ovarian cyst was an endometrioma (which means that you suffer from the condition endometriosis). It can recur (come back). It is best treated with ovulatory and menstrual suppression (eg continuous oral contraceptive pills or progestin releasing IUD). Given that you have a history of blood clots, the IUD would be a better option.  Please contact Dr Vergie Living office to discuss an appointment to talk about management of your endometriosis.

## 2020-08-20 ENCOUNTER — Ambulatory Visit (INDEPENDENT_AMBULATORY_CARE_PROVIDER_SITE_OTHER): Payer: Self-pay | Admitting: Obstetrics and Gynecology

## 2020-08-20 ENCOUNTER — Other Ambulatory Visit: Payer: Self-pay

## 2020-08-20 ENCOUNTER — Encounter: Payer: Self-pay | Admitting: Obstetrics and Gynecology

## 2020-08-20 VITALS — BP 107/75 | HR 93 | Wt 183.6 lb

## 2020-08-20 DIAGNOSIS — N898 Other specified noninflammatory disorders of vagina: Secondary | ICD-10-CM

## 2020-08-20 DIAGNOSIS — Z8619 Personal history of other infectious and parasitic diseases: Secondary | ICD-10-CM

## 2020-08-20 DIAGNOSIS — N80102 Endometriosis of left ovary, unspecified depth: Secondary | ICD-10-CM

## 2020-08-20 DIAGNOSIS — Z86711 Personal history of pulmonary embolism: Secondary | ICD-10-CM

## 2020-08-20 DIAGNOSIS — N801 Endometriosis of ovary: Secondary | ICD-10-CM

## 2020-08-20 MED ORDER — METRONIDAZOLE 500 MG PO TABS: 500.0000 mg | ORAL_TABLET | Freq: Two times a day (BID) | ORAL | 0 refills | Status: AC

## 2020-08-20 MED ORDER — FLUCONAZOLE 150 MG PO TABS: 150.0000 mg | ORAL_TABLET | Freq: Once | ORAL | 0 refills | Status: AC

## 2020-08-20 NOTE — Progress Notes (Signed)
error 

## 2020-08-20 NOTE — Progress Notes (Signed)
Obstetrics and Gynecology Visit Return Patient Evaluation  Appointment Date: 08/22/2020  Primary Care Provider: Evlyn Kanner  OBGYN Clinic: [facility]  Chief Complaint: post op follow up. Stage 4 endometriosis  History of Present Illness:  Kara Haynes is a 29 y.o. P0 with above CC. PMHx significant for stage 4 endo, h/o 2016 PE, h/o trich, same sex relationship, bmi 15, no insurance coverage.  She is s/p 10/19 RA-LSO/LOA and left ureterolysis by Dr. Andrey Farmer after a 9/21 dx l/s by me for complex LO cyst and pelvic pain. Final pathology from October showed endometrioma, no malignancy, negative washings. Operative findings showed: 7cm complex blood filled cyst in left ovary/tube. Adherent to the sidewall, rectum, posterior uterus. Rectum densely adherent to posterior uterus/cervix (obliterated posterior cul de sac). Grossly normal right ovary, though with some powderburn signs of endometriosis and right uterosacral ligament tethering. Right ovary adherent to ovarian fossa due to endometriosis. 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   Interval History: Since that time, she states that she had a period that was less than a week, not heavy bleeding but it was much less painful. Since surgery, she's had some vag d/c, no itching.   Review of Systems:  as noted in the History of Present Illness.  Patient Active Problem List   Diagnosis Date Noted  . Endometriosis of left ovary 07/25/2020  . Retroperitoneal fibrosis   . Dysmenorrhea 06/13/2020  . 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 Left ovarian cyst 04/27/2020  . Obesity (BMI 30-39.9) 04/27/2020  . Alcohol abuse 03/17/2020  . Hepatic steatosis 06/26/2016  . Tobacco abuse 06/26/2016  . History of pulmonary embolism 10/26/2015  . Marijuana use 10/26/2015   Medications:  Rebecca Eaton had no medications administered during  this visit. Current Outpatient Medications  Medication Sig Dispense Refill  . albuterol (PROVENTIL HFA) 108 (90 Base) MCG/ACT inhaler Inhale 2 puffs into the lungs every 6 (six) hours as needed for wheezing or shortness of breath.     Marland Kitchen ibuprofen (ADVIL) 600 MG tablet Take 1 tablet (600 mg total) by mouth every 6 (six) hours as needed. For AFTER surgery only 30 tablet 1  . pantoprazole (PROTONIX) 40 MG tablet Take 1 tablet (40 mg total) by mouth daily. 30 tablet 2  . metroNIDAZOLE (FLAGYL) 500 MG tablet Take 1 tablet (500 mg total) by mouth 2 (two) times daily for 7 days. 14 tablet 0  . oxyCODONE (OXY IR/ROXICODONE) 5 MG immediate release tablet Take 1 tablet (5 mg total) by mouth every 6 (six) hours as needed for severe pain. For AFTER surgery only, do not take and drive (Patient not taking: Reported on 07/25/2020) 15 tablet 0   No current facility-administered medications for this visit.    Allergies: is allergic to fish allergy.  Physical Exam:  BP 107/75   Pulse 93   Wt 183 lb 9.6 oz (83.3 kg)   LMP 07/19/2020 (Approximate)   SpO2 98%   BMI 33.58 kg/m  Body mass index is 33.58 kg/m. General appearance: Well nourished, well developed female in no acute distress.  Neuro/Psych:  Normal mood and affect.    Assessment: pt doing well  Plan:  1. History of trichomoniasis  2. Vaginal discharge Pt declines exam. Pt told to let me know if flagyl bid x 7 and diflucan does not help with the discharge.    [facility]CP referral given for pap smears and d/w her importance of this.  2016 pap negative.   3. History of PE See below  4. Endometriosis I  told her that surgery is only one component and even though her pain is gone and her dysmenorrhea was gone for her cycle after surgery, she'll need to be on medical therapy until menopause with average age is 80-51, with the purpose of medical therapy being to keep any remaining endo from getting worse and hopefully help with any residual endo to  regress. I told her that I highly recommend this because her s/s will likely come back without medical therapy.  Given her history, I told her that progestin therapy is category 2 on [facility] and that she has a small risk of clots with progestin therapy it is highly outweighed by her endo severity.  She is amenable to progestin therapy. Given her lack of insurance coverage, I told her I recommend the mini pill. I told her I recommend applying for medicaid and family planning medicaid to see if she can get coverage for better medications likely depo provera, slynd or potentially a mirena IUD.    RTC: 3 months  Cornelia Copa MD Attending Center for [facility])

## 2020-08-27 MED ORDER — NORETHINDRONE 0.35 MG PO TABS: 1.0000 | ORAL_TABLET | Freq: Every day | ORAL | 4 refills | Status: DC

## 2020-11-02 ENCOUNTER — Encounter (HOSPITAL_COMMUNITY): Payer: Self-pay

## 2020-11-02 ENCOUNTER — Ambulatory Visit (HOSPITAL_COMMUNITY)
Admission: EM | Admit: 2020-11-02 | Discharge: 2020-11-02 | Disposition: A | Discharge status: Home / Self Care | Payer: Self-pay | Attending: Family | Admitting: Family

## 2020-11-02 ENCOUNTER — Other Ambulatory Visit: Payer: Self-pay

## 2020-11-02 DIAGNOSIS — R1013 Epigastric pain: Secondary | ICD-10-CM

## 2020-11-02 DIAGNOSIS — R110 Nausea: Secondary | ICD-10-CM

## 2020-11-02 MED ORDER — PANTOPRAZOLE SODIUM 40 MG PO TBEC: 40.0000 mg | DELAYED_RELEASE_TABLET | Freq: Every day | ORAL | 2 refills | Status: DC

## 2020-11-02 MED ORDER — ALUM & MAG HYDROXIDE-SIMETH 200-200-20 MG/5ML PO SUSP
ORAL | Status: AC
  Filled 2020-11-02: qty 30

## 2020-11-02 MED ORDER — ALUM & MAG HYDROXIDE-SIMETH 200-200-20 MG/5ML PO SUSP
30.0000 mL | Freq: Once | ORAL | Status: AC
  Administered 2020-11-02: 30 mL via ORAL

## 2020-11-02 NOTE — Discharge Instructions (Addendum)
You were given medication today (liquid antacids) to help with your abdominal pain. Recommend also start generic Protonix 40mg  daily to help reduce acid in stomach. Encouraged to eat bland and low acidic foods. Decrease tobacco smoking and limit alcohol intake to help with pain and symptoms. Recommend contact your PCP if abdominal pain continues for further lab work and imaging or go to the ER ASAP if pain worsens.

## 2020-11-02 NOTE — ED Provider Notes (Signed)
[facility]    CSN: [id number] Arrival date & time: 11/02/20  4680      History   Chief Complaint Chief Complaint  Patient presents with  . Abdominal Pain    HPI Kara Haynes is a 30 y.o. female.   30 year old female presents with mid epi-gastric burning pain for the past 3 days. Started with some nausea but has since resolved. Now having mainly pain- worse with eating. Denies any fever, sore throat, vomiting, diarrhea or constipation. Has tried OTC acid pills (thinks it was Famotidine- generic Pepcid) with no relief. Does have a history of similar symptoms and was seen here in Sept 2020. Was placed on Prilosec. Has also been seen by her PCP and has taken Protonix which did help in the past. Denies any chest pain or shortness of breath. Current tobacco smoker. Has been prescribed Micronor BCP's but has not started yet. No current health insurance. Other chronic health issues include asthma and has Albuterol inhaler as needed.   The history is provided by the patient.    Past Medical History:  Diagnosis Date  . Alcohol abuse   . Allergic rhinitis, seasonal   . Dyspnea    with asthma  . Eczema   . GERD (gastroesophageal reflux disease)   . 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 History of trichomoniasis   . Left ovarian cyst   . Mild intermittent asthma    followed by pcp   (05-31-2020  per pt last exacerbation 11-09-2019 ED visit)  . Obesity (BMI 30-39.9)   . Pelvic adhesive disease   . Pneumonia     Patient Active Problem List   Diagnosis Date Noted  . Endometriosis of left ovary 07/25/2020  . Retroperitoneal fibrosis   . Dysmenorrhea 06/13/2020  . 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 Left ovarian cyst 04/27/2020  . Obesity (BMI 30-39.9) 04/27/2020  . Alcohol abuse 03/17/2020  . Hepatic steatosis 06/26/2016  . Tobacco abuse 06/26/2016  . History of pulmonary  embolism 10/26/2015  . Marijuana use 10/26/2015    Past Surgical History:  Procedure Laterality Date  . LAPAROSCOPIC OVARIAN CYSTECTOMY Left 06/05/2020   Procedure: LAPAROSCOPIC OVARIAN CYSTECTOMY;  Surgeon: Bingham Bing, MD;  Location: [facility];  Service: Gynecology;  Laterality: Left;  . NO PAST SURGERIES    . ROBOTIC ASSISTED TOTAL HYSTERECTOMY WITH BILATERAL SALPINGO OOPHERECTOMY N/A 07/03/2020   Procedure: XI ROBOTIC ASSISTED  LEFT  SALPINGO OOPHORECTOMY/ LAPAROSCOPIC LYSIS OF ADHESIONS/LEFT URETEROLYSIS;  Surgeon: Adolphus Birchwood, MD;  Location: [facility];  Service: Gynecology;  Laterality: N/A;    OB History    Gravida  0   Para  0   Term  0   Preterm  0   AB  0   Living  0     SAB  0   IAB  0   Ectopic  0   Multiple  0   Live Births  0            Home Medications    Prior to Admission medications   Medication Sig Start Date End Date Taking? Authorizing Provider  albuterol (VENTOLIN HFA) 108 (90 Base) MCG/ACT inhaler Inhale 2 puffs into the lungs every 6 (six) hours as needed for wheezing or shortness of breath.     [provider]  norethindrone (MICRONOR) 0.35 MG tablet Take 1 tablet (0.35 mg total) by mouth  daily. Take the same time every day. If you are outside the three hour window, use a back up method for birth control 08/27/20   Spurgeon Bing, MD  pantoprazole (PROTONIX) 40 MG tablet Take 1 tablet (40 mg total) by mouth daily. 11/02/20   Sudie Grumbling, NP    Family History Family History  Problem Relation Age of Onset  . Other Other        denies family h/o clotting d/o  . Asthma Maternal Grandfather   . Hypertension Maternal Grandfather   . Healthy Mother   . Healthy Father     Social History Social History   Tobacco Use  . Smoking status: Current Every Day Smoker    Packs/day: 0.50    Years: 12.00    Pack years: 6.00    Types: Cigarettes  . Smokeless tobacco: Never Used  Vaping Use  . Vaping Use:  Never used  Substance Use Topics  . Alcohol use: Yes    Alcohol/week: 0.0 standard drinks    Comment: 05-31-2020  per pt  2-3 shots  vodka per day  . Drug use: Yes    Types: Marijuana    Comment: Last use 06-26-20     Allergies   Fish allergy   Review of Systems Review of Systems  Constitutional: Positive for appetite change and fatigue. Negative for activity change, chills and fever.  HENT: Negative for congestion, mouth sores, sore throat and trouble swallowing.   Respiratory: Negative for cough, chest tightness, shortness of breath and wheezing.   Cardiovascular: Negative for chest pain.  Gastrointestinal: Positive for abdominal pain and nausea (now resolved). Negative for blood in stool, constipation, diarrhea and vomiting.  Genitourinary: Negative for difficulty urinating, dysuria, flank pain and hematuria.  Musculoskeletal: Negative for arthralgias, back pain and myalgias.  Skin: Negative for color change and rash.  Allergic/Immunologic: Positive for environmental allergies and food allergies. Negative for immunocompromised state.  Neurological: Negative for dizziness, tremors, seizures, syncope, weakness, numbness and headaches.  Hematological: Negative for adenopathy. Does not bruise/bleed easily.     Physical Exam Triage Vital Signs ED Triage Vitals  Enc Vitals Group     BP 11/02/20 0850 111/89     Pulse Rate 11/02/20 0850 88     Resp 11/02/20 0850 20     Temp 11/02/20 0850 98.8 F (37.1 C)     Temp Source 11/02/20 0850 Oral     SpO2 11/02/20 0850 100 %     Weight --      Height --      Head Circumference --      Peak Flow --      Pain Score 11/02/20 0848 8     Pain Loc --      Pain Edu? --      Excl. in GC? --    No data found.  Updated Vital Signs BP 111/89 (BP Location: Left Arm)   Pulse 88   Temp 98.8 F (37.1 C) (Oral)   Resp 20   LMP 10/06/2020 (Approximate)   SpO2 100%   Visual Acuity Right Eye Distance:   Left Eye Distance:   Bilateral  Distance:    Right Eye Near:   Left Eye Near:    Bilateral Near:     Physical Exam Vitals and nursing note reviewed.  Constitutional:      General: She is awake. She is not in acute distress.    Appearance: She is well-developed and well-groomed.     Comments:  She is sitting on the exam table in no acute distress.   HENT:     Head: Normocephalic and atraumatic.     Right Ear: Hearing and external ear normal.     Left Ear: Hearing and external ear normal.     Nose: Nose normal. No congestion.     Mouth/Throat:     Lips: Pink.     Mouth: Mucous membranes are moist. No oral lesions.     Pharynx: Oropharynx is clear. Uvula midline. No pharyngeal swelling, oropharyngeal exudate, posterior oropharyngeal erythema or uvula swelling.  Eyes:     Extraocular Movements: Extraocular movements intact.     Conjunctiva/sclera: Conjunctivae normal.  Cardiovascular:     Rate and Rhythm: Normal rate and regular rhythm.     Heart sounds: Normal heart sounds. No murmur heard.   Pulmonary:     Effort: Pulmonary effort is normal. No respiratory distress.     Breath sounds: Normal breath sounds and air entry. No decreased air movement. No decreased breath sounds, wheezing, rhonchi or rales.  Abdominal:     General: Abdomen is flat. Bowel sounds are normal. There is no distension.     Palpations: Abdomen is soft. There is no hepatomegaly or mass.     Tenderness: There is abdominal tenderness in the epigastric area. There is no right CVA tenderness, left CVA tenderness, guarding or rebound.    Musculoskeletal:        General: Normal range of motion.     Cervical back: Normal range of motion.  Skin:    General: Skin is warm and dry.     Capillary Refill: Capillary refill takes less than 2 seconds.  Neurological:     General: No focal deficit present.     Mental Status: She is alert and oriented to person, place, and time.  Psychiatric:        Mood and Affect: Mood normal.        Behavior:  Behavior normal. Behavior is cooperative.        Thought Content: Thought content normal.        Judgment: Judgment normal.      UC Treatments / Results  Labs (all labs ordered are listed, but only abnormal results are displayed) Labs Reviewed - No data to display  EKG   Radiology No results found.  Procedures Procedures (including critical care time)  Medications Ordered in UC Medications  alum & mag hydroxide-simeth (MAALOX/MYLANTA) 200-200-20 MG/5ML suspension 30 mL (30 mLs Oral Given 11/02/20 0934)    Initial Impression / Assessment and Plan / UC Course  I have reviewed the triage vital signs and the nursing notes.  Pertinent labs & imaging results that were available during my care of the patient were reviewed by me and considered in my medical decision making (see chart for details).    Gave GI cocktail (Myanta/Maalox) now to help with symptoms. Discussed that she appears to have a flare up of GERD. Recommend restart Protonix 40mg  daily. Provided information on food choices that can minimize symptoms. Also encouraged to decrease or avoid alcohol use and decrease tobacco use. Note written for work. Follow-up with her PCP if abdominal pain continues for further lab work and imaging or go to the ER ASAP if pain worsens or unable to keep any fluids down.  Final Clinical Impressions(s) / UC Diagnoses   Final diagnoses:  Abdominal pain, epigastric  Nausea without vomiting     Discharge Instructions     You were given  medication today (liquid antacids) to help with your abdominal pain. Recommend also start generic Protonix 40mg  daily to help reduce acid in stomach. Encouraged to eat bland and low acidic foods. Decrease tobacco smoking and limit alcohol intake to help with pain and symptoms. Recommend contact your PCP if abdominal pain continues for further lab work and imaging or go to the ER ASAP if pain worsens.     ED Prescriptions    Medication Sig Dispense Auth.  Provider   pantoprazole (PROTONIX) 40 MG tablet Take 1 tablet (40 mg total) by mouth daily. 30 tablet Wai Minotti, Ali LoweAnn Berry, NP     PDMP not reviewed this encounter.   Sudie GrumblingAmyot, Blair Mesina Berry, NP 11/03/20 [phone]

## 2020-11-02 NOTE — ED Triage Notes (Signed)
Pt c/o midepigastric abdominal pain burning and gnawing in nature for approx 3 days 8/10 pain scale with belching. Also reports increase in urination yesterday.  Last BM today WNL for pt.  Denies radiating pain to back, jaw, flank, fever, n/v/d, burning with urination, urgency, hesitancy, CP, SOB.  Has been taking OTC antireflux medication for approx 1 month, uncertain of name.

## 2020-12-06 ENCOUNTER — Other Ambulatory Visit: Payer: Self-pay

## 2020-12-06 ENCOUNTER — Emergency Department (HOSPITAL_COMMUNITY)
Admission: EM | Admit: 2020-12-06 | Discharge: 2020-12-06 | Disposition: A | Discharge status: Home / Self Care | Payer: Self-pay | Attending: Emergency Medicine | Admitting: Emergency Medicine

## 2020-12-06 ENCOUNTER — Emergency Department (HOSPITAL_COMMUNITY): Payer: Self-pay

## 2020-12-06 DIAGNOSIS — J4520 Mild intermittent asthma, uncomplicated: Secondary | ICD-10-CM | POA: Insufficient documentation

## 2020-12-06 DIAGNOSIS — E860 Dehydration: Secondary | ICD-10-CM | POA: Insufficient documentation

## 2020-12-06 DIAGNOSIS — R42 Dizziness and giddiness: Secondary | ICD-10-CM | POA: Insufficient documentation

## 2020-12-06 DIAGNOSIS — F17210 Nicotine dependence, cigarettes, uncomplicated: Secondary | ICD-10-CM | POA: Insufficient documentation

## 2020-12-06 DIAGNOSIS — R110 Nausea: Secondary | ICD-10-CM | POA: Insufficient documentation

## 2020-12-06 LAB — BASIC METABOLIC PANEL
Anion gap: 10 (ref 5–15)
BUN: 9 mg/dL (ref 6–20)
CO2: 24 mmol/L (ref 22–32)
Calcium: 9.1 mg/dL (ref 8.9–10.3)
Chloride: 106 mmol/L (ref 98–111)
Creatinine, Ser: 0.91 mg/dL (ref 0.44–1.00)
GFR, Estimated: >60 mL/min (ref >60)
Glucose, Bld: 86 mg/dL (ref 70–99)
Potassium: 4.3 mmol/L (ref 3.5–5.1)
Sodium: 140 mmol/L (ref 135–145)

## 2020-12-06 LAB — URINALYSIS, ROUTINE W REFLEX MICROSCOPIC
Bilirubin Urine: NEGATIVE
Glucose, UA: NEGATIVE mg/dL
Ketones, ur: 5 mg/dL — AB
Nitrite: NEGATIVE
Protein, ur: NEGATIVE mg/dL
Specific Gravity, Urine: 1.02 (ref 1.005–1.030)
pH: 6 (ref 5.0–8.0)

## 2020-12-06 LAB — CBC WITH DIFFERENTIAL/PLATELET
Abs Immature Granulocytes: 0 10*3/uL (ref 0.00–0.07)
Basophils Absolute: 0 10*3/uL (ref 0.0–0.1)
Basophils Relative: 1 %
Eosinophils Absolute: 0.1 10*3/uL (ref 0.0–0.5)
Eosinophils Relative: 3 %
HCT: 39.5 % (ref 36.0–46.0)
Hemoglobin: 13.4 g/dL (ref 12.0–15.0)
Immature Granulocytes: 0 %
Lymphocytes Relative: 46 %
Lymphs Abs: 1.5 10*3/uL (ref 0.7–4.0)
MCH: 32.2 pg (ref 26.0–34.0)
MCHC: 33.9 g/dL (ref 30.0–36.0)
MCV: 95 fL (ref 80.0–100.0)
Monocytes Absolute: 0.4 10*3/uL (ref 0.1–1.0)
Monocytes Relative: 12 %
Neutro Abs: 1.2 10*3/uL — ABNORMAL LOW (ref 1.7–7.7)
Neutrophils Relative %: 38 %
Platelets: 286 10*3/uL (ref 150–400)
RBC: 4.16 MIL/uL (ref 3.87–5.11)
RDW: 13.6 % (ref 11.5–15.5)
WBC: 3.3 10*3/uL — ABNORMAL LOW (ref 4.0–10.5)
nRBC: 0 % (ref 0.0–0.2)

## 2020-12-06 LAB — CBG MONITORING, ED: Glucose-Capillary: 85 mg/dL (ref 70–99)

## 2020-12-06 MED ORDER — SODIUM CHLORIDE 0.9 % IV BOLUS
1000.0000 mL | Freq: Once | INTRAVENOUS | Status: AC
  Administered 2020-12-06: 1000 mL via INTRAVENOUS

## 2020-12-06 NOTE — ED Triage Notes (Signed)
Pt BIBA from work-   Per EMS-pt reports sudden onset of dizziness/nausea appx 1100 today. Denies CP, ShOB, headache. Pt arrives AOx4, ambulatory to bathroom, standby assistance.

## 2020-12-06 NOTE — Discharge Instructions (Signed)
Home to rest.  Recommend hydrating fluids such as sugar-free Gatorade.  Return to the emergency room for worsening or concerning symptoms otherwise follow-up with your doctor.

## 2020-12-06 NOTE — ED Notes (Signed)
Pt provided with water and graham crackers, per her request.  EDP Vernona Rieger aware.

## 2020-12-06 NOTE — ED Provider Notes (Signed)
[facility]-EMERGENCY DEPT Provider Note   CSN: [id number] Arrival date & time: 12/06/20  1147     History Chief Complaint  Patient presents with  . Dizziness    Kara Haynes is a 30 y.o. female.  30 year old female with history of GERD presents with complaint of feeling lightheaded and dizzy at work today. 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  Patient continued to feel very hot and started to feel anxious lightheaded and might pass out.  Patient went to the nurse's office where they checked her blood sugar when they asked her to stand up to move to another room she felt like she started to pass out and was helped back into a chair.  Denies loss of consciousness.  States that she feels better resting in the emergency room in a bed however when she got up to ambulate to give a urine sample she felt lightheaded again.  Denies fevers, chills, recent illness.  Patient is compliant with her GERD medications, is a daily smoker.  History of PE, treated with Xarelto in 2015, not currently on Xarelto.  Denies unilateral weakness or dizziness. No other complaints or concerns.        Past Medical History:  Diagnosis Date  . Alcohol abuse   . Allergic rhinitis, seasonal   . Dyspnea    with asthma  . Eczema   . GERD (gastroesophageal reflux disease)   . 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 History of trichomoniasis   . Left ovarian cyst   . Mild intermittent asthma    followed by pcp   (05-31-2020  per pt last exacerbation 11-09-2019 ED visit)  . Obesity (BMI 30-39.9)   . Pelvic adhesive disease   . Pneumonia     Patient Active Problem List   Diagnosis Date Noted  . Endometriosis of left ovary 07/25/2020  . Retroperitoneal fibrosis   . Dysmenorrhea 06/13/2020  . 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  Left ovarian cyst 04/27/2020  . Obesity (BMI 30-39.9) 04/27/2020  . Alcohol abuse 03/17/2020  . Hepatic steatosis 06/26/2016  . Tobacco abuse 06/26/2016  . History of pulmonary embolism 10/26/2015  . Marijuana use 10/26/2015    Past Surgical History:  Procedure Laterality Date  . LAPAROSCOPIC OVARIAN CYSTECTOMY Left 06/05/2020   Procedure: LAPAROSCOPIC OVARIAN CYSTECTOMY;  Surgeon: [place] Bing, MD;  Location: [facility];  Service: Gynecology;  Laterality: Left;  . NO PAST SURGERIES    . ROBOTIC ASSISTED TOTAL HYSTERECTOMY WITH BILATERAL SALPINGO OOPHERECTOMY N/A 07/03/2020   Procedure: XI ROBOTIC ASSISTED  LEFT  SALPINGO OOPHORECTOMY/ LAPAROSCOPIC LYSIS OF ADHESIONS/LEFT URETEROLYSIS;  Surgeon: Adolphus Birchwood, MD;  Location: [facility];  Service: Gynecology;  Laterality: N/A;     OB History    Gravida  0   Para  0   Term  0   Preterm  0   AB  0   Living  0     SAB  0   IAB  0   Ectopic  0   Multiple  0   Live Births  0           Family History  Problem Relation Age of Onset  . Other Other        denies family h/o clotting d/o  . Asthma Maternal  Grandfather   . Hypertension Maternal Grandfather   . Healthy Mother   . Healthy Father     Social History   Tobacco Use  . Smoking status: Current Every Day Smoker    Packs/day: 0.50    Years: 12.00    Pack years: 6.00    Types: Cigarettes  . Smokeless tobacco: Never Used  Vaping Use  . Vaping Use: Never used  Substance Use Topics  . Alcohol use: Yes    Alcohol/week: 0.0 standard drinks    Comment: 05-31-2020  per pt  2-3 shots  vodka per day  . Drug use: Yes    Types: Marijuana    Comment: Last use 06-26-20    Home Medications Prior to Admission medications   Medication Sig Start Date End Date Taking? Authorizing Provider  albuterol (VENTOLIN HFA) 108 (90 Base) MCG/ACT inhaler Inhale 2 puffs into the lungs every 6 (six) hours as needed for wheezing or shortness of breath.      [provider]  norethindrone (MICRONOR) 0.35 MG tablet Take 1 tablet (0.35 mg total) by mouth daily. Take the same time every day. If you are outside the three hour window, use a back up method for birth control 08/27/20   [place] Bing, MD  pantoprazole (PROTONIX) 40 MG tablet Take 1 tablet (40 mg total) by mouth daily. 11/02/20   Sudie Grumbling, NP    Allergies    Fish allergy  Review of Systems   Review of Systems  Constitutional: Negative for chills and fever.  Eyes: Negative for visual disturbance.  Respiratory: Negative for shortness of breath.   Cardiovascular: Negative for chest pain.  Gastrointestinal: Positive for nausea. Negative for abdominal pain, constipation, diarrhea and vomiting.  Musculoskeletal: Negative for arthralgias and myalgias.  Skin: Negative for rash and wound.  Allergic/Immunologic: Negative for immunocompromised state.  Neurological: Positive for dizziness and light-headedness. Negative for speech difficulty and weakness.  Psychiatric/Behavioral: Negative for confusion.  All other systems reviewed and are negative.   Physical Exam Updated Vital Signs BP (!) 146/88   Pulse 85   Temp 98.4 F (36.9 C) (Oral)   Resp 16   SpO2 100%   Physical Exam Vitals and nursing note reviewed.  Constitutional:      General: She is not in acute distress.    Appearance: She is well-developed. She is not diaphoretic.  HENT:     Head: Normocephalic and atraumatic.  Eyes:     Extraocular Movements: Extraocular movements intact.     Pupils: Pupils are equal, round, and reactive to light.  Cardiovascular:     Rate and Rhythm: Normal rate and regular rhythm.     Pulses: Normal pulses.     Heart sounds: Normal heart sounds.  Pulmonary:     Effort: Pulmonary effort is normal.     Breath sounds: Wheezing present.  Abdominal:     Palpations: Abdomen is soft.     Tenderness: There is no abdominal tenderness.  Musculoskeletal:     Cervical back: Neck  supple.     Right lower leg: No edema.     Left lower leg: No edema.  Skin:    General: Skin is warm and dry.     Findings: No erythema or rash.  Neurological:     Mental Status: She is alert and oriented to person, place, and time.     Cranial Nerves: No cranial nerve deficit.     Sensory: No sensory deficit.     Motor:  No weakness.     Gait: Gait normal.  Psychiatric:        Behavior: Behavior normal.     ED Results / Procedures / Treatments   Labs (all labs ordered are listed, but only abnormal results are displayed) Labs Reviewed  CBC WITH DIFFERENTIAL/PLATELET - Abnormal; Notable for the following components:      Result Value   WBC 3.3 (*)    Neutro Abs 1.2 (*)    All other components within normal limits  URINALYSIS, ROUTINE W REFLEX MICROSCOPIC - Abnormal; Notable for the following components:   Hgb urine dipstick MODERATE (*)    Ketones, ur 5 (*)    Leukocytes,Ua TRACE (*)    Bacteria, UA RARE (*)    All other components within normal limits  BASIC METABOLIC PANEL  CBG MONITORING, ED    EKG EKG Interpretation  Date/Time:  Thursday December 06 2020 13:17:10 EDT Ventricular Rate:  78 PR Interval:    QRS Duration: 94 QT Interval:  407 QTC Calculation: 464 R Axis:   57 Text Interpretation: Sinus rhythm Borderline Q waves in lateral leads Since last tracing rate slower Otherwise no significant change Confirmed by Mancel Bale [phone]) on 12/06/2020 2:17:06 PM   Radiology DG Chest Port 1 View  Result Date: 12/06/2020 CLINICAL DATA:  Wheezing.  Dizziness and nausea EXAM: PORTABLE CHEST 1 VIEW COMPARISON:  01/10/2018 FINDINGS: The heart size and mediastinal contours are within normal limits. Both lungs are clear. The visualized skeletal structures are unremarkable. IMPRESSION: No active disease. Electronically Signed   By: Signa Kell M.D.   On: 12/06/2020 13:06    Procedures Procedures   Medications Ordered in ED Medications  sodium chloride 0.9 % bolus  1,000 mL (0 mLs Intravenous Stopped 12/06/20 1412)    ED Course  I have reviewed the triage vital signs and the nursing notes.  Pertinent labs & imaging results that were available during my care of the patient were reviewed by me and considered in my medical decision making (see chart for details).  Clinical Course as of 12/06/20 1418  Thu Dec 06, 2020  7915 30 year old female with history of PE, GERD, asthma with complaint of feeling lightheaded with standing at work today. Exam unremarkable. Labs without significant findings including CBC, BMP, CBG.  Urinalysis contaminated with trace leukocytes no urinary symptoms. Patient symptoms have resolved after IV fluid bolus. Not orthostatic. Chest x-ray shows no active disease. EKG without ischemic changes. Patient states does not feel similar to prior PE.  States that she is feeling better and would like to be discharged to go home and rest and continue to hydrate at home today.  Advised return to ED for any worsening or concerning symptoms otherwise follow-up with PCP. [LM]    Clinical Course User Index [LM] Alden Hipp   MDM Rules/Calculators/A&P                           Final Clinical Impression(s) / ED Diagnoses Final diagnoses:  Dizziness  Dehydration    Rx / DC Orders ED Discharge Orders    None       Jeannie Fend, PA-C 12/06/20 1418    Mancel Bale, MD 12/06/20 [phone]

## 2021-01-29 ENCOUNTER — Emergency Department (HOSPITAL_COMMUNITY)
Admission: EM | Admit: 2021-01-29 | Discharge: 2021-01-29 | Disposition: A | Discharge status: Home / Self Care | Payer: Self-pay | Attending: Emergency Medicine | Admitting: Emergency Medicine

## 2021-01-29 ENCOUNTER — Encounter (HOSPITAL_COMMUNITY): Payer: Self-pay

## 2021-01-29 ENCOUNTER — Other Ambulatory Visit: Payer: Self-pay

## 2021-01-29 DIAGNOSIS — Y990 Civilian activity done for income or pay: Secondary | ICD-10-CM | POA: Insufficient documentation

## 2021-01-29 DIAGNOSIS — X500XXA Overexertion from strenuous movement or load, initial encounter: Secondary | ICD-10-CM | POA: Insufficient documentation

## 2021-01-29 DIAGNOSIS — J4520 Mild intermittent asthma, uncomplicated: Secondary | ICD-10-CM | POA: Insufficient documentation

## 2021-01-29 DIAGNOSIS — F17210 Nicotine dependence, cigarettes, uncomplicated: Secondary | ICD-10-CM | POA: Insufficient documentation

## 2021-01-29 DIAGNOSIS — M79604 Pain in right leg: Secondary | ICD-10-CM | POA: Insufficient documentation

## 2021-01-29 LAB — CBC
HCT: 41.9 % (ref 36.0–46.0)
Hemoglobin: 14.2 g/dL (ref 12.0–15.0)
MCH: 32.5 pg (ref 26.0–34.0)
MCHC: 33.9 g/dL (ref 30.0–36.0)
MCV: 95.9 fL (ref 80.0–100.0)
Platelets: 346 10*3/uL (ref 150–400)
RBC: 4.37 MIL/uL (ref 3.87–5.11)
RDW: 14.1 % (ref 11.5–15.5)
WBC: 5.6 10*3/uL (ref 4.0–10.5)
nRBC: 0 % (ref 0.0–0.2)

## 2021-01-29 LAB — BASIC METABOLIC PANEL
Anion gap: 9 (ref 5–15)
BUN: 8 mg/dL (ref 6–20)
CO2: 26 mmol/L (ref 22–32)
Calcium: 9.2 mg/dL (ref 8.9–10.3)
Chloride: 102 mmol/L (ref 98–111)
Creatinine, Ser: 0.84 mg/dL (ref 0.44–1.00)
GFR, Estimated: >60 mL/min (ref >60)
Glucose, Bld: 92 mg/dL (ref 70–99)
Potassium: 4 mmol/L (ref 3.5–5.1)
Sodium: 137 mmol/L (ref 135–145)

## 2021-01-29 MED ORDER — ENOXAPARIN SODIUM 100 MG/ML IJ SOSY
1.0000 mg/kg | PREFILLED_SYRINGE | Freq: Once | INTRAMUSCULAR | Status: AC
  Administered 2021-01-29: 85 mg via SUBCUTANEOUS
  Filled 2021-01-29: qty 0.85

## 2021-01-29 NOTE — ED Notes (Signed)
Provider at pt bedside  

## 2021-01-29 NOTE — ED Provider Notes (Signed)
Emergency Medicine Provider Triage Evaluation Note  Kara Haynes , a 30 y.o. female  was evaluated in triage.  Pt complains of right leg cramping, swelling.  Patient reports taking a heavy bag of trash out at work 3 days ago.  She felt a tinge in her right calf, did not think anything of it.  She has continued to have cramps in her right calf and some mild swelling which she has noted.  She notes a personal history of PE and is concerned about a DVT.  She denies any chest pain or shortness of breath  Review of Systems  Positive: As above Negative: As above  Physical Exam  BP 132/90 (BP Location: Left Arm)   Pulse 80   Temp 98.9 F (37.2 C) (Oral)   Resp 16   Ht 5\' 2"  (1.575 m)   Wt 86.2 kg   LMP 01/27/2021   SpO2 96%   BMI 34.75 kg/m  Gen:   Awake, no distress   Resp:  Normal effort  MSK:   Moves extremities without difficulty, right lower extremity with tenderness to palpation to the popliteal area.  No significant erythema, no visible edema, no warmth Other:    Medical Decision Making  Medically screening exam initiated at 6:40 PM.  Appropriate orders placed.  Kara Haynes was informed that the remainder of the evaluation will be completed by another provider, this initial triage assessment does not replace that evaluation, and the importance of remaining in the ED until their evaluation is complete.     [name], PA-C 01/29/21 1841    01/31/21, MD 01/29/21 [phone]

## 2021-01-29 NOTE — ED Triage Notes (Addendum)
Patient reports that she was carrying a heavy bag of trash at work 3 days ago. Patient states she had a pain in her right calf area. Patient states the pain continues and "feels like a [place] Horse."  Patient reports a history of PE

## 2021-01-29 NOTE — ED Provider Notes (Signed)
[facility]   CSN: [id number] Arrival date & time: 01/29/21  1712     History Chief Complaint  Patient presents with  . Leg Pain    Kara Haynes is a 30 y.o. female.  The history is provided by the patient. No language interpreter was used.  Leg Pain Associated symptoms: no fever      30 year old female with prior history of PE and was on Xarelto who presents for evaluation of leg pain.  Patient report 3 days ago she felt pain to her right leg after she was carrying out a heavy trash bag at work.  She report initially pain was not severe but now it has increased in intensity described as cramping and also noted some swelling.  No fever chest pain shortness of breath.  Past Medical History:  Diagnosis Date  . Alcohol abuse   . Allergic rhinitis, seasonal   . Dyspnea    with asthma  . Eczema   . GERD (gastroesophageal reflux disease)   . 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 History of trichomoniasis   . Left ovarian cyst   . Mild intermittent asthma    followed by pcp   (05-31-2020  per pt last exacerbation 11-09-2019 ED visit)  . Obesity (BMI 30-39.9)   . Pelvic adhesive disease   . Pneumonia     Patient Active Problem List   Diagnosis Date Noted  . Endometriosis of left ovary 07/25/2020  . Retroperitoneal fibrosis   . Dysmenorrhea 06/13/2020  . 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 Left ovarian cyst 04/27/2020  . Obesity (BMI 30-39.9) 04/27/2020  . Alcohol abuse 03/17/2020  . Hepatic steatosis 06/26/2016  . Tobacco abuse 06/26/2016  . History of pulmonary embolism 10/26/2015  . Marijuana use 10/26/2015    Past Surgical History:  Procedure Laterality Date  . ABDOMINAL HYSTERECTOMY    . LAPAROSCOPIC OVARIAN CYSTECTOMY Left 06/05/2020   Procedure: LAPAROSCOPIC OVARIAN CYSTECTOMY;  Surgeon: [place] Bing, MD;   Location: [facility];  Service: Gynecology;  Laterality: Left;  . NO PAST SURGERIES    . ROBOTIC ASSISTED TOTAL HYSTERECTOMY WITH BILATERAL SALPINGO OOPHERECTOMY N/A 07/03/2020   Procedure: XI ROBOTIC ASSISTED  LEFT  SALPINGO OOPHORECTOMY/ LAPAROSCOPIC LYSIS OF ADHESIONS/LEFT URETEROLYSIS;  Surgeon: Adolphus Birchwood, MD;  Location: [facility];  Service: Gynecology;  Laterality: N/A;     OB History    Gravida  0   Para  0   Term  0   Preterm  0   AB  0   Living  0     SAB  0   IAB  0   Ectopic  0   Multiple  0   Live Births  0           Family History  Problem Relation Age of Onset  . Other Other        denies family h/o clotting d/o  . Asthma Maternal Grandfather   . Hypertension Maternal Grandfather   . Healthy Mother   . Healthy Father     Social History   Tobacco Use  . Smoking status: Current Every Day Smoker    Packs/day: 0.50    Years: 12.00    Pack years: 6.00    Types: Cigarettes  . Smokeless tobacco: Never Used  Vaping Use  . Vaping Use: Never used  Substance Use Topics  .  Alcohol use: Yes    Alcohol/week: 0.0 standard drinks  . Drug use: Yes    Types: Marijuana    Home Medications Prior to Admission medications   Medication Sig Start Date End Date Taking? Authorizing Provider  albuterol (VENTOLIN HFA) 108 (90 Base) MCG/ACT inhaler Inhale 2 puffs into the lungs every 6 (six) hours as needed for wheezing or shortness of breath.     [provider]  enoxaparin (LOVENOX) 40 MG/0.4ML injection INJECT 1 SYRINGE (40 MG TOTAL) INTO THE SKIN DAILY FOR 14 DOSES. FOR AFTER SURGERY ONLY 06/21/20 06/21/21  Warner Mccreedy D, NP  erythromycin base (E-MYCIN) 500 MG tablet TAKE 2 TABLETS (1000MG  TOTAL) AT 2 PM, 3 PM, AND 10 PM THE DAY BEFORE SURGERY 06/21/20 06/21/21  08/21/21 D, NP  ibuprofen (ADVIL) 600 MG tablet TAKE 1 TABLET BY MOUTH EVERY 6 HOURS AS NEEDED. *FOR AFTER SURGERY ONLY* 06/21/20 06/21/21  Cross, 08/21/21 D, NP  neomycin  (MYCIFRADIN) 500 MG tablet TAKE 2 TABLETS (1000MG  TOTAL) AT 2 PM, 3 PM, AND 10 PM THE DAY BEFORE SURGERY 06/21/20 06/21/21  08/21/20 D, NP  norethindrone (MICRONOR) 0.35 MG tablet Take 1 tablet (0.35 mg total) by mouth daily. Take the same time every day. If you are outside the three hour window, use a back up method for birth control 08/27/20   Warner Mccreedy, MD  pantoprazole (PROTONIX) 40 MG tablet Take 1 tablet (40 mg total) by mouth daily. 11/02/20   [place] Bing, NP    Allergies    Fish allergy  Review of Systems   Review of Systems  Constitutional: Negative for fever.  Respiratory: Negative for shortness of breath.   Cardiovascular: Negative for chest pain.  Musculoskeletal: Positive for myalgias.  Skin: Negative for rash and wound.  Neurological: Negative for numbness.    Physical Exam Updated Vital Signs BP 132/90 (BP Location: Left Arm)   Pulse 80   Temp 98.9 F (37.2 C) (Oral)   Resp 16   Ht 5\' 2"  (1.575 m)   Wt 86.2 kg   LMP 01/27/2021   SpO2 96%   BMI 34.75 kg/m   Physical Exam Vitals and nursing note reviewed.  Constitutional:      General: She is not in acute distress.    Appearance: She is well-developed.  HENT:     Head: Atraumatic.  Eyes:     Conjunctiva/sclera: Conjunctivae normal.  Cardiovascular:     Rate and Rhythm: Normal rate and regular rhythm.     Pulses: Normal pulses.     Heart sounds: Normal heart sounds.  Pulmonary:     Effort: Pulmonary effort is normal.     Breath sounds: Normal breath sounds.  Musculoskeletal:        General: Tenderness (Right lower extremity: Mild tenderness to popliteal fossa on palpation.  No significant peripheral edema noted.  Intact dorsalis pedis pulse with brisk cap refill.) present.     Cervical back: Neck supple.  Skin:    Findings: No rash.  Neurological:     Mental Status: She is alert.     ED Results / Procedures / Treatments   Labs (all labs ordered are listed, but only abnormal  results are displayed) Labs Reviewed  CBC  BASIC METABOLIC PANEL    EKG None  Radiology No results found.  Procedures Procedures   Medications Ordered in ED Medications  enoxaparin (LOVENOX) injection 85 mg (has no administration in time range)    ED Course  I  have reviewed the triage vital signs and the nursing notes.  Pertinent labs & imaging results that were available during my care of the patient were reviewed by me and considered in my medical decision making (see chart for details).    MDM Rules/Calculators/A&P                          BP (!) 142/83 (BP Location: Right Arm)   Pulse 70   Temp 98.9 F (37.2 C) (Oral)   Resp 16   Ht 5\' 2"  (1.575 m)   Wt 86.2 kg   LMP 01/27/2021   SpO2 100%   BMI 34.75 kg/m   Final Clinical Impression(s) / ED Diagnoses Final diagnoses:  Right leg pain    Rx / DC Orders ED Discharge Orders         Ordered    VAS 01/29/2021 LOWER EXTREMITY VENOUS (DVT)        01/29/21 2052         7:41 PM Patient with history of prior PE who presents with atraumatic right leg pain most significant to the popliteal fossa.  No signs of infection noted no recent injury to suggest fracture or dislocation.  Will obtain venous Doppler ultrasound to rule out DVT.  8:49 PM Unfortunately ultrasound for venous Doppler study is not currently available.  Patient's labs are reassuring, will give Lovenox shot weightbase for coverage and encourage patient to return tomorrow for Doppler ultrasound study to rule out DVT.   2053, PA-C 01/29/21 2059    01/31/21, MD 01/30/21 [phone]

## 2021-01-29 NOTE — ED Notes (Signed)
Several page attempts made to contact vas tech, request for contact made to [facility] operator- per operator no pages sent after 1900. Request sent to A/C for assistance. RN advised. [employer]

## 2021-01-29 NOTE — Discharge Instructions (Addendum)
Please return tomorrow morning to the front desk check out area and request for venous Doppler ultrasound of your right leg to rule out deep venous thrombosis.

## 2021-01-30 ENCOUNTER — Emergency Department (HOSPITAL_COMMUNITY)
Admission: EM | Admit: 2021-01-30 | Discharge: 2021-01-30 | Disposition: A | Discharge status: Home / Self Care | Payer: Self-pay | Attending: Emergency Medicine | Admitting: Emergency Medicine

## 2021-01-30 ENCOUNTER — Ambulatory Visit (HOSPITAL_COMMUNITY): Admission: RE | Admit: 2021-01-30 | Payer: Self-pay | Source: Ambulatory Visit

## 2021-01-30 ENCOUNTER — Encounter (HOSPITAL_COMMUNITY): Payer: Self-pay

## 2021-01-30 ENCOUNTER — Other Ambulatory Visit: Payer: Self-pay

## 2021-01-30 ENCOUNTER — Emergency Department (HOSPITAL_BASED_OUTPATIENT_CLINIC_OR_DEPARTMENT_OTHER): Payer: Self-pay

## 2021-01-30 DIAGNOSIS — R609 Edema, unspecified: Secondary | ICD-10-CM

## 2021-01-30 DIAGNOSIS — J4520 Mild intermittent asthma, uncomplicated: Secondary | ICD-10-CM | POA: Insufficient documentation

## 2021-01-30 DIAGNOSIS — I8001 Phlebitis and thrombophlebitis of superficial vessels of right lower extremity: Secondary | ICD-10-CM | POA: Insufficient documentation

## 2021-01-30 DIAGNOSIS — F17210 Nicotine dependence, cigarettes, uncomplicated: Secondary | ICD-10-CM | POA: Insufficient documentation

## 2021-01-30 DIAGNOSIS — M7989 Other specified soft tissue disorders: Secondary | ICD-10-CM

## 2021-01-30 NOTE — ED Provider Notes (Signed)
[facility]-EMERGENCY DEPT Provider Note   CSN: [id number] Arrival date & time: 01/30/21  1727     History Chief Complaint  Patient presents with  . Leg Pain    Kara Haynes is a 30 y.o. female.  With a prior history of PE previously on Xarelto who reports 3 days of pain and heaviness in her right lower extremity.  She was seen yesterday with the same complaint and discharged to return this morning for outpatient DVT study however she did not come in so she presented to the emergency department.  She denies any chest pain or shortness of breath  HPI     Past Medical History:  Diagnosis Date  . Alcohol abuse   . Allergic rhinitis, seasonal   . Dyspnea    with asthma  . Eczema   . GERD (gastroesophageal reflux disease)   . 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 History of trichomoniasis   . Left ovarian cyst   . Mild intermittent asthma    followed by pcp   (05-31-2020  per pt last exacerbation 11-09-2019 ED visit)  . Obesity (BMI 30-39.9)   . Pelvic adhesive disease   . Pneumonia     Patient Active Problem List   Diagnosis Date Noted  . Endometriosis of left ovary 07/25/2020  . Retroperitoneal fibrosis   . Dysmenorrhea 06/13/2020  . 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 Left ovarian cyst 04/27/2020  . Obesity (BMI 30-39.9) 04/27/2020  . Alcohol abuse 03/17/2020  . Hepatic steatosis 06/26/2016  . Tobacco abuse 06/26/2016  . History of pulmonary embolism 10/26/2015  . Marijuana use 10/26/2015    Past Surgical History:  Procedure Laterality Date  . ABDOMINAL HYSTERECTOMY    . LAPAROSCOPIC OVARIAN CYSTECTOMY Left 06/05/2020   Procedure: LAPAROSCOPIC OVARIAN CYSTECTOMY;  Surgeon: Yazoo Bing, MD;  Location: [facility];  Service: Gynecology;  Laterality: Left;  . NO PAST SURGERIES    . ROBOTIC ASSISTED TOTAL HYSTERECTOMY WITH BILATERAL  SALPINGO OOPHERECTOMY N/A 07/03/2020   Procedure: XI ROBOTIC ASSISTED  LEFT  SALPINGO OOPHORECTOMY/ LAPAROSCOPIC LYSIS OF ADHESIONS/LEFT URETEROLYSIS;  Surgeon: Adolphus Birchwood, MD;  Location: [facility];  Service: Gynecology;  Laterality: N/A;     OB History    Gravida  0   Para  0   Term  0   Preterm  0   AB  0   Living  0     SAB  0   IAB  0   Ectopic  0   Multiple  0   Live Births  0           Family History  Problem Relation Age of Onset  . Other Other        denies family h/o clotting d/o  . Asthma Maternal Grandfather   . Hypertension Maternal Grandfather   . Healthy Mother   . Healthy Father     Social History   Tobacco Use  . Smoking status: Current Every Day Smoker    Packs/day: 0.50    Years: 12.00    Pack years: 6.00    Types: Cigarettes  . Smokeless tobacco: Never Used  Vaping Use  . Vaping Use: Never used  Substance Use Topics  . Alcohol use: Yes    Alcohol/week: 0.0 standard drinks  . Drug use: Yes    Types: Marijuana    Home Medications   Allergies  Fish allergy  Review of Systems   Review of Systems  Constitutional: Negative for chills and fever.  Respiratory: Negative for shortness of breath.   Cardiovascular: Positive for leg swelling. Negative for chest pain and palpitations.  Musculoskeletal: Positive for myalgias.    Physical Exam Updated Vital Signs BP 125/84   Pulse 72   Temp 98.3 F (36.8 C) (Oral)   Resp 19   Ht 5\' 2"  (1.575 m)   Wt 86.2 kg   LMP 01/27/2021   SpO2 100%   BMI 34.75 kg/m   Physical Exam Vitals and nursing note reviewed.  Constitutional:      General: She is not in acute distress.    Appearance: She is well-developed. She is not diaphoretic.  HENT:     Head: Normocephalic and atraumatic.  Eyes:     General: No scleral icterus.    Conjunctiva/sclera: Conjunctivae normal.  Cardiovascular:     Rate and Rhythm: Normal rate and regular rhythm.     Heart sounds: Normal heart sounds. No  murmur heard. No friction rub. No gallop.   Pulmonary:     Effort: Pulmonary effort is normal. No respiratory distress.     Breath sounds: Normal breath sounds.  Abdominal:     General: Bowel sounds are normal. There is no distension.     Palpations: Abdomen is soft. There is no mass.     Tenderness: There is no abdominal tenderness. There is no guarding.  Musculoskeletal:     Cervical back: Normal range of motion.     Comments: Tenderness and swelling of the right calf  Skin:    General: Skin is warm and dry.  Neurological:     Mental Status: She is alert and oriented to person, place, and time.  Psychiatric:        Behavior: Behavior normal.     ED Results / Procedures / Treatments   Labs (all labs ordered are listed, but only abnormal results are displayed) Labs Reviewed - No data to display  EKG None  Radiology VAS US LOWER EXTREMITY VENOUS (DVT) (ONLY [facility] & [facility] 7a-7p)  Result Date: 01/30/2021  Lower Venous DVT Study Patient Name:  Kara Haynes  Date of Exam:   01/30/2021 Medical Rec #: [id number]                   Accession #:    [id number] Date of Birth: 26-May-1991                   Patient Gender: F Patient Age:   29Y [facility] Procedure:      VAS US LOWER EXTREMITY VENOUS (DVT) Referring Phys: [id number] Quamaine Webb --------------------------------------------------------------------------------  Indications: Edema, and Swelling.  Comparison Study: no prior Performing Technologist: Blanch MediaMegan Riddle RVS  Examination Guidelines: A complete evaluation includes B-mode imaging, spectral Doppler, color Doppler, and power Doppler as needed of all accessible portions of each vessel. Bilateral testing is considered an integral part of a complete examination. Limited examinations for reoccurring indications may be performed as noted. The reflux portion of the exam is performed with the patient in reverse Trendelenburg.   +---------+---------------+---------+-----------+----------+-------------------+ RIGHT    CompressibilityPhasicitySpontaneityPropertiesThrombus Aging      +---------+---------------+---------+-----------+----------+-------------------+ CFV      Full           Yes      Yes                                      +---------+---------------+---------+-----------+----------+-------------------+  SFJ      Full                                                             +---------+---------------+---------+-----------+----------+-------------------+ FV Prox  Full                                                             +---------+---------------+---------+-----------+----------+-------------------+ FV Mid   Full                                                             +---------+---------------+---------+-----------+----------+-------------------+ FV DistalFull                                                             +---------+---------------+---------+-----------+----------+-------------------+ PFV      Full                                                             +---------+---------------+---------+-----------+----------+-------------------+ POP      Full           Yes      Yes                                      +---------+---------------+---------+-----------+----------+-------------------+ PTV      Full                                                             +---------+---------------+---------+-----------+----------+-------------------+ PERO                                                  Not well visualized +---------+---------------+---------+-----------+----------+-------------------+ Gastroc  None                                         Age Indeterminate   +---------+---------------+---------+-----------+----------+-------------------+   +----+---------------+---------+-----------+----------+--------------+  LEFTCompressibilityPhasicitySpontaneityPropertiesThrombus Aging +----+---------------+---------+-----------+----------+--------------+ CFV Full           Yes      Yes                                 +----+---------------+---------+-----------+----------+--------------+  Summary: RIGHT: - Findings consistent with age indeterminate superficial vein thrombosis involving the right great saphenous vein. - There is no evidence of deep vein thrombosis in the lower extremity.  - No cystic structure found in the popliteal fossa.  LEFT: - No evidence of common femoral vein obstruction.  *See table(s) above for measurements and observations.    Preliminary     Procedures Procedures   Medications Ordered in ED Medications - No data to display  ED Course  I have reviewed the triage vital signs and the nursing notes.  Pertinent labs & imaging results that were available during my care of the patient were reviewed by me and considered in my medical decision making (see chart for details).    MDM Rules/Calculators/A&P                         Patient with superficial right gastroc clot consistent with thrombophlebitis. This is not an extensive clot and I feel the patient will be safe with full dose aspirin.  Supportive care indicated.  Discussed outpatient follow-up and return precautions. Final Clinical Impression(s) / ED Diagnoses Final diagnoses:  None  30 year old female here with complaint of right lower extremity pain.  She has a superficial thrombophlebitis of the right calf.  On evaluation he has a ropey cordlike structure in the back of the calf.  Patient will be discharged on oral aspirin.  She appears otherwise appropriate for discharge with return precautions discussed.  Rx / DC Orders ED Discharge Orders    None       Arthor Captain, PA-C 01/30/21 2344    Dione Booze, MD 01/31/21 [phone]

## 2021-01-30 NOTE — Discharge Instructions (Signed)
Begin taking full dose enteric coated aspirin once daily for the next 6 weeks. Follow the instructions for treatment. Wear a compression stocking on the leg.   Get help right away if: You have any of these problems: New or worse pain, swelling, or redness in an arm or leg. Numbness or tingling in an arm or leg. Shortness of breath. Chest pain. Severe pain in your abdomen. Fast breathing. A fast or irregular heartbeat. Blood in your vomit, stool, or urine. A severe headache or confusion. A cut that does not stop bleeding. You feel light-headed or dizzy. You cough up blood. You have a serious fall or accident, or you hit your head.

## 2021-01-30 NOTE — Progress Notes (Signed)
Lower extremity venous has been completed.   Preliminary results in CV Proc.   Blanch Media 01/30/2021 6:50 PM

## 2021-01-30 NOTE — ED Triage Notes (Signed)
Pt was here yesterday and never received ultrasound of right leg. Per d/c instructions, pt told to return to have US done. Pt is ambulatory, c/o right leg pain and tightness. Pt A&Ox4.

## 2021-05-27 ENCOUNTER — Other Ambulatory Visit: Payer: Self-pay

## 2021-05-27 ENCOUNTER — Emergency Department (HOSPITAL_COMMUNITY): Payer: Self-pay

## 2021-05-27 ENCOUNTER — Encounter (HOSPITAL_COMMUNITY): Payer: Self-pay

## 2021-05-27 ENCOUNTER — Emergency Department (HOSPITAL_COMMUNITY)
Admission: EM | Admit: 2021-05-27 | Discharge: 2021-05-27 | Disposition: A | Discharge status: Home / Self Care | Payer: Self-pay | Attending: Emergency Medicine | Admitting: Emergency Medicine

## 2021-05-27 DIAGNOSIS — J4541 Moderate persistent asthma with (acute) exacerbation: Secondary | ICD-10-CM | POA: Insufficient documentation

## 2021-05-27 DIAGNOSIS — F17210 Nicotine dependence, cigarettes, uncomplicated: Secondary | ICD-10-CM | POA: Insufficient documentation

## 2021-05-27 DIAGNOSIS — Z20822 Contact with and (suspected) exposure to covid-19: Secondary | ICD-10-CM | POA: Insufficient documentation

## 2021-05-27 LAB — RESP PANEL BY RT-PCR (FLU A&B, COVID) ARPGX2
Influenza A by PCR: NEGATIVE
Influenza B by PCR: NEGATIVE
SARS Coronavirus 2 by RT PCR: NEGATIVE

## 2021-05-27 MED ORDER — PREDNISONE 20 MG PO TABS: 60.0000 mg | ORAL_TABLET | Freq: Every day | ORAL | 0 refills | Status: DC

## 2021-05-27 MED ORDER — PREDNISONE 20 MG PO TABS
60.0000 mg | ORAL_TABLET | Freq: Once | ORAL | Status: AC
  Administered 2021-05-27: 60 mg via ORAL
  Filled 2021-05-27: qty 3

## 2021-05-27 MED ORDER — ALBUTEROL SULFATE HFA 108 (90 BASE) MCG/ACT IN AERS
8.0000 | INHALATION_SPRAY | Freq: Once | RESPIRATORY_TRACT | Status: AC
  Administered 2021-05-27: 8 via RESPIRATORY_TRACT
  Filled 2021-05-27: qty 6.7

## 2021-05-27 NOTE — ED Provider Notes (Signed)
[facility]-EMERGENC[facility] DEPT Provider Note   CSN: [id number] Arrival date & time: 05/27/21  2707     History Chief Complaint  Patient presents with   Shortness of Breath    Kara Haynes is a 30 y.o. female.  She has a history of asthma.  Smoker.  Complaining of shortness of breath and chest tightness this morning.  Used her inhaler without relief.  Nonproductive cough.  Denies any nausea vomiting diarrhea fevers chills.  The history is provided by the patient.  Shortness of Breath Severity:  Moderate Onset quality:  Gradual Duration:  7 hours Timing:  Constant Progression:  Unchanged Chronicity:  Recurrent Relieved by:  Nothing Worsened by:  Activity and coughing Ineffective treatments:  Inhaler Associated symptoms: chest pain, cough and wheezing   Associated symptoms: no abdominal pain, no fever, no headaches, no neck pain, no rash, no sore throat, no sputum production and no vomiting   Risk factors: tobacco use       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 ;  completed xarelto treated 05/ 2015   History of trichomoniasis    Left ovarian cyst    Mild intermittent asthma    followed by pcp   (05-31-2020  per pt last exacerbation 11-09-2019 ED visit)   Obesity (BMI 30-39.9)    Pelvic adhesive disease    Pneumonia     Patient Active Problem List   Diagnosis Date Noted   Endometriosis of left ovary 07/25/2020   Retroperitoneal fibrosis    Dysmenorrhea 06/13/2020   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   Left ovarian cyst 04/27/2020   Obesity (BMI 30-39.9) 04/27/2020   Alcohol abuse 03/17/2020   Hepatic steatosis 06/26/2016   Tobacco abuse 06/26/2016   History of pulmonary embolism 10/26/2015   Marijuana use 10/26/2015    Past  Surgical History:  Procedure Laterality Date   ABDOMINAL HYSTERECTOMY     LAPAROSCOPIC OVARIAN CYSTECTOMY Left 06/05/2020   Procedure: LAPAROSCOPIC OVARIAN CYSTECTOMY;  Surgeon: Glynn Bing, MD;  Location: [facility];  Service: Gynecology;  Laterality: Left;   NO PAST SURGERIES     ROBOTIC ASSISTED TOTAL HYSTERECTOMY WITH BILATERAL SALPINGO OOPHERECTOMY N/A 07/03/2020   Procedure: XI ROBOTIC ASSISTED  LEFT  SALPINGO OOPHORECTOMY/ LAPAROSCOPIC LYSIS OF ADHESIONS/LEFT URETEROLYSIS;  Surgeon: Adolphus Birchwood, MD;  Location: [facility];  Service: Gynecology;  Laterality: N/A;     OB History     Gravida  0   Para  0   Term  0   Preterm  0   AB  0   Living  0      SAB  0   IAB  0   Ectopic  0   Multiple  0   Live Births  0           Family History  Problem Relation Age of Onset   Other Other        denies family h/o clotting d/o   Asthma Maternal Grandfather    Hypertension Maternal Grandfather    Healthy Mother    Healthy Father     Social History   Tobacco Use   Smoking status: Every Day    Packs/day: 0.50    Years: 12.00  Pack years: 6.00    Types: Cigarettes   Smokeless tobacco: Never  Vaping Use   Vaping Use: Never used  Substance Use Topics   Alcohol use: Yes    Alcohol/week: 0.0 standard drinks   Drug use: Yes    Types: Marijuana    Home Medications Reviewed in epic  Allergies    Fish allergy  Review of Systems   Review of Systems  Constitutional:  Negative for fever.  HENT:  Negative for sore throat.   Eyes:  Negative for visual disturbance.  Respiratory:  Positive for cough, shortness of breath and wheezing. Negative for sputum production.   Cardiovascular:  Positive for chest pain.  Gastrointestinal:  Negative for abdominal pain and vomiting.  Genitourinary:  Negative for dysuria.  Musculoskeletal:  Negative for neck pain.  Skin:  Negative for rash.  Neurological:  Negative for headaches.   Physical  Exam Updated Vital Signs BP (!) 155/102 (BP Location: Left Arm)   Pulse 90   Temp 98.1 F (36.7 C)   Resp 20   Ht 5\' 2"  (1.575 m)   Wt 89.8 kg   SpO2 95%   BMI 36.21 kg/m   Physical Exam Vitals and nursing note reviewed.  Constitutional:      General: She is not in acute distress.    Appearance: She is well-developed.  HENT:     Head: Normocephalic and atraumatic.  Eyes:     Conjunctiva/sclera: Conjunctivae normal.  Cardiovascular:     Rate and Rhythm: Normal rate and regular rhythm.     Heart sounds: No murmur heard. Pulmonary:     Effort: Pulmonary effort is normal. No respiratory distress.     Breath sounds: Wheezing (Few scattered) present.  Abdominal:     Palpations: Abdomen is soft.     Tenderness: There is no abdominal tenderness.  Musculoskeletal:        General: Normal range of motion.     Cervical back: Neck supple.     Right lower leg: No tenderness. No edema.     Left lower leg: No tenderness. No edema.  Skin:    General: Skin is warm and dry.  Neurological:     General: No focal deficit present.     Mental Status: She is alert.    ED Results / Procedures / Treatments   Labs (all labs ordered are listed, but only abnormal results are displayed) Labs Reviewed - No data to display  EKG EKG Interpretation  Date/Time:  Monday May 27 2021 07:08:23 EDT Ventricular Rate:  96 PR Interval:  98 QRS Duration: 88 QT Interval:  367 QTC Calculation: 464 R Axis:   54 Text Interpretation: Sinus rhythm Short PR interval No significant change was found Confirmed by 02-27-1971 [phone]) on 05/27/2021 7:14:32 AM  Radiology DG Chest 2 View  Result Date: 05/27/2021 CLINICAL DATA:  Shortness of breath. EXAM: CHEST - 2 VIEW COMPARISON:  Chest radiograph, 12/06/2020.  CTA PE, 01/10/2018. FINDINGS: Cardiomediastinal silhouette is within normal limits. Lungs are well inflated. No focal consolidation or mass. No pleural effusion or pneumothorax. No acute  osseous abnormality. IMPRESSION: Normal chest. Electronically Signed   By: 01/12/2018 M.D.   On: 05/27/2021 07:40    Procedures Procedures   Medications Ordered in ED Medications  albuterol (VENTOLIN HFA) 108 (90 Base) MCG/ACT inhaler 8 puff (has no administration in time range)  predniSONE (DELTASONE) tablet 60 mg (has no administration in time range)    ED Course  I have  reviewed the triage vital signs and the nursing notes.  Pertinent labs & imaging results that were available during my care of the patient were reviewed by me and considered in my medical decision making (see chart for details).  Clinical Course as of 05/27/21 2125  Mon May 27, 2021  1149 Chest x-ray ordered and interpreted by me as no acute findings.  EKG also fairly unremarkable.  Will check COVID test.  Breathing treatment and prednisone.  Outpatient management. [MB]    Clinical Course User Index [MB] Terrilee Files, MD   MDM Rules/Calculators/A&P                          Kara Haynes was evaluated in Emergency Department on 05/27/2021 for the symptoms described in the history of present illness. She was evaluated in the context of the global COVID-19 pandemic, which necessitated consideration that the patient might be at risk for infection with the SARS-CoV-2 virus that causes COVID-19. Institutional protocols and algorithms that pertain to the evaluation of patients at risk for COVID-19 are in a state of rapid change based on information released by regulatory bodies including the CDC and federal and state organizations. These policies and algorithms were followed during the patient's care in the ED.  30 year old female history of smoking history of asthma here with increased shortness of breath.  Chest x-ray does not show any acute findings.  Moving good air and 95 to 99% on room air.  Will give breathing treatment and steroid course.  Return instructions discussed.  Final Clinical  Impression(s) / ED Diagnoses Final diagnoses:  Moderate persistent asthma with exacerbation  Person under investigation for COVID-19    Rx / DC Orders ED Discharge Orders          Ordered    predniSONE (DELTASONE) 20 MG tablet  Daily        05/27/21 1312             Terrilee Files, MD 05/27/21 2127

## 2021-05-27 NOTE — ED Triage Notes (Signed)
Pt reports waking up short of breath around 0500 today. Hx asthma.

## 2021-05-27 NOTE — Discharge Instructions (Addendum)
You were seen in the emergency department for shortness of breath and chest tightness.  Your chest x-ray did not show any obvious pneumonia and your EKG was unremarkable.  We are putting you on a course of steroids which should help.  Please limit your smoking.  Follow-up with your doctor.  Return to the emergency department if any worsening or concerning symptoms.  You had a COVID swab ordered and you can follow-up this result in MyChart.  If you are positive you will need to isolate for at least 5 days from the beginning of your symptoms.

## 2021-06-09 ENCOUNTER — Emergency Department (HOSPITAL_COMMUNITY): Payer: Self-pay

## 2021-06-09 ENCOUNTER — Other Ambulatory Visit: Payer: Self-pay

## 2021-06-09 ENCOUNTER — Emergency Department (HOSPITAL_COMMUNITY)
Admission: EM | Admit: 2021-06-09 | Discharge: 2021-06-09 | Disposition: A | Discharge status: Home / Self Care | Payer: Self-pay | Attending: Emergency Medicine | Admitting: Emergency Medicine

## 2021-06-09 DIAGNOSIS — J101 Influenza due to other identified influenza virus with other respiratory manifestations: Secondary | ICD-10-CM | POA: Insufficient documentation

## 2021-06-09 DIAGNOSIS — F17210 Nicotine dependence, cigarettes, uncomplicated: Secondary | ICD-10-CM | POA: Insufficient documentation

## 2021-06-09 DIAGNOSIS — J4541 Moderate persistent asthma with (acute) exacerbation: Secondary | ICD-10-CM | POA: Insufficient documentation

## 2021-06-09 DIAGNOSIS — R0602 Shortness of breath: Secondary | ICD-10-CM

## 2021-06-09 DIAGNOSIS — Z20822 Contact with and (suspected) exposure to covid-19: Secondary | ICD-10-CM | POA: Insufficient documentation

## 2021-06-09 LAB — CBC WITH DIFFERENTIAL/PLATELET
Abs Immature Granulocytes: 0.08 10*3/uL — ABNORMAL HIGH (ref 0.00–0.07)
Basophils Absolute: 0 10*3/uL (ref 0.0–0.1)
Basophils Relative: 0 %
Eosinophils Absolute: 0 10*3/uL (ref 0.0–0.5)
Eosinophils Relative: 0 %
HCT: 38.3 % (ref 36.0–46.0)
Hemoglobin: 13.4 g/dL (ref 12.0–15.0)
Immature Granulocytes: 1 %
Lymphocytes Relative: 6 %
Lymphs Abs: 0.5 10*3/uL — ABNORMAL LOW (ref 0.7–4.0)
MCH: 35.1 pg — ABNORMAL HIGH (ref 26.0–34.0)
MCHC: 35 g/dL (ref 30.0–36.0)
MCV: 100.3 fL — ABNORMAL HIGH (ref 80.0–100.0)
Monocytes Absolute: 0.2 10*3/uL (ref 0.1–1.0)
Monocytes Relative: 2 %
Neutro Abs: 7.5 10*3/uL (ref 1.7–7.7)
Neutrophils Relative %: 91 %
Platelets: 286 10*3/uL (ref 150–400)
RBC: 3.82 MIL/uL — ABNORMAL LOW (ref 3.87–5.11)
RDW: 16.2 % — ABNORMAL HIGH (ref 11.5–15.5)
WBC: 8.2 10*3/uL (ref 4.0–10.5)
nRBC: 0 % (ref 0.0–0.2)

## 2021-06-09 LAB — BASIC METABOLIC PANEL
Anion gap: 11 (ref 5–15)
BUN: 6 mg/dL (ref 6–20)
CO2: 22 mmol/L (ref 22–32)
Calcium: 8.2 mg/dL — ABNORMAL LOW (ref 8.9–10.3)
Chloride: 101 mmol/L (ref 98–111)
Creatinine, Ser: 0.9 mg/dL (ref 0.44–1.00)
GFR, Estimated: >60 mL/min (ref >60)
Glucose, Bld: 119 mg/dL — ABNORMAL HIGH (ref 70–99)
Potassium: 4 mmol/L (ref 3.5–5.1)
Sodium: 134 mmol/L — ABNORMAL LOW (ref 135–145)

## 2021-06-09 LAB — RESP PANEL BY RT-PCR (FLU A&B, COVID) ARPGX2
Influenza A by PCR: NEGATIVE
Influenza B by PCR: NEGATIVE
SARS Coronavirus 2 by RT PCR: NEGATIVE

## 2021-06-09 LAB — I-STAT BETA HCG BLOOD, ED (MC, WL, AP ONLY): I-stat hCG, quantitative: <5 m[IU]/mL (ref <5)

## 2021-06-09 MED ORDER — SODIUM CHLORIDE 0.9 % IV BOLUS
1000.0000 mL | Freq: Once | INTRAVENOUS | Status: AC
  Administered 2021-06-09: 1000 mL via INTRAVENOUS

## 2021-06-09 MED ORDER — MAGNESIUM SULFATE IN D5W 1-5 GM/100ML-% IV SOLN
1.0000 g | Freq: Once | INTRAVENOUS | Status: AC
  Administered 2021-06-09: 1 g via INTRAVENOUS
  Filled 2021-06-09: qty 100

## 2021-06-09 MED ORDER — CETIRIZINE HCL 10 MG PO TABS: 10.0000 mg | ORAL_TABLET | Freq: Every day | ORAL | 0 refills | Status: DC

## 2021-06-09 MED ORDER — PREDNISONE 10 MG (21) PO TBPK: ORAL_TABLET | Freq: Every day | ORAL | 0 refills | Status: DC

## 2021-06-09 MED ORDER — FLUTICASONE PROPIONATE HFA 44 MCG/ACT IN AERO: 1.0000 | INHALATION_SPRAY | Freq: Every day | RESPIRATORY_TRACT | 0 refills | Status: DC

## 2021-06-09 NOTE — ED Notes (Signed)
Pt successfully ambulated to the bathroom

## 2021-06-09 NOTE — Discharge Instructions (Signed)
Please follow-up at [facility] internal Medicine Clinic this information has been put in your discharge instructions  I have sent a prescription for a inhaled steroid medication I have talked to our social worker who has mailed over information to [facility] on [address] that should make this medication affordable and potentially free.  Please take Zyrtec once daily  Please return to the ER for any new or concerning symptoms.

## 2021-06-09 NOTE — Care Management (Signed)
ED RN Care Manager  met with patient at bedside to discuss medication assistance.  Patient reports being uninsured and not being able to afford her inhalers, discussed  MATCH Program and needing to be followed by Primary Care patient is agreeable. Discussed [facility] internal Medicine Clinic, patient states, she was seen by them in the past. Patient is amendable with me sending a referral to the [facility] for follow up appointment.  [place] letter was sent along with prescription to [facility] [address].  Updated EDP no further ED Care Management needs identified.

## 2021-06-09 NOTE — Care Management (Signed)
  MATCH Medication Assistance Card Name: Lavonne Kinderman ID (MRN): [id number] Bin: [id number] RX Group: [id number] Discharge Date: 9/25//22 Expiration Date:  06/21/2021                                           (must be filled within 7 days of discharge)       Dear   : Kara Haynes  You have been approved to have the prescriptions written by your discharging physician filled through our [facility] (Medication Assistance Through [facility]) program. This program allows for a one-time (no refills) 34-day supply of selected medications for a low copay amount.  The copay is $3.00 per prescription. For instance, if you have one prescription, you will pay $3.00; for two prescriptions, you pay $6.00; for three prescriptions, you pay $9.00; and so on.  Only certain pharmacies are participating in this program with [facility]. You will need to select one of the pharmacies from the attached list and take your prescriptions, this letter, and your photo ID to one of the participating pharmacies.   We are excited that you are able to use the [facility] program to get your medications. These prescriptions must be filled within 7 days of hospital discharge or they will no longer be valid for the [facility] program. Should you have any problems with your prescriptions please contact your case management team member at [phone] for [place]/ [place].  Thank you, [facility] Health Care Management

## 2021-06-09 NOTE — ED Provider Notes (Signed)
[facility] EMERGENCY DEPARTMENT Provider Note   CSN: [id number] Arrival date & time: 06/09/21  1639     History Chief Complaint  Patient presents with   Shortness of Breath    Kara Haynes is a 30 y.o. female.  HPI Patient is a 30 year old female with past medical history notable for asthma, PE, marijuana use, obesity, alcohol and tobacco use  Patient is presented to the ER today with complaints of 2 days of cough that is productive of yellow sputum she states that this morning her shortness of breath gradually worsened and so she began extremely short of breath.  She called EMS she felt that she was having an asthma exacerbation but did not have a steroid inhaler to use and therefore only used her albuterol inhaler.  She states she was so short of breath that she had trouble using it effectively.  Patient brought in by EMS was given 10 of albuterol // 5 of Atrovent, received Solu-Medrol, epi at 1626.  1 week ago patient took last dose of prednisone from a prior ER visit.  She states that she has not been able to afford her steroid inhaler therefore has not been using 1.  She denies any chest pain at all.  States that she does not feel how she did when she had a pulmonary embolism at which time she said she had very significant chest pain and was not wheezing.  Denies any leg swelling, hemoptysis or cancer history is not on any estrogen medications.     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/25/2020   Retroperitoneal fibrosis    Dysmenorrhea 06/13/2020   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   Left ovarian cyst 04/27/2020   Obesity (BMI 30-39.9) 04/27/2020   Alcohol abuse 03/17/2020   Hepatic steatosis 06/26/2016   Tobacco abuse 06/26/2016   History of pulmonary embolism 10/26/2015   Marijuana use 10/26/2015    Past Surgical History:  Procedure Laterality Date   ABDOMINAL HYSTERECTOMY     LAPAROSCOPIC OVARIAN CYSTECTOMY Left 06/05/2020   Procedure: LAPAROSCOPIC OVARIAN CYSTECTOMY;  Surgeon: Cameron Bing, MD;  Location: [facility];  Service: Gynecology;  Laterality: Left;   NO PAST SURGERIES     ROBOTIC ASSISTED TOTAL HYSTERECTOMY WITH BILATERAL SALPINGO OOPHERECTOMY N/A 07/03/2020   Procedure: XI ROBOTIC ASSISTED  LEFT  SALPINGO OOPHORECTOMY/ LAPAROSCOPIC LYSIS OF ADHESIONS/LEFT URETEROLYSIS;  Surgeon: Adolphus Birchwood, MD;  Location: [facility];  Service: Gynecology;  Laterality: N/A;     OB History     Gravida  0   Para  0   Term  0   Preterm  0   AB  0   Living  0      SAB  0   IAB  0   Ectopic  0  Multiple  0   Live Births  0           Family History  Problem Relation Age of Onset   Other Other        denies family h/o clotting d/o   Asthma Maternal Grandfather    Hypertension Maternal Grandfather    Healthy Mother    Healthy Father     Social History   Tobacco Use   Smoking status: Every Day    Packs/day: 0.50    Years: 12.00    Pack years: 6.00    Types: Cigarettes   Smokeless tobacco: Never  Vaping Use   Vaping Use: Never used  Substance Use Topics   Alcohol use: Yes    Alcohol/week: 0.0 standard drinks   Drug use: Yes    Types: Marijuana    Home Medications Prior to Admission medications   Medication Sig Start Date End Date Taking? Authorizing Provider  cetirizine (ZYRTEC ALLERGY) 10 MG tablet Take 1  tablet (10 mg total) by mouth at bedtime. 06/09/21  Yes Bayla Mcgovern S, PA  fluticasone (FLOVENT HFA) 44 MCG/ACT inhaler Inhale 1 puff into the lungs daily. 06/09/21  Yes Reyna Lorenzi S, PA  predniSONE (STERAPRED UNI-PAK 21 TAB) 10 MG (21) TBPK tablet Take by mouth daily. Take 6 tabs by mouth daily  for 2 days, then 5 tabs for 2 days, then 4 tabs for 2 days, then 3 tabs for 2 days, 2 tabs for 2 days, then 1 tab by mouth daily for 2 days 06/09/21  Yes Demetric Parslow S, PA  albuterol (VENTOLIN HFA) 108 (90 Base) MCG/ACT inhaler Inhale 2 puffs into the lungs every 6 (six) hours as needed for wheezing or shortness of breath.     [provider]  ibuprofen (ADVIL) 600 MG tablet TAKE 1 TABLET BY MOUTH EVERY 6 HOURS AS NEEDED. *FOR AFTER SURGERY ONLY* Patient not taking: No sig reported 06/21/20 06/21/21  Warner Mccreedy D, NP  pantoprazole (PROTONIX) 40 MG tablet Take 1 tablet (40 mg total) by mouth daily. Patient not taking: Reported on 05/27/2021 11/02/20   Sudie Grumbling, NP    Allergies    Fish allergy  Review of Systems   Review of Systems  Constitutional:  Negative for chills and fever.  HENT:  Positive for congestion.   Eyes:  Negative for pain.  Respiratory:  Positive for cough, shortness of breath and wheezing.   Cardiovascular:  Negative for chest pain and leg swelling.  Gastrointestinal:  Negative for abdominal pain and vomiting.  Genitourinary:  Negative for dysuria.  Musculoskeletal:  Negative for myalgias.  Skin:  Negative for rash.  Neurological:  Negative for dizziness and headaches.   Physical Exam Updated Vital Signs BP (!) 140/93   Pulse 93   Temp 98.7 F (37.1 C) (Oral)   Resp (!) 21   Ht 5\' 2"  (1.575 m)   Wt 89.8 kg   SpO2 97% Comment: while ambulating  BMI 36.21 kg/m   Physical Exam Vitals and nursing note reviewed.  Constitutional:      General: She is not in acute distress.    Appearance: She is obese.  HENT:     Head: Normocephalic and  atraumatic.     Nose: Nose normal.  Eyes:     General: No scleral icterus. Cardiovascular:     Rate and Rhythm: Normal rate and regular rhythm.     Pulses: Normal pulses.     Heart sounds: Normal heart sounds.  Pulmonary:     Effort: Pulmonary effort is normal. No respiratory distress.     Breath sounds: Wheezing present.  Abdominal:     Palpations: Abdomen is soft.     Tenderness: There is no abdominal tenderness.  Musculoskeletal:     Cervical back: Normal range of motion.     Right lower leg: No edema.     Left lower leg: No edema.  Skin:    General: Skin is warm and dry.     Capillary Refill: Capillary refill takes less than 2 seconds.  Neurological:     Mental Status: She is alert. Mental status is at baseline.  Psychiatric:        Mood and Affect: Mood normal.        Behavior: Behavior normal.    ED Results / Procedures / Treatments   Labs (all labs ordered are listed, but only abnormal results are displayed) Labs Reviewed  CBC WITH DIFFERENTIAL/PLATELET - Abnormal; Notable for the following components:      Result Value   RBC 3.82 (*)    MCV 100.3 (*)    MCH 35.1 (*)    RDW 16.2 (*)    Lymphs Abs 0.5 (*)    Abs Immature Granulocytes 0.08 (*)    All other components within normal limits  BASIC METABOLIC PANEL - Abnormal; Notable for the following components:   Sodium 134 (*)    Glucose, Bld 119 (*)    Calcium 8.2 (*)    All other components within normal limits  RESP PANEL BY RT-PCR (FLU A&B, COVID) ARPGX2  I-STAT BETA HCG BLOOD, ED ([facility], [facility], [facility] ONLY)    EKG None  Radiology DG Chest Portable 1 View  Result Date: 06/09/2021 CLINICAL DATA:  Shortness of breath EXAM: PORTABLE CHEST 1 VIEW COMPARISON:  05/27/2021 FINDINGS: The heart size and mediastinal contours are within normal limits. Both lungs are clear. The visualized skeletal structures are unremarkable. IMPRESSION: No active disease. Electronically Signed   By: Jasmine Pang M.D.   On: 06/09/2021 17:23     Procedures Procedures   Medications Ordered in ED Medications  magnesium sulfate IVPB 1 g 100 mL (0 g Intravenous Stopped 06/09/21 1827)  sodium chloride 0.9 % bolus 1,000 mL (0 mLs Intravenous Stopped 06/09/21 1933)    ED Course  I have reviewed the triage vital signs and the nursing notes.  Pertinent labs & imaging results that were available during my care of the patient were reviewed by me and considered in my medical decision making (see chart for details).  Clinical Course as of 06/10/21 1219  Wynelle Link Jun 09, 2021  1648 Patient brought in by EMS has been coughing for just over 24 hours productive of yellow sputum given by EMS with 10 of albuterol 5 of Atrovent, received Solu-Medrol, epi at 1626.  1 week ago patient took last dose of prednisone from a prior ER visit. [WF]    Clinical Course User Index [WF] Gailen Shelter, [place]   MDM Rules/Calculators/A&P                          Patient is a 30 year old female presented today to the ER with what she describes as an asthma exacerbation.  She states she is been coughing is somewhat congested states that she has not been taking a steroid inhaler because she could not afford it.  She came to the ER via EMS who had already provided her with  Solu-Medrol, epinephrine, albuterol and Atrovent  She is tachycardic which may be felt multifactorial from her difficulty breathing as well as epinephrine and Solu-Medrol and albuterol administration  I discussed with her my concern about a pulmonary embolism she states she does not think that this is what is happening.  She is not hypoxic is wheezing loudly with prolonged expiration phase she has no lower extremity edema or calf swelling or calf tenderness she has good bilateral pulses and is not complaining any chest pain  Will provide magnesium and reassess  BMP unremarkable CBC unremarkable i-STAT ECG negative for pregnancy COVID influenza negative chest x-ray unremarkable EKG reviewed by Dr.  Rubin Payor.  Sinus tachycardia noted no axis deviations of note.  Patient reassessed multiple times throughout ER visit.  She feels much improved after magnesium and fluids.  Patient is now 4 hours from arriving in the ER.  Epinephrine has worn off and she is no longer tachycardic states that she is breathing fine ambulated with pulse ox that did not desaturate.  She states she was not dyspneic with ambulation.  Would like to be discharged home even after I offered her admission.  We will provide her with prednisone, Zyrtec and Flovent discussed with transitional care team who has arranged for her to receive Flovent inhaler at a reduced price/free at [facility] on [address].  Return precautions given.  Ellis Koffler Wright-Madkins was evaluated in Emergency Department on 06/10/2021 for the symptoms described in the history of present illness. She was evaluated in the context of the global COVID-19 pandemic, which necessitated consideration that the patient might be at risk for infection with the SARS-CoV-2 virus that causes COVID-19. Institutional protocols and algorithms that pertain to the evaluation of patients at risk for COVID-19 are in a state of rapid change based on information released by regulatory bodies including the CDC and federal and state organizations. These policies and algorithms were followed during the patient's care in the ED.   Final Clinical Impression(s) / ED Diagnoses Final diagnoses:  SOB (shortness of breath)  Moderate persistent asthma with exacerbation    Rx / DC Orders ED Discharge Orders          Ordered    fluticasone (FLOVENT HFA) 44 MCG/ACT inhaler  Daily        06/09/21 1945    cetirizine (ZYRTEC ALLERGY) 10 MG tablet  Daily at bedtime        06/09/21 1945    predniSONE (STERAPRED UNI-PAK 21 TAB) 10 MG (21) TBPK tablet  Daily        06/09/21 2100             Gailen Shelter, [place] 06/10/21 1222    Benjiman Core, MD 06/12/21 0011

## 2021-06-09 NOTE — ED Triage Notes (Signed)
Pt bib [facility] for shob that started this AM, pt became increasingly shob today and feeling unable to catch their breath, pt has hx of asthma but has increased productive coughing, pt has been around family member w/a cold

## 2021-06-09 NOTE — ED Notes (Signed)
Rn reviewed discharge instructions with pt. Pt verbalized understanding and had no further questions. VSS upon discharge 

## 2021-11-10 ENCOUNTER — Inpatient Hospital Stay (HOSPITAL_COMMUNITY)
Admission: EM | Admit: 2021-11-10 | Discharge: 2021-11-19 | DRG: 870 | Disposition: A | Discharge status: Home Health | Payer: Self-pay | Attending: Internal Medicine | Admitting: Internal Medicine

## 2021-11-10 ENCOUNTER — Emergency Department (HOSPITAL_COMMUNITY): Payer: Self-pay

## 2021-11-10 ENCOUNTER — Other Ambulatory Visit: Payer: Self-pay

## 2021-11-10 ENCOUNTER — Emergency Department (HOSPITAL_COMMUNITY)
Admission: EM | Admit: 2021-11-10 | Discharge: 2021-11-10 | Disposition: A | Discharge status: Home / Self Care | Payer: Self-pay | Attending: Emergency Medicine | Admitting: Emergency Medicine

## 2021-11-10 ENCOUNTER — Encounter (HOSPITAL_COMMUNITY): Payer: Self-pay | Admitting: Emergency Medicine

## 2021-11-10 DIAGNOSIS — B349 Viral infection, unspecified: Secondary | ICD-10-CM | POA: Diagnosis present

## 2021-11-10 DIAGNOSIS — Z79899 Other long term (current) drug therapy: Secondary | ICD-10-CM

## 2021-11-10 DIAGNOSIS — R531 Weakness: Secondary | ICD-10-CM

## 2021-11-10 DIAGNOSIS — Z01818 Encounter for other preprocedural examination: Secondary | ICD-10-CM

## 2021-11-10 DIAGNOSIS — F1010 Alcohol abuse, uncomplicated: Secondary | ICD-10-CM

## 2021-11-10 DIAGNOSIS — Z6832 Body mass index (BMI) 32.0-32.9, adult: Secondary | ICD-10-CM

## 2021-11-10 DIAGNOSIS — J4541 Moderate persistent asthma with (acute) exacerbation: Secondary | ICD-10-CM

## 2021-11-10 DIAGNOSIS — J9601 Acute respiratory failure with hypoxia: Secondary | ICD-10-CM

## 2021-11-10 DIAGNOSIS — Z86711 Personal history of pulmonary embolism: Secondary | ICD-10-CM

## 2021-11-10 DIAGNOSIS — A419 Sepsis, unspecified organism: Principal | ICD-10-CM | POA: Diagnosis present

## 2021-11-10 DIAGNOSIS — R739 Hyperglycemia, unspecified: Secondary | ICD-10-CM | POA: Diagnosis present

## 2021-11-10 DIAGNOSIS — R0902 Hypoxemia: Secondary | ICD-10-CM

## 2021-11-10 DIAGNOSIS — Z91013 Allergy to seafood: Secondary | ICD-10-CM

## 2021-11-10 DIAGNOSIS — F10930 Alcohol use, unspecified with withdrawal, uncomplicated: Secondary | ICD-10-CM

## 2021-11-10 DIAGNOSIS — Z20822 Contact with and (suspected) exposure to covid-19: Secondary | ICD-10-CM | POA: Diagnosis present

## 2021-11-10 DIAGNOSIS — J9600 Acute respiratory failure, unspecified whether with hypoxia or hypercapnia: Secondary | ICD-10-CM

## 2021-11-10 DIAGNOSIS — K219 Gastro-esophageal reflux disease without esophagitis: Secondary | ICD-10-CM | POA: Diagnosis present

## 2021-11-10 DIAGNOSIS — J45909 Unspecified asthma, uncomplicated: Secondary | ICD-10-CM | POA: Diagnosis present

## 2021-11-10 DIAGNOSIS — E871 Hypo-osmolality and hyponatremia: Secondary | ICD-10-CM | POA: Diagnosis present

## 2021-11-10 DIAGNOSIS — E8721 Acute metabolic acidosis: Secondary | ICD-10-CM | POA: Diagnosis present

## 2021-11-10 DIAGNOSIS — E876 Hypokalemia: Secondary | ICD-10-CM | POA: Diagnosis present

## 2021-11-10 DIAGNOSIS — Y900 Blood alcohol level of less than 20 mg/100 ml: Secondary | ICD-10-CM | POA: Diagnosis present

## 2021-11-10 DIAGNOSIS — J690 Pneumonitis due to inhalation of food and vomit: Secondary | ICD-10-CM | POA: Diagnosis present

## 2021-11-10 DIAGNOSIS — R7401 Elevation of levels of liver transaminase levels: Secondary | ICD-10-CM

## 2021-11-10 DIAGNOSIS — E669 Obesity, unspecified: Secondary | ICD-10-CM | POA: Diagnosis present

## 2021-11-10 DIAGNOSIS — J45901 Unspecified asthma with (acute) exacerbation: Secondary | ICD-10-CM | POA: Diagnosis present

## 2021-11-10 DIAGNOSIS — T380X5A Adverse effect of glucocorticoids and synthetic analogues, initial encounter: Secondary | ICD-10-CM | POA: Diagnosis present

## 2021-11-10 DIAGNOSIS — J9602 Acute respiratory failure with hypercapnia: Secondary | ICD-10-CM | POA: Diagnosis present

## 2021-11-10 DIAGNOSIS — I10 Essential (primary) hypertension: Secondary | ICD-10-CM | POA: Diagnosis present

## 2021-11-10 DIAGNOSIS — Z4659 Encounter for fitting and adjustment of other gastrointestinal appliance and device: Secondary | ICD-10-CM

## 2021-11-10 DIAGNOSIS — Z825 Family history of asthma and other chronic lower respiratory diseases: Secondary | ICD-10-CM

## 2021-11-10 DIAGNOSIS — Z8249 Family history of ischemic heart disease and other diseases of the circulatory system: Secondary | ICD-10-CM

## 2021-11-10 DIAGNOSIS — E8729 Other acidosis: Secondary | ICD-10-CM | POA: Diagnosis present

## 2021-11-10 DIAGNOSIS — F10230 Alcohol dependence with withdrawal, uncomplicated: Secondary | ICD-10-CM | POA: Diagnosis present

## 2021-11-10 DIAGNOSIS — J95859 Other complication of respirator [ventilator]: Secondary | ICD-10-CM

## 2021-11-10 DIAGNOSIS — F17210 Nicotine dependence, cigarettes, uncomplicated: Secondary | ICD-10-CM | POA: Diagnosis present

## 2021-11-10 DIAGNOSIS — F10229 Alcohol dependence with intoxication, unspecified: Secondary | ICD-10-CM | POA: Diagnosis present

## 2021-11-10 DIAGNOSIS — K760 Fatty (change of) liver, not elsewhere classified: Secondary | ICD-10-CM | POA: Diagnosis present

## 2021-11-10 DIAGNOSIS — J189 Pneumonia, unspecified organism: Secondary | ICD-10-CM

## 2021-11-10 DIAGNOSIS — J4522 Mild intermittent asthma with status asthmaticus: Secondary | ICD-10-CM | POA: Diagnosis present

## 2021-11-10 DIAGNOSIS — E872 Acidosis, unspecified: Secondary | ICD-10-CM | POA: Diagnosis present

## 2021-11-10 LAB — COMPREHENSIVE METABOLIC PANEL
ALT: 28 U/L (ref 0–44)
AST: 59 U/L — ABNORMAL HIGH (ref 15–41)
Albumin: 3.9 g/dL (ref 3.5–5.0)
Alkaline Phosphatase: 65 U/L (ref 38–126)
Anion gap: 13 (ref 5–15)
BUN: <5 mg/dL — ABNORMAL LOW (ref 6–20)
CO2: 20 mmol/L — ABNORMAL LOW (ref 22–32)
Calcium: 9.2 mg/dL (ref 8.9–10.3)
Chloride: 101 mmol/L (ref 98–111)
Creatinine, Ser: 0.89 mg/dL (ref 0.44–1.00)
GFR, Estimated: >60 mL/min (ref >60)
Glucose, Bld: 178 mg/dL — ABNORMAL HIGH (ref 70–99)
Potassium: 3.8 mmol/L (ref 3.5–5.1)
Sodium: 134 mmol/L — ABNORMAL LOW (ref 135–145)
Total Bilirubin: 0.7 mg/dL (ref 0.3–1.2)
Total Protein: 7.2 g/dL (ref 6.5–8.1)

## 2021-11-10 LAB — PROTIME-INR
INR: 0.9 (ref 0.8–1.2)
Prothrombin Time: 12.2 seconds (ref 11.4–15.2)

## 2021-11-10 LAB — CBC WITH DIFFERENTIAL/PLATELET
Abs Immature Granulocytes: 0.03 10*3/uL (ref 0.00–0.07)
Basophils Absolute: 0 10*3/uL (ref 0.0–0.1)
Basophils Relative: 0 %
Eosinophils Absolute: 0 10*3/uL (ref 0.0–0.5)
Eosinophils Relative: 0 %
HCT: 40.8 % (ref 36.0–46.0)
Hemoglobin: 13.9 g/dL (ref 12.0–15.0)
Immature Granulocytes: 1 %
Lymphocytes Relative: 5 %
Lymphs Abs: 0.3 10*3/uL — ABNORMAL LOW (ref 0.7–4.0)
MCH: 33.7 pg (ref 26.0–34.0)
MCHC: 34.1 g/dL (ref 30.0–36.0)
MCV: 99 fL (ref 80.0–100.0)
Monocytes Absolute: 0.1 10*3/uL (ref 0.1–1.0)
Monocytes Relative: 1 %
Neutro Abs: 5.6 10*3/uL (ref 1.7–7.7)
Neutrophils Relative %: 93 %
Platelets: 273 10*3/uL (ref 150–400)
RBC: 4.12 MIL/uL (ref 3.87–5.11)
RDW: 14.4 % (ref 11.5–15.5)
WBC: 6 10*3/uL (ref 4.0–10.5)
nRBC: 0 % (ref 0.0–0.2)

## 2021-11-10 LAB — BRAIN NATRIURETIC PEPTIDE: B Natriuretic Peptide: 22.7 pg/mL (ref 0.0–100.0)

## 2021-11-10 LAB — ETHANOL: Alcohol, Ethyl (B): <10 mg/dL (ref <10)

## 2021-11-10 LAB — URINALYSIS, ROUTINE W REFLEX MICROSCOPIC
Bilirubin Urine: NEGATIVE
Glucose, UA: 50 mg/dL — AB
Ketones, ur: NEGATIVE mg/dL
Leukocytes,Ua: NEGATIVE
Nitrite: NEGATIVE
Protein, ur: NEGATIVE mg/dL
Specific Gravity, Urine: >1.046 — ABNORMAL HIGH (ref 1.005–1.030)
pH: 5 (ref 5.0–8.0)

## 2021-11-10 LAB — LACTIC ACID, PLASMA
Lactic Acid, Venous: 3.4 mmol/L (ref 0.5–1.9)
Lactic Acid, Venous: 3 mmol/L (ref 0.5–1.9)
Lactic Acid, Venous: 1.8 mmol/L (ref 0.5–1.9)

## 2021-11-10 LAB — PHOSPHORUS: Phosphorus: 2.6 mg/dL (ref 2.5–4.6)

## 2021-11-10 LAB — I-STAT BETA HCG BLOOD, ED (MC, WL, AP ONLY): I-stat hCG, quantitative: <5 m[IU]/mL (ref <5)

## 2021-11-10 LAB — RESP PANEL BY RT-PCR (FLU A&B, COVID) ARPGX2
Influenza A by PCR: NEGATIVE
Influenza B by PCR: NEGATIVE
SARS Coronavirus 2 by RT PCR: NEGATIVE

## 2021-11-10 LAB — MAGNESIUM: Magnesium: 1.3 mg/dL — ABNORMAL LOW (ref 1.7–2.4)

## 2021-11-10 LAB — TROPONIN I (HIGH SENSITIVITY): Troponin I (High Sensitivity): 3 ng/L (ref <18)

## 2021-11-10 LAB — D-DIMER, QUANTITATIVE: D-Dimer, Quant: 0.66 ug/mL-FEU — ABNORMAL HIGH (ref 0.00–0.50)

## 2021-11-10 MED ORDER — THIAMINE HCL 100 MG/ML IJ SOLN
100.0000 mg | Freq: Every day | INTRAMUSCULAR | Status: DC
  Administered 2021-11-11 – 2021-11-12 (×2): 100 mg via INTRAVENOUS
  Filled 2021-11-10 (×3): qty 2

## 2021-11-10 MED ORDER — SODIUM CHLORIDE 0.9 % IV SOLN
500.0000 mg | Freq: Once | INTRAVENOUS | Status: AC
  Administered 2021-11-10: 500 mg via INTRAVENOUS
  Filled 2021-11-10: qty 5

## 2021-11-10 MED ORDER — LORAZEPAM 2 MG/ML IJ SOLN
2.0000 mg | Freq: Once | INTRAMUSCULAR | Status: AC
  Administered 2021-11-10: 2 mg via INTRAVENOUS
  Filled 2021-11-10: qty 1

## 2021-11-10 MED ORDER — ENOXAPARIN SODIUM 40 MG/0.4ML IJ SOSY
40.0000 mg | PREFILLED_SYRINGE | INTRAMUSCULAR | Status: DC
  Administered 2021-11-10 – 2021-11-12 (×3): 40 mg via SUBCUTANEOUS
  Filled 2021-11-10 (×3): qty 0.4

## 2021-11-10 MED ORDER — FOLIC ACID 1 MG PO TABS
1.0000 mg | ORAL_TABLET | Freq: Every day | ORAL | Status: DC
  Administered 2021-11-10 – 2021-11-13 (×4): 1 mg via ORAL
  Filled 2021-11-10 (×4): qty 1

## 2021-11-10 MED ORDER — ACETAMINOPHEN 650 MG RE SUPP: 650.0000 mg | Freq: Four times a day (QID) | RECTAL | Status: DC | PRN

## 2021-11-10 MED ORDER — SODIUM CHLORIDE 0.9% FLUSH
3.0000 mL | Freq: Two times a day (BID) | INTRAVENOUS | Status: DC
  Administered 2021-11-10 – 2021-11-19 (×16): 3 mL via INTRAVENOUS

## 2021-11-10 MED ORDER — ALBUTEROL SULFATE (2.5 MG/3ML) 0.083% IN NEBU
10.0000 mg/h | INHALATION_SOLUTION | RESPIRATORY_TRACT | Status: AC
  Administered 2021-11-10: 10 mg/h via RESPIRATORY_TRACT
  Filled 2021-11-10: qty 0.5
  Filled 2021-11-10: qty 12

## 2021-11-10 MED ORDER — SODIUM CHLORIDE 0.9 % IV BOLUS
1000.0000 mL | Freq: Once | INTRAVENOUS | Status: AC
  Administered 2021-11-10: 1000 mL via INTRAVENOUS

## 2021-11-10 MED ORDER — ALBUTEROL SULFATE HFA 108 (90 BASE) MCG/ACT IN AERS
6.0000 | INHALATION_SPRAY | Freq: Once | RESPIRATORY_TRACT | Status: AC
  Administered 2021-11-10: 6 via RESPIRATORY_TRACT
  Filled 2021-11-10: qty 6.7

## 2021-11-10 MED ORDER — IPRATROPIUM BROMIDE HFA 17 MCG/ACT IN AERS
2.0000 | INHALATION_SPRAY | Freq: Once | RESPIRATORY_TRACT | Status: AC
  Administered 2021-11-10: 2 via RESPIRATORY_TRACT
  Filled 2021-11-10: qty 12.9

## 2021-11-10 MED ORDER — IOHEXOL 350 MG/ML SOLN
75.0000 mL | Freq: Once | INTRAVENOUS | Status: AC | PRN
  Administered 2021-11-10: 75 mL via INTRAVENOUS

## 2021-11-10 MED ORDER — IPRATROPIUM-ALBUTEROL 0.5-2.5 (3) MG/3ML IN SOLN
3.0000 mL | Freq: Four times a day (QID) | RESPIRATORY_TRACT | Status: DC
  Administered 2021-11-10 – 2021-11-11 (×2): 3 mL via RESPIRATORY_TRACT
  Filled 2021-11-10 (×3): qty 3

## 2021-11-10 MED ORDER — ALBUTEROL SULFATE HFA 108 (90 BASE) MCG/ACT IN AERS
2.0000 | INHALATION_SPRAY | RESPIRATORY_TRACT | Status: DC | PRN
Start: 2021-11-10 — End: 2021-11-10

## 2021-11-10 MED ORDER — ADULT MULTIVITAMIN W/MINERALS CH
1.0000 | ORAL_TABLET | Freq: Every day | ORAL | Status: DC
  Administered 2021-11-10 – 2021-11-13 (×4): 1 via ORAL
  Filled 2021-11-10 (×4): qty 1

## 2021-11-10 MED ORDER — ALBUTEROL SULFATE (2.5 MG/3ML) 0.083% IN NEBU: 2.5000 mg | INHALATION_SOLUTION | RESPIRATORY_TRACT | Status: DC | PRN

## 2021-11-10 MED ORDER — LACTATED RINGERS IV SOLN: INTRAVENOUS | Status: DC

## 2021-11-10 MED ORDER — PREDNISONE 20 MG PO TABS
40.0000 mg | ORAL_TABLET | Freq: Every day | ORAL | Status: DC
  Administered 2021-11-11: 40 mg via ORAL
  Filled 2021-11-10: qty 2

## 2021-11-10 MED ORDER — ACETAMINOPHEN 325 MG PO TABS
650.0000 mg | ORAL_TABLET | Freq: Once | ORAL | Status: AC | PRN
  Administered 2021-11-10: 650 mg via ORAL
  Filled 2021-11-10: qty 2

## 2021-11-10 MED ORDER — THIAMINE HCL 100 MG PO TABS
100.0000 mg | ORAL_TABLET | Freq: Every day | ORAL | Status: DC
  Administered 2021-11-10 – 2021-11-13 (×2): 100 mg via ORAL
  Filled 2021-11-10 (×4): qty 1

## 2021-11-10 MED ORDER — BUDESONIDE 0.25 MG/2ML IN SUSP
0.2500 mg | Freq: Two times a day (BID) | RESPIRATORY_TRACT | Status: DC
  Administered 2021-11-11 – 2021-11-18 (×14): 0.25 mg via RESPIRATORY_TRACT
  Filled 2021-11-10 (×16): qty 2

## 2021-11-10 MED ORDER — ALBUTEROL SULFATE HFA 108 (90 BASE) MCG/ACT IN AERS
2.0000 | INHALATION_SPRAY | Freq: Four times a day (QID) | RESPIRATORY_TRACT | Status: DC | PRN
Start: 2021-11-10 — End: 2021-11-10

## 2021-11-10 MED ORDER — POLYETHYLENE GLYCOL 3350 17 G PO PACK: 17.0000 g | PACK | Freq: Every day | ORAL | Status: DC | PRN

## 2021-11-10 MED ORDER — ACETAMINOPHEN 325 MG PO TABS: 650.0000 mg | ORAL_TABLET | Freq: Four times a day (QID) | ORAL | Status: DC | PRN

## 2021-11-10 MED ORDER — PREDNISONE 20 MG PO TABS
60.0000 mg | ORAL_TABLET | Freq: Once | ORAL | Status: AC
  Administered 2021-11-10: 60 mg via ORAL
  Filled 2021-11-10: qty 3

## 2021-11-10 MED ORDER — METHYLPREDNISOLONE SODIUM SUCC 125 MG IJ SOLR
125.0000 mg | Freq: Once | INTRAMUSCULAR | Status: AC
  Administered 2021-11-10: 125 mg via INTRAVENOUS
  Filled 2021-11-10: qty 2

## 2021-11-10 MED ORDER — PREDNISONE 20 MG PO TABS: 60.0000 mg | ORAL_TABLET | Freq: Every day | ORAL | 0 refills | Status: DC

## 2021-11-10 MED ORDER — MAGNESIUM SULFATE 2 GM/50ML IV SOLN
2.0000 g | Freq: Once | INTRAVENOUS | Status: AC
  Administered 2021-11-10: 2 g via INTRAVENOUS
  Filled 2021-11-10: qty 50

## 2021-11-10 MED ORDER — PIPERACILLIN-TAZOBACTAM 3.375 G IVPB 30 MIN
3.3750 g | Freq: Once | INTRAVENOUS | Status: AC
  Administered 2021-11-10: 3.375 g via INTRAVENOUS
  Filled 2021-11-10: qty 50

## 2021-11-10 NOTE — ED Notes (Signed)
Unfortunately, UA was not obtained when pt went to the restroom. Will attempt to obtain when pt voids again.

## 2021-11-10 NOTE — H&P (Signed)
History and Physical   Kara Haynes EVO:[id number] DOB: 1991/02/01 DOA: 11/10/2021  PCP: Evlyn Kanner, MD (currently unassigned as patient is has not been seen in clinic in over a year)  Patient coming from: Home  Chief Complaint: Shortness of breath  HPI: Kara Haynes is a 31 y.o. female with medical history significant of asthma, GERD, alcohol use, hepatic steatosis, pulmonary embolism, pelvic adhesive disease, retroperitoneal fibrosis, endometriosis who presents with worsening shortness of breath.  Presents with patient presents with ongoing/worsening shortness of breath.  Patient was seen this morning for the same and noted to have wheezing without hypoxia or increased work of breathing at that time.  She did look comfortable at that time and was given prednisone and inhalers.  And discharged home.  Due to suspected asthma exacerbation from viral process.  She states that she went home and took a nap and then when she woke up she was significantly more short of breath and had some increasing chest tightness.  Patient is reportedly a heavy drinker and her last drink was last night.  Typically drinks around 12 shots of liquor a day on average, sometimes more sometimes less.  She denies fevers, chills, abdominal pain, constipation, diarrhea, nausea, vomiting.  ED Course: Vital signs in the ED significant for heart rate in the 100s to 130s, respiratory rate in the 20s, blood pressure in the 120s to 150s systolic.  Not currently requiring any oxygen.  Lab work-up showed CMP with sodium 134, bicarb 20, glucose 178, AST mildly elevated 59.  CBC within normal limits.  PT and INR within normal limits.  Lactic acid initially elevated to 3.4 and improved to 3 on repeat.  Troponin negative.  BNP normal.  D-dimer mildly elevated at 0.66.  Respiratory panel for flu and COVID-negative.  Urinalysis, ethanol level and blood cultures are pending.  Patient had 2 chest x-rays  performed today both without acute abnormality.  CTA PE study showed no PE proximal to the segmental level however due to bolus timing unable to get fully eval distal to this.  Groundglass opacities also noted likely infectious versus inflammatory with a reactive suprahilar lymph node.  Patient received Zosyn and azithromycin in the ED as well as Solu-Medrol, albuterol, Ativan and 2 L of fluids.  Patient reportedly improved some with steroids and nebulizers however had continued increased work of breathing and had worsened last time she was discharged home.  Review of Systems: As per HPI otherwise all other systems reviewed and are negative.  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 ;  completed xarelto treated 05/ 2015   History of trichomoniasis    Left ovarian cyst    Mild intermittent asthma    followed by pcp   (05-31-2020  per pt last exacerbation 11-09-2019 ED visit)   Obesity (BMI 30-39.9)    Pelvic adhesive disease    Pneumonia     Past Surgical History:  Procedure Laterality Date   ABDOMINAL HYSTERECTOMY     LAPAROSCOPIC OVARIAN CYSTECTOMY Left 06/05/2020   Procedure: LAPAROSCOPIC OVARIAN CYSTECTOMY;  Surgeon: [place] Bing, MD;  Location: [facility] [place];  Service: Gynecology;  Laterality: Left;   NO PAST SURGERIES     ROBOTIC ASSISTED TOTAL HYSTERECTOMY WITH BILATERAL SALPINGO  OOPHERECTOMY N/A 07/03/2020   Procedure: XI ROBOTIC ASSISTED  LEFT  SALPINGO OOPHORECTOMY/ LAPAROSCOPIC LYSIS OF ADHESIONS/LEFT URETEROLYSIS;  Surgeon: Adolphus Birchwood, MD;  Location: [facility];  Service: Gynecology;  Laterality: N/A;    Social History  reports that she has been smoking cigarettes. She has a 6.00 pack-year smoking history. She has never used smokeless tobacco. She reports current  alcohol use. She reports current drug use. Drug: Marijuana.  Allergies  Allergen Reactions   Fish Allergy Hives, Shortness Of Breath and Swelling    Mostly tilapia    Family History  Problem Relation Age of Onset   Other Other        denies family h/o clotting d/o   Asthma Maternal Grandfather    Hypertension Maternal Grandfather    Healthy Mother    Healthy Father   Reviewed on admission  Prior to Admission medications   Medication Sig Start Date End Date Taking? Authorizing Provider  albuterol (VENTOLIN HFA) 108 (90 Base) MCG/ACT inhaler Inhale 2 puffs into the lungs every 6 (six) hours as needed for wheezing or shortness of breath.     [provider]  cetirizine (ZYRTEC ALLERGY) 10 MG tablet Take 1 tablet (10 mg total) by mouth at bedtime. 06/09/21   Gailen Shelter, PA  fluticasone (FLOVENT HFA) 44 MCG/ACT inhaler Inhale 1 puff into the lungs daily. 06/09/21   Gailen Shelter, PA  pantoprazole (PROTONIX) 40 MG tablet Take 1 tablet (40 mg total) by mouth daily. Patient not taking: Reported on 05/27/2021 11/02/20   Sudie Grumbling, NP  predniSONE (DELTASONE) 20 MG tablet Take 3 tablets (60 mg total) by mouth daily for 5 days. 11/10/21 11/15/21  Virgina Norfolk, DO    Physical Exam: Vitals:   11/10/21 1730 11/10/21 1821 11/10/21 1830 11/10/21 1906  BP: 139/84 (!) 143/85 (!) 127/96 (!) 141/89  Pulse: (!) 124 (!) 128 (!) 126 (!) 131  Resp: 20 (!) 29 (!) 30 (!) 26  Temp:      TempSrc:      SpO2: 100% 96% 97% 97%  Weight:      Height:        Physical Exam  Labs on Admission: I have personally reviewed following labs and imaging studies  CBC: Recent Labs  Lab 11/10/21 1517  WBC 6.0  NEUTROABS 5.6  HGB 13.9  HCT 40.8  MCV 99.0  PLT 273    Basic Metabolic Panel: Recent Labs  Lab 11/10/21 1517  NA 134*  K 3.8  CL 101  CO2 20*  GLUCOSE 178*  BUN <5*  CREATININE 0.89  CALCIUM 9.2    GFR: Estimated Creatinine Clearance: 90.7 mL/min (by C-G formula  based on SCr of 0.89 mg/dL).  Liver Function Tests: Recent Labs  Lab 11/10/21 1517  AST 59*  ALT 28  ALKPHOS 65  BILITOT 0.7  PROT 7.2  ALBUMIN 3.9    Urine analysis:    Component Value Date/Time   COLORURINE YELLOW 11/10/2021 2025   APPEARANCEUR CLEAR 11/10/2021 2025   LABSPEC >1.046 (H) 11/10/2021 2025   PHURINE 5.0 11/10/2021 2025   GLUCOSEU 50 (A) 11/10/2021 2025   HGBUR SMALL (A) 11/10/2021 2025   BILIRUBINUR NEGATIVE 11/10/2021 2025   KETONESUR NEGATIVE 11/10/2021 2025   PROTEINUR NEGATIVE 11/10/2021 2025   UROBILINOGEN 1.0 05/27/2019 1337   NITRITE NEGATIVE 11/10/2021 2025   LEUKOCYTESUR NEGATIVE 11/10/2021 2025    Radiological Exams on Admission: CT Angio Chest PE W and/or Wo Contrast  Result Date: 11/10/2021 CLINICAL  DATA:  Pulmonary embolism (PE) suspected, unknown D-dimer EXAM: CT ANGIOGRAPHY CHEST WITH CONTRAST TECHNIQUE: Multidetector CT imaging of the chest was performed using the standard protocol during bolus administration of intravenous contrast. Multiplanar CT image reconstructions and MIPs were obtained to evaluate the vascular anatomy. RADIATION DOSE REDUCTION: This exam was performed according to the departmental dose-optimization program which includes automated exposure control, adjustment of the mA and/or kV according to patient size and/or use of iterative reconstruction technique. CONTRAST:  75mL OMNIPAQUE IOHEXOL 350 MG/ML SOLN COMPARISON:  April 2019 FINDINGS: Cardiovascular: Satisfactory opacification of the pulmonary arteries to the proximal segmental level. No evidence of pulmonary embolism. Normal heart size. No pericardial effusion. Mediastinum/Nodes: 1.5 cm right suprahilar node. No enlarged mediastinal nodes. Included thyroid is unremarkable. Esophagus is unremarkable. Lungs/Pleura: Patchy ground-glass density bilaterally primarily within the anterior right upper lobe. No pleural effusion or pneumothorax. Upper Abdomen: No acute abnormality.  Possible too small to characterize low-density lesion of the right hepatic lobe. Musculoskeletal: No acute or significant osseous abnormality. Review of the MIP images confirms the above findings. IMPRESSION: No evidence of acute pulmonary embolism to the proximal segmental level. More distal evaluation is limited by contrast bolus timing. Few patchy ground-glass opacity bilaterally primarily within the anterior right upper lobe. Likely represents infectious/inflammatory process. A mildly enlarged right suprahilar node is probably reactive. Electronically Signed   By: Guadlupe SpanishPraneil  Patel M.D.   On: 11/10/2021 18:37   DG Chest Portable 1 View  Result Date: 11/10/2021 CLINICAL DATA:  Shortness of breath EXAM: PORTABLE CHEST 1 VIEW COMPARISON:  Previous studies including the examination done earlier today FINDINGS: The heart size and mediastinal contours are within normal limits. Both lungs are clear. The visualized skeletal structures are unremarkable. IMPRESSION: No active disease. Electronically Signed   By: Ernie AvenaPalani  Rathinasamy M.D.   On: 11/10/2021 15:57   DG Chest Portable 1 View  Result Date: 11/10/2021 CLINICAL DATA:  31 year old female with history of cough. Respiratory distress. EXAM: PORTABLE CHEST 1 VIEW COMPARISON:  Chest x-ray 06/09/2021. FINDINGS: Lung volumes are normal. No consolidative airspace disease. No pleural effusions. No pneumothorax. No pulmonary nodule or mass noted. Pulmonary vasculature and the cardiomediastinal silhouette are within normal limits. IMPRESSION: No radiographic evidence of acute cardiopulmonary disease. Electronically Signed   By: Trudie Reedaniel  Entrikin M.D.   On: 11/10/2021 08:49    EKG: Independently reviewed.  Sinus tachycardia at 125 bpm.  Baseline wander.  RSR prime in V2.  QTc prolonged at 567.  Assessment/Plan Principal Problem:   Asthma exacerbation Active Problems:   PNA (pneumonia)   Hepatic steatosis   Alcohol abuse   Transaminitis   Asthma  exacerbation Pneumonia Rule out sepsis > Patient presenting for second time in the same day with worsening shortness of breath and wheezing.  Concern for asthma exacerbation that has worsened despite initial treatments earlier today. > CT performed in the ED to rule out PE was negative for PE though the distal evaluation was limited.  Did show groundglass opacities favoring infectious versus inflammatory.  Suspicious for pneumonia however due to lack of leukocytosis or significant fever suspect possible viral pneumonia. > Negative for flu or COVID in the ED.  We will check full viral panel to evaluate for etiology of possible viral pneumonia.  We will also add procalcitonin to further rule out bacterial etiology. > Also ruling out any degree of sepsis.  Is tachycardic and somewhat tachypneic in the ED however this is consistent with receiving nebulizations in the setting of an asthma  exacerbation.  Does have elevated lactic acid however this could be secondary to her increased work of breathing and muscle use.  Lactic acid is downtrending thus far in the ED as well. > Has received Zosyn and azithromycin in the ED.  We will hold off on further antibiotics at this time. > Has also received steroids, nebulizers in the ED. - Monitor on telemetry - Continue with daily steroids - Scheduled DuoNebs - As needed albuterol nebulizer - Continue home Flovent - Magnesium IV now - Check respiratory viral panel - Procalcitonin - Continue to trend lactic acid - Continue IV fluids overnight  Alcohol use Hepatic steatosis Transaminitis > Patient is reportedly a heavy drinker with last drink last night. > There is some suspicion in the ED initially that some of her tachycardia could be related to alcohol withdrawals however this appears to be too soon and she otherwise does not appear to be having withdrawals. > Denies history of withdrawal.  But has not stopped drinking for more than a day at a time in a long  time. > History of hepatic steatosis noted.  Mild transaminitis with AST elevated to 59. - Follow-up ethanol level - CIWA without Ativan for now - Folate, thiamine, multivitamin - Add-on Mg and Phos  DVT prophylaxis: Lovenox Code Status:   Full Family Communication:  Girlfriend updated at bedside Disposition Plan:   Patient is from:  Home  Anticipated DC to:  Home  Anticipated DC date:  1 to 3 days  Anticipated DC barriers: None  Consults called:  None Admission status:  Observation, telemetry  Severity of Illness: The appropriate patient status for this patient is OBSERVATION. Observation status is judged to be reasonable and necessary in order to provide the required intensity of service to ensure the patient's safety. The patient's presenting symptoms, physical exam findings, and initial radiographic and laboratory data in the context of their medical condition is felt to place them at decreased risk for further clinical deterioration. Furthermore, it is anticipated that the patient will be medically stable for discharge from the hospital within 2 midnights of admission.    Synetta Fail MD [facility]  How to contact the [facility] Attending or Consulting provider 7A - 7P or covering provider during after hours 7P -7A, for this patient?   Check the care team in [facility] and look for a) attending/consulting [facility] provider listed and b) the [facility] team listed Log into [email or web address] and use [place]'s universal password to access. If you do not have the password, please contact the hospital operator. Locate the [facility] provider you are looking for under [facility] and page to a number that you can be directly reached. If you still have difficulty reaching the provider, please page the [facility] (Director on Call) for the Hospitalists listed on amion for assistance.  11/10/2021, 9:19 PM

## 2021-11-10 NOTE — ED Triage Notes (Signed)
Pt states she was seen at [facility] this morning for resp distress and sent home with inhaler.  Woke up having severe chest pain and resp distress.  Speaking in short phrases.

## 2021-11-10 NOTE — ED Notes (Signed)
Pt to CT

## 2021-11-10 NOTE — ED Triage Notes (Signed)
Pt BIBA from home. Asthma/resp distress x2-3 days. Home rescue inhaler did not improve symptoms. EMS noted gen wheezing sounds.   Given 125mg  IV Solu-Medrol DuoNeb  Hx asthma  BP: 138/92 HR: 119 SPO2: 96: on 11L simple mask w/Duoneb treatment RR: 25

## 2021-11-10 NOTE — Discharge Instructions (Signed)
Take next dose of prednisone tomorrow.  Recommend 4 puffs of albuterol inhaler every 4 hours for the next 1 to 2 days and then as needed.  Recommend Atrovent pump with 2 puffs every 6 hours for the next 1 to 2 days and then as needed.  Please return if symptoms worsen.

## 2021-11-10 NOTE — ED Provider Notes (Signed)
Tachycardia I spoke with the patient and her [facility] EMERGENCY DEPARTMENT Provider Note   CSN: [id number] Arrival date & time: 11/10/21  1507     History  Chief Complaint  Patient presents with   Chest Pain   Shortness of Breath    Kara Haynes is a 31 y.o. female.  Patient presents ER chief complaint of shortness of breath.  She states that she was seen for shortness of breath earlier today and was a long hospital work-up and sent home with inhaler.  She took a nap at home and woke up and stated that her shortness of breath was worse and she had increased chest tightness.  She was found to be febrile here today but denies any fevers.  Denies vomiting or diarrhea.      Home Medications Prior to Admission medications   Medication Sig Start Date End Date Taking? Authorizing Provider  albuterol (VENTOLIN HFA) 108 (90 Base) MCG/ACT inhaler Inhale 2 puffs into the lungs every 6 (six) hours as needed for wheezing or shortness of breath.     [provider]  cetirizine (ZYRTEC ALLERGY) 10 MG tablet Take 1 tablet (10 mg total) by mouth at bedtime. 06/09/21   Gailen Shelter, PA  fluticasone (FLOVENT HFA) 44 MCG/ACT inhaler Inhale 1 puff into the lungs daily. 06/09/21   Gailen Shelter, PA  pantoprazole (PROTONIX) 40 MG tablet Take 1 tablet (40 mg total) by mouth daily. Patient not taking: Reported on 05/27/2021 11/02/20   Sudie Grumbling, NP  predniSONE (DELTASONE) 20 MG tablet Take 3 tablets (60 mg total) by mouth daily for 5 days. 11/10/21 11/15/21  Virgina Norfolk, DO      Allergies    Fish allergy    Review of Systems   Review of Systems  HENT:  Negative for ear pain.   Eyes:  Negative for pain.  Respiratory:  Positive for chest tightness, shortness of breath and wheezing. Negative for cough.   Cardiovascular:  Negative for chest pain.  Gastrointestinal:  Negative for abdominal pain.  Genitourinary:  Negative for flank pain.   Musculoskeletal:  Negative for back pain.  Skin:  Negative for rash.  Neurological:  Negative for headaches.   Physical Exam Updated Vital Signs BP (!) 141/89    Pulse (!) 131    Temp 99.2 F (37.3 C) (Oral)    Resp (!) 26    Ht 5\' 2"  (1.575 m)    Wt 81.6 kg    LMP 11/07/2021    SpO2 97%    BMI 32.92 kg/m  Physical Exam Constitutional:      Appearance: Normal appearance. She is ill-appearing.  HENT:     Head: Normocephalic.     Nose: Nose normal.  Eyes:     Extraocular Movements: Extraocular movements intact.  Cardiovascular:     Rate and Rhythm: Tachycardia present.  Pulmonary:     Effort: Respiratory distress present.     Breath sounds: Wheezing present.  Musculoskeletal:        General: Normal range of motion.     Cervical back: Normal range of motion.  Neurological:     General: No focal deficit present.     Mental Status: She is alert. Mental status is at baseline.    ED Results / Procedures / Treatments   Labs (all labs ordered are listed, but only abnormal results are displayed) Labs Reviewed  COMPREHENSIVE METABOLIC PANEL - Abnormal; Notable for the following components:  Result Value   Sodium 134 (*)    CO2 20 (*)    Glucose, Bld 178 (*)    BUN <5 (*)    AST 59 (*)    All other components within normal limits  LACTIC ACID, PLASMA - Abnormal; Notable for the following components:   Lactic Acid, Venous 3.4 (*)    All other components within normal limits  LACTIC ACID, PLASMA - Abnormal; Notable for the following components:   Lactic Acid, Venous 3.0 (*)    All other components within normal limits  CBC WITH DIFFERENTIAL/PLATELET - Abnormal; Notable for the following components:   Lymphs Abs 0.3 (*)    All other components within normal limits  D-DIMER, QUANTITATIVE (NOT AT [facility]) - Abnormal; Notable for the following components:   D-Dimer, Quant 0.66 (*)    All other components within normal limits  RESP PANEL BY RT-PCR (FLU A&B, COVID) [id number]   CULTURE, BLOOD (ROUTINE X 2)  CULTURE, BLOOD (ROUTINE X 2)  BRAIN NATRIURETIC PEPTIDE  PROTIME-INR  URINALYSIS, ROUTINE W REFLEX MICROSCOPIC  ETHANOL  I-STAT BETA HCG BLOOD, ED ([facility], [facility], [facility] ONLY)  TROPONIN I (HIGH SENSITIVITY)    EKG EKG Interpretation  Date/Time:  Sunday November 10 2021 15:18:04 EST Ventricular Rate:  125 PR Interval:  70 QRS Duration: 87 QT Interval:  393 QTC Calculation: 567 R Axis:   58 Text Interpretation: Sinus tachycardia Ventricular premature complex Aberrant conduction of SV complex(es) RSR' in V1 or V2, probably normal variant Borderline repolarization abnormality Prolonged QT interval Confirmed by Norman Clay ([id number]) on 11/10/2021 3:21:24 PM  Radiology CT Angio Chest PE W and/or Wo Contrast  Result Date: 11/10/2021 CLINICAL DATA:  Pulmonary embolism (PE) suspected, unknown D-dimer EXAM: CT ANGIOGRAPHY CHEST WITH CONTRAST TECHNIQUE: Multidetector CT imaging of the chest was performed using the standard protocol during bolus administration of intravenous contrast. Multiplanar CT image reconstructions and MIPs were obtained to evaluate the vascular anatomy. RADIATION DOSE REDUCTION: This exam was performed according to the departmental dose-optimization program which includes automated exposure control, adjustment of the mA and/or kV according to patient size and/or use of iterative reconstruction technique. CONTRAST:  15mL OMNIPAQUE IOHEXOL 350 MG/ML SOLN COMPARISON:  April 2019 FINDINGS: Cardiovascular: Satisfactory opacification of the pulmonary arteries to the proximal segmental level. No evidence of pulmonary embolism. Normal heart size. No pericardial effusion. Mediastinum/Nodes: 1.5 cm right suprahilar node. No enlarged mediastinal nodes. Included thyroid is unremarkable. Esophagus is unremarkable. Lungs/Pleura: Patchy ground-glass density bilaterally primarily within the anterior right upper lobe. No pleural effusion or pneumothorax. Upper Abdomen: No acute  abnormality. Possible too small to characterize low-density lesion of the right hepatic lobe. Musculoskeletal: No acute or significant osseous abnormality. Review of the MIP images confirms the above findings. IMPRESSION: No evidence of acute pulmonary embolism to the proximal segmental level. More distal evaluation is limited by contrast bolus timing. Few patchy ground-glass opacity bilaterally primarily within the anterior right upper lobe. Likely represents infectious/inflammatory process. A mildly enlarged right suprahilar node is probably reactive. Electronically Signed   By: Guadlupe Spanish M.D.   On: 11/10/2021 18:37   DG Chest Portable 1 View  Result Date: 11/10/2021 CLINICAL DATA:  Shortness of breath EXAM: PORTABLE CHEST 1 VIEW COMPARISON:  Previous studies including the examination done earlier today FINDINGS: The heart size and mediastinal contours are within normal limits. Both lungs are clear. The visualized skeletal structures are unremarkable. IMPRESSION: No active disease. Electronically Signed   By: Harlan Stains.D.  On: 11/10/2021 15:57   DG Chest Portable 1 View  Result Date: 11/10/2021 CLINICAL DATA:  31 year old female with history of cough. Respiratory distress. EXAM: PORTABLE CHEST 1 VIEW COMPARISON:  Chest x-ray 06/09/2021. FINDINGS: Lung volumes are normal. No consolidative airspace disease. No pleural effusions. No pneumothorax. No pulmonary nodule or mass noted. Pulmonary vasculature and the cardiomediastinal silhouette are within normal limits. IMPRESSION: No radiographic evidence of acute cardiopulmonary disease. Electronically Signed   By: Trudie Reed M.D.   On: 11/10/2021 08:49    Procedures .Critical Care Performed by: Cheryll Cockayne, MD Authorized by: Cheryll Cockayne, MD   Critical care provider statement:    Critical care time (minutes):  40   Critical care time was exclusive of:  Separately billable procedures and treating other patients and teaching  time   Critical care was necessary to treat or prevent imminent or life-threatening deterioration of the following conditions:  Sepsis and respiratory failure    Medications Ordered in ED Medications  albuterol (PROVENTIL) (2.5 MG/3ML) 0.083% nebulizer solution (0 mg/hr Nebulization Stopped 11/10/21 1748)  LORazepam (ATIVAN) injection 2 mg (has no administration in time range)  acetaminophen (TYLENOL) tablet 650 mg (650 mg Oral Given 11/10/21 1523)  sodium chloride 0.9 % bolus 1,000 mL (0 mLs Intravenous Stopped 11/10/21 1702)  methylPREDNISolone sodium succinate (SOLU-MEDROL) 125 mg/2 mL injection 125 mg (125 mg Intravenous Given 11/10/21 1538)  piperacillin-tazobactam (ZOSYN) IVPB 3.375 g (0 g Intravenous Stopped 11/10/21 1850)  iohexol (OMNIPAQUE) 350 MG/ML injection 75 mL (75 mLs Intravenous Contrast Given 11/10/21 1821)  sodium chloride 0.9 % bolus 1,000 mL (0 mLs Intravenous Stopped 11/10/21 2018)    ED Course/ Medical Decision Making/ A&P                           Medical Decision Making Amount and/or Complexity of Data Reviewed Labs: ordered. Radiology: ordered.  Risk OTC drugs. Prescription drug management.   Patient was febrile tachycardic and ill-appearing on arrival.  She was placed on cardiac monitoring and continued to show sinus tachycardia.  Review of records shows prior visit at outside hospital earlier today.  Sepsis work-up initiated white count normal 6, lactic acid elevated 3.4.  Troponin is negative chemistry unremarkable.  COVID panel is negative.  Chest x-ray is unremarkable no evidence of pneumonia.  D-dimer however is elevated.  CT angio pursued.  Patient given IV Solu-Medrol and continuous nebulized treatment with significant improvement.  After continuous breathing treatments patient's breathing appears improved she is speaking in full complete sentences.  However she remains persistently tachycardic and tachypneic etiology unclear.  Additional IV fluid  resuscitation provided.  Lactic acid does appear to be trending downwards from 3.4-3.0.  Patient started on broad-spectrum antibiotics as a chest x-ray concerning for pneumonia.  Significant other again.  They state that she is a heavy drinker last drink was last night.  I suspect possible alcohol withdrawal may be playing a role.  Given additional Ativan 2 mg IV.  Given her persistent tachycardia and abnormal vitals, hospital 7 consulted for admission.          Final Clinical Impression(s) / ED Diagnoses Final diagnoses:  Severe asthma with exacerbation, unspecified whether persistent  Alcohol withdrawal syndrome without complication [facility])    Rx / DC Orders ED Discharge Orders     None         Cheryll Cockayne, MD 11/10/21 2033

## 2021-11-10 NOTE — ED Provider Notes (Signed)
[facility]-EMERGENCY DEPT Provider Note   CSN: [id number] Arrival date & time: 11/10/21  7591     History  Chief Complaint  Patient presents with   Cough    Kara Haynes is a 31 y.o. female.  The history is provided by the patient.  Cough Cough characteristics:  Non-productive Sputum characteristics:  Nondescript Severity:  Mild Onset quality:  Gradual Duration:  2 days Timing:  Constant Progression:  Unchanged Chronicity:  New Context: upper respiratory infection (runny nose, cough, chest tightness/SOB/asthma symptoms)   Relieved by:  Beta-agonist inhaler Exacerbated by: coughing. Associated symptoms: sinus congestion   Associated symptoms: no chest pain, no chills, no diaphoresis, no ear fullness, no ear pain, no eye discharge, no fever, no headaches, no myalgias, no rash, no rhinorrhea, no shortness of breath, no sore throat, no weight loss and no wheezing   Risk factors comment:  Hx of PE in past, asthma     Home Medications Prior to Admission medications   Medication Sig Start Date End Date Taking? Authorizing Provider  predniSONE (DELTASONE) 20 MG tablet Take 3 tablets (60 mg total) by mouth daily for 5 days. 11/10/21 11/15/21 Yes Dasiah Hooley, DO  albuterol (VENTOLIN HFA) 108 (90 Base) MCG/ACT inhaler Inhale 2 puffs into the lungs every 6 (six) hours as needed for wheezing or shortness of breath.     [provider]  cetirizine (ZYRTEC ALLERGY) 10 MG tablet Take 1 tablet (10 mg total) by mouth at bedtime. 06/09/21   Gailen Shelter, PA  fluticasone (FLOVENT HFA) 44 MCG/ACT inhaler Inhale 1 puff into the lungs daily. 06/09/21   Gailen Shelter, PA  pantoprazole (PROTONIX) 40 MG tablet Take 1 tablet (40 mg total) by mouth daily. Patient not taking: Reported on 05/27/2021 11/02/20   Sudie Grumbling, NP      Allergies    Fish allergy    Review of Systems   Review of Systems  Constitutional:  Negative for chills,  diaphoresis, fever and weight loss.  HENT:  Negative for ear pain, rhinorrhea and sore throat.   Eyes:  Negative for discharge.  Respiratory:  Positive for cough. Negative for shortness of breath and wheezing.   Cardiovascular:  Negative for chest pain.  Musculoskeletal:  Negative for myalgias.  Skin:  Negative for rash.  Neurological:  Negative for headaches.   Physical Exam Updated Vital Signs BP 110/63    Pulse (!) 115    Temp 98.7 F (37.1 C) (Oral)    Resp (!) 27    Ht 5\' 2"  (1.575 m)    Wt 81.6 kg    LMP 11/07/2021    SpO2 98%    BMI 32.92 kg/m  Physical Exam Vitals and nursing note reviewed.  Constitutional:      General: She is not in acute distress.    Appearance: She is well-developed. She is not ill-appearing.  HENT:     Head: Normocephalic and atraumatic.     Nose: Congestion present.  Eyes:     Extraocular Movements: Extraocular movements intact.     Conjunctiva/sclera: Conjunctivae normal.     Pupils: Pupils are equal, round, and reactive to light.  Cardiovascular:     Rate and Rhythm: Normal rate and regular rhythm.     Pulses: Normal pulses.     Heart sounds: No murmur heard. Pulmonary:     Effort: Pulmonary effort is normal. No respiratory distress.     Breath sounds: Wheezing present.  Abdominal:  Palpations: Abdomen is soft.     Tenderness: There is no abdominal tenderness.  Musculoskeletal:        General: No swelling.     Cervical back: Normal range of motion and neck supple.  Skin:    General: Skin is warm and dry.     Capillary Refill: Capillary refill takes less than 2 seconds.  Neurological:     General: No focal deficit present.     Mental Status: She is alert.  Psychiatric:        Mood and Affect: Mood normal.    ED Results / Procedures / Treatments   Labs (all labs ordered are listed, but only abnormal results are displayed) Labs Reviewed - No data to display  EKG EKG Interpretation  Date/Time:  Sunday November 10 2021 08:02:33  EST Ventricular Rate:  113 PR Interval:  107 QRS Duration: 91 QT Interval:  342 QTC Calculation: 469 R Axis:   69 Text Interpretation: Sinus tachycardia Borderline T wave abnormalities Confirmed by Ann Groeneveld ([id number]) on 11/10/2021 8:31:27 AM  Radiology No results found.  Procedures Procedures    Medications Ordered in ED Medications  albuterol (VENTOLIN HFA) 108 (90 Base) MCG/ACT inhaler 6 puff (6 puffs Inhalation Given 11/10/21 0753)  ipratropium (ATROVENT HFA) inhaler 2 puff (2 puffs Inhalation Given 11/10/21 0753)  predniSONE (DELTASONE) tablet 60 mg (60 mg Oral Given 11/10/21 0754)    ED Course/ Medical Decision Making/ A&P                           Medical Decision Making Amount and/or Complexity of Data Reviewed Radiology: ordered.  Risk Prescription drug management.   [name] is here with cough, asthma exacerbation.  Unremarkable vitals.  No fever.  Patient with overall wheezing throughout on exam with nasal congestion, cough.  Diminished air movement throughout.  She is not hypoxic and no increased work of breathing.  She has been using her inhaler once or twice a day without much improvement.  Differential diagnosis likely asthma versus URI.  She has a history of blood clot but not on blood thinners.  Overall have no suspicion for PE at this time given viral symptoms and asthma type symptoms.  She is not hypoxic and overall looks comfortable.  Sounds like she has been under utilizing her albuterol.  We will give her albuterol and Atrovent and prednisone here in the ED.  EKG per my review and interpretation shows sinus tachycardia with no ischemic changes.  Will obtain a chest x-ray.  Patient declined COVID and flu test.  Per my review and interpretation of the chest x-ray there is no obvious pneumonia or pneumothorax.  Feeling better after breathing treatments.  Further instructions on Atrovent and albuterol use given.  Will prescribe steroids.  No  concern for other process including acute coronary syndrome or pulmonary embolism.  She understands return precautions.  Overall suspect viral process causing asthma exacerbation.  This chart was dictated using voice recognition software.  Despite best efforts to proofread,  errors can occur which can change the documentation meaning.         Final Clinical Impression(s) / ED Diagnoses Final diagnoses:  Mild asthma with exacerbation, unspecified whether persistent    Rx / DC Orders ED Discharge Orders          Ordered    predniSONE (DELTASONE) 20 MG tablet  Daily        02 /26/23 0841  Virgina Norfolk, DO 11/10/21 [phone]

## 2021-11-11 ENCOUNTER — Inpatient Hospital Stay (HOSPITAL_COMMUNITY): Payer: Self-pay

## 2021-11-11 DIAGNOSIS — J45909 Unspecified asthma, uncomplicated: Secondary | ICD-10-CM | POA: Insufficient documentation

## 2021-11-11 DIAGNOSIS — J4551 Severe persistent asthma with (acute) exacerbation: Secondary | ICD-10-CM

## 2021-11-11 LAB — COMPREHENSIVE METABOLIC PANEL
ALT: 21 U/L (ref 0–44)
AST: 35 U/L (ref 15–41)
Albumin: 3.3 g/dL — ABNORMAL LOW (ref 3.5–5.0)
Alkaline Phosphatase: 47 U/L (ref 38–126)
Anion gap: 7 (ref 5–15)
BUN: <5 mg/dL — ABNORMAL LOW (ref 6–20)
CO2: 22 mmol/L (ref 22–32)
Calcium: 8.4 mg/dL — ABNORMAL LOW (ref 8.9–10.3)
Chloride: 107 mmol/L (ref 98–111)
Creatinine, Ser: 0.71 mg/dL (ref 0.44–1.00)
GFR, Estimated: >60 mL/min (ref >60)
Glucose, Bld: 148 mg/dL — ABNORMAL HIGH (ref 70–99)
Potassium: 3.8 mmol/L (ref 3.5–5.1)
Sodium: 136 mmol/L (ref 135–145)
Total Bilirubin: 0.6 mg/dL (ref 0.3–1.2)
Total Protein: 5.9 g/dL — ABNORMAL LOW (ref 6.5–8.1)

## 2021-11-11 LAB — CBC
HCT: 34.5 % — ABNORMAL LOW (ref 36.0–46.0)
Hemoglobin: 12.3 g/dL (ref 12.0–15.0)
MCH: 34.8 pg — ABNORMAL HIGH (ref 26.0–34.0)
MCHC: 35.7 g/dL (ref 30.0–36.0)
MCV: 97.7 fL (ref 80.0–100.0)
Platelets: 244 10*3/uL (ref 150–400)
RBC: 3.53 MIL/uL — ABNORMAL LOW (ref 3.87–5.11)
RDW: 14.5 % (ref 11.5–15.5)
WBC: 6.9 10*3/uL (ref 4.0–10.5)
nRBC: 0 % (ref 0.0–0.2)

## 2021-11-11 LAB — POCT I-STAT 7, (LYTES, BLD GAS, ICA,H+H)
Acid-Base Excess: 0 mmol/L (ref 0.0–2.0)
Bicarbonate: 24.9 mmol/L (ref 20.0–28.0)
Calcium, Ion: 1.28 mmol/L (ref 1.15–1.40)
HCT: 38 % (ref 36.0–46.0)
Hemoglobin: 12.9 g/dL (ref 12.0–15.0)
O2 Saturation: 98 %
Patient temperature: 97.6
Potassium: 4.1 mmol/L (ref 3.5–5.1)
Sodium: 134 mmol/L — ABNORMAL LOW (ref 135–145)
TCO2: 26 mmol/L (ref 22–32)
pCO2 arterial: 38.4 mmHg (ref 32–48)
pH, Arterial: 7.417 (ref 7.35–7.45)
pO2, Arterial: 107 mmHg (ref 83–108)

## 2021-11-11 LAB — HIV ANTIBODY (ROUTINE TESTING W REFLEX): HIV Screen 4th Generation wRfx: NONREACTIVE

## 2021-11-11 LAB — MAGNESIUM: Magnesium: 2.1 mg/dL (ref 1.7–2.4)

## 2021-11-11 LAB — MRSA NEXT GEN BY PCR, NASAL: MRSA by PCR Next Gen: NOT DETECTED

## 2021-11-11 LAB — PROCALCITONIN
Procalcitonin: <0.1 ng/mL
Procalcitonin: <0.1 ng/mL

## 2021-11-11 MED ORDER — LEVALBUTEROL HCL 0.63 MG/3ML IN NEBU
0.6300 mg | INHALATION_SOLUTION | RESPIRATORY_TRACT | Status: DC
  Administered 2021-11-11 – 2021-11-12 (×4): 0.63 mg via RESPIRATORY_TRACT
  Filled 2021-11-11 (×4): qty 3

## 2021-11-11 MED ORDER — DEXMEDETOMIDINE HCL IN NACL 400 MCG/100ML IV SOLN
0.4000 ug/kg/h | INTRAVENOUS | Status: DC
  Administered 2021-11-12: 0.4 ug/kg/h via INTRAVENOUS
  Filled 2021-11-11: qty 100

## 2021-11-11 MED ORDER — SODIUM CHLORIDE 0.9 % IV BOLUS
1000.0000 mL | Freq: Once | INTRAVENOUS | Status: AC
  Administered 2021-11-11: 1000 mL via INTRAVENOUS

## 2021-11-11 MED ORDER — DOCUSATE SODIUM 50 MG/5ML PO LIQD
100.0000 mg | Freq: Two times a day (BID) | ORAL | Status: DC
  Administered 2021-11-12 – 2021-11-13 (×3): 100 mg
  Filled 2021-11-11 (×3): qty 10

## 2021-11-11 MED ORDER — POLYETHYLENE GLYCOL 3350 17 G PO PACK
17.0000 g | PACK | Freq: Every day | ORAL | Status: DC
  Administered 2021-11-12: 17 g
  Filled 2021-11-11: qty 1

## 2021-11-11 MED ORDER — LORAZEPAM 1 MG PO TABS: 1.0000 mg | ORAL_TABLET | ORAL | Status: DC | PRN

## 2021-11-11 MED ORDER — KETAMINE HCL 50 MG/5ML IJ SOSY
PREFILLED_SYRINGE | INTRAMUSCULAR | Status: AC
  Filled 2021-11-11: qty 5

## 2021-11-11 MED ORDER — ROCURONIUM BROMIDE 10 MG/ML (PF) SYRINGE
PREFILLED_SYRINGE | INTRAVENOUS | Status: AC
  Filled 2021-11-11: qty 10

## 2021-11-11 MED ORDER — CEFTRIAXONE SODIUM 1 G IJ SOLR
1.0000 g | INTRAMUSCULAR | Status: DC
  Administered 2021-11-11: 1 g via INTRAVENOUS
  Filled 2021-11-11: qty 10

## 2021-11-11 MED ORDER — ALBUTEROL SULFATE (2.5 MG/3ML) 0.083% IN NEBU
5.0000 mg/h | INHALATION_SOLUTION | RESPIRATORY_TRACT | Status: DC
  Administered 2021-11-11: 5 mg/h via RESPIRATORY_TRACT
  Filled 2021-11-11: qty 3

## 2021-11-11 MED ORDER — CHLORHEXIDINE GLUCONATE CLOTH 2 % EX PADS
6.0000 | MEDICATED_PAD | Freq: Every day | CUTANEOUS | Status: DC
  Administered 2021-11-11 – 2021-11-19 (×9): 6 via TOPICAL

## 2021-11-11 MED ORDER — IPRATROPIUM-ALBUTEROL 0.5-2.5 (3) MG/3ML IN SOLN
3.0000 mL | RESPIRATORY_TRACT | Status: DC | PRN
  Administered 2021-11-12: 3 mL via RESPIRATORY_TRACT
  Filled 2021-11-11: qty 3

## 2021-11-11 MED ORDER — SODIUM CHLORIDE 0.9 % IV SOLN
0.1000 mg/kg/h | INTRAVENOUS | Status: DC
  Filled 2021-11-11: qty 5

## 2021-11-11 MED ORDER — METHYLPREDNISOLONE SODIUM SUCC 125 MG IJ SOLR
80.0000 mg | Freq: Two times a day (BID) | INTRAMUSCULAR | Status: DC
  Administered 2021-11-11 (×2): 80 mg via INTRAVENOUS
  Filled 2021-11-11 (×2): qty 2

## 2021-11-11 MED ORDER — ONDANSETRON HCL 4 MG/2ML IJ SOLN
4.0000 mg | Freq: Four times a day (QID) | INTRAMUSCULAR | Status: DC | PRN
  Administered 2021-11-11: 4 mg via INTRAVENOUS
  Filled 2021-11-11: qty 2

## 2021-11-11 MED ORDER — ACETAMINOPHEN 10 MG/ML IV SOLN
1000.0000 mg | Freq: Once | INTRAVENOUS | Status: AC
Start: 2021-11-12 — End: 2021-11-12
  Administered 2021-11-12: 1000 mg via INTRAVENOUS
  Filled 2021-11-11: qty 100

## 2021-11-11 MED ORDER — SUCCINYLCHOLINE CHLORIDE 200 MG/10ML IV SOSY
PREFILLED_SYRINGE | INTRAVENOUS | Status: AC
  Filled 2021-11-11: qty 10

## 2021-11-11 MED ORDER — METHYLPREDNISOLONE SODIUM SUCC 40 MG IJ SOLR
40.0000 mg | Freq: Four times a day (QID) | INTRAMUSCULAR | Status: DC
  Administered 2021-11-12: 40 mg via INTRAVENOUS
  Filled 2021-11-11 (×2): qty 1

## 2021-11-11 MED ORDER — LORAZEPAM 2 MG/ML IJ SOLN
1.0000 mg | INTRAMUSCULAR | Status: DC | PRN
  Administered 2021-11-11 (×3): 2 mg via INTRAVENOUS
  Filled 2021-11-11 (×2): qty 1
  Filled 2021-11-11: qty 2

## 2021-11-11 MED ORDER — FLUTICASONE FUROATE-VILANTEROL 200-25 MCG/ACT IN AEPB
1.0000 | INHALATION_SPRAY | Freq: Every day | RESPIRATORY_TRACT | Status: DC
  Administered 2021-11-11 – 2021-11-17 (×2): 1 via RESPIRATORY_TRACT
  Filled 2021-11-11: qty 28

## 2021-11-11 MED ORDER — ETOMIDATE 2 MG/ML IV SOLN
INTRAVENOUS | Status: AC
  Filled 2021-11-11: qty 20

## 2021-11-11 MED ORDER — LEVALBUTEROL HCL 0.63 MG/3ML IN NEBU
0.6300 mg | INHALATION_SOLUTION | Freq: Four times a day (QID) | RESPIRATORY_TRACT | Status: DC | PRN
  Administered 2021-11-11: 0.63 mg via RESPIRATORY_TRACT
  Filled 2021-11-11: qty 3

## 2021-11-11 MED ORDER — HYDRALAZINE HCL 20 MG/ML IJ SOLN
10.0000 mg | INTRAMUSCULAR | Status: DC | PRN
  Administered 2021-11-11 – 2021-11-16 (×5): 10 mg via INTRAVENOUS
  Filled 2021-11-11 (×6): qty 1

## 2021-11-11 MED ORDER — KETAMINE BOLUS VIA INFUSION
0.5000 mg/kg | Freq: Once | INTRAVENOUS | Status: DC
  Filled 2021-11-11: qty 45

## 2021-11-11 MED ORDER — IPRATROPIUM-ALBUTEROL 0.5-2.5 (3) MG/3ML IN SOLN
3.0000 mL | RESPIRATORY_TRACT | Status: DC
  Administered 2021-11-11: 3 mL via RESPIRATORY_TRACT
  Filled 2021-11-11: qty 3

## 2021-11-11 NOTE — Progress Notes (Signed)
Patient is insisting she does not want intubation. Her girl friend is at bedside . I have asked permission to contact family. She declines . Ground team to discuss with patient further

## 2021-11-11 NOTE — Progress Notes (Signed)
Pt transported on BIPAP thru the [facility] from ED 06 to 11M 06 without complications, report given to ICU RT. RT, RN and Paramedic accompanied the patient. RT will continue to monitor and be available as needed.

## 2021-11-11 NOTE — Progress Notes (Addendum)
31 y.o. female with asthma, GERD, alcohol use, hepatic steatosis, pulmonary embolism, pelvic adhesive disease, retroperitoneal fibrosis, endometriosis who presents with worsening shortness of breath.  Patient was seen in the emergency department on 2/26 and was sent home with prednisone and inhalers.  She returned back to the ED the same day due to worsening shortness of breath.  She was admitted with asthma exacerbation. Not on any maintenance therapy.   When [facility] inially callled, BiPAP was recently stopped, she was apparently breathing in the high 30's, [facility] contacted, back non biPAP about 30 minutes and looked much better.   Upon re-assessment, she again seemed to have declined, with HR 150, RR 35-40, on biPAP 12/5  , TV's 450-600 intermittently  Plans: - ABG/CXR - continue BiPAP-> case d/w NP Hoffman to assess need for invasive support  - nebs q2h for now  - Magnesium 2gm  - solumedrol ordered - spiking temp- Ordered IV Tylenol + Ceftriaxone 1gm  - on CIWA  protocol- will start Precedex infusion - will help with resp status and BiPAP tolerance as well.

## 2021-11-11 NOTE — Consult Note (Signed)
NAME:  Kara Haynes, MRN:  [id number], DOB:  1991/02/20, LOS: 0 ADMISSION DATE:  11/10/2021, CONSULTATION DATE:  11/11/2021 REFERRING MD:  Dr. Alvino Chapel, CHIEF COMPLAINT:  Asthma Exacerbation    History of Present Illness:  Kara Haynes is a 31 y.o. female with a PMH significant for Asthma, Prior PE in 2015, GERD, obesity, and alcohol abuse who presented to the ED for  respiratory distress felt secondary to an asthma exacerbation that started 3 days prior to admission. Patient attempted to use home rescue inhaler with no relief in dyspnea. Of note patient was seen at [facility] ED for similar symptoms early this am but was felt safe to discharge from ED.  On ED presentation patient was seen ill appearing with fever, tachycardia, and tachypnea.  Concern for acute asthma exacerbation possibly next in the setting of viral illness.  Lab work significant for glucose 148, albumin 3.3, lactic acid 3.0 with downtrend to 1.8.  Given persistent tachycardia tachypnea and severe bronchospasm patient was placed on BiPAP and critical care/pulmonary was consulted.  Pertinent  Medical History  Asthma Prior PE in 2015 GERD Alcohol abuse  Significant Hospital Events: Including procedures, antibiotic start and stop dates in addition to other pertinent events   2/27 presented to [facility], ED for complaints of chest tightness and dyspnea found to be in severe asthma exacerbation  Interim History / Subjective:  As above  Objective   Blood pressure (!) 152/107, pulse (!) 123, temperature 99.9 F (37.7 C), temperature source Oral, resp. rate (!) 31, height 5\' 2"  (1.575 m), weight 81.6 kg, last menstrual period 11/07/2021, SpO2 96 %.        Intake/Output Summary (Last 24 hours) at 11/11/2021 1018 Last data filed at 11/10/2021 2201 Gross per 24 hour  Intake 2367.01 ml  Output --  Net 2367.01 ml   Filed Weights   11/10/21 1546  Weight: 81.6 kg    Examination: General: Acute ill appearing  adult female lying in bed in mild respiratory distress on BiPAP HEENT: BiPAP in place , MM pink/moist, PERRL,  Neuro: Alert and oriented x3, non-focal  CV: s1s2 regular rate and rhythm, no murmur, rubs, or gallops,  PULM:  Severe bronchospasm bilaterally, tolerating BIPAP   GI: soft, bowel sounds active in all 4 quadrants, non-tender, non-distended Extremities: warm/dry, no edema  Skin: no rashes or lesions  Resolved Hospital Problem list     Assessment & Plan:  Severe asthma exacerbation -At baseline patient utilizes rescue albuterol only at baseline -CT chest unremarkable  P: Admit to ICU for close monitoring Very low threshold to intubate Continue bronchodilators IV steroids  RVP pending  In the CBC and fever curve and check procalcitonin to determine need for further antibiotics  Alcohol abuse -Patient states she takes about 12 shots per day with estimated last alcoholic consumption 2/25 P: CIWA scale Close monitoring for alcohol withdrawal symptoms Supplement thiamine, folic acid, and multivitamin  Mild hyperglycemia P: Monitor CBG for worsening hyperglycemia in setting of steroid use CBG goal 1 40-1 80  Best Practice (right click and "Reselect all SmartList Selections" daily)   Diet/type: NPO DVT prophylaxis: SCD GI prophylaxis: PPI Lines: N/A Foley:  N/A Code Status:  full code Last date of multidisciplinary goals of care discussion: Pending   Labs   CBC: Recent Labs  Lab 11/10/21 1517 11/11/21 0453  WBC 6.0 6.9  NEUTROABS 5.6  --   HGB 13.9 12.3  HCT 40.8 34.5*  MCV 99.0 97.7  PLT  273 244    Basic Metabolic Panel: Recent Labs  Lab 11/10/21 1517 11/10/21 2042 11/11/21 0453  NA 134*  --  136  K 3.8  --  3.8  CL 101  --  107  CO2 20*  --  22  GLUCOSE 178*  --  148*  BUN <5*  --  <5*  CREATININE 0.89  --  0.71  CALCIUM 9.2  --  8.4*  MG  --  1.3* 2.1  PHOS  --  2.6  --    GFR: Estimated Creatinine Clearance: 100.9 mL/min (by C-G  formula based on SCr of 0.71 mg/dL). Recent Labs  Lab 11/10/21 1517 11/10/21 1802 11/10/21 2042 11/10/21 2305 11/11/21 0453  PROCALCITON  --   --  <0.10  --   --   WBC 6.0  --   --   --  6.9  LATICACIDVEN 3.4* 3.0*  --  1.8  --     Liver Function Tests: Recent Labs  Lab 11/10/21 1517 11/11/21 0453  AST 59* 35  ALT 28 21  ALKPHOS 65 47  BILITOT 0.7 0.6  PROT 7.2 5.9*  ALBUMIN 3.9 3.3*   No results for input(s): LIPASE, AMYLASE in the last 168 hours. No results for input(s): AMMONIA in the last 168 hours.  ABG    Component Value Date/Time   PHART 7.287 (L) 08/09/2014 2335   PCO2ART 32.2 (L) 08/09/2014 2335   PO2ART 39.0 (LL) 08/09/2014 2335   HCO3 17.9 (L) 01/15/2016 0645   TCO2 19 01/15/2016 0656   ACIDBASEDEF 8.0 (H) 01/15/2016 0645   O2SAT 86.0 01/15/2016 0645     Coagulation Profile: Recent Labs  Lab 11/10/21 1530  INR 0.9    Cardiac Enzymes: No results for input(s): CKTOTAL, CKMB, CKMBINDEX, TROPONINI in the last 168 hours.  HbA1C: Hgb A1c MFr Bld  Date/Time Value Ref Range Status  08/10/2014 01:00 AM 4.6 <5.7 % Final    Comment:    (NOTE)                                                                       According to the ADA Clinical Practice Recommendations for 2011, when HbA1c is used as a screening test:  >=6.5%   Diagnostic of Diabetes Mellitus           (if abnormal result is confirmed) 5.7-6.4%   Increased risk of developing Diabetes Mellitus References:Diagnosis and Classification of Diabetes Mellitus,Diabetes Care,2011,34(Suppl 1):S62-S69 and Standards of Medical Care in         Diabetes - 2011,Diabetes Care,2011,34 (Suppl 1):S11-S61.     CBG: No results for input(s): GLUCAP in the last 168 hours.  Review of Systems:   Please see the history of present illness. All other systems reviewed and are negative   Past Medical History:  She,  has a past medical history of Alcohol abuse, Allergic rhinitis, seasonal, Dyspnea, Eczema,  GERD (gastroesophageal reflux disease), History of pulmonary embolus (PE) (08/09/2014), History of trichomoniasis, Left ovarian cyst, Mild intermittent asthma, Obesity (BMI 30-39.9), Pelvic adhesive disease, and Pneumonia.   Surgical History:   Past Surgical History:  Procedure Laterality Date   ABDOMINAL HYSTERECTOMY     LAPAROSCOPIC OVARIAN CYSTECTOMY Left 06/05/2020   Procedure: LAPAROSCOPIC OVARIAN  CYSTECTOMY;  Surgeon: Istachatta Bing, MD;  Location: [facility];  Service: Gynecology;  Laterality: Left;   NO PAST SURGERIES     ROBOTIC ASSISTED TOTAL HYSTERECTOMY WITH BILATERAL SALPINGO OOPHERECTOMY N/A 07/03/2020   Procedure: XI ROBOTIC ASSISTED  LEFT  SALPINGO OOPHORECTOMY/ LAPAROSCOPIC LYSIS OF ADHESIONS/LEFT URETEROLYSIS;  Surgeon: Adolphus Birchwood, MD;  Location: [facility];  Service: Gynecology;  Laterality: N/A;     Social History:   reports that she has been smoking cigarettes. She has a 6.00 pack-year smoking history. She has never used smokeless tobacco. She reports current alcohol use. She reports current drug use. Drug: Marijuana.   Family History:  Her family history includes Asthma in her maternal grandfather; Healthy in her father and mother; Hypertension in her maternal grandfather; Other in an other family member.   Allergies Allergies  Allergen Reactions   Fish Allergy Hives, Shortness Of Breath and Swelling    Mostly tilapia     Home Medications  Prior to Admission medications   Medication Sig Start Date End Date Taking? Authorizing Provider  acetaminophen (TYLENOL) 500 MG tablet Take 500 mg by mouth every 6 (six) hours as needed for mild pain or moderate pain.   Yes [provider]  albuterol (VENTOLIN HFA) 108 (90 Base) MCG/ACT inhaler Inhale 2 puffs into the lungs every 6 (six) hours as needed for wheezing or shortness of breath.    Yes [provider]  ipratropium (ATROVENT HFA) 17 MCG/ACT inhaler Inhale 2 puffs into the lungs every 4  (four) hours as needed for wheezing.   Yes [provider]  cetirizine (ZYRTEC ALLERGY) 10 MG tablet Take 1 tablet (10 mg total) by mouth at bedtime. Patient not taking: Reported on 11/10/2021 06/09/21   Gailen Shelter, PA  fluticasone (FLOVENT HFA) 44 MCG/ACT inhaler Inhale 1 puff into the lungs daily. Patient not taking: Reported on 11/10/2021 06/09/21   Gailen Shelter, PA  pantoprazole (PROTONIX) 40 MG tablet Take 1 tablet (40 mg total) by mouth daily. Patient not taking: Reported on 05/27/2021 11/02/20   Sudie Grumbling, NP  predniSONE (DELTASONE) 20 MG tablet Take 3 tablets (60 mg total) by mouth daily for 5 days. 11/10/21 11/15/21  Virgina Norfolk, DO     Critical care time:   CRITICAL CARE Performed by: Idus Rathke D. Harris  Total critical care time: 40 minutes  Critical care time was exclusive of separately billable procedures and treating other patients.  Critical care was necessary to treat or prevent imminent or life-threatening deterioration.  Critical care was time spent personally by me on the following activities: development of treatment plan with patient and/or surrogate as well as nursing, discussions with consultants, evaluation of patient's response to treatment, examination of patient, obtaining history from patient or surrogate, ordering and performing treatments and interventions, ordering and review of laboratory studies, ordering and review of radiographic studies, pulse oximetry and re-evaluation of patient's condition.  Jasiel Apachito D. Tiburcio Pea, NP-C [facility] Personal contact information can be found on Amion  11/11/2021, 11:05 AM

## 2021-11-11 NOTE — Progress Notes (Signed)
Contacted [facility] again with concerns of dyspnea/tachypnea/tachycardia. Work of breathing has increased extensively.  Unable to maintain volumes on bipap settings. Neub Tx given. Temp 102.4, Hypertensive . Requested ground team to evaluate at bedside. Outcome pending.

## 2021-11-11 NOTE — Progress Notes (Signed)
PROGRESS NOTE    Kara Haynes  HWT:[id number] DOB: 05-21-1991 DOA: 11/10/2021 PCP: Evlyn Kanner, MD     Brief Narrative:  Kara Haynes is a 31 y.o. female with medical history significant of asthma, GERD, alcohol use, hepatic steatosis, pulmonary embolism, pelvic adhesive disease, retroperitoneal fibrosis, endometriosis who presents with worsening shortness of breath.  Patient was seen in the emergency department on 2/26 and was sent home with prednisone and inhalers.  She returned back to the ED the same day due to worsening shortness of breath.  She was admitted with asthma exacerbation.  New events last 24 hours / Subjective: Patient was seen in the emergency department.  She reported feeling about the same since admission.  Still with some shortness of breath  Patient was reevaluated after about 2 hours when RN reported patient feeling worse with worsening shortness of breath.  On reevaluation, patient remained tachycardic with heart rate in the 130s, tachypneic with respiratory rate up to 47.  Patient had diminished breath sounds bilaterally and working hard to breathe.  PCCM was consulted at that time.  Assessment & Plan:   Principal Problem:   Asthma exacerbation Active Problems:   PNA (pneumonia)   Hepatic steatosis   Alcohol abuse   Transaminitis   Asthma   Severe asthma exacerbation -Progressive worsening over the last 24 hours.  Ordered albuterol continuous neb treatment, may need BiPAP or progress to ICU status -COVID and flu were negative.  Viral respiratory panel is pending -Procalcitonin negative, WBC normal.  Holding off on further antibiotics at this time -Solu-Medrol -PCCM consulted  Alcohol abuse -States that she takes in about 12 shots daily, last drink was 2 days ago (2/25) -Monitor for withdrawal and CIWA protocol -Folic acid, thiamine, multivitamin   DVT prophylaxis:  enoxaparin (LOVENOX) injection 40 mg Start: 11/10/21  2100  Code Status: Full code Family Communication: At bedside Disposition Plan:  Status is: Inpatient Remains inpatient appropriate because: Respiratory failure     Consultants:  PCCM  Procedures:  None  Antimicrobials:  Anti-infectives (From admission, onward)    Start     Dose/Rate Route Frequency Ordered Stop   11/10/21 2045  azithromycin (ZITHROMAX) 500 mg in sodium chloride 0.9 % 250 mL IVPB        500 mg 250 mL/hr over 60 Minutes Intravenous  Once 11/10/21 2039 11/10/21 2201   11/10/21 1815  piperacillin-tazobactam (ZOSYN) IVPB 3.375 g        3.375 g 100 mL/hr over 30 Minutes Intravenous  Once 11/10/21 1800 11/10/21 1850        Objective: Vitals:   11/11/21 0630 11/11/21 0700 11/11/21 0730 11/11/21 0800  BP: 127/85 (!) 142/94 (!) 139/92 (!) 152/107  Pulse: (!) 115 (!) 119 (!) 132 (!) 123  Resp: (!) 21 (!) 29 20 (!) 31  Temp:      TempSrc:      SpO2: 92% 94% 94% 96%  Weight:      Height:        Intake/Output Summary (Last 24 hours) at 11/11/2021 1043 Last data filed at 11/10/2021 2201 Gross per 24 hour  Intake 2367.01 ml  Output --  Net 2367.01 ml   Filed Weights   11/10/21 1546  Weight: 81.6 kg    Examination:  General exam: Appears calm but in clear respiratory distress Respiratory system: Diminished breath sounds bilaterally, some wheezes anteriorly, with conversational dyspnea and tachypnea Cardiovascular system: S1 & S2 heard, tachycardic. No murmurs. No pedal edema. Gastrointestinal system:  Abdomen is nondistended, soft Central nervous system: Alert and oriented Extremities: Symmetric in appearance  Psychiatry: Judgement and insight appear normal. Mood & affect appropriate.   Data Reviewed: I have personally reviewed following labs and imaging studies  CBC: Recent Labs  Lab 11/10/21 1517 11/11/21 0453  WBC 6.0 6.9  NEUTROABS 5.6  --   HGB 13.9 12.3  HCT 40.8 34.5*  MCV 99.0 97.7  PLT 273 244   Basic Metabolic Panel: Recent Labs   Lab 11/10/21 1517 11/10/21 2042 11/11/21 0453  NA 134*  --  136  K 3.8  --  3.8  CL 101  --  107  CO2 20*  --  22  GLUCOSE 178*  --  148*  BUN <5*  --  <5*  CREATININE 0.89  --  0.71  CALCIUM 9.2  --  8.4*  MG  --  1.3* 2.1  PHOS  --  2.6  --    GFR: Estimated Creatinine Clearance: 100.9 mL/min (by C-G formula based on SCr of 0.71 mg/dL). Liver Function Tests: Recent Labs  Lab 11/10/21 1517 11/11/21 0453  AST 59* 35  ALT 28 21  ALKPHOS 65 47  BILITOT 0.7 0.6  PROT 7.2 5.9*  ALBUMIN 3.9 3.3*   No results for input(s): LIPASE, AMYLASE in the last 168 hours. No results for input(s): AMMONIA in the last 168 hours. Coagulation Profile: Recent Labs  Lab 11/10/21 1530  INR 0.9   Cardiac Enzymes: No results for input(s): CKTOTAL, CKMB, CKMBINDEX, TROPONINI in the last 168 hours. BNP (last 3 results) No results for input(s): PROBNP in the last 8760 hours. HbA1C: No results for input(s): HGBA1C in the last 72 hours. CBG: No results for input(s): GLUCAP in the last 168 hours. Lipid Profile: No results for input(s): CHOL, HDL, LDLCALC, TRIG, CHOLHDL, LDLDIRECT in the last 72 hours. Thyroid Function Tests: No results for input(s): TSH, T4TOTAL, FREET4, T3FREE, THYROIDAB in the last 72 hours. Anemia Panel: No results for input(s): VITAMINB12, FOLATE, FERRITIN, TIBC, IRON, RETICCTPCT in the last 72 hours. Sepsis Labs: Recent Labs  Lab 11/10/21 1517 11/10/21 1802 11/10/21 2042 11/10/21 2305  PROCALCITON  --   --  <0.10  --   LATICACIDVEN 3.4* 3.0*  --  1.8    Recent Results (from the past 240 hour(s))  Resp Panel by RT-PCR (Flu A&B, Covid) Nasopharyngeal Swab     Status: None   Collection Time: 11/10/21  3:13 PM   Specimen: Nasopharyngeal Swab; Nasopharyngeal(NP) swabs in vial transport medium  Result Value Ref Range Status   SARS Coronavirus 2 by RT PCR NEGATIVE NEGATIVE Final    Comment: (NOTE) SARS-CoV-2 target nucleic acids are NOT DETECTED.  The  SARS-CoV-2 RNA is generally detectable in upper respiratory specimens during the acute phase of infection. The lowest concentration of SARS-CoV-2 viral copies this assay can detect is 138 copies/mL. A negative result does not preclude SARS-Cov-2 infection and should not be used as the sole basis for treatment or other patient management decisions. A negative result may occur with  improper specimen collection/handling, submission of specimen other than nasopharyngeal swab, presence of viral mutation(s) within the areas targeted by this assay, and inadequate number of viral copies(<138 copies/mL). A negative result must be combined with clinical observations, patient history, and epidemiological information. The expected result is Negative.  Fact Sheet for Patients:  BloggerCourse.comhttps://www.fda.gov/media/152166/download  Fact Sheet for Healthcare Providers:  SeriousBroker.ithttps://www.fda.gov/media/152162/download  This test is no t yet approved or cleared by the [place] FDA  and  has been authorized for detection and/or diagnosis of SARS-CoV-2 by FDA under an Emergency Use Authorization (EUA). This EUA will remain  in effect (meaning this test can be used) for the duration of the COVID-19 declaration under Section 564(b)(1) of the Act, 21 U.S.C.section 360bbb-3(b)(1), unless the authorization is terminated  or revoked sooner.       Influenza A by PCR NEGATIVE NEGATIVE Final   Influenza B by PCR NEGATIVE NEGATIVE Final    Comment: (NOTE) The Xpert Xpress SARS-CoV-2/FLU/RSV plus assay is intended as an aid in the diagnosis of influenza from Nasopharyngeal swab specimens and should not be used as a sole basis for treatment. Nasal washings and aspirates are unacceptable for Xpert Xpress SARS-CoV-2/FLU/RSV testing.  Fact Sheet for Patients: BloggerCourse.com  Fact Sheet for Healthcare Providers: SeriousBroker.it  This test is not yet approved or  cleared by the [place] FDA and has been authorized for detection and/or diagnosis of SARS-CoV-2 by FDA under an Emergency Use Authorization (EUA). This EUA will remain in effect (meaning this test can be used) for the duration of the COVID-19 declaration under Section 564(b)(1) of the Act, 21 U.S.C. section 360bbb-3(b)(1), unless the authorization is terminated or revoked.  Performed at [facility], [address], [place], [place] [address]   Culture, blood (Routine x 2)     Status: None (Preliminary result)   Collection Time: 11/10/21  3:28 PM   Specimen: BLOOD  Result Value Ref Range Status   Specimen Description BLOOD LEFT ANTECUBITAL  Final   Special Requests   Final    BOTTLES DRAWN AEROBIC AND ANAEROBIC Blood Culture adequate volume   Culture   Final    NO GROWTH < 24 HOURS Performed at [facility], [address], [place], [place] [address]    Report Status PENDING  Incomplete  Culture, blood (Routine x 2)     Status: None (Preliminary result)   Collection Time: 11/10/21  6:01 PM   Specimen: BLOOD  Result Value Ref Range Status   Specimen Description BLOOD SITE NOT SPECIFIED  Final   Special Requests   Final    BOTTLES DRAWN AEROBIC AND ANAEROBIC Blood Culture results may not be optimal due to an excessive volume of blood received in culture bottles   Culture   Final    NO GROWTH < 24 HOURS Performed at [facility], [address] [place], [place] [address]    Report Status PENDING  Incomplete      Radiology Studies: CT Angio Chest PE W and/or Wo Contrast  Result Date: 11/10/2021 CLINICAL DATA:  Pulmonary embolism (PE) suspected, unknown D-dimer EXAM: CT ANGIOGRAPHY CHEST WITH CONTRAST TECHNIQUE: Multidetector CT imaging of the chest was performed using the standard protocol during bolus administration of intravenous contrast. Multiplanar CT image reconstructions and MIPs were obtained to evaluate the vascular anatomy. RADIATION DOSE  REDUCTION: This exam was performed according to the departmental dose-optimization program which includes automated exposure control, adjustment of the mA and/or kV according to patient size and/or use of iterative reconstruction technique. CONTRAST:  4mL OMNIPAQUE IOHEXOL 350 MG/ML SOLN COMPARISON:  April 2019 FINDINGS: Cardiovascular: Satisfactory opacification of the pulmonary arteries to the proximal segmental level. No evidence of pulmonary embolism. Normal heart size. No pericardial effusion. Mediastinum/Nodes: 1.5 cm right suprahilar node. No enlarged mediastinal nodes. Included thyroid is unremarkable. Esophagus is unremarkable. Lungs/Pleura: Patchy ground-glass density bilaterally primarily within the anterior right upper lobe. No pleural effusion or pneumothorax. Upper  Abdomen: No acute abnormality. Possible too small to characterize low-density lesion of the right hepatic lobe. Musculoskeletal: No acute or significant osseous abnormality. Review of the MIP images confirms the above findings. IMPRESSION: No evidence of acute pulmonary embolism to the proximal segmental level. More distal evaluation is limited by contrast bolus timing. Few patchy ground-glass opacity bilaterally primarily within the anterior right upper lobe. Likely represents infectious/inflammatory process. A mildly enlarged right suprahilar node is probably reactive. Electronically Signed   By: Guadlupe Spanish M.D.   On: 11/10/2021 18:37   DG Chest Portable 1 View  Result Date: 11/10/2021 CLINICAL DATA:  Shortness of breath EXAM: PORTABLE CHEST 1 VIEW COMPARISON:  Previous studies including the examination done earlier today FINDINGS: The heart size and mediastinal contours are within normal limits. Both lungs are clear. The visualized skeletal structures are unremarkable. IMPRESSION: No active disease. Electronically Signed   By: Ernie Avena M.D.   On: 11/10/2021 15:57   DG Chest Portable 1 View  Result Date:  11/10/2021 CLINICAL DATA:  31 year old female with history of cough. Respiratory distress. EXAM: PORTABLE CHEST 1 VIEW COMPARISON:  Chest x-ray 06/09/2021. FINDINGS: Lung volumes are normal. No consolidative airspace disease. No pleural effusions. No pneumothorax. No pulmonary nodule or mass noted. Pulmonary vasculature and the cardiomediastinal silhouette are within normal limits. IMPRESSION: No radiographic evidence of acute cardiopulmonary disease. Electronically Signed   By: Trudie Reed M.D.   On: 11/10/2021 08:49      Scheduled Meds:  budesonide  0.25 mg Inhalation BID   enoxaparin (LOVENOX) injection  40 mg Subcutaneous Q24H   folic acid  1 mg Oral Daily   ipratropium-albuterol  3 mL Nebulization Q6H   methylPREDNISolone (SOLU-MEDROL) injection  80 mg Intravenous Q12H   multivitamin with minerals  1 tablet Oral Daily   sodium chloride flush  3 mL Intravenous Q12H   thiamine  100 mg Oral Daily   Or   thiamine  100 mg Intravenous Daily   Continuous Infusions:  albuterol 5 mg/hr (11/11/21 1030)     LOS: 0 days     Noralee Stain, DO [facility] 11/11/2021, 10:43 AM   Available via Epic secure chat 7am-7pm After these hours, please refer to coverage provider listed on [email or web address]

## 2021-11-11 NOTE — Progress Notes (Addendum)
7:40, Patient significantly SOB, tight with airway movement . Wheezing noted all 4 fields. Day shift RN currently working to administer neub tx.  Patient is requesting to eat , although significantly SOB. I have counseled her on importance of staying NPO till respiratory effort improved. Contacted [facility] and spoke to RN about fear of high risk for intubation. Respiratory at bedside for bipap placement : Neubx just increased to Q4 instead of Q8. Care assumed from day shift RN at 7:40

## 2021-11-11 NOTE — ED Notes (Signed)
Breakfast order placed ?

## 2021-11-11 NOTE — Progress Notes (Signed)
Patient taken off of bipap and placed on 3L [place].  Tolerating well at this time.  Will continue to monitor.

## 2021-11-12 ENCOUNTER — Inpatient Hospital Stay (HOSPITAL_COMMUNITY): Payer: Self-pay

## 2021-11-12 DIAGNOSIS — J189 Pneumonia, unspecified organism: Secondary | ICD-10-CM | POA: Diagnosis present

## 2021-11-12 DIAGNOSIS — J9602 Acute respiratory failure with hypercapnia: Secondary | ICD-10-CM | POA: Diagnosis present

## 2021-11-12 DIAGNOSIS — E8729 Other acidosis: Secondary | ICD-10-CM | POA: Diagnosis present

## 2021-11-12 DIAGNOSIS — F10930 Alcohol use, unspecified with withdrawal, uncomplicated: Secondary | ICD-10-CM | POA: Diagnosis present

## 2021-11-12 LAB — POCT I-STAT 7, (LYTES, BLD GAS, ICA,H+H)
Acid-Base Excess: 0 mmol/L (ref 0.0–2.0)
Acid-base deficit: 1 mmol/L (ref 0.0–2.0)
Acid-base deficit: 5 mmol/L — ABNORMAL HIGH (ref 0.0–2.0)
Acid-base deficit: 7 mmol/L — ABNORMAL HIGH (ref 0.0–2.0)
Acid-base deficit: 9 mmol/L — ABNORMAL HIGH (ref 0.0–2.0)
Acid-base deficit: 11 mmol/L — ABNORMAL HIGH (ref 0.0–2.0)
Acid-base deficit: 7 mmol/L — ABNORMAL HIGH (ref 0.0–2.0)
Bicarbonate: 23.8 mmol/L (ref 20.0–28.0)
Bicarbonate: 24 mmol/L (ref 20.0–28.0)
Bicarbonate: 25.8 mmol/L (ref 20.0–28.0)
Bicarbonate: 23.4 mmol/L (ref 20.0–28.0)
Bicarbonate: 21.4 mmol/L (ref 20.0–28.0)
Bicarbonate: 20.6 mmol/L (ref 20.0–28.0)
Bicarbonate: 22.2 mmol/L (ref 20.0–28.0)
Calcium, Ion: 1.22 mmol/L (ref 1.15–1.40)
Calcium, Ion: 1.23 mmol/L (ref 1.15–1.40)
Calcium, Ion: 1.28 mmol/L (ref 1.15–1.40)
Calcium, Ion: 1.26 mmol/L (ref 1.15–1.40)
Calcium, Ion: 1.22 mmol/L (ref 1.15–1.40)
Calcium, Ion: 1.24 mmol/L (ref 1.15–1.40)
Calcium, Ion: 1.24 mmol/L (ref 1.15–1.40)
HCT: 39 % (ref 36.0–46.0)
HCT: 38 % (ref 36.0–46.0)
HCT: 40 % (ref 36.0–46.0)
HCT: 39 % (ref 36.0–46.0)
HCT: 36 % (ref 36.0–46.0)
HCT: 38 % (ref 36.0–46.0)
HCT: 36 % (ref 36.0–46.0)
Hemoglobin: 13.3 g/dL (ref 12.0–15.0)
Hemoglobin: 12.9 g/dL (ref 12.0–15.0)
Hemoglobin: 13.6 g/dL (ref 12.0–15.0)
Hemoglobin: 13.3 g/dL (ref 12.0–15.0)
Hemoglobin: 12.2 g/dL (ref 12.0–15.0)
Hemoglobin: 12.9 g/dL (ref 12.0–15.0)
Hemoglobin: 12.2 g/dL (ref 12.0–15.0)
O2 Saturation: 100 %
O2 Saturation: 96 %
O2 Saturation: 99 %
O2 Saturation: 98 %
O2 Saturation: 99 %
O2 Saturation: 99 %
O2 Saturation: 99 %
Patient temperature: 99.1
Patient temperature: 99.1
Patient temperature: 98.4
Patient temperature: 98.4
Patient temperature: 97.7
Patient temperature: 97.7
Patient temperature: 97.7
Potassium: 4.2 mmol/L (ref 3.5–5.1)
Potassium: 4.4 mmol/L (ref 3.5–5.1)
Potassium: 3.7 mmol/L (ref 3.5–5.1)
Potassium: 3.4 mmol/L — ABNORMAL LOW (ref 3.5–5.1)
Potassium: 3.3 mmol/L — ABNORMAL LOW (ref 3.5–5.1)
Potassium: 3.2 mmol/L — ABNORMAL LOW (ref 3.5–5.1)
Potassium: 3.4 mmol/L — ABNORMAL LOW (ref 3.5–5.1)
Sodium: 133 mmol/L — ABNORMAL LOW (ref 135–145)
Sodium: 134 mmol/L — ABNORMAL LOW (ref 135–145)
Sodium: 134 mmol/L — ABNORMAL LOW (ref 135–145)
Sodium: 134 mmol/L — ABNORMAL LOW (ref 135–145)
Sodium: 134 mmol/L — ABNORMAL LOW (ref 135–145)
Sodium: 135 mmol/L (ref 135–145)
Sodium: 138 mmol/L (ref 135–145)
TCO2: 25 mmol/L (ref 22–32)
TCO2: 25 mmol/L (ref 22–32)
TCO2: 28 mmol/L (ref 22–32)
TCO2: 25 mmol/L (ref 22–32)
TCO2: 24 mmol/L (ref 22–32)
TCO2: 23 mmol/L (ref 22–32)
TCO2: 24 mmol/L (ref 22–32)
pCO2 arterial: 40.7 mmHg (ref 32–48)
pCO2 arterial: 37.2 mmHg (ref 32–48)
pCO2 arterial: 72.8 mmHg (ref 32–48)
pCO2 arterial: 70.3 mmHg (ref 32–48)
pCO2 arterial: 67.6 mmHg (ref 32–48)
pCO2 arterial: 71.3 mmHg (ref 32–48)
pCO2 arterial: 57.8 mmHg — ABNORMAL HIGH (ref 32–48)
pH, Arterial: 7.376 (ref 7.35–7.45)
pH, Arterial: 7.419 (ref 7.35–7.45)
pH, Arterial: 7.157 — CL (ref 7.35–7.45)
pH, Arterial: 7.129 — CL (ref 7.35–7.45)
pH, Arterial: 7.106 — CL (ref 7.35–7.45)
pH, Arterial: 7.065 — CL (ref 7.35–7.45)
pH, Arterial: 7.189 — CL (ref 7.35–7.45)
pO2, Arterial: 180 mmHg — ABNORMAL HIGH (ref 83–108)
pO2, Arterial: 83 mmHg (ref 83–108)
pO2, Arterial: 170 mmHg — ABNORMAL HIGH (ref 83–108)
pO2, Arterial: 141 mmHg — ABNORMAL HIGH (ref 83–108)
pO2, Arterial: 170 mmHg — ABNORMAL HIGH (ref 83–108)
pO2, Arterial: 231 mmHg — ABNORMAL HIGH (ref 83–108)
pO2, Arterial: 155 mmHg — ABNORMAL HIGH (ref 83–108)

## 2021-11-12 LAB — COMPREHENSIVE METABOLIC PANEL
ALT: 27 U/L (ref 0–44)
AST: 53 U/L — ABNORMAL HIGH (ref 15–41)
Albumin: 3.2 g/dL — ABNORMAL LOW (ref 3.5–5.0)
Alkaline Phosphatase: 51 U/L (ref 38–126)
Anion gap: 11 (ref 5–15)
BUN: 5 mg/dL — ABNORMAL LOW (ref 6–20)
CO2: 21 mmol/L — ABNORMAL LOW (ref 22–32)
Calcium: 8.7 mg/dL — ABNORMAL LOW (ref 8.9–10.3)
Chloride: 101 mmol/L (ref 98–111)
Creatinine, Ser: 0.81 mg/dL (ref 0.44–1.00)
GFR, Estimated: >60 mL/min (ref >60)
Glucose, Bld: 188 mg/dL — ABNORMAL HIGH (ref 70–99)
Potassium: 4.1 mmol/L (ref 3.5–5.1)
Sodium: 133 mmol/L — ABNORMAL LOW (ref 135–145)
Total Bilirubin: 0.6 mg/dL (ref 0.3–1.2)
Total Protein: 6.3 g/dL — ABNORMAL LOW (ref 6.5–8.1)

## 2021-11-12 LAB — GLUCOSE, CAPILLARY
Glucose-Capillary: 150 mg/dL — ABNORMAL HIGH (ref 70–99)
Glucose-Capillary: 185 mg/dL — ABNORMAL HIGH (ref 70–99)
Glucose-Capillary: 190 mg/dL — ABNORMAL HIGH (ref 70–99)
Glucose-Capillary: 197 mg/dL — ABNORMAL HIGH (ref 70–99)

## 2021-11-12 LAB — RESPIRATORY PANEL BY PCR

## 2021-11-12 LAB — PHOSPHORUS: Phosphorus: 5 mg/dL — ABNORMAL HIGH (ref 2.5–4.6)

## 2021-11-12 LAB — CBC WITH DIFFERENTIAL/PLATELET
Abs Immature Granulocytes: 0.04 10*3/uL (ref 0.00–0.07)
Basophils Absolute: 0 10*3/uL (ref 0.0–0.1)
Basophils Relative: 0 %
Eosinophils Absolute: 0 10*3/uL (ref 0.0–0.5)
Eosinophils Relative: 0 %
HCT: 38 % (ref 36.0–46.0)
Hemoglobin: 12.7 g/dL (ref 12.0–15.0)
Immature Granulocytes: 1 %
Lymphocytes Relative: 5 %
Lymphs Abs: 0.4 10*3/uL — ABNORMAL LOW (ref 0.7–4.0)
MCH: 33.4 pg (ref 26.0–34.0)
MCHC: 33.4 g/dL (ref 30.0–36.0)
MCV: 100 fL (ref 80.0–100.0)
Monocytes Absolute: 0.2 10*3/uL (ref 0.1–1.0)
Monocytes Relative: 3 %
Neutro Abs: 7.3 10*3/uL (ref 1.7–7.7)
Neutrophils Relative %: 91 %
Platelets: 233 10*3/uL (ref 150–400)
RBC: 3.8 MIL/uL — ABNORMAL LOW (ref 3.87–5.11)
RDW: 14.5 % (ref 11.5–15.5)
WBC: 8 10*3/uL (ref 4.0–10.5)
nRBC: 0 % (ref 0.0–0.2)

## 2021-11-12 LAB — MAGNESIUM: Magnesium: 3.1 mg/dL — ABNORMAL HIGH (ref 1.7–2.4)

## 2021-11-12 LAB — C-REACTIVE PROTEIN: CRP: 4.7 mg/dL — ABNORMAL HIGH (ref <1.0)

## 2021-11-12 LAB — VITAMIN B12: Vitamin B-12: 122 pg/mL — ABNORMAL LOW (ref 180–914)

## 2021-11-12 MED ORDER — ORAL CARE MOUTH RINSE
15.0000 mL | OROMUCOSAL | Status: DC
  Administered 2021-11-12 – 2021-11-17 (×53): 15 mL via OROMUCOSAL

## 2021-11-12 MED ORDER — MAGNESIUM SULFATE 2 GM/50ML IV SOLN: 2.0000 g | Freq: Once | INTRAVENOUS | Status: DC

## 2021-11-12 MED ORDER — POTASSIUM CHLORIDE 10 MEQ/100ML IV SOLN
10.0000 meq | INTRAVENOUS | Status: AC
  Administered 2021-11-12 (×5): 10 meq via INTRAVENOUS
  Filled 2021-11-12 (×5): qty 100

## 2021-11-12 MED ORDER — MIDAZOLAM HCL 2 MG/2ML IJ SOLN
INTRAMUSCULAR | Status: AC
  Filled 2021-11-12: qty 2

## 2021-11-12 MED ORDER — FENTANYL BOLUS VIA INFUSION
50.0000 ug | INTRAVENOUS | Status: DC | PRN
  Administered 2021-11-12 – 2021-11-16 (×29): 100 ug via INTRAVENOUS
  Administered 2021-11-16 (×2): 50 ug via INTRAVENOUS
  Administered 2021-11-16: 100 ug via INTRAVENOUS
  Administered 2021-11-17: 50 ug via INTRAVENOUS
  Administered 2021-11-17 (×2): 100 ug via INTRAVENOUS
  Filled 2021-11-12: qty 100

## 2021-11-12 MED ORDER — FUROSEMIDE 10 MG/ML IJ SOLN
20.0000 mg | Freq: Four times a day (QID) | INTRAMUSCULAR | Status: DC
  Filled 2021-11-12: qty 2

## 2021-11-12 MED ORDER — FENTANYL 2500MCG IN NS 250ML (10MCG/ML) PREMIX INFUSION
0.0000 ug/h | INTRAVENOUS | Status: DC
  Administered 2021-11-12 (×3): 400 ug/h via INTRAVENOUS
  Administered 2021-11-12: 50 ug/h via INTRAVENOUS
  Administered 2021-11-13 – 2021-11-15 (×12): 400 ug/h via INTRAVENOUS
  Administered 2021-11-16: 100 ug/h via INTRAVENOUS
  Administered 2021-11-16: 400 ug/h via INTRAVENOUS
  Filled 2021-11-12 (×17): qty 250

## 2021-11-12 MED ORDER — MIDAZOLAM HCL 2 MG/2ML IJ SOLN
2.0000 mg | INTRAMUSCULAR | Status: DC | PRN
  Administered 2021-11-12 (×2): 2 mg via INTRAVENOUS
  Filled 2021-11-12: qty 2

## 2021-11-12 MED ORDER — MAGNESIUM SULFATE 4 GM/100ML IV SOLN
4.0000 g | Freq: Once | INTRAVENOUS | Status: AC
  Administered 2021-11-12: 4 g via INTRAVENOUS
  Filled 2021-11-12: qty 100

## 2021-11-12 MED ORDER — PHENOBARBITAL 32.4 MG PO TABS
64.8000 mg | ORAL_TABLET | Freq: Three times a day (TID) | ORAL | Status: AC
  Administered 2021-11-14 – 2021-11-15 (×6): 64.8 mg
  Filled 2021-11-12 (×6): qty 2

## 2021-11-12 MED ORDER — THIAMINE HCL 100 MG/ML IJ SOLN
500.0000 mg | Freq: Once | INTRAMUSCULAR | Status: AC
  Administered 2021-11-12: 500 mg via INTRAVENOUS
  Filled 2021-11-12: qty 5

## 2021-11-12 MED ORDER — NOREPINEPHRINE 4 MG/250ML-% IV SOLN
INTRAVENOUS | Status: AC
  Filled 2021-11-12: qty 250

## 2021-11-12 MED ORDER — PHENOBARBITAL SODIUM 130 MG/ML IJ SOLN
97.5000 mg | Freq: Three times a day (TID) | INTRAMUSCULAR | Status: AC
  Administered 2021-11-12 – 2021-11-13 (×6): 97.5 mg via INTRAVENOUS
  Filled 2021-11-12 (×7): qty 1

## 2021-11-12 MED ORDER — LACTATED RINGERS IV BOLUS
1000.0000 mL | Freq: Once | INTRAVENOUS | Status: AC
Start: 2021-11-12 — End: 2021-11-12
  Administered 2021-11-12: 1000 mL via INTRAVENOUS

## 2021-11-12 MED ORDER — METHYLPREDNISOLONE SODIUM SUCC 125 MG IJ SOLR
60.0000 mg | Freq: Four times a day (QID) | INTRAMUSCULAR | Status: DC
  Administered 2021-11-12 – 2021-11-16 (×17): 60 mg via INTRAVENOUS
  Filled 2021-11-12 (×16): qty 2

## 2021-11-12 MED ORDER — ALBUTEROL (5 MG/ML) CONTINUOUS INHALATION SOLN
20.0000 mg/h | INHALATION_SOLUTION | RESPIRATORY_TRACT | Status: DC
  Administered 2021-11-12 (×3): 20 mg/h via RESPIRATORY_TRACT
  Filled 2021-11-12 (×3): qty 0.5

## 2021-11-12 MED ORDER — SODIUM CHLORIDE 0.9 % IV SOLN
1.5000 mg/kg/h | INTRAVENOUS | Status: DC
  Administered 2021-11-12 (×2): 1.5 mg/kg/h via INTRAVENOUS
  Administered 2021-11-12: 1 mg/kg/h via INTRAVENOUS
  Administered 2021-11-12 – 2021-11-13 (×4): 1.5 mg/kg/h via INTRAVENOUS
  Filled 2021-11-12 (×11): qty 5

## 2021-11-12 MED ORDER — PHENOBARBITAL 32.4 MG PO TABS
32.4000 mg | ORAL_TABLET | Freq: Three times a day (TID) | ORAL | Status: AC
  Administered 2021-11-16 – 2021-11-17 (×6): 32.4 mg
  Filled 2021-11-12 (×6): qty 1

## 2021-11-12 MED ORDER — ALBUTEROL SULFATE (2.5 MG/3ML) 0.083% IN NEBU
INHALATION_SOLUTION | RESPIRATORY_TRACT | Status: AC
  Filled 2021-11-12: qty 12

## 2021-11-12 MED ORDER — MIDAZOLAM BOLUS VIA INFUSION
0.0000 mg | INTRAVENOUS | Status: DC | PRN
  Administered 2021-11-12 (×2): 2 mg via INTRAVENOUS
  Administered 2021-11-12: 1 mg via INTRAVENOUS
  Administered 2021-11-12 – 2021-11-14 (×6): 2 mg via INTRAVENOUS
  Filled 2021-11-12: qty 5

## 2021-11-12 MED ORDER — VECURONIUM BROMIDE 10 MG IV SOLR
INTRAVENOUS | Status: AC
  Filled 2021-11-12: qty 10

## 2021-11-12 MED ORDER — CHLORHEXIDINE GLUCONATE 0.12% ORAL RINSE (MEDLINE KIT)
15.0000 mL | Freq: Two times a day (BID) | OROMUCOSAL | Status: DC
  Administered 2021-11-12 – 2021-11-17 (×12): 15 mL via OROMUCOSAL

## 2021-11-12 MED ORDER — FUROSEMIDE 10 MG/ML IJ SOLN
20.0000 mg | INTRAMUSCULAR | Status: DC
  Administered 2021-11-12 – 2021-11-13 (×3): 20 mg via INTRAVENOUS
  Filled 2021-11-12 (×2): qty 2

## 2021-11-12 MED ORDER — ROCURONIUM BROMIDE 10 MG/ML (PF) SYRINGE
80.0000 mg | PREFILLED_SYRINGE | Freq: Once | INTRAVENOUS | Status: AC
  Administered 2021-11-12: 80 mg via INTRAVENOUS

## 2021-11-12 MED ORDER — MIDAZOLAM-SODIUM CHLORIDE 100-0.9 MG/100ML-% IV SOLN
0.0000 mg/h | INTRAVENOUS | Status: DC
  Administered 2021-11-12 (×2): 10 mg/h via INTRAVENOUS
  Administered 2021-11-12: 2 mg/h via INTRAVENOUS
  Administered 2021-11-13 – 2021-11-14 (×3): 10 mg/h via INTRAVENOUS
  Filled 2021-11-12 (×6): qty 100

## 2021-11-12 MED ORDER — LACTATED RINGERS IV SOLN: INTRAVENOUS | Status: DC

## 2021-11-12 MED ORDER — FENTANYL CITRATE (PF) 100 MCG/2ML IJ SOLN: 50.0000 ug | Freq: Once | INTRAMUSCULAR | Status: DC

## 2021-11-12 MED ORDER — MIDAZOLAM HCL 2 MG/2ML IJ SOLN
2.0000 mg | Freq: Once | INTRAMUSCULAR | Status: AC
  Administered 2021-11-12: 2 mg via INTRAVENOUS

## 2021-11-12 MED ORDER — FENTANYL 2500MCG IN NS 250ML (10MCG/ML) PREMIX INFUSION
INTRAVENOUS | Status: AC
  Filled 2021-11-12: qty 250

## 2021-11-12 MED ORDER — POTASSIUM CHLORIDE 10 MEQ/100ML IV SOLN
10.0000 meq | INTRAVENOUS | Status: AC
  Administered 2021-11-12 – 2021-11-13 (×4): 10 meq via INTRAVENOUS
  Filled 2021-11-12 (×4): qty 100

## 2021-11-12 MED ORDER — LORAZEPAM 2 MG/ML IJ SOLN
1.0000 mg | INTRAMUSCULAR | Status: DC | PRN
  Administered 2021-11-15 – 2021-11-16 (×3): 1 mg via INTRAVENOUS
  Filled 2021-11-12 (×5): qty 1

## 2021-11-12 MED ORDER — VECURONIUM BROMIDE 10 MG IV SOLR
10.0000 mg | INTRAVENOUS | Status: DC | PRN
  Administered 2021-11-12 – 2021-11-14 (×6): 10 mg via INTRAVENOUS
  Filled 2021-11-12 (×5): qty 10

## 2021-11-12 MED ORDER — SODIUM BICARBONATE 8.4 % IV SOLN
100.0000 meq | Freq: Once | INTRAVENOUS | Status: AC
  Administered 2021-11-12: 100 meq via INTRAVENOUS
  Filled 2021-11-12: qty 50

## 2021-11-12 MED ORDER — NOREPINEPHRINE 4 MG/250ML-% IV SOLN
2.0000 ug/min | INTRAVENOUS | Status: DC
  Administered 2021-11-12: 2 ug/min via INTRAVENOUS

## 2021-11-12 MED ORDER — PROSOURCE TF PO LIQD
45.0000 mL | Freq: Two times a day (BID) | ORAL | Status: DC
  Administered 2021-11-12 – 2021-11-17 (×11): 45 mL
  Filled 2021-11-12 (×11): qty 45

## 2021-11-12 MED ORDER — ETOMIDATE 2 MG/ML IV SOLN
20.0000 mg | Freq: Once | INTRAVENOUS | Status: AC
  Administered 2021-11-12: 20 mg via INTRAVENOUS

## 2021-11-12 MED ORDER — SODIUM CHLORIDE 0.9 % IV SOLN
250.0000 mL | INTRAVENOUS | Status: DC
  Administered 2021-11-12: 250 mL via INTRAVENOUS

## 2021-11-12 MED ORDER — ALBUTEROL (5 MG/ML) CONTINUOUS INHALATION SOLN
10.0000 mg/h | INHALATION_SOLUTION | RESPIRATORY_TRACT | Status: DC
  Administered 2021-11-12 – 2021-11-13 (×4): 15 mg/h via RESPIRATORY_TRACT
  Filled 2021-11-12 (×2): qty 0.5

## 2021-11-12 MED ORDER — SODIUM CHLORIDE 0.9 % IV SOLN
3.0000 g | Freq: Four times a day (QID) | INTRAVENOUS | Status: AC
  Administered 2021-11-12 – 2021-11-16 (×19): 3 g via INTRAVENOUS
  Filled 2021-11-12 (×18): qty 8

## 2021-11-12 MED ORDER — FAMOTIDINE IN NACL 20-0.9 MG/50ML-% IV SOLN
20.0000 mg | Freq: Two times a day (BID) | INTRAVENOUS | Status: DC
  Administered 2021-11-12 (×3): 20 mg via INTRAVENOUS
  Filled 2021-11-12 (×4): qty 50

## 2021-11-12 MED ORDER — VITAL 1.5 CAL PO LIQD
1000.0000 mL | ORAL | Status: DC
  Administered 2021-11-12 – 2021-11-17 (×7): 1000 mL
  Filled 2021-11-12 (×2): qty 1000

## 2021-11-12 MED ORDER — VECURONIUM BROMIDE 10 MG IV SOLR
10.0000 mg | Freq: Once | INTRAVENOUS | Status: AC
  Administered 2021-11-12: 10 mg via INTRAVENOUS

## 2021-11-12 NOTE — Procedures (Signed)
Intubation Procedure Note  Kara Haynes  [id number]  October 16, 1990  Date:11/12/21  Time:12:14 AM   Provider Performing:Caylon Saine L Nancie Neas    Procedure: Intubation ([id number])  Indication(s) Respiratory Failure  Consent Risks of the procedure as well as the alternatives and risks of each were explained to the patient and/or caregiver.  Consent for the procedure was obtained and is signed in the bedside chart   Anesthesia Etomidate and Rocuronium   Time Out Verified patient identification, verified procedure, site/side was marked, verified correct patient position, special equipment/implants available, medications/allergies/relevant history reviewed, required imaging and test results available.  Sterile Technique Usual hand hygeine, masks, and gloves were used  Procedure Description Patient positioned in bed supine.  Sedation given as noted above.  Patient was intubated with endotracheal tube using Glidescope.  View was Grade 1 full glottis .  Number of attempts was 1.  Colorimetric CO2 detector was consistent with tracheal placement.   Complications/Tolerance None; patient tolerated the procedure well. Chest X-ray is ordered to verify placement.   EBL Minimal   Specimen(s) None

## 2021-11-12 NOTE — Progress Notes (Signed)
RT NOTE:  Pt's head turned to right and arms rotated. ETT secure.

## 2021-11-12 NOTE — Progress Notes (Signed)
31 year old female with severe asthma, not on maintenance therapy at home, alcohol abuse and GERD, hepatic steatosis, who presented with worsening shortness of breath and cough  Patient was on BiPAP for more than 12 hours, overnight she became tachypneic, tachycardic and hypoxic requiring endotracheal intubation  This AM, patient's peak pressures were in 50s, with auto peeping plateau pressure in the high 40s. Subsequently paralyzed, proned. Hypercarbic due to stiff lungs, difficulty ventilating.    E-link called for diuresis.   Plans: - keeps lungs as dry as possible, as much as renal function and BP will tolerate -Lasix naive, will start 20mg  IV q4-6 hrs, increase per day team -Lasix infusion to a goal set output or daily negative balance may be preferable -Lasix 40mg  IV in anticipation of drop  -Ketamine + paralytic as needed  -Continue continuous albuterol inhalation -theophylline/aminophylline may be an option -ECMO support is being considered -Flolan inhaled may be of benefit if thought to be in progressive ARDS/ALI

## 2021-11-12 NOTE — Progress Notes (Addendum)
RT NOTE:  CAT refilled. Head turned to left and arms rotated. ETT secure.   ABG collected to check progress post prone. No changes made at this time.    Latest Reference Range & Units 11/12/21 19:53  Sample type  ARTERIAL  pH, Arterial 7.35 - 7.45  7.189 (LL)  pCO2 arterial 32 - 48 mmHg 57.8 (H)  pO2, Arterial 83 - 108 mmHg 155 (H)  TCO2 22 - 32 mmol/L 24  Acid-base deficit 0.0 - 2.0 mmol/L 7.0 (H)  Bicarbonate 20.0 - 28.0 mmol/L 22.2  O2 Saturation % 99  Patient temp  97.7 F  Collection site  art line

## 2021-11-12 NOTE — Progress Notes (Signed)
Peaking T wave: contact elink, obtained ekg. Provider made aware

## 2021-11-12 NOTE — Progress Notes (Signed)
Follow up ABG obtained on ventilator settings of VT: 380, RR: 26, FIO2: 60%, and PEEP: 8.0.  Results given to MD and instructed to increase VT to 400 and obtain ABG after one hour.     Latest Reference Range & Units 11/12/21 11:05  Sample type  ARTERIAL  pH, Arterial 7.35 - 7.45  7.129 (LL)  pCO2 arterial 32 - 48 mmHg 70.3 (HH)  pO2, Arterial 83 - 108 mmHg 141 (H)  TCO2 22 - 32 mmol/L 25  Acid-base deficit 0.0 - 2.0 mmol/L 7.0 (H)  Bicarbonate 20.0 - 28.0 mmol/L 23.4  O2 Saturation % 98  Patient temperature  98.4 F  Collection site  art line

## 2021-11-12 NOTE — Progress Notes (Addendum)
Initial Nutrition Assessment  DOCUMENTATION CODES:   Not applicable  INTERVENTION:   - Recommend exchanging OG tube for Cortrak tomorrow, 11/13/21  Initiate tube feeds via OG tube: - Start Vital 1.5 @ 20 ml/hr and advance by 10 ml q 8 hours to goal rate of 60 ml/hr (1440 ml/day) - ProSource TF 45 ml BID  Tube feeding regimen at goal rate provides 2240 kcal, 119 grams of protein, and 1100 ml of H2O.  Monitor magnesium, potassium, and phosphorus BID for at least 3 days, MD to replete as needed, as pt is at risk for refeeding syndrome given EtOH abuse.  - Continue MVI with minerals, folic acid, and thiamine in the setting of EtOH abuse  - Checking vitamin B-6, vitamin B-12, vitamin C, and zinc labs  NUTRITION DIAGNOSIS:   Inadequate oral intake related to inability to eat as evidenced by NPO status.  GOAL:   Patient will meet greater than or equal to 90% of their needs  MONITOR:   Vent status, Labs, Weight trends, TF tolerance, I & O's  REASON FOR ASSESSMENT:   Ventilator, Consult Enteral/tube feeding initiation and management  ASSESSMENT:   31 year old female who presented to the ED on 2/26 with respiratory distress and chest pain. PMH of asthma, GERD, EtOH abuse, hepatic steatosis, pulmonary embolism, pelvic adhesive disease, retroperitoneal fibrosis, endometriosis. Pt admitted with asthma exacerbation.  02/28 - intubated, proned  Discussed pt with RN and during ICU rounds. Consult received for tube feeding initiation and management. Pt with OG tube in stomach per abdominal x-ray. Pt may require ECMO support. Pt proned today.  Unable to obtain diet and weight history at this time. Per notes, pt drinks 12 shots of liquor daily.  Reviewed weight history in chart. Weight on admission of 81.6 kg (180 lbs) appears stated rather than measured. If accurate, pt has experienced a 8.2 kg weight loss since 06/09/21. This is a 9.1% weight loss in 5 months which is not quite  significant for timeframe but is concerning.  MD okay with checking vitamin/mineral labs to assess for deficiency.  Patient is currently intubated on ventilator support MV: 10.4 L/min Temp (24hrs), Avg:98.9 F (37.2 C), Min:97.6 F (36.4 C), Max:102.4 F (39.1 C) BP (a-line): 115/52 MAP (a-line): 71  Drips: Fentanyl Ketamine Versed Levophed LR: 125 ml/hr  Medications reviewed and include: colace, folic acid, IV solu-medrol, MVI with minerals daily, phenobarbital taper, miralax, thiamine, IV abx, IV pepcid 12 mg q 12 hours, IV KCl 10 mEq x 5 runs  Vitamin/Mineral Profile: Vitamin B12: pending Vitamin B6: pending Vitamin C: pending Zinc: pending CRP: pending  Labs reviewed: sodium 134, potassium 3.3 CBG's: 150-197  UOP: 450 ml x 12 hours I/O's: +2.3 L since admit  NUTRITION - FOCUSED PHYSICAL EXAM:  Flowsheet Row Most Recent Value  Orbital Region No depletion  Upper Arm Region No depletion  Thoracic and Lumbar Region No depletion  Buccal Region Unable to assess  Temple Region No depletion  Clavicle Bone Region Mild depletion  Clavicle and Acromion Bone Region Mild depletion  Scapular Bone Region No depletion  Dorsal Hand No depletion  Patellar Region No depletion  Anterior Thigh Region No depletion  Posterior Calf Region Mild depletion  Edema (RD Assessment) None  Hair Reviewed  Eyes Unable to assess  Mouth Unable to assess  Skin Reviewed  Nails Reviewed       Diet Order:   Diet Order             Diet NPO  time specified  Diet effective now                   EDUCATION NEEDS:   Not appropriate for education at this time  Skin:  Skin Assessment: Reviewed RN Assessment  Last BM:  no documented BM  Height:   Ht Readings from Last 1 Encounters:  11/10/21 5\' 2"  (1.575 m)    Weight:   Wt Readings from Last 1 Encounters:  11/10/21 81.6 kg    BMI:  Body mass index is 32.92 kg/m.  Estimated Nutritional Needs:   Kcal:   2100-2300  Protein:  105-120 grams  Fluid:  >/= 2.0 L    Gustavus Bryant, MS, RD, LDN Inpatient Clinical Dietitian Please see AMiON for contact information.

## 2021-11-12 NOTE — Progress Notes (Addendum)
NAME:  Kara Haynes, MRN:  [id number], DOB:  [birth year], LOS: 1 ADMISSION DATE:  11/10/2021, CONSULTATION DATE:  11/11/2021 REFERRING MD:  Dr. Alvino Chapel, CHIEF COMPLAINT:  Asthma Exacerbation    History of Present Illness:  Kara Haynes is a 31 y.o. female with a PMH significant for Asthma, Prior PE in 2015, GERD, obesity, and alcohol abuse who presented to the ED for  respiratory distress felt secondary to an asthma exacerbation that started 3 days prior to admission. Patient attempted to use home rescue inhaler with no relief in dyspnea. Of note patient was seen at [facility] ED for similar symptoms early this am but was felt safe to discharge from ED.  On ED presentation patient was seen ill appearing with fever, tachycardia, and tachypnea.  Concern for acute asthma exacerbation possibly next in the setting of viral illness.  Lab work significant for glucose 148, albumin 3.3, lactic acid 3.0 with downtrend to 1.8.  Given persistent tachycardia tachypnea and severe bronchospasm patient was placed on BiPAP and critical care/pulmonary was consulted.  Pertinent  Medical History  Asthma Prior PE in 2015 GERD Alcohol abuse  Significant Hospital Events: Including procedures, antibiotic start and stop dates in addition to other pertinent events   2/27 presented to [facility], ED for complaints of chest tightness and dyspnea found to be in severe asthma exacerbation 2/28 intubated overnight, acutely hypoxic and hypercarbic this AM, sedated and paralyzed  Interim History / Subjective:  Worsening overnight with RR in the 50's and intubated, hypoxic and hypercapnic on the vent this AM  Objective   Blood pressure 110/80, pulse 64, temperature 98.4 F (36.9 C), temperature source Axillary, resp. rate (!) 35, height 5\' 2"  (1.575 m), weight 81.6 kg, last menstrual period 11/07/2021, SpO2 98 %.    Vent Mode: PRVC FiO2 (%):  [40 %-60 %] 40 % Set Rate:  [30 bmp-35 bmp] 35 bmp Vt Set:   [400 mL] 400 mL PEEP:  [4 cmH20-8 cmH20] 5 cmH20 Pressure Support:  [6 cmH20] 6 cmH20 Plateau Pressure:  [27 cmH20-30 cmH20] 30 cmH20   Intake/Output Summary (Last 24 hours) at 11/12/2021 11/14/2021 Last data filed at 11/12/2021 0600 Gross per 24 hour  Intake 460.98 ml  Output 450 ml  Net 10.98 ml    Filed Weights   11/10/21 1546  Weight: 81.6 kg     General:  critically ill-appearing F intubated and sedated HEENT: MM pink/moist, pupils equal and sclera anicteric  Neuro: opened eyes to voice, not otherwise responsive  CV: s1s2 tachycardic, regular, no m/r/g PULM:  severe bronchospasm with minimal air movement throughout with elevated plateau and pepp GI: soft, bsx4 active  Extremities: warm/dry, no edema  Skin: no rashes or lesions      Resolved Hospital Problem list     Assessment & Plan:     Severe asthma exacerbation with acute hypoxic respiratory failure  -At baseline patient utilizes rescue albuterol only at baseline -CT chest unremarkable  P: -decompensated overnight requiring intubation, deep sedation and one dose paralytic this AM -on Ketamine, Fentanyl and versed gtts -despite vent changes, continuous neb, magnesium and increased solumedrol respiratory acidosis not improving, plan to prone, may need ECMO consult -continue steroids, bronchodilators -follow cultures and RVP -CXR with patchy opacities, start Unasyn, trend procal   Alcohol abuse -Patient states she takes about 12 shots per day with estimated last alcoholic consumption 2/25 P: -started on phenobarbital, continue CIWA scale -Supplement thiamine, folic acid, and multivitamin  Mild hyperglycemia P: Monitor  CBG for worsening hyperglycemia in setting of steroid use CBG goal 1 40-1 80  Hyponatremia Appears chronic based on previous labs, Na 133  Best Practice (right click and "Reselect all SmartList Selections" daily)   Diet/type: tubefeeds DVT prophylaxis: LMWH GI prophylaxis: PPI Lines:  N/A Foley:  N/A Code Status:  full code Last date of multidisciplinary goals of care discussion: family updated at the bedside  Labs   CBC: Recent Labs  Lab 11/10/21 1517 11/11/21 0453 11/11/21 2036 11/12/21 0122 11/12/21 0135 11/12/21 0345  WBC 6.0 6.9  --   --  8.0  --   NEUTROABS 5.6  --   --   --  7.3  --   HGB 13.9 12.3 12.9 13.3 12.7 12.9  HCT 40.8 34.5* 38.0 39.0 38.0 38.0  MCV 99.0 97.7  --   --  100.0  --   PLT 273 244  --   --  233  --      Basic Metabolic Panel: Recent Labs  Lab 11/10/21 1517 11/10/21 2042 11/11/21 0453 11/11/21 2036 11/12/21 0122 11/12/21 0135 11/12/21 0345  NA 134*  --  136 134* 133* 133* 134*  K 3.8  --  3.8 4.1 4.2 4.1 4.4  CL 101  --  107  --   --  101  --   CO2 20*  --  22  --   --  21*  --   GLUCOSE 178*  --  148*  --   --  188*  --   BUN <5*  --  <5*  --   --  5*  --   CREATININE 0.89  --  0.71  --   --  0.81  --   CALCIUM 9.2  --  8.4*  --   --  8.7*  --   MG  --  1.3* 2.1  --   --   --   --   PHOS  --  2.6  --   --   --   --   --     GFR: Estimated Creatinine Clearance: 99.6 mL/min (by C-G formula based on SCr of 0.81 mg/dL). Recent Labs  Lab 11/10/21 1517 11/10/21 1802 11/10/21 2042 11/10/21 2305 11/11/21 0453 11/12/21 0135  PROCALCITON  --   --  <0.10  --  <0.10  --   WBC 6.0  --   --   --  6.9 8.0  LATICACIDVEN 3.4* 3.0*  --  1.8  --   --      Liver Function Tests: Recent Labs  Lab 11/10/21 1517 11/11/21 0453 11/12/21 0135  AST 59* 35 53*  ALT 28 21 27   ALKPHOS 65 47 51  BILITOT 0.7 0.6 0.6  PROT 7.2 5.9* 6.3*  ALBUMIN 3.9 3.3* 3.2*    No results for input(s): LIPASE, AMYLASE in the last 168 hours. No results for input(s): AMMONIA in the last 168 hours.  ABG    Component Value Date/Time   PHART 7.419 11/12/2021 0345   PCO2ART 37.2 11/12/2021 0345   PO2ART 83 11/12/2021 0345   HCO3 24.0 11/12/2021 0345   TCO2 25 11/12/2021 0345   ACIDBASEDEF 1.0 11/12/2021 0122   O2SAT 96 11/12/2021 0345       Coagulation Profile: Recent Labs  Lab 11/10/21 1530  INR 0.9     Cardiac Enzymes: No results for input(s): CKTOTAL, CKMB, CKMBINDEX, TROPONINI in the last 168 hours.  HbA1C: Hgb A1c MFr Bld  Date/Time Value Ref Range Status  08/10/2014 01:00 AM 4.6 <5.7 % Final    Comment:    (NOTE)                                                                       According to the ADA Clinical Practice Recommendations for 2011, when HbA1c is used as a screening test:  >=6.5%   Diagnostic of Diabetes Mellitus           (if abnormal result is confirmed) 5.7-6.4%   Increased risk of developing Diabetes Mellitus References:Diagnosis and Classification of Diabetes Mellitus,Diabetes Care,2011,34(Suppl 1):S62-S69 and Standards of Medical Care in         Diabetes - 2011,Diabetes Care,2011,34 (Suppl 1):S11-S61.     CBG: Recent Labs  Lab 11/12/21 0332 11/12/21 0739  GLUCAP 197* 150*    Review of Systems:   Please see the history of present illness. All other systems reviewed and are negative   Past Medical History:  She,  has a past medical history of Alcohol abuse, Allergic rhinitis, seasonal, Dyspnea, Eczema, GERD (gastroesophageal reflux disease), History of pulmonary embolus (PE) (08/09/2014), History of trichomoniasis, Left ovarian cyst, Mild intermittent asthma, Obesity (BMI 30-39.9), Pelvic adhesive disease, and Pneumonia.   Surgical History:   Past Surgical History:  Procedure Laterality Date   ABDOMINAL HYSTERECTOMY     LAPAROSCOPIC OVARIAN CYSTECTOMY Left 06/05/2020   Procedure: LAPAROSCOPIC OVARIAN CYSTECTOMY;  Surgeon: Winthrop Bing, MD;  Location: [facility] [place];  Service: Gynecology;  Laterality: Left;   NO PAST SURGERIES     ROBOTIC ASSISTED TOTAL HYSTERECTOMY WITH BILATERAL SALPINGO OOPHERECTOMY N/A 07/03/2020   Procedure: XI ROBOTIC ASSISTED  LEFT  SALPINGO OOPHORECTOMY/ LAPAROSCOPIC LYSIS OF ADHESIONS/LEFT URETEROLYSIS;  Surgeon: Adolphus Birchwood, MD;   Location: [facility];  Service: Gynecology;  Laterality: N/A;     Social History:   reports that she has been smoking cigarettes. She has a 6.00 pack-year smoking history. She has never used smokeless tobacco. She reports current alcohol use. She reports current drug use. Drug: Marijuana.   Family History:  Her family history includes Asthma in her maternal grandfather; Healthy in her father and mother; Hypertension in her maternal grandfather; Other in an other family member.   Allergies Allergies  Allergen Reactions   Fish Allergy Hives, Shortness Of Breath and Swelling    Mostly tilapia     Home Medications  Prior to Admission medications   Medication Sig Start Date End Date Taking? Authorizing Provider  acetaminophen (TYLENOL) 500 MG tablet Take 500 mg by mouth every 6 (six) hours as needed for mild pain or moderate pain.   Yes [provider]  albuterol (VENTOLIN HFA) 108 (90 Base) MCG/ACT inhaler Inhale 2 puffs into the lungs every 6 (six) hours as needed for wheezing or shortness of breath.    Yes [provider]  ipratropium (ATROVENT HFA) 17 MCG/ACT inhaler Inhale 2 puffs into the lungs every 4 (four) hours as needed for wheezing.   Yes [provider]  cetirizine (ZYRTEC ALLERGY) 10 MG tablet Take 1 tablet (10 mg total) by mouth at bedtime. Patient not taking: Reported on 11/10/2021 06/09/21   Gailen Shelter, PA  fluticasone (FLOVENT HFA) 44 MCG/ACT inhaler Inhale 1 puff into the lungs daily. Patient not  taking: Reported on 11/10/2021 06/09/21   Gailen ShelterFondaw, Wylder S, PA  pantoprazole (PROTONIX) 40 MG tablet Take 1 tablet (40 mg total) by mouth daily. Patient not taking: Reported on 05/27/2021 11/02/20   Sudie GrumblingAmyot, Ann Berry, NP  predniSONE (DELTASONE) 20 MG tablet Take 3 tablets (60 mg total) by mouth daily for 5 days. 11/10/21 11/15/21  Virgina Norfolkuratolo, Adam, DO     Critical care time: 45min  CRITICAL CARE Performed by: Darcella GasmanLaura R Taelyr Jantz  Total critical care time: 45  minutes  Critical care time was exclusive of separately billable procedures and treating other patients.  Critical care was necessary to treat or prevent imminent or life-threatening deterioration.  Critical care was time spent personally by me on the following activities: development of treatment plan with patient and/or surrogate as well as nursing, discussions with consultants, evaluation of patient's response to treatment, examination of patient, obtaining history from patient or surrogate, ordering and performing treatments and interventions, ordering and review of laboratory studies, ordering and review of radiographic studies, pulse oximetry and re-evaluation of patient's condition.      Darcella GasmanLaura R Pierre Dellarocco, PA-C [facility] See Amion for pager If no response to pager , please call [phone] until 7pm After 7:00 pm call Elink  [phone]

## 2021-11-12 NOTE — Progress Notes (Addendum)
Called to bedside for worsening resp status. Pt HTN, tachycardic 150-170s, tachypnic to 50s on bipap., using accessory muscles. Poor air movement throughout   D/w patient, GF and RN at bedside - patient initially wanting to refuse intubation. Discussed risks/benefits including risk of death and pt ultimately agreeable .   Intubated without issue - see procedure note. Secured 7.5 ETT in place, 23 cm at lip.   Acute resp failure in setting of Status asthmaticus, Etoh withdrawal  - initial plan for ketamine post intubation but with SYS > 200 and HR >160 & good waveform on vent post intubation, while paralyzed, held for now  - start dex, fentanyl - PRN versed given heavy Etoh hx   - continuous nebs - ABG in 1 hr & re assess need for ketamine  PCCM Time: 35 min separate of procedure   Alvino Blood MD  PCCM

## 2021-11-12 NOTE — Progress Notes (Signed)
Upon arrival to patient room this AM for AM assessment, RT noted that patient's peak pressures were elevated to high 40s and patient's VT was reading in the low 300s (set at 400).  No cuff leak noted.  Breath sounds auscultated and noted to be diminished with faint expiratory wheeze.  Gave patient scheduled nebulizer this AM.  RT checked auto-peep noted to be in the 30s.  RT attempted to try to give patient prolonged exhalation to help with auto-peep.  Patient's VT then dropped to 100s, peak pressures went to the 50s, and sats began to drop to the 60s.  RT then removed patient from ventilator and began to bag patient while calling MD to patient room.  Sats improved back to 98% and patient was placed back on ventilator with MD present and patient noted to not be getting any volumes.  20mg  continuous Albuterol treatment ordered for patient.  Patient given 5mg  Albuterol and one time dose of a paralytic and sedation meds while waiting for equipment.  Patient's RR was decreased by MD once placed back on ventilator from 35 to 24.  Aline was placed by resident and ABG was obtained (results below).   Patient RR increased to 26 based on ABG results.  Peak pressures have now improved to the 30s.  Patient also noted to be moving better air with breath sounds.  Will continue to monitor and make adjustments as tolerated.      Latest Reference Range & Units 11/12/21 09:50  Sample type  ARTERIAL  pH, Arterial 7.35 - 7.45  7.157 (LL)  pCO2 arterial 32 - 48 mmHg 72.8 (HH)  pO2, Arterial 83 - 108 mmHg 170 (H)  TCO2 22 - 32 mmol/L 28  Acid-base deficit 0.0 - 2.0 mmol/L 5.0 (H)  Bicarbonate 20.0 - 28.0 mmol/L 25.8  O2 Saturation % 99  Patient temperature  98.4 F  Collection site  art line

## 2021-11-12 NOTE — Progress Notes (Signed)
Notified nurse to place the iWatch to IV site to monitor for infiltration. X to mark the tip. Nurse VU. Tomasita Morrow, NR VAST

## 2021-11-12 NOTE — Procedures (Signed)
Arterial Catheter Insertion Procedure Note  VERITY GILCREST  [id number]  31-Oct-1990  Date:11/12/21  Time:10:12 AM    Provider Performing: Verdene Lennert    Procedure: Insertion of Arterial Line (00938) with US guidance (18299)   Indication(s) Blood pressure monitoring and/or need for frequent ABGs  Consent Unable to obtain consent due to emergent nature of procedure.  Anesthesia None   Time Out Verified patient identification, verified procedure, site/side was marked, verified correct patient position, special equipment/implants available, medications/allergies/relevant history reviewed, required imaging and test results available.   Sterile Technique Maximal sterile technique including full sterile barrier drape, hand hygiene, sterile gown, sterile gloves, mask, hair covering, sterile ultrasound probe cover (if used).   Procedure Description Area of catheter insertion was cleaned with chlorhexidine and draped in sterile fashion. With real-time ultrasound guidance an arterial catheter was placed into the right  axillary  artery.  Appropriate arterial tracings confirmed on monitor.     Complications/Tolerance None; patient tolerated the procedure well.   EBL Minimal   Specimen(s) None

## 2021-11-12 NOTE — Progress Notes (Signed)
Follow up ABG obtained on ventilator settings of VT: 400, RR: 26, FIO2: 60%, PEEP: 8.0.  Decision was made to prone patient.  Patient was proned without complications.  Will obtain ABG in around an hour.    Latest Reference Range & Units 11/12/21 12:24  Sample type  ARTERIAL  pH, Arterial 7.35 - 7.45  7.106 (LL)  pCO2 arterial 32 - 48 mmHg 67.6 (HH)  pO2, Arterial 83 - 108 mmHg 170 (H)  TCO2 22 - 32 mmol/L 24  Acid-base deficit 0.0 - 2.0 mmol/L 9.0 (H)  Bicarbonate 20.0 - 28.0 mmol/L 21.4  O2 Saturation % 99  Patient temperature  97.7 F  Collection site  art line  (

## 2021-11-12 NOTE — Progress Notes (Signed)
One hour, post prone, ABG obtained on patient.  Results given to MD.  Increased VT to 450.  Decreased FIO2 to 50%.  Bicarb ordered for patient.  No other changes at this time.   Latest Reference Range & Units 11/12/21 14:36  Sample type  ARTERIAL  pH, Arterial 7.35 - 7.45  7.065 (LL)  pCO2 arterial 32 - 48 mmHg 71.3 (HH)  pO2, Arterial 83 - 108 mmHg 231 (H)  TCO2 22 - 32 mmol/L 23  Acid-base deficit 0.0 - 2.0 mmol/L 11.0 (H)  Bicarbonate 20.0 - 28.0 mmol/L 20.6  O2 Saturation % 99  Patient temperature  97.7 F  Collection site  art line

## 2021-11-12 NOTE — Progress Notes (Signed)
Pharmacy Phenobarbital Consult Note   Pharmacy Consult for IV Phenobarbital   Indication: Alcohol Withdrawal    Labs:  Lab Results  Component Value Date   CREATININE 0.81 11/12/2021   AST 53 (H) 11/12/2021   ALT 27 11/12/2021   ALKPHOS 51 11/12/2021    Nursing Bedside Screening:   AUDIT-C:12  PAWSS: patient currently intubated Last known drink: 2/25 Bedside assessment completed by Nena Polio on 2/28   Assessment: Kara Haynes is a 31 y.o. year old female admitted on 11/10/2021. Patients meets criteria for high risk dosing of phenobarbital. Pharmacy consulted to dose phenobarbital.    Plan: Start high risk taper IV: Phenobarbital 97.5mg  IV push q 8h x 6 doses followed by Phenobarbital 65mg  IV push q 8h x 6 doses followed by Phenobarbital 32.4mg  IV push q 8h x 6 doses Start Lorazepam 1mg  IV q4h prn agitation     Thank you for allowing pharmacy to participate in this patient's care.  Lecretia Buczek E Theodore Virgin 11/12/2021,10:59 AM

## 2021-11-13 ENCOUNTER — Inpatient Hospital Stay (HOSPITAL_COMMUNITY): Payer: Self-pay

## 2021-11-13 DIAGNOSIS — J9602 Acute respiratory failure with hypercapnia: Secondary | ICD-10-CM

## 2021-11-13 LAB — CBC
HCT: 35.1 % — ABNORMAL LOW (ref 36.0–46.0)
Hemoglobin: 11.6 g/dL — ABNORMAL LOW (ref 12.0–15.0)
MCH: 33.8 pg (ref 26.0–34.0)
MCHC: 33 g/dL (ref 30.0–36.0)
MCV: 102.3 fL — ABNORMAL HIGH (ref 80.0–100.0)
Platelets: 253 10*3/uL (ref 150–400)
RBC: 3.43 MIL/uL — ABNORMAL LOW (ref 3.87–5.11)
RDW: 15.3 % (ref 11.5–15.5)
WBC: 9.4 10*3/uL (ref 4.0–10.5)
nRBC: 0 % (ref 0.0–0.2)

## 2021-11-13 LAB — POCT I-STAT 7, (LYTES, BLD GAS, ICA,H+H)
Acid-Base Excess: 2 mmol/L (ref 0.0–2.0)
Acid-Base Excess: 2 mmol/L (ref 0.0–2.0)
Acid-Base Excess: 4 mmol/L — ABNORMAL HIGH (ref 0.0–2.0)
Bicarbonate: 29.7 mmol/L — ABNORMAL HIGH (ref 20.0–28.0)
Bicarbonate: 30.4 mmol/L — ABNORMAL HIGH (ref 20.0–28.0)
Bicarbonate: 33.4 mmol/L — ABNORMAL HIGH (ref 20.0–28.0)
Calcium, Ion: 1.24 mmol/L (ref 1.15–1.40)
Calcium, Ion: 1.2 mmol/L (ref 1.15–1.40)
Calcium, Ion: 1.25 mmol/L (ref 1.15–1.40)
HCT: 35 % — ABNORMAL LOW (ref 36.0–46.0)
HCT: 36 % (ref 36.0–46.0)
HCT: 35 % — ABNORMAL LOW (ref 36.0–46.0)
Hemoglobin: 11.9 g/dL — ABNORMAL LOW (ref 12.0–15.0)
Hemoglobin: 12.2 g/dL (ref 12.0–15.0)
Hemoglobin: 11.9 g/dL — ABNORMAL LOW (ref 12.0–15.0)
O2 Saturation: 100 %
O2 Saturation: 99 %
O2 Saturation: 93 %
Patient temperature: 97.8
Patient temperature: 96.9
Patient temperature: 98.7
Potassium: 3.6 mmol/L (ref 3.5–5.1)
Potassium: 4.3 mmol/L (ref 3.5–5.1)
Potassium: 4.9 mmol/L (ref 3.5–5.1)
Sodium: 140 mmol/L (ref 135–145)
Sodium: 139 mmol/L (ref 135–145)
Sodium: 138 mmol/L (ref 135–145)
TCO2: 31 mmol/L (ref 22–32)
TCO2: 32 mmol/L (ref 22–32)
TCO2: 36 mmol/L — ABNORMAL HIGH (ref 22–32)
pCO2 arterial: 58.4 mmHg — ABNORMAL HIGH (ref 32–48)
pCO2 arterial: 60 mmHg — ABNORMAL HIGH (ref 32–48)
pCO2 arterial: 74.6 mmHg (ref 32–48)
pH, Arterial: 7.312 — ABNORMAL LOW (ref 7.35–7.45)
pH, Arterial: 7.308 — ABNORMAL LOW (ref 7.35–7.45)
pH, Arterial: 7.259 — ABNORMAL LOW (ref 7.35–7.45)
pO2, Arterial: 204 mmHg — ABNORMAL HIGH (ref 83–108)
pO2, Arterial: 142 mmHg — ABNORMAL HIGH (ref 83–108)
pO2, Arterial: 81 mmHg — ABNORMAL LOW (ref 83–108)

## 2021-11-13 LAB — PHOSPHORUS
Phosphorus: 1.9 mg/dL — ABNORMAL LOW (ref 2.5–4.6)
Phosphorus: 4.1 mg/dL (ref 2.5–4.6)

## 2021-11-13 LAB — GLUCOSE, CAPILLARY
Glucose-Capillary: 169 mg/dL — ABNORMAL HIGH (ref 70–99)
Glucose-Capillary: 187 mg/dL — ABNORMAL HIGH (ref 70–99)
Glucose-Capillary: 156 mg/dL — ABNORMAL HIGH (ref 70–99)
Glucose-Capillary: 176 mg/dL — ABNORMAL HIGH (ref 70–99)
Glucose-Capillary: 203 mg/dL — ABNORMAL HIGH (ref 70–99)
Glucose-Capillary: 145 mg/dL — ABNORMAL HIGH (ref 70–99)

## 2021-11-13 LAB — MAGNESIUM
Magnesium: 2.5 mg/dL — ABNORMAL HIGH (ref 1.7–2.4)
Magnesium: 2.3 mg/dL (ref 1.7–2.4)

## 2021-11-13 LAB — BASIC METABOLIC PANEL
Anion gap: 8 (ref 5–15)
BUN: 6 mg/dL (ref 6–20)
CO2: 26 mmol/L (ref 22–32)
Calcium: 8.6 mg/dL — ABNORMAL LOW (ref 8.9–10.3)
Chloride: 105 mmol/L (ref 98–111)
Creatinine, Ser: 0.86 mg/dL (ref 0.44–1.00)
GFR, Estimated: >60 mL/min (ref >60)
Glucose, Bld: 192 mg/dL — ABNORMAL HIGH (ref 70–99)
Potassium: 3.6 mmol/L (ref 3.5–5.1)
Sodium: 139 mmol/L (ref 135–145)

## 2021-11-13 LAB — HEMOGLOBIN A1C
Hgb A1c MFr Bld: 4.6 % — ABNORMAL LOW (ref 4.8–5.6)
Mean Plasma Glucose: 85.32 mg/dL

## 2021-11-13 LAB — ZINC: Zinc: 43 ug/dL — ABNORMAL LOW (ref 44–115)

## 2021-11-13 LAB — PROCALCITONIN: Procalcitonin: <0.1 ng/mL

## 2021-11-13 MED ORDER — THIAMINE HCL 100 MG/ML IJ SOLN: 100.0000 mg | Freq: Every day | INTRAMUSCULAR | Status: DC

## 2021-11-13 MED ORDER — ADULT MULTIVITAMIN W/MINERALS CH
1.0000 | ORAL_TABLET | Freq: Every day | ORAL | Status: DC
  Administered 2021-11-14 – 2021-11-17 (×4): 1
  Filled 2021-11-13 (×4): qty 1

## 2021-11-13 MED ORDER — PROPOFOL 1000 MG/100ML IV EMUL
5.0000 ug/kg/min | INTRAVENOUS | Status: DC
  Administered 2021-11-13: 10 ug/kg/min via INTRAVENOUS
  Administered 2021-11-13: 20 ug/kg/min via INTRAVENOUS
  Administered 2021-11-14: 50 ug/kg/min via INTRAVENOUS
  Administered 2021-11-14: 35 ug/kg/min via INTRAVENOUS
  Administered 2021-11-14: 50 ug/kg/min via INTRAVENOUS
  Administered 2021-11-14: 25 ug/kg/min via INTRAVENOUS
  Administered 2021-11-15: 60 ug/kg/min via INTRAVENOUS
  Administered 2021-11-15: 80 ug/kg/min via INTRAVENOUS
  Administered 2021-11-15 (×2): 60 ug/kg/min via INTRAVENOUS
  Administered 2021-11-15: 70 ug/kg/min via INTRAVENOUS
  Administered 2021-11-15: 50 ug/kg/min via INTRAVENOUS
  Administered 2021-11-15: 60 ug/kg/min via INTRAVENOUS
  Administered 2021-11-16: 80 ug/kg/min via INTRAVENOUS
  Administered 2021-11-16: 70 ug/kg/min via INTRAVENOUS
  Administered 2021-11-16: 60 ug/kg/min via INTRAVENOUS
  Filled 2021-11-13 (×5): qty 100
  Filled 2021-11-13: qty 200
  Filled 2021-11-13 (×11): qty 100

## 2021-11-13 MED ORDER — FOLIC ACID 1 MG PO TABS
1.0000 mg | ORAL_TABLET | Freq: Every day | ORAL | Status: DC
  Administered 2021-11-14 – 2021-11-17 (×4): 1 mg
  Filled 2021-11-13 (×4): qty 1

## 2021-11-13 MED ORDER — DEXTROSE 5 % IV SOLN
30.0000 mmol | Freq: Once | INTRAVENOUS | Status: AC
  Administered 2021-11-13: 30 mmol via INTRAVENOUS
  Filled 2021-11-13: qty 10

## 2021-11-13 MED ORDER — INSULIN ASPART 100 UNIT/ML IJ SOLN
0.0000 [IU] | INTRAMUSCULAR | Status: DC
  Administered 2021-11-13 (×2): 3 [IU] via SUBCUTANEOUS
  Administered 2021-11-13: 2 [IU] via SUBCUTANEOUS
  Administered 2021-11-13: 5 [IU] via SUBCUTANEOUS
  Administered 2021-11-13: 3 [IU] via SUBCUTANEOUS
  Administered 2021-11-14: 2 [IU] via SUBCUTANEOUS
  Administered 2021-11-14 (×3): 3 [IU] via SUBCUTANEOUS
  Administered 2021-11-14: 2 [IU] via SUBCUTANEOUS
  Administered 2021-11-14: 3 [IU] via SUBCUTANEOUS
  Administered 2021-11-15 (×2): 5 [IU] via SUBCUTANEOUS
  Administered 2021-11-15 (×3): 3 [IU] via SUBCUTANEOUS

## 2021-11-13 MED ORDER — POTASSIUM CHLORIDE 10 MEQ/100ML IV SOLN
10.0000 meq | INTRAVENOUS | Status: AC
  Administered 2021-11-13 (×5): 10 meq via INTRAVENOUS
  Filled 2021-11-13 (×5): qty 100

## 2021-11-13 MED ORDER — THIAMINE HCL 100 MG PO TABS
100.0000 mg | ORAL_TABLET | Freq: Every day | ORAL | Status: DC
  Administered 2021-11-14 – 2021-11-17 (×4): 100 mg via NASOGASTRIC
  Filled 2021-11-13 (×4): qty 1

## 2021-11-13 MED ORDER — FAMOTIDINE 20 MG PO TABS
20.0000 mg | ORAL_TABLET | Freq: Two times a day (BID) | ORAL | Status: DC
  Administered 2021-11-13 – 2021-11-17 (×10): 20 mg
  Filled 2021-11-13 (×10): qty 1

## 2021-11-13 MED ORDER — VITAMIN B-12 1000 MCG PO TABS
1000.0000 ug | ORAL_TABLET | Freq: Every day | ORAL | Status: DC
  Administered 2021-11-13 – 2021-11-17 (×5): 1000 ug
  Filled 2021-11-13 (×5): qty 1

## 2021-11-13 MED ORDER — FUROSEMIDE 10 MG/ML IJ SOLN
20.0000 mg | Freq: Once | INTRAMUSCULAR | Status: AC
  Administered 2021-11-13: 20 mg via INTRAVENOUS
  Filled 2021-11-13: qty 2

## 2021-11-13 MED ORDER — IPRATROPIUM-ALBUTEROL 0.5-2.5 (3) MG/3ML IN SOLN
3.0000 mL | RESPIRATORY_TRACT | Status: DC
  Administered 2021-11-13 – 2021-11-17 (×24): 3 mL via RESPIRATORY_TRACT
  Filled 2021-11-13 (×23): qty 3

## 2021-11-13 NOTE — Progress Notes (Signed)
Pt's head turned left and arms rotated.  ETT secure. ?

## 2021-11-13 NOTE — Progress Notes (Signed)
?  Transition of Care (TOC) Screening Note ? ? ?Patient Details  ?Name: Kara Haynes ?Date of Birth: [birth year] ? ? ?Transition of Care (TOC) CM/SW Contact:    ?Tom-Johnson, Hershal Coria, RN ?Phone Number: ?11/13/2021, 3:08 PM ? ?Patient is admitted for chronic hypoxic/hypercapnic respiratory failure in the setting of asthma exacerbation. Currently orally intubated. Transition of Care Department [facility]) has reviewed patient and no TOC needs or recommendations have been identified at this time. TOC will continue to monitor patient advancement through interdisciplinary progression rounds. If new patient transition needs arise, please place a TOC consult. ?  ?

## 2021-11-13 NOTE — Procedures (Signed)
Cortrak  Tube Type:  Cortrak - 43 inches Tube Location:  Left nare Initial Placement:  Stomach Secured by: Bridle Technique Used to Measure Tube Placement:  Marking at nare/corner of mouth Cortrak Secured At:  64 cm  Cortrak Tube Team Note:  Consult received to place a Cortrak feeding tube.   X-ray is required, abdominal x-ray has been ordered by the Cortrak team. Please confirm tube placement before using the Cortrak tube.   If the tube becomes dislodged please keep the tube and contact the Cortrak team at [email or web address] ([email or web address]) for replacement.  If after hours and replacement cannot be delayed, place a NG tube and confirm placement with an abdominal x-ray.    Maesyn Frisinger MS, RD, LDN Please refer to AMION for RD and/or RD on-call/weekend/after hours pager   

## 2021-11-13 NOTE — Progress Notes (Signed)
eLink Physician-Brief Progress Note ?Patient Name: Kara Haynes ?DOB: 1990-10-19 ?MRN: [id number] ? ? ?Date of Service ? 11/13/2021  ?HPI/Events of Note ? Request by bedside RN for nicotine patch. ? ?On camera check patient on vent sedated on fentanyl 400 mcg/h and versed 10 mg/h and propofol 20 mcg/kg/min ?  ?eICU Interventions ? No indication for nicotine patch at this time  ? ? ? ?Intervention Category ?Minor Interventions: Agitation / anxiety - evaluation and management ? ?Kara Haynes ?11/13/2021, 10:12 PM ?

## 2021-11-13 NOTE — Progress Notes (Signed)
Wasted 60 mL ketamine gtt with Norva Karvonen, RN.  ?

## 2021-11-13 NOTE — Progress Notes (Signed)
RT and Rnx4 supine patient per MD order. Patient tolerated well. RT secured ETT with ETT holder. No complications. RT will continue to monitor.  ?

## 2021-11-13 NOTE — Plan of Care (Signed)

## 2021-11-13 NOTE — Progress Notes (Signed)
Brief Nutrition Note ? ?RD completed initial nutrition assessment yesterday, 11/12/21. Multiple vitamin and mineral labs were ordered. Vitamin B12 lab came back low at 122. Discussed ordering replacement vitamin B12 with MD who agreed. Verbal with readback orders placed for vitamin B12 tablet 1000 mcg daily per tube. RD will continue to follow pt during acute admission. ? ? ?Mertie Clause, MS, RD, LDN ?Inpatient Clinical Dietitian ?Please see AMiON for contact information. ? ?

## 2021-11-13 NOTE — Progress Notes (Signed)
RT NOTE: ? ?Pts head turned to the right and arms rotated. ETT secure.  ?

## 2021-11-13 NOTE — Progress Notes (Signed)
NAME:  Kara Haynes, MRN:  [id number], DOB:  04/03/1991, LOS: 2 ADMISSION DATE:  11/10/2021, CONSULTATION DATE:  11/11/2021 REFERRING MD:  Dr. Alvino Chapel, CHIEF COMPLAINT:  Asthma Exacerbation    History of Present Illness:  Kara Haynes is a 31 y.o. female with a PMH significant for Asthma, Prior PE in 2015, GERD, obesity, and alcohol abuse who presented to the ED for  respiratory distress felt secondary to an asthma exacerbation that started 3 days prior to admission. Patient attempted to use home rescue inhaler with no relief in dyspnea. Of note patient was seen at [facility] ED for similar symptoms early this am but was felt safe to discharge from ED.  On ED presentation patient was seen ill appearing with fever, tachycardia, and tachypnea.  Concern for acute asthma exacerbation possibly next in the setting of viral illness.  Lab work significant for glucose 148, albumin 3.3, lactic acid 3.0 with downtrend to 1.8.  Given persistent tachycardia tachypnea and severe bronchospasm patient was placed on BiPAP and critical care/pulmonary was consulted.  Pertinent  Medical History  Asthma Prior PE in 2015 GERD Alcohol abuse  Significant Hospital Events: Including procedures, antibiotic start and stop dates in addition to other pertinent events   2/27 presented to [facility], ED for complaints of chest tightness and dyspnea found to be in severe asthma exacerbation 2/28 intubated overnight, acutely hypoxic and hypercarbic this AM, sedated and paralyzed 3/1 improving ABG this AM, plan to supinate  Interim History / Subjective:  No acute overnight events   Objective   Blood pressure (!) 98/43, pulse (!) 140, temperature (!) 96.9 F (36.1 C), temperature source Oral, resp. rate (!) 26, height 5\' 2"  (1.575 m), weight 81.6 kg, last menstrual period 11/07/2021, SpO2 100 %.    Vent Mode: PRVC FiO2 (%):  [40 %-70 %] 40 % Set Rate:  [24 bmp-26 bmp] 26 bmp Vt Set:  [380 mL-450 mL] 450  mL PEEP:  [5 cmH20-8 cmH20] 5 cmH20 Plateau Pressure:  [24 cmH20-28 cmH20] 26 cmH20   Intake/Output Summary (Last 24 hours) at 11/13/2021 01/13/2022 Last data filed at 11/13/2021 0800 Gross per 24 hour  Intake 7190.54 ml  Output 3425 ml  Net 3765.54 ml    Filed Weights   11/10/21 1546  Weight: 81.6 kg     General:  critically ill-appearing F, intubated and sedated HEENT: MM pink/moist, proned  Neuro: RASS -5 on Ketamine, versed and Fentanyl CV: s1s2 rrr, no m/r/g PULM:  wheezing in all lung fields, synchronous with vent with, vent pressures within goal  GI: proned, not examined  Extremities: warm/dry, no edema  Skin: no rashes or lesions   Labs reviewed   pH 7.3, pCO2 58, pO2 204, bicarb 29, glu 192, phos 1.9, procal <0.10    Resolved Hospital Problem list   Hyponatremia  Assessment & Plan:     Severe asthma exacerbation with acute hypoxic and hypercarbic respiratory failure with respiratory acidosis  -At baseline patient utilizes rescue albuterol only at baseline -CT chest unremarkable  -severe distress yesterday, now improving with deep sedation, prn paralytics and proning -plan to supinate this AM -continue Ketamine, Fentanyl and versed gtts -hypotensive yesterday, off levophed today, suspect hypotension was sedation related  -continue Solumedrol 60mg  q6hrs, bronchodilators and pulmicort -RVP negative, Blood cx negative thus far -On Unasyn, continue, continue for now  --Maintain full vent support with SAT/SBT as tolerated -titrate Vent setting to maintain SpO2 greater than or equal to 90%. -HOB elevated 30 degrees. -  Plateau pressures less than 30 cm H20.  -Follow chest x-ray, ABG prn.   -Bronchial hygiene and RT/bronchodilator protocol.   Alcohol abuse -Patient states she takes about 12 shots per day with estimated last alcoholic consumption 2/25 -started on phenobarbital, continue CIWA scale -Supplement thiamine, folic acid, and multivitamin  Mild  hyperglycemia Monitor CBG for worsening hyperglycemia in setting of steroid use CBG goal 1 40-1 80 -continue SSI   Best Practice (right click and "Reselect all SmartList Selections" daily)   Diet/type: tubefeeds DVT prophylaxis: LMWH GI prophylaxis: PPI Lines: N/A Foley:  N/A Code Status:  full code Last date of multidisciplinary goals of care discussion: family update pending 3/1  Labs   CBC: Recent Labs  Lab 11/10/21 1517 11/11/21 0453 11/11/21 2036 11/12/21 0135 11/12/21 0345 11/12/21 1224 11/12/21 1436 11/12/21 1953 11/13/21 0328 11/13/21 0457  WBC 6.0 6.9  --  8.0  --   --   --   --  9.4  --   NEUTROABS 5.6  --   --  7.3  --   --   --   --   --   --   HGB 13.9 12.3   < > 12.7   < > 12.2 12.9 12.2 11.6* 11.9*  HCT 40.8 34.5*   < > 38.0   < > 36.0 38.0 36.0 35.1* 35.0*  MCV 99.0 97.7  --  100.0  --   --   --   --  102.3*  --   PLT 273 244  --  233  --   --   --   --  253  --    < > = values in this interval not displayed.     Basic Metabolic Panel: Recent Labs  Lab 11/10/21 1517 11/10/21 2042 11/11/21 0453 11/11/21 2036 11/12/21 0135 11/12/21 0345 11/12/21 1224 11/12/21 1436 11/12/21 1731 11/12/21 1953 11/13/21 0328 11/13/21 0457  NA 134*  --  136   < > 133*   < > 134* 135  --  138 139 140  K 3.8  --  3.8   < > 4.1   < > 3.3* 3.2*  --  3.4* 3.6 3.6  CL 101  --  107  --  101  --   --   --   --   --  105  --   CO2 20*  --  22  --  21*  --   --   --   --   --  26  --   GLUCOSE 178*  --  148*  --  188*  --   --   --   --   --  192*  --   BUN <5*  --  <5*  --  5*  --   --   --   --   --  6  --   CREATININE 0.89  --  0.71  --  0.81  --   --   --   --   --  0.86  --   CALCIUM 9.2  --  8.4*  --  8.7*  --   --   --   --   --  8.6*  --   MG  --  1.3* 2.1  --   --   --   --   --  3.1*  --  2.5*  --   PHOS  --  2.6  --   --   --   --   --   --  5.0*  --  1.9*  --    < > = values in this interval not displayed.    GFR: Estimated Creatinine Clearance: 93.8  mL/min (by C-G formula based on SCr of 0.86 mg/dL). Recent Labs  Lab 11/10/21 1517 11/10/21 1802 11/10/21 2042 11/10/21 2305 11/11/21 0453 11/12/21 0135 11/13/21 0328  PROCALCITON  --   --  <0.10  --  <0.10  --  <0.10  WBC 6.0  --   --   --  6.9 8.0 9.4  LATICACIDVEN 3.4* 3.0*  --  1.8  --   --   --      Liver Function Tests: Recent Labs  Lab 11/10/21 1517 11/11/21 0453 11/12/21 0135  AST 59* 35 53*  ALT 28 21 27   ALKPHOS 65 47 51  BILITOT 0.7 0.6 0.6  PROT 7.2 5.9* 6.3*  ALBUMIN 3.9 3.3* 3.2*    No results for input(s): LIPASE, AMYLASE in the last 168 hours. No results for input(s): AMMONIA in the last 168 hours.  ABG    Component Value Date/Time   PHART 7.312 (L) 11/13/2021 0457   PCO2ART 58.4 (H) 11/13/2021 0457   PO2ART 204 (H) 11/13/2021 0457   HCO3 29.7 (H) 11/13/2021 0457   TCO2 31 11/13/2021 0457   ACIDBASEDEF 7.0 (H) 11/12/2021 1953   O2SAT 100 11/13/2021 0457      Coagulation Profile: Recent Labs  Lab 11/10/21 1530  INR 0.9     Cardiac Enzymes: No results for input(s): CKTOTAL, CKMB, CKMBINDEX, TROPONINI in the last 168 hours.  HbA1C: Hgb A1c MFr Bld  Date/Time Value Ref Range Status  08/10/2014 01:00 AM 4.6 <5.7 % Final    Comment:    (NOTE)                                                                       According to the ADA Clinical Practice Recommendations for 2011, when HbA1c is used as a screening test:  >=6.5%   Diagnostic of Diabetes Mellitus           (if abnormal result is confirmed) 5.7-6.4%   Increased risk of developing Diabetes Mellitus References:Diagnosis and Classification of Diabetes Mellitus,Diabetes Care,2011,34(Suppl 1):S62-S69 and Standards of Medical Care in         Diabetes - 2011,Diabetes Care,2011,34 (Suppl 1):S11-S61.     CBG: Recent Labs  Lab 11/12/21 0739 11/12/21 1952 11/12/21 2335 11/13/21 0327 11/13/21 0807  GLUCAP 150* 185* 190* 169* 187*     Review of Systems:   Please see the  history of present illness. All other systems reviewed and are negative   Past Medical History:  She,  has a past medical history of Alcohol abuse, Allergic rhinitis, seasonal, Dyspnea, Eczema, GERD (gastroesophageal reflux disease), History of pulmonary embolus (PE) (08/09/2014), History of trichomoniasis, Left ovarian cyst, Mild intermittent asthma, Obesity (BMI 30-39.9), Pelvic adhesive disease, and Pneumonia.   Surgical History:   Past Surgical History:  Procedure Laterality Date   ABDOMINAL HYSTERECTOMY     LAPAROSCOPIC OVARIAN CYSTECTOMY Left 06/05/2020   Procedure: LAPAROSCOPIC OVARIAN CYSTECTOMY;  Surgeon: Locust Bing, MD;  Location: [facility] ;  Service: Gynecology;  Laterality: Left;   NO PAST SURGERIES     ROBOTIC ASSISTED TOTAL  HYSTERECTOMY WITH BILATERAL SALPINGO OOPHERECTOMY N/A 07/03/2020   Procedure: XI ROBOTIC ASSISTED  LEFT  SALPINGO OOPHORECTOMY/ LAPAROSCOPIC LYSIS OF ADHESIONS/LEFT URETEROLYSIS;  Surgeon: Adolphus Birchwood, MD;  Location: [facility];  Service: Gynecology;  Laterality: N/A;     Social History:   reports that she has been smoking cigarettes. She has a 6.00 pack-year smoking history. She has never used smokeless tobacco. She reports current alcohol use. She reports current drug use. Drug: Marijuana.   Family History:  Her family history includes Asthma in her maternal grandfather; Healthy in her father and mother; Hypertension in her maternal grandfather; Other in an other family member.   Allergies Allergies  Allergen Reactions   Fish Allergy Hives, Shortness Of Breath and Swelling    Mostly tilapia     Home Medications  Prior to Admission medications   Medication Sig Start Date End Date Taking? Authorizing Provider  acetaminophen (TYLENOL) 500 MG tablet Take 500 mg by mouth every 6 (six) hours as needed for mild pain or moderate pain.   Yes [provider]  albuterol (VENTOLIN HFA) 108 (90 Base) MCG/ACT inhaler Inhale 2 puffs  into the lungs every 6 (six) hours as needed for wheezing or shortness of breath.    Yes [provider]  ipratropium (ATROVENT HFA) 17 MCG/ACT inhaler Inhale 2 puffs into the lungs every 4 (four) hours as needed for wheezing.   Yes [provider]  cetirizine (ZYRTEC ALLERGY) 10 MG tablet Take 1 tablet (10 mg total) by mouth at bedtime. Patient not taking: Reported on 11/10/2021 06/09/21   Gailen Shelter, PA  fluticasone (FLOVENT HFA) 44 MCG/ACT inhaler Inhale 1 puff into the lungs daily. Patient not taking: Reported on 11/10/2021 06/09/21   Gailen Shelter, PA  pantoprazole (PROTONIX) 40 MG tablet Take 1 tablet (40 mg total) by mouth daily. Patient not taking: Reported on 05/27/2021 11/02/20   Sudie Grumbling, NP  predniSONE (DELTASONE) 20 MG tablet Take 3 tablets (60 mg total) by mouth daily for 5 days. 11/10/21 11/15/21  Virgina Norfolk, DO     Critical care time:    CRITICAL CARE Performed by: Darcella Gasman Darren Nodal  Total critical care time: 40 minutes  Critical care time was exclusive of separately billable procedures and treating other patients.  Critical care was necessary to treat or prevent imminent or life-threatening deterioration.  Critical care was time spent personally by me on the following activities: development of treatment plan with patient and/or surrogate as well as nursing, discussions with consultants, evaluation of patient's response to treatment, examination of patient, obtaining history from patient or surrogate, ordering and performing treatments and interventions, ordering and review of laboratory studies, ordering and review of radiographic studies, pulse oximetry and re-evaluation of patient's condition.      Darcella Gasman Jaidan Prevette, PA-C [facility] See Amion for pager If no response to pager , please call [phone] until 7pm After 7:00 pm call Elink  [phone]

## 2021-11-14 DIAGNOSIS — E8729 Other acidosis: Secondary | ICD-10-CM

## 2021-11-14 DIAGNOSIS — F10930 Alcohol use, unspecified with withdrawal, uncomplicated: Secondary | ICD-10-CM

## 2021-11-14 LAB — GLUCOSE, CAPILLARY
Glucose-Capillary: 155 mg/dL — ABNORMAL HIGH (ref 70–99)
Glucose-Capillary: 143 mg/dL — ABNORMAL HIGH (ref 70–99)
Glucose-Capillary: 150 mg/dL — ABNORMAL HIGH (ref 70–99)
Glucose-Capillary: 177 mg/dL — ABNORMAL HIGH (ref 70–99)
Glucose-Capillary: 193 mg/dL — ABNORMAL HIGH (ref 70–99)
Glucose-Capillary: 169 mg/dL — ABNORMAL HIGH (ref 70–99)

## 2021-11-14 LAB — POCT I-STAT 7, (LYTES, BLD GAS, ICA,H+H)
Acid-Base Excess: 6 mmol/L — ABNORMAL HIGH (ref 0.0–2.0)
Acid-Base Excess: 8 mmol/L — ABNORMAL HIGH (ref 0.0–2.0)
Bicarbonate: 33.5 mmol/L — ABNORMAL HIGH (ref 20.0–28.0)
Bicarbonate: 37.4 mmol/L — ABNORMAL HIGH (ref 20.0–28.0)
Calcium, Ion: 1.24 mmol/L (ref 1.15–1.40)
Calcium, Ion: 1.24 mmol/L (ref 1.15–1.40)
HCT: 33 % — ABNORMAL LOW (ref 36.0–46.0)
HCT: 36 % (ref 36.0–46.0)
Hemoglobin: 11.2 g/dL — ABNORMAL LOW (ref 12.0–15.0)
Hemoglobin: 12.2 g/dL (ref 12.0–15.0)
O2 Saturation: 92 %
O2 Saturation: 96 %
Patient temperature: 98.6
Patient temperature: 98.7
Potassium: 4.6 mmol/L (ref 3.5–5.1)
Potassium: 4.8 mmol/L (ref 3.5–5.1)
Sodium: 138 mmol/L (ref 135–145)
Sodium: 138 mmol/L (ref 135–145)
TCO2: 35 mmol/L — ABNORMAL HIGH (ref 22–32)
TCO2: 40 mmol/L — ABNORMAL HIGH (ref 22–32)
pCO2 arterial: 64 mmHg — ABNORMAL HIGH (ref 32–48)
pCO2 arterial: 80.8 mmHg (ref 32–48)
pH, Arterial: 7.274 — ABNORMAL LOW (ref 7.35–7.45)
pH, Arterial: 7.327 — ABNORMAL LOW (ref 7.35–7.45)
pO2, Arterial: 77 mmHg — ABNORMAL LOW (ref 83–108)
pO2, Arterial: 94 mmHg (ref 83–108)

## 2021-11-14 LAB — TRIGLYCERIDES: Triglycerides: 62 mg/dL (ref <150)

## 2021-11-14 LAB — PHOSPHORUS: Phosphorus: 3 mg/dL (ref 2.5–4.6)

## 2021-11-14 LAB — MAGNESIUM: Magnesium: 2.4 mg/dL (ref 1.7–2.4)

## 2021-11-14 LAB — VITAMIN C: Vitamin C: 0.3 mg/dL — ABNORMAL LOW (ref 0.4–2.0)

## 2021-11-14 LAB — BASIC METABOLIC PANEL
Anion gap: 8 (ref 5–15)
BUN: 7 mg/dL (ref 6–20)
CO2: 33 mmol/L — ABNORMAL HIGH (ref 22–32)
Calcium: 8.6 mg/dL — ABNORMAL LOW (ref 8.9–10.3)
Chloride: 100 mmol/L (ref 98–111)
Creatinine, Ser: 0.8 mg/dL (ref 0.44–1.00)
GFR, Estimated: >60 mL/min (ref >60)
Glucose, Bld: 163 mg/dL — ABNORMAL HIGH (ref 70–99)
Potassium: 4.8 mmol/L (ref 3.5–5.1)
Sodium: 141 mmol/L (ref 135–145)

## 2021-11-14 MED ORDER — ASCORBIC ACID 500 MG PO TABS
500.0000 mg | ORAL_TABLET | Freq: Two times a day (BID) | ORAL | Status: DC
  Administered 2021-11-14 – 2021-11-17 (×8): 500 mg
  Filled 2021-11-14 (×8): qty 1

## 2021-11-14 MED ORDER — ZINC SULFATE 220 (50 ZN) MG PO CAPS
220.0000 mg | ORAL_CAPSULE | Freq: Two times a day (BID) | ORAL | Status: DC
  Administered 2021-11-14 – 2021-11-17 (×8): 220 mg
  Filled 2021-11-14 (×8): qty 1

## 2021-11-14 MED ORDER — ENOXAPARIN SODIUM 40 MG/0.4ML IJ SOSY
40.0000 mg | PREFILLED_SYRINGE | INTRAMUSCULAR | Status: DC
  Administered 2021-11-14 – 2021-11-19 (×6): 40 mg via SUBCUTANEOUS
  Filled 2021-11-14 (×6): qty 0.4

## 2021-11-14 MED ORDER — POLYETHYLENE GLYCOL 3350 17 G PO PACK: 17.0000 g | PACK | Freq: Every day | ORAL | Status: DC | PRN

## 2021-11-14 MED ORDER — FUROSEMIDE 10 MG/ML IJ SOLN
20.0000 mg | Freq: Once | INTRAMUSCULAR | Status: AC
  Administered 2021-11-14: 20 mg via INTRAVENOUS
  Filled 2021-11-14: qty 2

## 2021-11-14 MED ORDER — ACETAMINOPHEN 650 MG RE SUPP
650.0000 mg | Freq: Four times a day (QID) | RECTAL | Status: DC | PRN
Start: 2021-11-14 — End: 2021-11-18

## 2021-11-14 MED ORDER — CISATRACURIUM BOLUS VIA INFUSION
2.5000 mg | Freq: Once | INTRAVENOUS | Status: AC
  Administered 2021-11-14: 2.5 mg via INTRAVENOUS
  Filled 2021-11-14: qty 3

## 2021-11-14 MED ORDER — ARTIFICIAL TEARS OPHTHALMIC OINT
1.0000 "application " | TOPICAL_OINTMENT | Freq: Three times a day (TID) | OPHTHALMIC | Status: DC
  Administered 2021-11-14 – 2021-11-16 (×6): 1 via OPHTHALMIC
  Filled 2021-11-14 (×2): qty 3.5

## 2021-11-14 MED ORDER — ACETAMINOPHEN 325 MG PO TABS
650.0000 mg | ORAL_TABLET | Freq: Four times a day (QID) | ORAL | Status: DC | PRN
Start: 2021-11-14 — End: 2021-11-18
  Administered 2021-11-16: 650 mg
  Filled 2021-11-14: qty 2

## 2021-11-14 MED ORDER — SODIUM CHLORIDE 0.9 % IV SOLN
0.0000 ug/kg/min | INTRAVENOUS | Status: DC
  Administered 2021-11-14 – 2021-11-15 (×3): 3 ug/kg/min via INTRAVENOUS
  Administered 2021-11-15: 1.5 ug/kg/min via INTRAVENOUS
  Filled 2021-11-14 (×4): qty 20

## 2021-11-14 NOTE — Progress Notes (Addendum)
Brief Nutrition Note ? ?RD completed initial nutrition assessment on 11/12/21. Multiple vitamin and mineral labs were ordered. Zinc lab came back low at 43. Discussed ordering replacement zinc with MD who agreed. Verbal with readback orders placed for zinc sulfate 220 mg BID per tube x 14 days. RD will continue to follow pt during acute admission. ? ?ADDENDUM (1328): Rechecked labs this afternoon and noted vitamin C lab came back low at 0.3. Discussed ordering replacement vitamin C with MD who agreed. Verbal with readback order placed for vitamin C 500 mg BID per tube. ? ?Gustavus Bryant, MS, RD, LDN ?Inpatient Clinical Dietitian ?Please see AMiON for contact information. ? ?

## 2021-11-14 NOTE — Progress Notes (Signed)
NAME:  Kara Haynes, MRN:  [id number], DOB:  05-Mar-1991, LOS: 3 ADMISSION DATE:  11/10/2021, CONSULTATION DATE:  11/11/21 REFERRING MD:  Dr. Alvino Chapelhoi , CHIEF COMPLAINT:  Asthma exacerbation  History of Present Illness:  Kara Haynes is a 31 y.o. female with a PMH significant for Asthma, Prior PE in 2015, GERD, obesity, and alcohol abuse who presented to the ED for  respiratory distress felt secondary to an asthma exacerbation that started 3 days prior to admission. Patient attempted to use home rescue inhaler with no relief in dyspnea. Of note patient was [facility] ED for similar symptoms early this am but was felt safe to discharge from ED.   On ED presentation patient was seen ill appearing with fever, tachycardia, and tachypnea.  Concern for acute asthma exacerbation possibly next in the setting of viral illness.  Lab work significant for glucose 148, albumin 3.3, lactic acid 3.0 with downtrend to 1.8.  Given persistent tachycardia tachypnea and severe bronchospasm patient was placed on BiPAP and critical care/pulmonary was consulted.  Pertinent  Medical History  Asthma Prior PE in 2015 GERD Alcohol abuse  Significant Hospital Events: Including procedures, antibiotic start and stop dates in addition to other pertinent events   2/27 [facility] to [facility] ED for complaints of chest tightness and dyspnea found to be in severe asthma exacerbation 2/28 intubated overnight, acutely hypoxic and hypercarbic this AM, sedated and paralyzed 3/1 improving ABG this AM, plan to supinate  Interim History / Subjective:  ON ABG showed worsening hypercapnia. PRN vec added Remains sedated on ventilator  Objective   Blood pressure 127/67, pulse 98, temperature 98.6 F (37 C), temperature source Axillary, resp. rate (!) 22, height 5\' 2"  (1.575 m), weight 81.6 kg, last menstrual period 11/07/2021, SpO2 97 %.    Vent Mode: PRVC FiO2 (%):  [30 %-40 %] 30 % Set Rate:  [22 bmp-26  bmp] 22 bmp Vt Set:  [450 mL] 450 mL PEEP:  [5 cmH20] 5 cmH20 Plateau Pressure:  [19 cmH20-28 cmH20] 22 cmH20   Intake/Output Summary (Last 24 hours) at 11/14/2021 0750 Last data filed at 11/14/2021 0600 Gross per 24 hour  Intake 4380.16 ml  Output 2900 ml  Net 1480.16 ml   Filed Weights   11/10/21 1546  Weight: 81.6 kg    Examination: General: sedated, intubated, ill appearing HENT: ETT and OGT in place Lungs: Good air movement at bilateral lung bases Cardiovascular: tachycardic, no mummur Extremities: No edema, warm to touch Neuro: sedated. Not follow commands  Resolved Hospital Problem list     Assessment & Plan:  Severe asthma exacerbation with acute hypoxic and hypercarbic respiratory failure with respiratory acidosis  Off of Levophed.  -On Unasyn for possible aspiration PNA (day 3/7).  -Duonebs Q4h with solumedrol, pulmicort -Will repeat ABG later this PM and attempt SBT -Maintain full vent support with SAT/SBT as tolerated -Titrate Vent setting to maintain SpO2 greater than or equal to 90%. -HOB elevated 30 degrees. -Plateau pressures less than 30 cm H20.  -Follow chest x-ray, ABG prn.   -Bronchial hygiene and RT/bronchodilator protocol.  Alcohol abuse Last alcoholic consumption 2/25. Consume 12 shots a day. CIWA of 0 last night. She is on versed drip. Should be out of withdraw window.  -Continue phenobarbital protocol, continue CIWA scale -Supplement thiamine, folic acid, and multivitamin   Mild hyperglycemia Monitor CBG for worsening hyperglycemia in setting of steroid use CBG goal 140-180 -continue SSI  Best Practice (right click and "Reselect all  SmartList Selections" daily)   Diet/type: tubefeeds DVT prophylaxis: LMWH GI prophylaxis: PPI Lines: no central line Foley:  N/A Code Status:  full code  Labs   CBC: Recent Labs  Lab 11/10/21 1517 11/11/21 0453 11/11/21 2036 11/12/21 0135 11/12/21 0345 11/13/21 0328 11/13/21 0457 11/13/21 1126  11/13/21 2328 11/14/21 0046  WBC 6.0 6.9  --  8.0  --  9.4  --   --   --   --   NEUTROABS 5.6  --   --  7.3  --   --   --   --   --   --   HGB 13.9 12.3   < > 12.7   < > 11.6* 11.9* 12.2 11.9* 11.2*  HCT 40.8 34.5*   < > 38.0   < > 35.1* 35.0* 36.0 35.0* 33.0*  MCV 99.0 97.7  --  100.0  --  102.3*  --   --   --   --   PLT 273 244  --  233  --  253  --   --   --   --    < > = values in this interval not displayed.    Basic Metabolic Panel: Recent Labs  Lab 11/10/21 1517 11/10/21 2042 11/10/21 2042 11/11/21 0453 11/11/21 2036 11/12/21 0135 11/12/21 0345 11/12/21 1731 11/12/21 1953 11/13/21 0328 11/13/21 0457 11/13/21 1126 11/13/21 1710 11/13/21 2328 11/14/21 0046 11/14/21 0335  NA 134*  --   --  136   < > 133*   < >  --    < > 139 140 139  --  138 138  --   K 3.8  --   --  3.8   < > 4.1   < >  --    < > 3.6 3.6 4.3  --  4.9 4.8  --   CL 101  --   --  107  --  101  --   --   --  105  --   --   --   --   --   --   CO2 20*  --   --  22  --  21*  --   --   --  26  --   --   --   --   --   --   GLUCOSE 178*  --   --  148*  --  188*  --   --   --  192*  --   --   --   --   --   --   BUN <5*  --   --  <5*  --  5*  --   --   --  6  --   --   --   --   --   --   CREATININE 0.89  --   --  0.71  --  0.81  --   --   --  0.86  --   --   --   --   --   --   CALCIUM 9.2  --   --  8.4*  --  8.7*  --   --   --  8.6*  --   --   --   --   --   --   MG  --  1.3*   < > 2.1  --   --   --  3.1*  --  2.5*  --   --  2.3  --   --  2.4  PHOS  --  2.6  --   --   --   --   --  5.0*  --  1.9*  --   --  4.1  --   --  3.0   < > = values in this interval not displayed.   GFR: Estimated Creatinine Clearance: 93.8 mL/min (by C-G formula based on SCr of 0.86 mg/dL). Recent Labs  Lab 11/10/21 1517 11/10/21 1802 11/10/21 2042 11/10/21 2305 11/11/21 0453 11/12/21 0135 11/13/21 0328  PROCALCITON  --   --  <0.10  --  <0.10  --  <0.10  WBC 6.0  --   --   --  6.9 8.0 9.4  LATICACIDVEN 3.4* 3.0*  --  1.8   --   --   --     Liver Function Tests: Recent Labs  Lab 11/10/21 1517 11/11/21 0453 11/12/21 0135  AST 59* 35 53*  ALT 28 21 27   ALKPHOS 65 47 51  BILITOT 0.7 0.6 0.6  PROT 7.2 5.9* 6.3*  ALBUMIN 3.9 3.3* 3.2*   No results for input(s): LIPASE, AMYLASE in the last 168 hours. No results for input(s): AMMONIA in the last 168 hours.  ABG    Component Value Date/Time   PHART 7.327 (L) 11/14/2021 0046   PCO2ART 64.0 (H) 11/14/2021 0046   PO2ART 94 11/14/2021 0046   HCO3 33.5 (H) 11/14/2021 0046   TCO2 35 (H) 11/14/2021 0046   ACIDBASEDEF 7.0 (H) 11/12/2021 1953   O2SAT 96 11/14/2021 0046     Coagulation Profile: Recent Labs  Lab 11/10/21 1530  INR 0.9    Cardiac Enzymes: No results for input(s): CKTOTAL, CKMB, CKMBINDEX, TROPONINI in the last 168 hours.  HbA1C: Hgb A1c MFr Bld  Date/Time Value Ref Range Status  11/13/2021 03:28 AM 4.6 (L) 4.8 - 5.6 % Final    Comment:    (NOTE) Pre diabetes:          5.7%-6.4%  Diabetes:              >6.4%  Glycemic control for   <7.0% adults with diabetes   08/10/2014 01:00 AM 4.6 <5.7 % Final    Comment:    (NOTE)                                                                       According to the ADA Clinical Practice Recommendations for 2011, when HbA1c is used as a screening test:  >=6.5%   Diagnostic of Diabetes Mellitus           (if abnormal result is confirmed) 5.7-6.4%   Increased risk of developing Diabetes Mellitus References:Diagnosis and Classification of Diabetes Mellitus,Diabetes Care,2011,34(Suppl 1):S62-S69 and Standards of Medical Care in         Diabetes - 2011,Diabetes Care,2011,34 (Suppl 1):S11-S61.     CBG: Recent Labs  Lab 11/13/21 1606 11/13/21 1949 11/13/21 2325 11/14/21 0334 11/14/21 0734  GLUCAP 176* 203* 145* 155* 143*    Review of Systems:     Past Medical History:  She,  has a past medical history of Alcohol abuse, Allergic rhinitis, seasonal, Dyspnea, Eczema, GERD  (gastroesophageal reflux disease), History of pulmonary embolus (PE) (08/09/2014),  History of trichomoniasis, Left ovarian cyst, Mild intermittent asthma, Obesity (BMI 30-39.9), Pelvic adhesive disease, and Pneumonia.   Surgical History:   Past Surgical History:  Procedure Laterality Date   ABDOMINAL HYSTERECTOMY     LAPAROSCOPIC OVARIAN CYSTECTOMY Left 06/05/2020   Procedure: LAPAROSCOPIC OVARIAN CYSTECTOMY;  Surgeon: [place] Bing, MD;  Location: [facility];  Service: Gynecology;  Laterality: Left;   NO PAST SURGERIES     ROBOTIC ASSISTED TOTAL HYSTERECTOMY WITH BILATERAL SALPINGO OOPHERECTOMY N/A 07/03/2020   Procedure: XI ROBOTIC ASSISTED  LEFT  SALPINGO OOPHORECTOMY/ LAPAROSCOPIC LYSIS OF ADHESIONS/LEFT URETEROLYSIS;  Surgeon: Adolphus Birchwood, MD;  Location: [facility];  Service: Gynecology;  Laterality: N/A;     Social History:   reports that she has been smoking cigarettes. She has a 6.00 pack-year smoking history. She has never used smokeless tobacco. She reports current alcohol use. She reports current drug use. Drug: Marijuana.   Family History:  Her family history includes Asthma in her maternal grandfather; Healthy in her father and mother; Hypertension in her maternal grandfather; Other in an other family member.   Allergies Allergies  Allergen Reactions   Fish Allergy Hives, Shortness Of Breath and Swelling    Mostly tilapia     Home Medications  Prior to Admission medications   Medication Sig Start Date End Date Taking? Authorizing Provider  acetaminophen (TYLENOL) 500 MG tablet Take 500 mg by mouth every 6 (six) hours as needed for mild pain or moderate pain.   Yes [provider]  albuterol (VENTOLIN HFA) 108 (90 Base) MCG/ACT inhaler Inhale 2 puffs into the lungs every 6 (six) hours as needed for wheezing or shortness of breath.    Yes [provider]  ipratropium (ATROVENT HFA) 17 MCG/ACT inhaler Inhale 2 puffs into the lungs every 4  (four) hours as needed for wheezing.   Yes [provider]  cetirizine (ZYRTEC ALLERGY) 10 MG tablet Take 1 tablet (10 mg total) by mouth at bedtime. Patient not taking: Reported on 11/10/2021 06/09/21   Gailen Shelter, PA  fluticasone (FLOVENT HFA) 44 MCG/ACT inhaler Inhale 1 puff into the lungs daily. Patient not taking: Reported on 11/10/2021 06/09/21   Gailen Shelter, PA  pantoprazole (PROTONIX) 40 MG tablet Take 1 tablet (40 mg total) by mouth daily. Patient not taking: Reported on 05/27/2021 11/02/20   Sudie Grumbling, NP  predniSONE (DELTASONE) 20 MG tablet Take 3 tablets (60 mg total) by mouth daily for 5 days. 11/10/21 11/15/21  Virgina Norfolk, DO     Critical care time:

## 2021-11-14 NOTE — Progress Notes (Signed)
Pt has been receiving prn vecuronium pushes for vent dyssynchrony throughout the morning. Order was obtained to initiate Nibex gtt. RN unable to obtain any twitches using TOF despite waiting 2+ hours after last vecuronium push. Pt began double stacking the vent and had increased peak pressures and oxygen saturation began to lower. Initiated Nimbex gtt with bolus and lowest rate for vent synchrony, MD aware no twitches obtained with TOF prior to initiating infusion. Pt now synchronous with reduced peak pressures and oxygen saturation of 97%.  ?

## 2021-11-14 NOTE — Progress Notes (Addendum)
eLink Physician-Brief Progress Note ?Patient Name: Kara Haynes ?DOB: 04-06-1991 ?MRN: [id number] ? ? ?Date of Service ? 11/14/2021  ?HPI/Events of Note ? ABG 7.25/74/81 ?Worsening hypercapnia and oxygenation on ABG overnight.  Given vec with improved MV.  ?eICU Interventions ? Repeat ABG improved to 7.32/64/94  ?No vent changes ?Continue PRN vec as ordered  ? ? ? ?Intervention Category ?Intermediate Interventions: Bronchospasm - evaluation and treatment ? ?[name] ?11/14/2021, 12:09 AM ?

## 2021-11-14 NOTE — Progress Notes (Addendum)
RT NOTE: ? ?Pt auto peep increasing to 24, peak pressure 45. Pt coughing and HR increasing to 150's. ABG collected.  ? Latest Reference Range & Units 11/13/21 23:28  ?pH, Arterial 7.35 - 7.45  7.259 (L)  ?pCO2 arterial 32 - 48 mmHg 74.6 (HH)  ?pO2, Arterial 83 - 108 mmHg 81 (L)  ?TCO2 22 - 32 mmol/L 36 (H)  ?Acid-Base Excess 0.0 - 2.0 mmol/L 4.0 (H)  ?Bicarbonate 20.0 - 28.0 mmol/L 33.4 (H)  ?O2 Saturation % 93  ?Patient temperature  98.7 F  ?Collection site  art line  ? ? ?Critical ABG result reviewed by [facility]. PRN dose of paralytic given by RN.  ?Repeat ABG ordered for 0040.  ?

## 2021-11-14 NOTE — Progress Notes (Signed)
eLink Physician-Brief Progress Note ?Patient Name: Kara Haynes ?DOB: May 15, 1991 ?MRN: [id number] ? ? ?Date of Service ? 11/14/2021  ?HPI/Events of Note ? Multiple stools (>3) in last 24 hours. RN requesting flexiseal. ? ?No leukocytosis. Low suspicion for c.diff  ?eICU Interventions ? Flexiseal ordered  ? ? ? ?Intervention Category ?Minor Interventions: Routine modifications to care plan (e.g. PRN medications for pain, fever) ? ?Ihsan Nomura Mechele Collin ?11/14/2021, 9:27 PM ?

## 2021-11-15 DIAGNOSIS — E872 Acidosis, unspecified: Secondary | ICD-10-CM | POA: Diagnosis present

## 2021-11-15 DIAGNOSIS — J95859 Other complication of respirator [ventilator]: Secondary | ICD-10-CM

## 2021-11-15 DIAGNOSIS — J9601 Acute respiratory failure with hypoxia: Secondary | ICD-10-CM | POA: Diagnosis present

## 2021-11-15 LAB — POCT I-STAT 7, (LYTES, BLD GAS, ICA,H+H)
Acid-Base Excess: 11 mmol/L — ABNORMAL HIGH (ref 0.0–2.0)
Acid-Base Excess: 14 mmol/L — ABNORMAL HIGH (ref 0.0–2.0)
Acid-Base Excess: 10 mmol/L — ABNORMAL HIGH (ref 0.0–2.0)
Bicarbonate: 39 mmol/L — ABNORMAL HIGH (ref 20.0–28.0)
Bicarbonate: 40.8 mmol/L — ABNORMAL HIGH (ref 20.0–28.0)
Bicarbonate: 37.3 mmol/L — ABNORMAL HIGH (ref 20.0–28.0)
Calcium, Ion: 1.22 mmol/L (ref 1.15–1.40)
Calcium, Ion: 1.2 mmol/L (ref 1.15–1.40)
Calcium, Ion: 1.14 mmol/L — ABNORMAL LOW (ref 1.15–1.40)
HCT: 35 % — ABNORMAL LOW (ref 36.0–46.0)
HCT: 35 % — ABNORMAL LOW (ref 36.0–46.0)
HCT: 35 % — ABNORMAL LOW (ref 36.0–46.0)
Hemoglobin: 11.9 g/dL — ABNORMAL LOW (ref 12.0–15.0)
Hemoglobin: 11.9 g/dL — ABNORMAL LOW (ref 12.0–15.0)
Hemoglobin: 11.9 g/dL — ABNORMAL LOW (ref 12.0–15.0)
O2 Saturation: 96 %
O2 Saturation: 98 %
O2 Saturation: 96 %
Patient temperature: 98.1
Patient temperature: 98.3
Patient temperature: 98
Potassium: 4.9 mmol/L (ref 3.5–5.1)
Potassium: 4.9 mmol/L (ref 3.5–5.1)
Potassium: 4.5 mmol/L (ref 3.5–5.1)
Sodium: 135 mmol/L (ref 135–145)
Sodium: 134 mmol/L — ABNORMAL LOW (ref 135–145)
Sodium: 133 mmol/L — ABNORMAL LOW (ref 135–145)
TCO2: 41 mmol/L — ABNORMAL HIGH (ref 22–32)
TCO2: 43 mmol/L — ABNORMAL HIGH (ref 22–32)
TCO2: 39 mmol/L — ABNORMAL HIGH (ref 22–32)
pCO2 arterial: 72.2 mmHg (ref 32–48)
pCO2 arterial: 63.2 mmHg — ABNORMAL HIGH (ref 32–48)
pCO2 arterial: 62.2 mmHg — ABNORMAL HIGH (ref 32–48)
pH, Arterial: 7.34 — ABNORMAL LOW (ref 7.35–7.45)
pH, Arterial: 7.418 (ref 7.35–7.45)
pH, Arterial: 7.385 (ref 7.35–7.45)
pO2, Arterial: 93 mmHg (ref 83–108)
pO2, Arterial: 102 mmHg (ref 83–108)
pO2, Arterial: 88 mmHg (ref 83–108)

## 2021-11-15 LAB — GLUCOSE, CAPILLARY
Glucose-Capillary: 170 mg/dL — ABNORMAL HIGH (ref 70–99)
Glucose-Capillary: 174 mg/dL — ABNORMAL HIGH (ref 70–99)
Glucose-Capillary: 192 mg/dL — ABNORMAL HIGH (ref 70–99)
Glucose-Capillary: 194 mg/dL — ABNORMAL HIGH (ref 70–99)
Glucose-Capillary: 198 mg/dL — ABNORMAL HIGH (ref 70–99)
Glucose-Capillary: 214 mg/dL — ABNORMAL HIGH (ref 70–99)
Glucose-Capillary: 220 mg/dL — ABNORMAL HIGH (ref 70–99)

## 2021-11-15 LAB — CULTURE, BLOOD (ROUTINE X 2)
Culture: NO GROWTH
Culture: NO GROWTH
Special Requests: ADEQUATE

## 2021-11-15 LAB — BASIC METABOLIC PANEL
Anion gap: 5 (ref 5–15)
BUN: 14 mg/dL (ref 6–20)
CO2: 36 mmol/L — ABNORMAL HIGH (ref 22–32)
Calcium: 8.5 mg/dL — ABNORMAL LOW (ref 8.9–10.3)
Chloride: 96 mmol/L — ABNORMAL LOW (ref 98–111)
Creatinine, Ser: 0.64 mg/dL (ref 0.44–1.00)
GFR, Estimated: >60 mL/min (ref >60)
Glucose, Bld: 200 mg/dL — ABNORMAL HIGH (ref 70–99)
Potassium: 4.8 mmol/L (ref 3.5–5.1)
Sodium: 137 mmol/L (ref 135–145)

## 2021-11-15 LAB — PHOSPHORUS: Phosphorus: 4.1 mg/dL (ref 2.5–4.6)

## 2021-11-15 LAB — CBC
HCT: 34.4 % — ABNORMAL LOW (ref 36.0–46.0)
Hemoglobin: 11.2 g/dL — ABNORMAL LOW (ref 12.0–15.0)
MCH: 34.3 pg — ABNORMAL HIGH (ref 26.0–34.0)
MCHC: 32.6 g/dL (ref 30.0–36.0)
MCV: 105.2 fL — ABNORMAL HIGH (ref 80.0–100.0)
Platelets: 280 10*3/uL (ref 150–400)
RBC: 3.27 MIL/uL — ABNORMAL LOW (ref 3.87–5.11)
RDW: 15.2 % (ref 11.5–15.5)
WBC: 12.7 10*3/uL — ABNORMAL HIGH (ref 4.0–10.5)
nRBC: 0 % (ref 0.0–0.2)

## 2021-11-15 LAB — MAGNESIUM: Magnesium: 2.5 mg/dL — ABNORMAL HIGH (ref 1.7–2.4)

## 2021-11-15 MED ORDER — INSULIN GLARGINE-YFGN 100 UNIT/ML ~~LOC~~ SOLN
15.0000 [IU] | Freq: Once | SUBCUTANEOUS | Status: AC
  Administered 2021-11-15: 15 [IU] via SUBCUTANEOUS
  Filled 2021-11-15: qty 0.15

## 2021-11-15 MED ORDER — INSULIN ASPART 100 UNIT/ML IJ SOLN
0.0000 [IU] | Freq: Three times a day (TID) | INTRAMUSCULAR | Status: DC
  Administered 2021-11-16: 7 [IU] via SUBCUTANEOUS
  Administered 2021-11-16: 11 [IU] via SUBCUTANEOUS
  Administered 2021-11-16: 3 [IU] via SUBCUTANEOUS
  Administered 2021-11-17: 7 [IU] via SUBCUTANEOUS
  Administered 2021-11-18: 3 [IU] via SUBCUTANEOUS

## 2021-11-15 MED ORDER — LORAZEPAM 2 MG/ML IJ SOLN
2.0000 mg | Freq: Once | INTRAMUSCULAR | Status: AC
  Administered 2021-11-15: 2 mg via INTRAVENOUS

## 2021-11-15 MED ORDER — FUROSEMIDE 10 MG/ML IJ SOLN
20.0000 mg | Freq: Once | INTRAMUSCULAR | Status: AC
  Administered 2021-11-15: 20 mg via INTRAVENOUS
  Filled 2021-11-15: qty 2

## 2021-11-15 MED ORDER — INSULIN ASPART 100 UNIT/ML IJ SOLN: 0.0000 [IU] | Freq: Every day | INTRAMUSCULAR | Status: DC

## 2021-11-15 NOTE — Progress Notes (Signed)
NAME:  Kara Haynes, MRN:  [id number], DOB:  [birth year], LOS: 4 ADMISSION DATE:  11/10/2021, CONSULTATION DATE:  11/11/21 REFERRING MD:  Dr. Maylene Roes , CHIEF COMPLAINT:  Asthma exacerbation  History of Present Illness:  Kara Haynes is a 31 y.o. female with a PMH significant for Asthma, Prior PE in 2015, GERD, obesity, and alcohol abuse who presented to the ED for  respiratory distress felt secondary to an asthma exacerbation that started 3 days prior to admission. Patient attempted to use home rescue inhaler with no relief in dyspnea. Of note patient was seen at [facility] ED for similar symptoms early this am but was felt safe to discharge from ED.   On ED presentation patient was seen ill appearing with fever, tachycardia, and tachypnea.  Concern for acute asthma exacerbation possibly next in the setting of viral illness.  Lab work significant for glucose 148, albumin 3.3, lactic acid 3.0 with downtrend to 1.8.  Given persistent tachycardia tachypnea and severe bronchospasm patient was placed on BiPAP and critical care/pulmonary was consulted.  Pertinent  Medical History  Asthma Prior PE in 2015 GERD Alcohol abuse  Significant Hospital Events: Including procedures, antibiotic start and stop dates in addition to other pertinent events   2/27 presented to [facility], ED for complaints of chest tightness and dyspnea found to be in severe asthma exacerbation 2/28 intubated overnight, acutely hypoxic and hypercarbic this AM, sedated and paralyzed 3/1 improving ABG this AM, plan to supinate  Interim History / Subjective:  Intubated and sedated.  Objective   Blood pressure 125/77, pulse 96, temperature 98.1 F (36.7 C), temperature source Oral, resp. rate (!) 22, height 5\' 2"  (1.575 m), weight 81.6 kg, last menstrual period 11/07/2021, SpO2 96 %.    Vent Mode: PRVC FiO2 (%):  [30 %] 30 % Set Rate:  [18 bmp-22 bmp] 22 bmp Vt Set:  [450 mL] 450 mL PEEP:  [5 cmH20] 5  cmH20 Plateau Pressure:  [18 [id number] cmH20] 18 cmH20   Intake/Output Summary (Last 24 hours) at 11/15/2021 0706 Last data filed at 11/15/2021 0600 Gross per 24 hour  Intake 3425.88 ml  Output 2800 ml  Net 625.88 ml    Filed Weights   11/10/21 1546  Weight: 81.6 kg    Examination: General: sedated, intubated, ill appearing HENT: ETT and OGT in place Lungs: Good air movement at bilateral lung bases.  No wheezing Cardiovascular: tachycardic, no mummur Extremities: No edema, warm to touch Neuro: sedated. Not follow commands  Resolved Hospital Problem list     Assessment & Plan:  Severe asthma exacerbation with acute hypoxic and hypercarbic respiratory failure  She was started on paralytics yesterday due to vent dyssynchrony.  Her respiratory rate was increased to 22 overnight with hypercapnia.  Her peak pressure and plateau remains within good range this morning without auto PEEP.  Will try to wean off paralytics today.  Allows permissive hypercapnia.  pH remains within good range. -On Unasyn for possible aspiration PNA (day 4/5).  -Duonebs Q4h with solumedrol, pulmicort -Maintain full vent support with SAT/SBT as tolerated -Titrate Vent setting to maintain SpO2 greater than or equal to 90%. -Plateau pressures less than 30 cm H20.  -Bronchial hygiene and RT/bronchodilator protocol.  Alcohol use disorder Last alcoholic consumption [id number]. Consume 12 shots a day. CIWA of 0 last night. She is on versed drip. Should be out of withdraw window.  She has been off of the versus drip. -Continue phenobarbital protocol, continue CIWA scale -Supplement thiamine, folic  acid, and multivitamin   Mild hyperglycemia Monitor CBG for worsening hyperglycemia in setting of steroid use CBG goal 140-180 -continue SSI  Best Practice (right click and "Reselect all SmartList Selections" daily)   Diet/type: tubefeeds DVT prophylaxis: LMWH GI prophylaxis: PPI Lines: no central line Foley:  N/A Code  Status:  full code  Labs   CBC: Recent Labs  Lab 11/10/21 1517 11/11/21 0453 11/11/21 2036 11/12/21 0135 11/12/21 0345 11/13/21 0328 11/13/21 0457 11/13/21 2328 11/14/21 0046 11/14/21 1033 11/15/21 0316 11/15/21 0502  WBC 6.0 6.9  --  8.0  --  9.4  --   --   --   --  12.7*  --   NEUTROABS 5.6  --   --  7.3  --   --   --   --   --   --   --   --   HGB 13.9 12.3   < > 12.7   < > 11.6*   < > 11.9* 11.2* 12.2 11.2* 11.9*  HCT 40.8 34.5*   < > 38.0   < > 35.1*   < > 35.0* 33.0* 36.0 34.4* 35.0*  MCV 99.0 97.7  --  100.0  --  102.3*  --   --   --   --  105.2*  --   PLT 273 244  --  233  --  253  --   --   --   --  280  --    < > = values in this interval not displayed.     Basic Metabolic Panel: Recent Labs  Lab 11/11/21 0453 11/11/21 2036 11/12/21 0135 11/12/21 0345 11/12/21 1731 11/12/21 1953 11/13/21 0328 11/13/21 0457 11/13/21 1710 11/13/21 2328 11/14/21 0046 11/14/21 0335 11/14/21 1033 11/15/21 0316 11/15/21 0502  NA 136   < > 133*   < >  --    < > 139   < >  --    < > 138 141 138 137 135  K 3.8   < > 4.1   < >  --    < > 3.6   < >  --    < > 4.8 4.8 4.6 4.8 4.9  CL 107  --  101  --   --   --  105  --   --   --   --  100  --  96*  --   CO2 22  --  21*  --   --   --  26  --   --   --   --  33*  --  36*  --   GLUCOSE 148*  --  188*  --   --   --  192*  --   --   --   --  163*  --  200*  --   BUN <5*  --  5*  --   --   --  6  --   --   --   --  7  --  14  --   CREATININE 0.71  --  0.81  --   --   --  0.86  --   --   --   --  0.80  --  0.64  --   CALCIUM 8.4*  --  8.7*  --   --   --  8.6*  --   --   --   --  8.6*  --  8.5*  --  MG 2.1  --   --   --  3.1*  --  2.5*  --  2.3  --   --  2.4  --  2.5*  --   PHOS  --   --   --   --  5.0*  --  1.9*  --  4.1  --   --  3.0  --  4.1  --    < > = values in this interval not displayed.    GFR: Estimated Creatinine Clearance: 100.9 mL/min (by C-G formula based on SCr of 0.64 mg/dL). Recent Labs  Lab 11/10/21 1517  11/10/21 1802 11/10/21 2042 11/10/21 2305 11/11/21 0453 11/12/21 0135 11/13/21 0328 11/15/21 0316  PROCALCITON  --   --  <0.10  --  <0.10  --  <0.10  --   WBC 6.0  --   --   --  6.9 8.0 9.4 12.7*  LATICACIDVEN 3.4* 3.0*  --  1.8  --   --   --   --      Liver Function Tests: Recent Labs  Lab 11/10/21 1517 11/11/21 0453 11/12/21 0135  AST 59* 35 53*  ALT 28 21 27   ALKPHOS 65 47 51  BILITOT 0.7 0.6 0.6  PROT 7.2 5.9* 6.3*  ALBUMIN 3.9 3.3* 3.2*    No results for input(s): LIPASE, AMYLASE in the last 168 hours. No results for input(s): AMMONIA in the last 168 hours.  ABG    Component Value Date/Time   PHART 7.340 (L) 11/15/2021 0502   PCO2ART 72.2 (HH) 11/15/2021 0502   PO2ART 93 11/15/2021 0502   HCO3 39.0 (H) 11/15/2021 0502   TCO2 41 (H) 11/15/2021 0502   ACIDBASEDEF 7.0 (H) 11/12/2021 1953   O2SAT 96 11/15/2021 0502      Coagulation Profile: Recent Labs  Lab 11/10/21 1530  INR 0.9     Cardiac Enzymes: No results for input(s): CKTOTAL, CKMB, CKMBINDEX, TROPONINI in the last 168 hours.  HbA1C: Hgb A1c MFr Bld  Date/Time Value Ref Range Status  11/13/2021 03:28 AM 4.6 (L) 4.8 - 5.6 % Final    Comment:    (NOTE) Pre diabetes:          5.7%-6.4%  Diabetes:              >6.4%  Glycemic control for   <7.0% adults with diabetes   08/10/2014 01:00 AM 4.6 <5.7 % Final    Comment:    (NOTE)                                                                       According to the ADA Clinical Practice Recommendations for 2011, when HbA1c is used as a screening test:  >=6.5%   Diagnostic of Diabetes Mellitus           (if abnormal result is confirmed) 5.7-6.4%   Increased risk of developing Diabetes Mellitus References:Diagnosis and Classification of Diabetes Mellitus,Diabetes [id number] 1):S62-S69 and Standards of Medical Care in         Diabetes - 2011,Diabetes [id number] (Suppl 1):S11-S61.     CBG: Recent Labs  Lab 11/14/21 1156  11/14/21 1508 11/14/21 1925 11/14/21 2311 11/15/21 0315  GLUCAP 150* 177* 193* 169* 198*  Review of Systems:     Past Medical History:  She,  has a past medical history of Alcohol abuse, Allergic rhinitis, seasonal, Dyspnea, Eczema, GERD (gastroesophageal reflux disease), History of pulmonary embolus (PE) (08/09/2014), History of trichomoniasis, Left ovarian cyst, Mild intermittent asthma, Obesity (BMI 30-39.9), Pelvic adhesive disease, and Pneumonia.   Surgical History:   Past Surgical History:  Procedure Laterality Date   ABDOMINAL HYSTERECTOMY     LAPAROSCOPIC OVARIAN CYSTECTOMY Left 06/05/2020   Procedure: LAPAROSCOPIC OVARIAN CYSTECTOMY;  Surgeon: Aletha Halim, MD;  Location: [place];  Service: Gynecology;  Laterality: Left;   NO PAST SURGERIES     ROBOTIC ASSISTED TOTAL HYSTERECTOMY WITH BILATERAL SALPINGO OOPHERECTOMY N/A 07/03/2020   Procedure: XI ROBOTIC ASSISTED  LEFT  SALPINGO OOPHORECTOMY/ LAPAROSCOPIC LYSIS OF ADHESIONS/LEFT URETEROLYSIS;  Surgeon: Everitt Amber, MD;  Location: [facility];  Service: Gynecology;  Laterality: N/A;     Social History:   reports that she has been smoking cigarettes. She has a 6.00 pack-year smoking history. She has never used smokeless tobacco. She reports current alcohol use. She reports current drug use. Drug: Marijuana.   Family History:  Her family history includes Asthma in her maternal grandfather; Healthy in her father and mother; Hypertension in her maternal grandfather; Other in an other family member.   Allergies Allergies  Allergen Reactions   Fish Allergy Hives, Shortness Of Breath and Swelling    Mostly tilapia     Home Medications  Prior to Admission medications   Medication Sig Start Date End Date Taking? Authorizing Provider  acetaminophen (TYLENOL) 500 MG tablet Take 500 mg by mouth every 6 (six) hours as needed for mild pain or moderate pain.   Yes [provider]  albuterol (VENTOLIN  HFA) 108 (90 Base) MCG/ACT inhaler Inhale 2 puffs into the lungs every 6 (six) hours as needed for wheezing or shortness of breath.    Yes [provider]  ipratropium (ATROVENT HFA) 17 MCG/ACT inhaler Inhale 2 puffs into the lungs every 4 (four) hours as needed for wheezing.   Yes [provider]  cetirizine (ZYRTEC ALLERGY) 10 MG tablet Take 1 tablet (10 mg total) by mouth at bedtime. Patient not taking: Reported on 11/10/2021 06/09/21   Tedd Sias, PA  fluticasone (FLOVENT HFA) 44 MCG/ACT inhaler Inhale 1 puff into the lungs daily. Patient not taking: Reported on 11/10/2021 06/09/21   Tedd Sias, PA  pantoprazole (PROTONIX) 40 MG tablet Take 1 tablet (40 mg total) by mouth daily. Patient not taking: Reported on 05/27/2021 11/02/20   Katy Apo, NP  predniSONE (DELTASONE) 20 MG tablet Take 3 tablets (60 mg total) by mouth daily for 5 days. 11/10/21 11/15/21  Lennice Sites, DO     Critical care time:     Gaylan Gerold, DO

## 2021-11-15 NOTE — Progress Notes (Signed)
Patient received 2mg  of Ativan per verbal order given by  Dr. who was at bedside. Pulled ativan from patient PRN order 1mg , no witness at the time so the full 2 mg was brought to the bedside. At that time verbal order was given and patient received the full dose.  ?

## 2021-11-15 NOTE — Progress Notes (Signed)
Verbal order given for 2 mg Ativan IV this morning at 0950.  ?

## 2021-11-15 NOTE — Progress Notes (Signed)
Patient CBG trending up, spoke to attending concerning last two CBG over 200's. No new orders given and will revaluate in the am. ?

## 2021-11-15 NOTE — Progress Notes (Signed)
eLink Physician-Brief Progress Note ?Patient Name: Kara Haynes ?DOB: 07-04-1991 ?MRN: [id number] ? ? ?Date of Service ? 11/15/2021  ?HPI/Events of Note ? Respiratory acidosis with metabolic compensation. Patient with no prior hx of chronic hypercapnia  ?eICU Interventions ? Increase RR 22. Tolerating with Pplat 20 ?  ? ? ? ?Intervention Category ?Major Interventions: Hypercarbia - evaluation and management ? ?Kara Haynes ?11/15/2021, 5:16 AM ?

## 2021-11-15 NOTE — Plan of Care (Signed)
?  Problem: Education: ?Goal: Knowledge of General Education information will improve ?Description: Including pain rating scale, medication(s)/side effects and non-pharmacologic comfort measures ?11/15/2021 0016 by Caswell Corwin, RN ?Outcome: Progressing ?11/14/2021 2239 by Caswell Corwin, RN ?Outcome: Progressing ?  ?Problem: Health Behavior/Discharge Planning: ?Goal: Ability to manage health-related needs will improve ?11/15/2021 0016 by Caswell Corwin, RN ?Outcome: Progressing ?11/14/2021 2239 by Caswell Corwin, RN ?Outcome: Progressing ?  ?Problem: Clinical Measurements: ?Goal: Ability to maintain clinical measurements within normal limits will improve ?11/15/2021 0016 by Caswell Corwin, RN ?Outcome: Progressing ?11/14/2021 2239 by Caswell Corwin, RN ?Outcome: Progressing ?Goal: Will remain free from infection ?11/15/2021 0016 by Caswell Corwin, RN ?Outcome: Progressing ?11/14/2021 2239 by Caswell Corwin, RN ?Outcome: Progressing ?Goal: Diagnostic test results will improve ?11/15/2021 0016 by Caswell Corwin, RN ?Outcome: Progressing ?11/14/2021 2239 by Caswell Corwin, RN ?Outcome: Progressing ?Goal: Respiratory complications will improve ?11/15/2021 0016 by Caswell Corwin, RN ?Outcome: Progressing ?11/14/2021 2239 by Caswell Corwin, RN ?Outcome: Progressing ?Goal: Cardiovascular complication will be avoided ?11/15/2021 0016 by Caswell Corwin, RN ?Outcome: Progressing ?11/14/2021 2239 by Caswell Corwin, RN ?Outcome: Progressing ?  ?Problem: Activity: ?Goal: Risk for activity intolerance will decrease ?11/15/2021 0016 by Caswell Corwin, RN ?Outcome: Progressing ?11/14/2021 2239 by Caswell Corwin, RN ?Outcome: Progressing ?  ?Problem: Nutrition: ?Goal: Adequate nutrition will be maintained ?11/15/2021 0016 by Caswell Corwin, RN ?Outcome: Progressing ?11/14/2021 2239 by Caswell Corwin, RN ?Outcome: Progressing ?  ?Problem: Coping: ?Goal: Level of anxiety will decrease ?11/15/2021 0016 by Caswell Corwin, RN ?Outcome: Progressing ?11/14/2021 2239 by Caswell Corwin, RN ?Outcome: Progressing ?   ?Problem: Elimination: ?Goal: Will not experience complications related to bowel motility ?11/15/2021 0016 by Caswell Corwin, RN ?Outcome: Progressing ?11/14/2021 2239 by Caswell Corwin, RN ?Outcome: Progressing ?Goal: Will not experience complications related to urinary retention ?11/15/2021 0016 by Caswell Corwin, RN ?Outcome: Progressing ?11/14/2021 2239 by Caswell Corwin, RN ?Outcome: Progressing ?  ?Problem: Pain Managment: ?Goal: General experience of comfort will improve ?11/15/2021 0016 by Caswell Corwin, RN ?Outcome: Progressing ?11/14/2021 2239 by Caswell Corwin, RN ?Outcome: Progressing ?  ?Problem: Safety: ?Goal: Ability to remain free from injury will improve ?11/15/2021 0016 by Caswell Corwin, RN ?Outcome: Progressing ?11/14/2021 2239 by Caswell Corwin, RN ?Outcome: Progressing ?  ?Problem: Skin Integrity: ?Goal: Risk for impaired skin integrity will decrease ?11/15/2021 0016 by Caswell Corwin, RN ?Outcome: Progressing ?11/14/2021 2239 by Caswell Corwin, RN ?Outcome: Progressing ?  ?

## 2021-11-16 ENCOUNTER — Inpatient Hospital Stay (HOSPITAL_COMMUNITY): Payer: Self-pay

## 2021-11-16 LAB — BASIC METABOLIC PANEL
Anion gap: 7 (ref 5–15)
BUN: 12 mg/dL (ref 6–20)
CO2: 34 mmol/L — ABNORMAL HIGH (ref 22–32)
Calcium: 8.2 mg/dL — ABNORMAL LOW (ref 8.9–10.3)
Chloride: 92 mmol/L — ABNORMAL LOW (ref 98–111)
Creatinine, Ser: 0.52 mg/dL (ref 0.44–1.00)
GFR, Estimated: >60 mL/min (ref >60)
Glucose, Bld: 212 mg/dL — ABNORMAL HIGH (ref 70–99)
Potassium: 4.6 mmol/L (ref 3.5–5.1)
Sodium: 133 mmol/L — ABNORMAL LOW (ref 135–145)

## 2021-11-16 LAB — GLUCOSE, CAPILLARY
Glucose-Capillary: 127 mg/dL — ABNORMAL HIGH (ref 70–99)
Glucose-Capillary: 140 mg/dL — ABNORMAL HIGH (ref 70–99)
Glucose-Capillary: 160 mg/dL — ABNORMAL HIGH (ref 70–99)
Glucose-Capillary: 197 mg/dL — ABNORMAL HIGH (ref 70–99)
Glucose-Capillary: 212 mg/dL — ABNORMAL HIGH (ref 70–99)
Glucose-Capillary: 236 mg/dL — ABNORMAL HIGH (ref 70–99)
Glucose-Capillary: 257 mg/dL — ABNORMAL HIGH (ref 70–99)

## 2021-11-16 LAB — CBC
HCT: 35.1 % — ABNORMAL LOW (ref 36.0–46.0)
Hemoglobin: 11.4 g/dL — ABNORMAL LOW (ref 12.0–15.0)
MCH: 33.1 pg (ref 26.0–34.0)
MCHC: 32.5 g/dL (ref 30.0–36.0)
MCV: 102 fL — ABNORMAL HIGH (ref 80.0–100.0)
Platelets: 304 10*3/uL (ref 150–400)
RBC: 3.44 MIL/uL — ABNORMAL LOW (ref 3.87–5.11)
RDW: 14.6 % (ref 11.5–15.5)
WBC: 11.8 10*3/uL — ABNORMAL HIGH (ref 4.0–10.5)
nRBC: 0 % (ref 0.0–0.2)

## 2021-11-16 LAB — POCT I-STAT 7, (LYTES, BLD GAS, ICA,H+H)
Acid-Base Excess: 12 mmol/L — ABNORMAL HIGH (ref 0.0–2.0)
Bicarbonate: 40 mmol/L — ABNORMAL HIGH (ref 20.0–28.0)
Calcium, Ion: 1.17 mmol/L (ref 1.15–1.40)
HCT: 42 % (ref 36.0–46.0)
Hemoglobin: 14.3 g/dL (ref 12.0–15.0)
O2 Saturation: 95 %
Patient temperature: 99.4
Potassium: 5.7 mmol/L — ABNORMAL HIGH (ref 3.5–5.1)
Sodium: 130 mmol/L — ABNORMAL LOW (ref 135–145)
TCO2: 42 mmol/L — ABNORMAL HIGH (ref 22–32)
pCO2 arterial: 67 mmHg (ref 32–48)
pH, Arterial: 7.386 (ref 7.35–7.45)
pO2, Arterial: 83 mmHg (ref 83–108)

## 2021-11-16 LAB — PHOSPHORUS: Phosphorus: 4.4 mg/dL (ref 2.5–4.6)

## 2021-11-16 LAB — MAGNESIUM: Magnesium: 2.4 mg/dL (ref 1.7–2.4)

## 2021-11-16 MED ORDER — LORAZEPAM 2 MG/ML IJ SOLN
INTRAMUSCULAR | Status: AC
  Administered 2021-11-16: 2 mg via INTRAVENOUS
  Filled 2021-11-16: qty 1

## 2021-11-16 MED ORDER — FUROSEMIDE 10 MG/ML IJ SOLN
20.0000 mg | Freq: Once | INTRAMUSCULAR | Status: AC
  Administered 2021-11-16: 20 mg via INTRAVENOUS
  Filled 2021-11-16: qty 2

## 2021-11-16 MED ORDER — METHYLPREDNISOLONE SODIUM SUCC 125 MG IJ SOLR
80.0000 mg | Freq: Two times a day (BID) | INTRAMUSCULAR | Status: DC
  Administered 2021-11-16 – 2021-11-17 (×2): 80 mg via INTRAVENOUS
  Filled 2021-11-16 (×2): qty 2

## 2021-11-16 MED ORDER — DOCUSATE SODIUM 50 MG/5ML PO LIQD: 100.0000 mg | Freq: Two times a day (BID) | ORAL | Status: DC

## 2021-11-16 MED ORDER — DEXMEDETOMIDINE HCL IN NACL 400 MCG/100ML IV SOLN
0.0000 ug/kg/h | INTRAVENOUS | Status: DC
  Administered 2021-11-16: 1 ug/kg/h via INTRAVENOUS
  Administered 2021-11-16 – 2021-11-17 (×5): 1.2 ug/kg/h via INTRAVENOUS
  Filled 2021-11-16 (×3): qty 100
  Filled 2021-11-16: qty 200

## 2021-11-16 MED ORDER — DEXMEDETOMIDINE HCL IN NACL 400 MCG/100ML IV SOLN
INTRAVENOUS | Status: AC
  Filled 2021-11-16: qty 100

## 2021-11-16 MED ORDER — POLYETHYLENE GLYCOL 3350 17 G PO PACK: 17.0000 g | PACK | Freq: Every day | ORAL | Status: DC

## 2021-11-16 MED ORDER — ACETAZOLAMIDE 250 MG PO TABS
250.0000 mg | ORAL_TABLET | Freq: Two times a day (BID) | ORAL | Status: DC
  Administered 2021-11-16 (×2): 250 mg
  Filled 2021-11-16 (×3): qty 1

## 2021-11-16 MED ORDER — LORAZEPAM 2 MG/ML IJ SOLN
2.0000 mg | Freq: Once | INTRAMUSCULAR | Status: AC
  Administered 2021-11-16: 2 mg via INTRAVENOUS

## 2021-11-16 NOTE — Progress Notes (Signed)
Per CCM MD, patient was placed on PS/CPAP due to patient fighting the ventilator and having high peak pressure on full support. MD notified of high vital signs, other than RR, and okay to leave on PS/CPAP at this time. RT will continue to monitor.  ?

## 2021-11-16 NOTE — Progress Notes (Signed)
NAME:  Kara Haynes, MRN:  [id number], DOB:  1990/09/16, LOS: 5 ADMISSION DATE:  11/10/2021, CONSULTATION DATE:  11/11/21 REFERRING MD:  Dr. Alvino Chapel , CHIEF COMPLAINT:  Asthma exacerbation  History of Present Illness:  Kara Haynes is a 31 y.o. female with a PMH significant for Asthma, Prior PE in 2015, GERD, obesity, and alcohol abuse who presented to the ED for  respiratory distress felt secondary to an asthma exacerbation that started 3 days prior to admission. Patient attempted to use home rescue inhaler with no relief in dyspnea. Of note patient was seen at [facility] ED for similar symptoms early this am but was felt safe to discharge from ED.   On ED presentation patient was seen ill appearing with fever, tachycardia, and tachypnea.  Concern for acute asthma exacerbation possibly next in the setting of viral illness.  Lab work significant for glucose 148, albumin 3.3, lactic acid 3.0 with downtrend to 1.8.  Given persistent tachycardia tachypnea and severe bronchospasm patient was placed on BiPAP and critical care/pulmonary was consulted.  Pertinent  Medical History  Asthma Prior PE in 2015 GERD Alcohol abuse  Significant Hospital Events: Including procedures, antibiotic start and stop dates in addition to other pertinent events   2/27 presented to [facility], ED for complaints of chest tightness and dyspnea found to be in severe asthma exacerbation 2/28 intubated overnight, acutely hypoxic and hypercarbic this AM, sedated and paralyzed 3/1 improving ABG this AM, plan to supinate  Interim History / Subjective:  Remains deeply sedated and paralyzed On high-dose propofol and fentanyl with as needed Versed She was afebrile  Objective   Blood pressure (!) 201/117, pulse (!) 112, temperature 98 F (36.7 C), temperature source Axillary, resp. rate (!) 22, height 5\' 2"  (1.575 m), weight 81.6 kg, last menstrual period 11/07/2021, SpO2 94 %.    Vent Mode: PRVC FiO2 (%):   [30 %] 30 % Set Rate:  [22 bmp] 22 bmp Vt Set:  [450 mL] 450 mL PEEP:  [5 cmH20] 5 cmH20 Plateau Pressure:  [19 cmH20-21 cmH20] 20 cmH20   Intake/Output Summary (Last 24 hours) at 11/16/2021 0817 Last data filed at 11/16/2021 0700 Gross per 24 hour  Intake 3159.86 ml  Output 4125 ml  Net -965.14 ml   Filed Weights   11/10/21 1546  Weight: 81.6 kg      Physical exam: General: Crtitically ill-appearing young obese female, orally intubated HEENT: [place]/AT, eyes anicteric.  ETT and OGT in place Neuro: Sedated, not following commands.  Eyes are closed.  Pupils 3 mm bilateral reactive to light Chest: Bilateral rhonchi left more than right Heart: Regular rate and rhythm, no murmurs or gallops Abdomen: Soft, nontender, nondistended, bowel sounds present Skin: No rash    Resolved Hospital Problem list     Assessment & Plan:  Status asthmaticus with with acute hypoxic and hypercarbic respiratory failure  Community-acquired/aspiration pneumonia Acute respiratory acidosis with metabolic compensation Patient remained on cisatracurium for almost 48 hours We will try to stop it today He still remained dyssynchronous with the vent and deep sedation with RASS goal -5 After we stop paralytic, will try to come down on sedation with RASS goal -2/-3 Plateau and driving pressure remained stable at goal PEEP pressures remain elevated up to 34  Continue Unasyn to complete 5 days therapy Decrease Solu-Medrol to 40 mg every 6 hours Continue nebs Bronchial hygiene and RT/bronchodilator protocol.  Alcohol use disorder Last alcoholic consumption 2/25. Consume 12 shots a day. CIWA of  0 last night.  Continue phenobarbital Continue CIWA scale Supplement thiamine, folic acid, and multivitamin   Hyperglycemia, steroid-induced Monitor CBG for worsening hyperglycemia in setting of steroid use CBG goal 140-180 Continue SSI  Hyponatremia Due to hyperglycemia in the setting of steroid Closely  monitor  Obesity Continue tube feeds Nutritionist is following  Best Practice (right click and "Reselect all SmartList Selections" daily)   Diet/type: tubefeeds DVT prophylaxis: LMWH GI prophylaxis: PPI Lines: no central line Foley:  N/A Code Status:  full code  Labs   CBC: Recent Labs  Lab 11/10/21 1517 11/11/21 0453 11/11/21 2036 11/12/21 0135 11/12/21 0345 11/13/21 0328 11/13/21 0457 11/15/21 0316 11/15/21 0502 11/15/21 0838 11/15/21 1236 11/16/21 0259  WBC 6.0 6.9  --  8.0  --  9.4  --  12.7*  --   --   --  11.8*  NEUTROABS 5.6  --   --  7.3  --   --   --   --   --   --   --   --   HGB 13.9 12.3   < > 12.7   < > 11.6*   < > 11.2* 11.9* 11.9* 11.9* 11.4*  HCT 40.8 34.5*   < > 38.0   < > 35.1*   < > 34.4* 35.0* 35.0* 35.0* 35.1*  MCV 99.0 97.7  --  100.0  --  102.3*  --  105.2*  --   --   --  102.0*  PLT 273 244  --  233  --  253  --  280  --   --   --  304   < > = values in this interval not displayed.    Basic Metabolic Panel: Recent Labs  Lab 11/12/21 0135 11/12/21 0345 11/13/21 0328 11/13/21 0457 11/13/21 1710 11/13/21 2328 11/14/21 0335 11/14/21 1033 11/15/21 0316 11/15/21 0502 11/15/21 0838 11/15/21 1236 11/16/21 0259  NA 133*   < > 139   < >  --    < > 141   < > 137 135 134* 133* 133*  K 4.1   < > 3.6   < >  --    < > 4.8   < > 4.8 4.9 4.9 4.5 4.6  CL 101  --  105  --   --   --  100  --  96*  --   --   --  92*  CO2 21*  --  26  --   --   --  33*  --  36*  --   --   --  34*  GLUCOSE 188*  --  192*  --   --   --  163*  --  200*  --   --   --  212*  BUN 5*  --  6  --   --   --  7  --  14  --   --   --  12  CREATININE 0.81  --  0.86  --   --   --  0.80  --  0.64  --   --   --  0.52  CALCIUM 8.7*  --  8.6*  --   --   --  8.6*  --  8.5*  --   --   --  8.2*  MG  --    < > 2.5*  --  2.3  --  2.4  --  2.5*  --   --   --  2.4  PHOS  --    < > 1.9*  --  4.1  --  3.0  --  4.1  --   --   --  4.4   < > = values in this interval not displayed.    GFR: Estimated Creatinine Clearance: 100.9 mL/min (by C-G formula based on SCr of 0.52 mg/dL). Recent Labs  Lab 11/10/21 1517 11/10/21 1802 11/10/21 2042 11/10/21 2305 11/11/21 0453 11/12/21 0135 11/13/21 0328 11/15/21 0316 11/16/21 0259  PROCALCITON  --   --  <0.10  --  <0.10  --  <0.10  --   --   WBC 6.0  --   --   --  6.9 8.0 9.4 12.7* 11.8*  LATICACIDVEN 3.4* 3.0*  --  1.8  --   --   --   --   --     Liver Function Tests: Recent Labs  Lab 11/10/21 1517 11/11/21 0453 11/12/21 0135  AST 59* 35 53*  ALT 28 21 27   ALKPHOS 65 47 51  BILITOT 0.7 0.6 0.6  PROT 7.2 5.9* 6.3*  ALBUMIN 3.9 3.3* 3.2*   No results for input(s): LIPASE, AMYLASE in the last 168 hours. No results for input(s): AMMONIA in the last 168 hours.  ABG    Component Value Date/Time   PHART 7.385 11/15/2021 1236   PCO2ART 62.2 (H) 11/15/2021 1236   PO2ART 88 11/15/2021 1236   HCO3 37.3 (H) 11/15/2021 1236   TCO2 39 (H) 11/15/2021 1236   ACIDBASEDEF 7.0 (H) 11/12/2021 1953   O2SAT 96 11/15/2021 1236     Coagulation Profile: Recent Labs  Lab 11/10/21 1530  INR 0.9    Cardiac Enzymes: No results for input(s): CKTOTAL, CKMB, CKMBINDEX, TROPONINI in the last 168 hours.  HbA1C: Hgb A1c MFr Bld  Date/Time Value Ref Range Status  11/13/2021 03:28 AM 4.6 (L) 4.8 - 5.6 % Final    Comment:    (NOTE) Pre diabetes:          5.7%-6.4%  Diabetes:              >6.4%  Glycemic control for   <7.0% adults with diabetes   08/10/2014 01:00 AM 4.6 <5.7 % Final    Comment:    (NOTE)                                                                       According to the ADA Clinical Practice Recommendations for 2011, when HbA1c is used as a screening test:  >=6.5%   Diagnostic of Diabetes Mellitus           (if abnormal result is confirmed) 5.7-6.4%   Increased risk of developing Diabetes Mellitus References:Diagnosis and Classification of Diabetes Mellitus,Diabetes Care,2011,34(Suppl 1):S62-S69  and Standards of Medical Care in         Diabetes - 2011,Diabetes Care,2011,34 (Suppl 1):S11-S61.     CBG: Recent Labs  Lab 11/15/21 1917 11/15/21 2159 11/15/21 2342 11/16/21 0314 11/16/21 0718  GLUCAP 194* 192* 170* 212* 236*    Critical care time:     Total critical care time: 36 minutes  Performed by: 01/16/22   Critical care time was exclusive of separately billable procedures and treating other patients.  Critical care was necessary to treat or prevent imminent or life-threatening deterioration.   Critical care was time spent personally by me on the following activities: development of treatment plan with patient and/or surrogate as well as nursing, discussions with consultants, evaluation of patient's response to treatment, examination of patient, obtaining history from patient or surrogate, ordering and performing treatments and interventions, ordering and review of laboratory studies, ordering and review of radiographic studies, pulse oximetry and re-evaluation of patient's condition.   Cheri FowlerSudham Shamica Moree MD [facility] See Amion for pager If no response to pager, please call [phone] until 7pm After 7pm, Please call E-link [phone]

## 2021-11-17 LAB — BASIC METABOLIC PANEL
Anion gap: 11 (ref 5–15)
BUN: 21 mg/dL — ABNORMAL HIGH (ref 6–20)
CO2: 25 mmol/L (ref 22–32)
Calcium: 8.7 mg/dL — ABNORMAL LOW (ref 8.9–10.3)
Chloride: 96 mmol/L — ABNORMAL LOW (ref 98–111)
Creatinine, Ser: 0.7 mg/dL (ref 0.44–1.00)
GFR, Estimated: >60 mL/min (ref >60)
Glucose, Bld: 203 mg/dL — ABNORMAL HIGH (ref 70–99)
Potassium: 4.1 mmol/L (ref 3.5–5.1)
Sodium: 132 mmol/L — ABNORMAL LOW (ref 135–145)

## 2021-11-17 LAB — CBC
HCT: 37.8 % (ref 36.0–46.0)
Hemoglobin: 13.1 g/dL (ref 12.0–15.0)
MCH: 34.4 pg — ABNORMAL HIGH (ref 26.0–34.0)
MCHC: 34.7 g/dL (ref 30.0–36.0)
MCV: 99.2 fL (ref 80.0–100.0)
Platelets: 337 10*3/uL (ref 150–400)
RBC: 3.81 MIL/uL — ABNORMAL LOW (ref 3.87–5.11)
RDW: 14.6 % (ref 11.5–15.5)
WBC: 14.7 10*3/uL — ABNORMAL HIGH (ref 4.0–10.5)
nRBC: 0 % (ref 0.0–0.2)

## 2021-11-17 LAB — GLUCOSE, CAPILLARY
Glucose-Capillary: 175 mg/dL — ABNORMAL HIGH (ref 70–99)
Glucose-Capillary: 223 mg/dL — ABNORMAL HIGH (ref 70–99)
Glucose-Capillary: 72 mg/dL (ref 70–99)
Glucose-Capillary: 120 mg/dL — ABNORMAL HIGH (ref 70–99)
Glucose-Capillary: 132 mg/dL — ABNORMAL HIGH (ref 70–99)
Glucose-Capillary: 127 mg/dL — ABNORMAL HIGH (ref 70–99)

## 2021-11-17 LAB — TRIGLYCERIDES: Triglycerides: 86 mg/dL (ref <150)

## 2021-11-17 LAB — PHOSPHORUS: Phosphorus: 4.4 mg/dL (ref 2.5–4.6)

## 2021-11-17 LAB — MAGNESIUM: Magnesium: 2.3 mg/dL (ref 1.7–2.4)

## 2021-11-17 MED ORDER — INSULIN GLARGINE-YFGN 100 UNIT/ML ~~LOC~~ SOLN
10.0000 [IU] | Freq: Two times a day (BID) | SUBCUTANEOUS | Status: DC
  Administered 2021-11-17 (×2): 10 [IU] via SUBCUTANEOUS
  Filled 2021-11-17 (×4): qty 0.1

## 2021-11-17 MED ORDER — ORAL CARE MOUTH RINSE
15.0000 mL | Freq: Two times a day (BID) | OROMUCOSAL | Status: DC
  Administered 2021-11-17 – 2021-11-19 (×4): 15 mL via OROMUCOSAL

## 2021-11-17 MED ORDER — METHYLPREDNISOLONE SODIUM SUCC 125 MG IJ SOLR
120.0000 mg | INTRAMUSCULAR | Status: DC
  Administered 2021-11-17: 120 mg via INTRAVENOUS
  Filled 2021-11-17: qty 2

## 2021-11-17 MED ORDER — IPRATROPIUM-ALBUTEROL 0.5-2.5 (3) MG/3ML IN SOLN
3.0000 mL | Freq: Four times a day (QID) | RESPIRATORY_TRACT | Status: DC
  Administered 2021-11-17 – 2021-11-18 (×4): 3 mL via RESPIRATORY_TRACT
  Filled 2021-11-17 (×4): qty 3

## 2021-11-17 NOTE — Progress Notes (Addendum)
NAME:  Kara Haynes, MRN:  [id number], DOB:  28-May-1991, LOS: 6 ADMISSION DATE:  11/10/2021, CONSULTATION DATE:  11/11/21 REFERRING MD:  Dr. Alvino Chapelhoi , CHIEF COMPLAINT:  Asthma exacerbation  History of Present Illness:  Kara Haynes is a 31 y.o. female with a PMH significant for Asthma, Prior PE in 2015, GERD, obesity, and alcohol abuse who presented to the ED for  respiratory distress felt secondary to an asthma exacerbation that started 3 days prior to admission. Patient attempted to use home rescue inhaler with no relief in dyspnea. Of note [facility] symptoms early this am but was felt safe to discharge from ED.   On ED presentation patient was seen ill appearing with fever, tachycardia, and tachypnea.  Concern for acute asthma exacerbation possibly next in the setting of viral illness.  Lab work significant for glucose 148, albumin 3.3, lactic acid 3.0 with downtrend to 1.8.  Given persistent tachycardia tachypnea and severe bronchospasm patient was placed on BiPAP and critical care/pulmonary was consulted.  Pertinent  Medical History  Asthma Prior PE in 2015 GERD Alcohol abuse  Significant Hospital Events: Including procedures, antibiotic start and stop dates in addition to other pertinent events   [facility] of chest tightness and dyspnea found to be in severe asthma exacerbation 2/28 intubated overnight, acutely hypoxic and hypercarbic this AM, sedated and paralyzed 3/1 improving ABG this AM, plan to supinate  Interim History / Subjective:  Paralytics tolerated yesterday along with propofol Patient remains on Precedex infusion and fentanyl Tolerating pressure support trial Remained afebrile  Objective   Blood pressure 136/87, pulse (!) 124, temperature 99.5 F (37.5 C), temperature source Axillary, resp. rate (!) 31, height 5\' 2"  (1.575 m), weight 81.6 kg, last menstrual period 11/07/2021,  SpO2 97 %.    Vent Mode: PSV FiO2 (%):  [30 %] 30 % Set Rate:  [22 bmp] 22 bmp Vt Set:  [450 mL] 450 mL PEEP:  [5 cmH20] 5 cmH20 Pressure Support:  [8 cmH20] 8 cmH20 Plateau Pressure:  [14 cmH20-24 cmH20] 14 cmH20   Intake/Output Summary (Last 24 hours) at 11/17/2021 0752 Last data filed at 11/17/2021 0500 Gross per 24 hour  Intake 2793.16 ml  Output 4630 ml  Net -1836.84 ml   Filed Weights   11/10/21 1546  Weight: 81.6 kg      Physical exam: General: Crtitically ill-appearing young obese female, orally intubated HEENT: [place]/AT, eyes anicteric.  ETT and OGT in place Neuro: Awake, intermittently following simple commands, wiggling her toes Chest: Bilateral rhonchi left more than right, no wheezes or crackles Heart: Tachycardic, regular rhythm, no murmurs or gallops Abdomen: Soft, nontender, nondistended, bowel sounds present Skin: No rash  Resolved Hospital Problem list     Assessment & Plan:  Status asthmaticus with with acute hypoxic and hypercarbic respiratory failure  Community-acquired/aspiration pneumonia Acute respiratory acidosis with metabolic compensation Patient is off paralytics and deep sedation Currently on Precedex low-dose with RASS goal 0/-1 Continue bronchodilators IV Solu-Medrol was decreased to 40 mg every 8 hours Patient is tolerating spontaneous breathing trial, will try to extubate her today, she may remain on BiPAP to help work of breathing Patient completed 5 days therapy with Unasyn for pneumonia Cultures have been negative so far  Alcohol use disorder Last alcoholic consumption 2/25. Consume 12 shots a day. Continue phenobarbital protocol Continue CIWA scale Supplement thiamine, folic acid, and multivitamin   Hyperglycemia, steroid-induced Monitor CBG for  worsening hyperglycemia in setting of steroid use CBG goal 140-180 Continue SSI Started on long-acting insulin 10 units twice daily  Hyponatremia Due to hyperglycemia in the setting of  steroid Serum sodium is improving now at 132 Closely monitor  Obesity Continue tube feeds Nutritionist is following  Best Practice (right click and "Reselect all SmartList Selections" daily)   Diet/type: tubefeeds DVT prophylaxis: LMWH GI prophylaxis: PPI Lines: no central line Foley:  N/A Code Status:  full code  Goals of care discussion: 3/4 patient's father was updated at bedside, decision was to continue full scope of care  Labs   CBC: Recent Labs  Lab 11/10/21 1517 11/11/21 0453 11/12/21 0135 11/12/21 0345 11/13/21 0328 11/13/21 0457 11/15/21 0316 11/15/21 0502 11/15/21 0838 11/15/21 1236 11/16/21 0259 11/16/21 1010 11/17/21 0504  WBC 6.0   < > 8.0  --  9.4  --  12.7*  --   --   --  11.8*  --  14.7*  NEUTROABS 5.6  --  7.3  --   --   --   --   --   --   --   --   --   --   HGB 13.9   < > 12.7   < > 11.6*   < > 11.2*   < > 11.9* 11.9* 11.4* 14.3 13.1  HCT 40.8   < > 38.0   < > 35.1*   < > 34.4*   < > 35.0* 35.0* 35.1* 42.0 37.8  MCV 99.0   < > 100.0  --  102.3*  --  105.2*  --   --   --  102.0*  --  99.2  PLT 273   < > 233  --  253  --  280  --   --   --  304  --  337   < > = values in this interval not displayed.    Basic Metabolic Panel: Recent Labs  Lab 11/13/21 0328 11/13/21 0457 11/13/21 1710 11/13/21 2328 11/14/21 0335 11/14/21 1033 11/15/21 0316 11/15/21 0502 11/15/21 0838 11/15/21 1236 11/16/21 0259 11/16/21 1010 11/17/21 0504  NA 139   < >  --    < > 141   < > 137   < > 134* 133* 133* 130* 132*  K 3.6   < >  --    < > 4.8   < > 4.8   < > 4.9 4.5 4.6 5.7* 4.1  CL 105  --   --   --  100  --  96*  --   --   --  92*  --  96*  CO2 26  --   --   --  33*  --  36*  --   --   --  34*  --  25  GLUCOSE 192*  --   --   --  163*  --  200*  --   --   --  212*  --  203*  BUN 6  --   --   --  7  --  14  --   --   --  12  --  21*  CREATININE 0.86  --   --   --  0.80  --  0.64  --   --   --  0.52  --  0.70  CALCIUM 8.6*  --   --   --  8.6*  --  8.5*  --    --   --  8.2*  --  8.7*  MG 2.5*  --  2.3  --  2.4  --  2.5*  --   --   --  2.4  --  2.3  PHOS 1.9*  --  4.1  --  3.0  --  4.1  --   --   --  4.4  --  4.4   < > = values in this interval not displayed.   GFR: Estimated Creatinine Clearance: 100.9 mL/min (by C-G formula based on SCr of 0.7 mg/dL). Recent Labs  Lab 11/10/21 1517 11/10/21 1802 11/10/21 2042 11/10/21 2305 11/11/21 0453 11/12/21 0135 11/13/21 0328 11/15/21 0316 11/16/21 0259 11/17/21 0504  PROCALCITON  --   --  <0.10  --  <0.10  --  <0.10  --   --   --   WBC 6.0  --   --   --  6.9   < > 9.4 12.7* 11.8* 14.7*  LATICACIDVEN 3.4* 3.0*  --  1.8  --   --   --   --   --   --    < > = values in this interval not displayed.    Liver Function Tests: Recent Labs  Lab 11/10/21 1517 11/11/21 0453 11/12/21 0135  AST 59* 35 53*  ALT 28 21 27   ALKPHOS 65 47 51  BILITOT 0.7 0.6 0.6  PROT 7.2 5.9* 6.3*  ALBUMIN 3.9 3.3* 3.2*   No results for input(s): LIPASE, AMYLASE in the last 168 hours. No results for input(s): AMMONIA in the last 168 hours.  ABG    Component Value Date/Time   PHART 7.386 11/16/2021 1010   PCO2ART 67.0 (HH) 11/16/2021 1010   PO2ART 83 11/16/2021 1010   HCO3 40.0 (H) 11/16/2021 1010   TCO2 42 (H) 11/16/2021 1010   ACIDBASEDEF 7.0 (H) 11/12/2021 1953   O2SAT 95 11/16/2021 1010     Coagulation Profile: Recent Labs  Lab 11/10/21 1530  INR 0.9    Cardiac Enzymes: No results for input(s): CKTOTAL, CKMB, CKMBINDEX, TROPONINI in the last 168 hours.  HbA1C: Hgb A1c MFr Bld  Date/Time Value Ref Range Status  11/13/2021 03:28 AM 4.6 (L) 4.8 - 5.6 % Final    Comment:    (NOTE) Pre diabetes:          5.7%-6.4%  Diabetes:              >6.4%  Glycemic control for   <7.0% adults with diabetes   08/10/2014 01:00 AM 4.6 <5.7 % Final    Comment:    (NOTE)                                                                       According to the ADA Clinical Practice Recommendations for 2011,  when HbA1c is used as a screening test:  >=6.5%   Diagnostic of Diabetes Mellitus           (if abnormal result is confirmed) 5.7-6.4%   Increased risk of developing Diabetes Mellitus References:Diagnosis and Classification of Diabetes Mellitus,Diabetes Care,2011,34(Suppl 1):S62-S69 and Standards of Medical Care in         Diabetes - 2011,Diabetes Care,2011,34 (Suppl 1):S11-S61.     CBG: Recent Labs  Lab 11/16/21 1930  11/16/21 2157 11/16/21 2332 11/17/21 0327 11/17/21 0720  GLUCAP 160* 197* 140* 175* 223*    Critical care time:     Total critical care time: 39 minutes  Performed by: Cheri Fowler   Critical care time was exclusive of separately billable procedures and treating other patients.   Critical care was necessary to treat or prevent imminent or life-threatening deterioration.   Critical care was time spent personally by me on the following activities: development of treatment plan with patient and/or surrogate as well as nursing, discussions with consultants, evaluation of patient's response to treatment, examination of patient, obtaining history from patient or surrogate, ordering and performing treatments and interventions, ordering and review of laboratory studies, ordering and review of radiographic studies, pulse oximetry and re-evaluation of patient's condition.   Cheri Fowler MD [facility] See Amion for pager If no response to pager, please call [phone] until 7pm After 7pm, Please call E-link [phone]

## 2021-11-17 NOTE — Procedures (Signed)
Extubation Procedure Note ? ?Patient Details:   ?Name: Kara Haynes ?DOB: 1991-08-22 ?MRN: [id number] ?  ?Airway Documentation:  ?  ?Vent end date: 11/17/21 Vent end time: 0940  ? ?Evaluation ? O2 sats: stable throughout ?Complications: No apparent complications ?Patient did tolerate procedure well. ?Bilateral Breath Sounds: Rhonchi, Expiratory wheezes, Inspiratory wheezes ?  ?Yes ? ?Patient extubated to RA at this time. Patient tolerated extubation well. Positive cuff leak prior to extubation. Patient able to speak. No stridor noted at this time.  ? ?Dawnya Grams A Tashonna Descoteaux ?11/17/2021, 9:57 AM ? ?

## 2021-11-18 ENCOUNTER — Other Ambulatory Visit (HOSPITAL_COMMUNITY): Payer: Self-pay

## 2021-11-18 ENCOUNTER — Telehealth: Payer: Self-pay | Admitting: Critical Care Medicine

## 2021-11-18 DIAGNOSIS — E871 Hypo-osmolality and hyponatremia: Secondary | ICD-10-CM

## 2021-11-18 LAB — GLUCOSE, CAPILLARY
Glucose-Capillary: 82 mg/dL (ref 70–99)
Glucose-Capillary: 130 mg/dL — ABNORMAL HIGH (ref 70–99)
Glucose-Capillary: 80 mg/dL (ref 70–99)
Glucose-Capillary: 119 mg/dL — ABNORMAL HIGH (ref 70–99)

## 2021-11-18 LAB — BASIC METABOLIC PANEL
Anion gap: 10 (ref 5–15)
BUN: 17 mg/dL (ref 6–20)
CO2: 24 mmol/L (ref 22–32)
Calcium: 8.6 mg/dL — ABNORMAL LOW (ref 8.9–10.3)
Chloride: 97 mmol/L — ABNORMAL LOW (ref 98–111)
Creatinine, Ser: 0.64 mg/dL (ref 0.44–1.00)
GFR, Estimated: >60 mL/min (ref >60)
Glucose, Bld: 92 mg/dL (ref 70–99)
Potassium: 3.4 mmol/L — ABNORMAL LOW (ref 3.5–5.1)
Sodium: 131 mmol/L — ABNORMAL LOW (ref 135–145)

## 2021-11-18 LAB — CBC
HCT: 36.3 % (ref 36.0–46.0)
Hemoglobin: 12.7 g/dL (ref 12.0–15.0)
MCH: 33.5 pg (ref 26.0–34.0)
MCHC: 35 g/dL (ref 30.0–36.0)
MCV: 95.8 fL (ref 80.0–100.0)
Platelets: 338 10*3/uL (ref 150–400)
RBC: 3.79 MIL/uL — ABNORMAL LOW (ref 3.87–5.11)
RDW: 14.2 % (ref 11.5–15.5)
WBC: 13.4 10*3/uL — ABNORMAL HIGH (ref 4.0–10.5)
nRBC: 0 % (ref 0.0–0.2)

## 2021-11-18 LAB — MAGNESIUM: Magnesium: 2.2 mg/dL (ref 1.7–2.4)

## 2021-11-18 LAB — PHOSPHORUS: Phosphorus: 4.4 mg/dL (ref 2.5–4.6)

## 2021-11-18 MED ORDER — MOMETASONE FURO-FORMOTEROL FUM 200-5 MCG/ACT IN AERO
2.0000 | INHALATION_SPRAY | Freq: Two times a day (BID) | RESPIRATORY_TRACT | Status: DC
  Administered 2021-11-18 – 2021-11-19 (×3): 2 via RESPIRATORY_TRACT
  Filled 2021-11-18: qty 8.8

## 2021-11-18 MED ORDER — THIAMINE HCL 100 MG PO TABS
100.0000 mg | ORAL_TABLET | Freq: Every day | ORAL | Status: DC
  Administered 2021-11-18 – 2021-11-19 (×2): 100 mg via ORAL
  Filled 2021-11-18 (×3): qty 1

## 2021-11-18 MED ORDER — ZINC SULFATE 220 (50 ZN) MG PO CAPS
220.0000 mg | ORAL_CAPSULE | Freq: Two times a day (BID) | ORAL | Status: DC
  Administered 2021-11-18 – 2021-11-19 (×3): 220 mg via ORAL
  Filled 2021-11-18 (×3): qty 1

## 2021-11-18 MED ORDER — PREDNISONE 20 MG PO TABS
20.0000 mg | ORAL_TABLET | Freq: Every day | ORAL | Status: DC
Start: 2021-11-25 — End: 2021-11-19

## 2021-11-18 MED ORDER — POTASSIUM CHLORIDE 20 MEQ PO PACK
40.0000 meq | PACK | Freq: Once | ORAL | Status: AC
  Administered 2021-11-18: 40 meq via ORAL
  Filled 2021-11-18: qty 2

## 2021-11-18 MED ORDER — PREDNISONE 10 MG PO TABS
60.0000 mg | ORAL_TABLET | Freq: Every day | ORAL | Status: AC
  Administered 2021-11-18 – 2021-11-19 (×2): 60 mg via ORAL
  Filled 2021-11-18: qty 3
  Filled 2021-11-18: qty 1

## 2021-11-18 MED ORDER — ADULT MULTIVITAMIN W/MINERALS CH
1.0000 | ORAL_TABLET | Freq: Every day | ORAL | Status: DC
  Administered 2021-11-18 – 2021-11-19 (×2): 1 via ORAL
  Filled 2021-11-18 (×2): qty 1

## 2021-11-18 MED ORDER — PREDNISONE 20 MG PO TABS: 40.0000 mg | ORAL_TABLET | Freq: Every day | ORAL | Status: DC

## 2021-11-18 MED ORDER — MONTELUKAST SODIUM 10 MG PO TABS
10.0000 mg | ORAL_TABLET | Freq: Every day | ORAL | Status: DC
  Administered 2021-11-18: 10 mg via ORAL
  Filled 2021-11-18: qty 1

## 2021-11-18 MED ORDER — ACETAMINOPHEN 325 MG PO TABS
650.0000 mg | ORAL_TABLET | Freq: Four times a day (QID) | ORAL | Status: DC | PRN
Start: 2021-11-18 — End: 2021-11-19

## 2021-11-18 MED ORDER — POLYETHYLENE GLYCOL 3350 17 G PO PACK: 17.0000 g | PACK | Freq: Every day | ORAL | Status: DC

## 2021-11-18 MED ORDER — LORATADINE 10 MG PO TABS
10.0000 mg | ORAL_TABLET | Freq: Every day | ORAL | Status: DC
Start: 2021-11-18 — End: 2021-11-19
  Administered 2021-11-18 – 2021-11-19 (×2): 10 mg via ORAL
  Filled 2021-11-18 (×2): qty 1

## 2021-11-18 MED ORDER — ENSURE ENLIVE PO LIQD
237.0000 mL | Freq: Two times a day (BID) | ORAL | Status: DC
  Administered 2021-11-18: 237 mL via ORAL

## 2021-11-18 MED ORDER — VITAMIN B-12 1000 MCG PO TABS
1000.0000 ug | ORAL_TABLET | Freq: Every day | ORAL | Status: DC
  Administered 2021-11-18 – 2021-11-19 (×2): 1000 ug via ORAL
  Filled 2021-11-18 (×3): qty 1

## 2021-11-18 MED ORDER — THIAMINE HCL 100 MG/ML IJ SOLN: 100.0000 mg | Freq: Every day | INTRAMUSCULAR | Status: DC

## 2021-11-18 MED ORDER — ACETAMINOPHEN 650 MG RE SUPP: 650.0000 mg | Freq: Four times a day (QID) | RECTAL | Status: DC | PRN

## 2021-11-18 MED ORDER — POLYETHYLENE GLYCOL 3350 17 G PO PACK: 17.0000 g | PACK | Freq: Every day | ORAL | Status: DC | PRN

## 2021-11-18 MED ORDER — FAMOTIDINE 20 MG PO TABS
20.0000 mg | ORAL_TABLET | Freq: Every day | ORAL | Status: DC
  Administered 2021-11-18 – 2021-11-19 (×2): 20 mg via ORAL
  Filled 2021-11-18 (×4): qty 1

## 2021-11-18 MED ORDER — AMLODIPINE BESYLATE 5 MG PO TABS
5.0000 mg | ORAL_TABLET | Freq: Every day | ORAL | Status: DC
  Administered 2021-11-19: 5 mg via ORAL
  Filled 2021-11-18: qty 1

## 2021-11-18 MED ORDER — FOLIC ACID 1 MG PO TABS
1.0000 mg | ORAL_TABLET | Freq: Every day | ORAL | Status: DC
Start: 2021-11-18 — End: 2021-11-19
  Administered 2021-11-18 – 2021-11-19 (×2): 1 mg via ORAL
  Filled 2021-11-18 (×2): qty 1

## 2021-11-18 MED ORDER — AMLODIPINE BESYLATE 5 MG PO TABS
5.0000 mg | ORAL_TABLET | Freq: Once | ORAL | Status: AC
  Administered 2021-11-18: 5 mg via ORAL
  Filled 2021-11-18: qty 1

## 2021-11-18 MED ORDER — POTASSIUM CHLORIDE 10 MEQ/100ML IV SOLN: 10.0000 meq | INTRAVENOUS | Status: DC

## 2021-11-18 NOTE — Evaluation (Signed)
Occupational Therapy Evaluation Patient Details Name: Kara Haynes MRN: [id number] DOB: Dec 29, 1990 Today's Date: 11/18/2021   History of Present Illness 31 y.o. female presented to the ED 11/11/21 for respiratory distress felt secondary to an asthma exacerbation that started 3 days prior to admission. 2/28 intubated due to hypoxia, and hypercarbic, extubated 3/4 PMH significant for Asthma, Prior PE in 2015, GERD, obesity, and alcohol abuse   Clinical Impression   PTA, pt lives alone, normally completely independent in all daily tasks including full time work. Pt presents now with deficits in strength, balance, coordination, and cognition. Pt requires Min-Mod A x 2 for transfers and short bout of mobility in room, limited in progression by bout of nausea/vomiting. Pt requires Min A for UB ADL and up to Max A for LB ADLs, benefits from encouragement to initiate tasks. Anticipate based on pt's high PLOF and young age, will progress quickly with consistent OOB activity without OT follow-up at DC. Will continue to follow acutely and update DC recs as appropriate.   HR 110s with activity SpO2 WFL on RA     Recommendations for follow up therapy are one component of a multi-disciplinary discharge planning process, led by the attending physician.  Recommendations may be updated based on patient status, additional functional criteria and insurance authorization.   Follow Up Recommendations  No OT follow up    Assistance Recommended at Discharge Frequent or constant Supervision/Assistance (initially)  Patient can return home with the following A lot of help with walking and/or transfers;A lot of help with bathing/dressing/bathroom;Assistance with cooking/housework;Assist for transportation;Help with stairs or ramp for entrance    Functional Status Assessment  Patient has had a recent decline in their functional status and demonstrates the ability to make significant improvements in function  in a reasonable and predictable amount of time.  Equipment Recommendations  None recommended by OT    Recommendations for Other Services       Precautions / Restrictions Precautions Precautions: Other (comment) Precaution Comments: Flexiseal Restrictions Weight Bearing Restrictions: No      Mobility Bed Mobility               General bed mobility comments: received sitting EOB with PT    Transfers Overall transfer level: Needs assistance Equipment used: 2 person hand held assist Transfers: Sit to/from Stand, Bed to chair/wheelchair/BSC Sit to Stand: +2 physical assistance, +2 safety/equipment, Min assist Stand pivot transfers: Mod assist, +2 physical assistance         General transfer comment: able to power up with minAx1 however requires minx2 to steady due to increased posterior lean, utilizes bed posteriorly on calfs for support, modAx2 for pivoting to recliner from EoB after vomiting      Balance Overall balance assessment: Needs assistance Sitting-balance support: Feet supported, Single extremity supported, Bilateral upper extremity supported Sitting balance-Leahy Scale: Poor Sitting balance - Comments: able to balance on EoB however has posterior lean Postural control: Posterior lean Standing balance support: Bilateral upper extremity supported, During functional activity Standing balance-Leahy Scale: Poor Standing balance comment: requires 2 person HHA for stabilization                           ADL either performed or assessed with clinical judgement   ADL Overall ADL's : Needs assistance/impaired Eating/Feeding: NPO   Grooming: Set up;Sitting   Upper Body Bathing: Minimal assistance;Sitting   Lower Body Bathing: Moderate assistance;Sitting/lateral leans;Sit to/from stand   Upper Body Dressing :  Minimal assistance;Sitting Upper Body Dressing Details (indicate cue type and reason): to don clean gown after vomiting; requesting assist  due to fatigue/weakness Lower Body Dressing: Maximal assistance;Sitting/lateral leans;Sit to/from stand Lower Body Dressing Details (indicate cue type and reason): assist to maintain figure four position to doff/don socks, poor coordination noted Toilet Transfer: Moderate assistance;Stand-pivot;+2 for safety/equipment;BSC/3in1   Toileting- Clothing Manipulation and Hygiene: Moderate assistance;Sitting/lateral lean;Sit to/from stand       Functional mobility during ADLs: Moderate assistance;+2 for safety/equipment;+2 for physical assistance General ADL Comments: Pt limited by poor coordination, posterior lean with activity, and cues needed for sequencing/initiation     Vision Ability to See in Adequate Light: 0 Adequate Patient Visual Report: No change from baseline Vision Assessment?: No apparent visual deficits     Perception     Praxis      Pertinent Vitals/Pain Pain Assessment Pain Assessment: No/denies pain     Hand Dominance Right   Extremity/Trunk Assessment Upper Extremity Assessment Upper Extremity Assessment: RUE deficits/detail;LUE deficits/detail RUE Deficits / Details: coordination deficits noted, pt with with difficulty holding and manipulating items; denies decreased sensation. notably weakness, especially shoulders. RUE Coordination: decreased fine motor;decreased gross motor LUE Deficits / Details: similar to R UE LUE Coordination: decreased fine motor;decreased gross motor   Lower Extremity Assessment Lower Extremity Assessment: Defer to PT evaluation RLE Deficits / Details: ROM WFL, strength grossly 3+/5 RLE Coordination: decreased fine motor;decreased gross motor LLE Deficits / Details: ROM WFL, strength grossly 3+/5 LLE Coordination: decreased fine motor;decreased gross motor   Cervical / Trunk Assessment Cervical / Trunk Assessment: Normal   Communication Communication Communication: No difficulties   Cognition Arousal/Alertness:  Awake/alert Behavior During Therapy: Flat affect Overall Cognitive Status: Impaired/Different from baseline Area of Impairment: Orientation, Attention, Memory, Following commands, Problem solving, Safety/judgement, Awareness                 Orientation Level: Disoriented to, Time Current Attention Level: Selective Memory: Decreased short-term memory Following Commands: Follows one step commands with increased time, Follows multi-step commands inconsistently Safety/Judgement: Decreased awareness of safety Awareness: Emergent Problem Solving: Slow processing, Difficulty sequencing, Decreased initiation, Requires verbal cues, Requires tactile cues General Comments: pt with increased difficulty following questioning regarding home set up and PLOF, pt difficulty sequencing movement, requiring maximal multimodal cuing. benefits from cueing to initiate tasks without assist     General Comments  Pt reports nausea on entry, but thinks she can get up in room, however she vomits after short bout of ambulation, VSS on RA    Exercises     Shoulder Instructions      Home Living Family/patient expects to be discharged to:: Private residence Living Arrangements: Alone Available Help at Discharge: Family;Available 24 hours/day Type of Home: Apartment Home Access: Stairs to enter Entrance Stairs-Number of Steps: 4 Entrance Stairs-Rails: Right Home Layout: One level     Bathroom Shower/Tub: Tub/shower unit;Curtain   Bathroom Toilet: Standard     Home Equipment: None          Prior Functioning/Environment Prior Level of Function : Independent/Modified Independent;Working/employed;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[employer])                        OT Problem List: Decreased strength;Decreased activity tolerance;Impaired balance (sitting and/or standing);Decreased coordination;Decreased cognition;Decreased safety awareness      OT Treatment/Interventions:  Self-care/ADL training;Therapeutic exercise;Energy conservation;DME and/or AE instruction;Therapeutic activities;Patient/family education;Balance training    OT Goals(Current goals can be found in the care  plan section) Acute Rehab OT Goals Patient Stated Goal: agreeable to OOB activities OT Goal Formulation: With patient Time For Goal Achievement: 12/02/21 Potential to Achieve Goals: Good ADL Goals Pt Will Perform Grooming: with modified independence;standing Pt Will Perform Lower Body Dressing: with modified independence;sit to/from stand Pt Will Transfer to Toilet: with modified independence;ambulating  OT Frequency: Min 2X/week    Co-evaluation PT/OT/SLP Co-Evaluation/Treatment: Yes Reason for Co-Treatment: Complexity of the patient's impairments (multi-system involvement);For patient/therapist safety;To address functional/ADL transfers PT goals addressed during session: Mobility/safety with mobility OT goals addressed during session: ADL's and self-care      AM-PAC OT "6 Clicks" Daily Activity     Outcome Measure Help from another person eating meals?: Total (NPO) Help from another person taking care of personal grooming?: A Little Help from another person toileting, which includes using toliet, bedpan, or urinal?: A Lot Help from another person bathing (including washing, rinsing, drying)?: A Lot Help from another person to put on and taking off regular upper body clothing?: A Little Help from another person to put on and taking off regular lower body clothing?: A Lot 6 Click Score: 13   End of Session Equipment Utilized During Treatment: Gait belt Nurse Communication: Mobility status  Activity Tolerance: Patient limited by fatigue;Other (comment) (limited by N/V) Patient left: in chair;with call bell/phone within reach  OT Visit Diagnosis: Unsteadiness on feet (R26.81);Other abnormalities of gait and mobility (R26.89);Muscle weakness (generalized) (M62.81)                 Time: 2297-9892 OT Time Calculation (min): 19 min Charges:  OT General Charges $OT Visit: 1 Visit OT Evaluation $OT Eval Moderate Complexity: 1 Mod  Bradd Canary, OTR/L Acute Rehab Services Office: [phone]   Lorre Munroe 11/18/2021, 11:44 AM

## 2021-11-18 NOTE — Telephone Encounter (Signed)
Appointment requested in Pulmonology clinic in 2-3 weeks after discharge. ? ?Steffanie Dunn, DO 11/18/21 11:04 AM ?[facility] ? ?

## 2021-11-18 NOTE — Progress Notes (Addendum)
? ?NAME:  Kara Haynes, MRN:  [id number], DOB:  1991-05-11, LOS: 7 ?ADMISSION DATE:  11/10/2021, CONSULTATION DATE:  11/11/21 ?REFERRING MD:  Dr. Alvino Chapel , CHIEF COMPLAINT:  Asthma exacerbation ? ?History of Present Illness:  ?Kara Haynes is a 31 y.o. female with a PMH significant for Asthma, Prior PE in 2015, GERD, obesity, and alcohol abuse who presented to the ED for  respiratory distress felt secondary to an asthma exacerbation that started 3 days prior to admission. Patient attempted to use home rescue inhaler with no relief in dyspnea. Of note patient was seen at [facility] ED for similar symptoms early this am but was felt safe to discharge from ED. ?  ?On ED presentation patient was seen ill appearing with fever, tachycardia, and tachypnea.  Concern for acute asthma exacerbation possibly next in the setting of viral illness.  Lab work significant for glucose 148, albumin 3.3, lactic acid 3.0 with downtrend to 1.8.  Given persistent tachycardia tachypnea and severe bronchospasm patient was placed on BiPAP and critical care/pulmonary was consulted. ? ?Pertinent  Medical History  ?Asthma ?Prior PE in 2015 ?GERD ?Alcohol abuse ? ?Significant Hospital Events: ?Including procedures, antibiotic start and stop dates in addition to other pertinent events   ?2/27 presented to [facility], ED for complaints of chest tightness and dyspnea found to be in severe asthma exacerbation ?2/28 intubated overnight, acutely hypoxic and hypercarbic this AM, sedated and paralyzed ?3/1 improving ABG this AM, plan to supinate ?3/5 extubated ? ?Interim History / Subjective:  ?Patient was extubated yesterday. She is breathing well on room air. Not in any acute respiratory distress. Able to tolerate PO well.  ? ?States that she is only taking albuterol at home. Not on home maintenance inhaler  ? ?Objective   ?Blood pressure (!) 145/83, pulse 83, temperature 98.4 ?F (36.9 ?C), temperature source Oral, resp. rate 12, height  5\' 2"  (1.575 m), weight 75.5 kg, last menstrual period 11/07/2021, SpO2 98 %. ?   ?Vent Mode: PSV;CPAP ?FiO2 (%):  [30 %] 30 % ?PEEP:  [5 cmH20] 5 cmH20 ?Pressure Support:  [8 cmH20] 8 cmH20  ? ?Intake/Output Summary (Last 24 hours) at 11/18/2021 0730 ?Last data filed at 11/17/2021 1700 ?Gross per 24 hour  ?Intake 491.3 ml  ?Output 1700 ml  ?Net -1208.7 ml  ? ? ?Filed Weights  ? 11/10/21 1546 11/18/21 0443  ?Weight: 81.6 kg 75.5 kg  ? ? ?  ?Physical exam: ?General: Appears comfortable. Not in acute respiratory distress ?HEENT: OGT tube in place ?Neuro: Awake, alert, answer questions appropriately.  ?Chest: Expiratory wheezes bilat ?Heart: tachycardic, no murmur ?Abdomen: Soft, nontender, nondistended, bowel sounds present ?Skin: No rash ? ?Resolved Hospital Problem list   ? ? ?Assessment & Plan:  ?Status asthmaticus with with acute hypoxic and hypercarbic respiratory failure  ?Community-acquired/aspiration pneumonia ?Acute respiratory acidosis with metabolic compensation ?Patient was successfully extubated yesterday and satting well on room air. She completed 5 days therapy with Unasyn for pneumonia ?-Continue tapering IV solu-Medrol  ?-Switch to Dulera and added Singulair ?-Duonebs PRN  ?-PT/OT ? ?Alcohol use disorder ?Last alcoholic consumption 2/25. Consume 12 shots a day. ?Finished phenobarbital protocol ?Supplement thiamine, folic acid, and multivitamin ?-Counseling on alcohol cessation  ?  ?Hyperglycemia, steroid-induced ?Fasting CBG 92 this AM. Patient is slowly tolerating PO ?-Continue SSI ?-Hold long acting insuline ? ?Mild Hyponatremia ?Continue to be low in the setting of normo glycemic. 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 ?Best Practice (right click and "  Reselect all SmartList Selections" daily)  ? ?Diet/type: regular diet  ?DVT prophylaxis: LMWH ?GI prophylaxis: PPI ?Lines: no central line ?Foley:  N/A ?Code Status:  full code ? ?Goals of care discussion: 3/4 patient's father was  updated at bedside, decision was to continue full scope of care ? ?Labs   ?CBC: ?Recent Labs  ?Lab 11/12/21 ?0135 11/12/21 ?0345 11/13/21 ?7824 11/13/21 ?2353 11/15/21 ?6144 11/15/21 ?0502 11/15/21 ?1236 11/16/21 ?0259 11/16/21 ?1010 11/17/21 ?3154 11/18/21 ?0433  ?WBC 8.0  --  9.4  --  12.7*  --   --  11.8*  --  14.7* 13.4*  ?NEUTROABS 7.3  --   --   --   --   --   --   --   --   --   --   ?HGB 12.7   < > 11.6*   < > 11.2*   < > 11.9* 11.4* 14.3 13.1 12.7  ?HCT 38.0   < > 35.1*   < > 34.4*   < > 35.0* 35.1* 42.0 37.8 36.3  ?MCV 100.0  --  102.3*  --  105.2*  --   --  102.0*  --  99.2 95.8  ?PLT 233  --  253  --  280  --   --  304  --  337 338  ? < > = values in this interval not displayed.  ? ? ? ?Basic Metabolic Panel: ?Recent Labs  ?Lab 11/14/21 ?0086 11/14/21 ?1033 11/15/21 ?0316 11/15/21 ?0502 11/15/21 ?1236 11/16/21 ?0259 11/16/21 ?1010 11/17/21 ?7619 11/18/21 ?0433  ?NA 141   < > 137   < > 133* 133* 130* 132* 131*  ?K 4.8   < > 4.8   < > 4.5 4.6 5.7* 4.1 3.4*  ?CL 100  --  96*  --   --  92*  --  96* 97*  ?CO2 33*  --  36*  --   --  34*  --  25 24  ?GLUCOSE 163*  --  200*  --   --  212*  --  203* 92  ?BUN 7  --  14  --   --  12  --  21* 17  ?CREATININE 0.80  --  0.64  --   --  0.52  --  0.70 0.64  ?CALCIUM 8.6*  --  8.5*  --   --  8.2*  --  8.7* 8.6*  ?MG 2.4  --  2.5*  --   --  2.4  --  2.3 2.2  ?PHOS 3.0  --  4.1  --   --  4.4  --  4.4 4.4  ? < > = values in this interval not displayed.  ? ? ?GFR: ?Estimated Creatinine Clearance: 97 mL/min (by C-G formula based on SCr of 0.64 mg/dL). ?Recent Labs  ?Lab 11/13/21 ?0328 11/15/21 ?0316 11/16/21 ?0259 11/17/21 ?5093 11/18/21 ?0433  ?PROCALCITON <0.10  --   --   --   --   ?WBC 9.4 12.7* 11.8* 14.7* 13.4*  ? ? ? ?Liver Function Tests: ?Recent Labs  ?Lab 11/12/21 ?0135  ?AST 53*  ?ALT 27  ?ALKPHOS 51  ?BILITOT 0.6  ?PROT 6.3*  ?ALBUMIN 3.2*  ? ? ?No results for input(s): LIPASE, AMYLASE in the last 168 hours. ?No results for input(s): AMMONIA in the last 168  hours. ? ?ABG ?   ?Component Value Date/Time  ? PHART 7.386 11/16/2021 1010  ? PCO2ART 67.0 (HH) 11/16/2021 1010  ? PO2ART 83 11/16/2021  1010  ? HCO3 40.0 (H) 11/16/2021 1010  ? TCO2 42 (H) 11/16/2021 1010  ? ACIDBASEDEF 7.0 (H) 11/12/2021 1953  ? O2SAT 95 11/16/2021 1010  ? ?  ? ?Coagulation Profile: ?No results for input(s): INR, PROTIME in the last 168 hours. ? ? ?Cardiac Enzymes: ?No results for input(s): CKTOTAL, CKMB, CKMBINDEX, TROPONINI in the last 168 hours. ? ?HbA1C: ?Hgb A1c MFr Bld  ?Date/Time Value Ref Range Status  ?11/13/2021 03:28 AM 4.6 (L) 4.8 - 5.6 % Final  ?  Comment:  ?  (NOTE) ?Pre diabetes:          5.7%-6.4% ? ?Diabetes:              >6.4% ? ?Glycemic control for   <7.0% ?adults with diabetes ?  ?08/10/2014 01:00 AM 4.6 <5.7 % Final  ?  Comment:  ?  (NOTE) ?                                                                      ?According to the ADA Clinical Practice Recommendations for 2011, when ?HbA1c is used as a screening test: ? >=6.5%   Diagnostic of Diabetes Mellitus ?          (if abnormal result is confirmed) ?5.7-6.4%   Increased risk of developing Diabetes Mellitus ?References:Diagnosis and Classification of Diabetes Mellitus,Diabetes ?DQQI,2979,89(QJJHE 1):S62-S69 and Standards of Medical Care in         ?Diabetes - 2011,Diabetes Care,2011,34 (Suppl 1):S11-S61. ?  ? ? ?CBG: ?Recent Labs  ?Lab 11/17/21 ?0720 11/17/21 ?1140 11/17/21 ?1543 11/17/21 ?1916 11/17/21 ?2152  ?GLUCAP 223* 72 120* 132* 127*  ? ? ? ?Critical care time:  ?  ? ?Doran Stabler, DO ? ?

## 2021-11-18 NOTE — Evaluation (Signed)
Physical Therapy Evaluation Patient Details Name: Kara Haynes MRN: [id number] DOB: 09-05-1991 Today's Date: 11/18/2021  History of Present Illness  31 y.o. female presented to the ED 11/11/21 for respiratory distress felt secondary to an asthma exacerbation that started 3 days prior to admission. 2/28 intubated due to hypoxia, and hypercarbic, extubated 3/4 PMH significant for Asthma, Prior PE in 2015, GERD, obesity, and alcohol abuse  Clinical Impression  PTA pt living alone in single story apartment with 4 steps to enter. 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. Pt is currently limited is safe mobility by nausea and vomiting, in presence of decreased strength and coordination, with decreased command follow. Pt is min A for bed mobility, able to stand with min A however ultimately requires minAx2 for steadying in upright. After marching in place to assist with correction of increased posterior lean pt able to ambulate with min-modAx2.  Pt mobility will likely improve when not nauseous. PT recommending HHPT and 24 hour assist at discharge. PT will continue to follow acutely.     Recommendations for follow up therapy are one component of a multi-disciplinary discharge planning process, led by the attending physician.  Recommendations may be updated based on patient status, additional functional criteria and insurance authorization.  Follow Up Recommendations Home health PT    Assistance Recommended at Discharge Frequent or constant Supervision/Assistance  Patient can return home with the following  A lot of help with walking and/or transfers;A lot of help with bathing/dressing/bathroom;Assistance with cooking/housework;Assistance with feeding;Direct supervision/assist for medications management;Direct supervision/assist for financial management;Assist for transportation;Help with stairs or ramp for entrance    Equipment Recommendations Rolling walker (2 wheels)      Functional Status Assessment Patient has had a recent decline in their functional status and demonstrates the ability to make significant improvements in function in a reasonable and predictable amount of time.     Precautions / Restrictions Precautions Precaution Comments: Flexiseal Restrictions Weight Bearing Restrictions: No      Mobility  Bed Mobility Overal bed mobility: Needs Assistance Bed Mobility: Supine to Sit     Supine to sit: Min assist     General bed mobility comments: min A and maximal multimodal cuing for sequencing movement of LE across bed and coming to seated, increased posterior lean    Transfers Overall transfer level: Needs assistance Equipment used: 2 person hand held assist Transfers: Sit to/from Stand, Bed to chair/wheelchair/BSC Sit to Stand: Min assist, +2 physical assistance, +2 safety/equipment, Mod assist Stand pivot transfers: Mod assist, +2 physical assistance         General transfer comment: able to power up with minAx1 however requires minx2 to steady due to increased posterior lean, utilizes bed posteriorly on calfs for support, modAx2 for pivoting to recliner from EoB after vomiting    Ambulation/Gait Ambulation/Gait assistance: Min assist, Mod assist, +2 physical assistance Gait Distance (Feet): 5 Feet Assistive device: 2 person hand held assist Gait Pattern/deviations: Step-to pattern, Decreased step length - right, Decreased step length - left, Shuffle, Leaning posteriorly Gait velocity: slowed Gait velocity interpretation: <1.31 ft/sec, indicative of household ambulator Pre-gait activities: CoG over BoS, marching in place General Gait Details: min-modAx2 for ambulation around foot of bed, pt requiring maximal multimodal cues for sequencing, at foot of bed pt requests to sit and promptly vomits,        Balance Overall balance assessment: Needs assistance Sitting-balance support: Feet supported, Single extremity supported,  Bilateral upper extremity supported Sitting balance-Leahy Scale: Poor Sitting balance -  Comments: able to balance on EoB however has posterior lean Postural control: Posterior lean Standing balance support: Bilateral upper extremity supported, During functional activity Standing balance-Leahy Scale: Poor Standing balance comment: requires 2 person HHA for stabilization                             Pertinent Vitals/Pain Pain Assessment Pain Assessment: No/denies pain    Home Living Family/patient expects to be discharged to:: Private residence Living Arrangements: Alone Available Help at Discharge: Family;Available 24 hours/day Type of Home: Apartment Home Access: Stairs to enter Entrance Stairs-Rails: Right Entrance Stairs-Number of Steps: 4   Home Layout: One level Home Equipment: None      Prior Function Prior Level of Function : Independent/Modified Independent;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)                     Extremity/Trunk Assessment   Upper Extremity Assessment Upper Extremity Assessment: Defer to OT evaluation    Lower Extremity Assessment Lower Extremity Assessment: Generalized weakness;RLE deficits/detail;LLE deficits/detail RLE Deficits / Details: ROM WFL, strength grossly 3+/5 RLE Coordination: decreased fine motor;decreased gross motor LLE Deficits / Details: ROM WFL, strength grossly 3+/5 LLE Coordination: decreased fine motor;decreased gross motor       Communication   Communication: No difficulties  Cognition Arousal/Alertness: Awake/alert Behavior During Therapy: Flat affect Overall Cognitive Status: Impaired/Different from baseline Area of Impairment: Orientation, Attention, Memory, Following commands, Problem solving, Safety/judgement, Awareness                 Orientation Level: Disoriented to, Time Current Attention Level: Selective Memory: Decreased short-term memory Following Commands:  Follows one step commands with increased time, Follows multi-step commands inconsistently Safety/Judgement: Decreased awareness of safety Awareness: Emergent Problem Solving: Slow processing, Difficulty sequencing, Decreased initiation, Requires verbal cues, Requires tactile cues General Comments: pt with increased difficulty following questioning regarding home set up and PLOF, pt difficulty sequencing movement, requiring maximal multimodal cuing.        General Comments General comments (skin integrity, edema, etc.): Pt reports nausea on entry, but thinks she can get up in room, however she vomits after short bout of ambulation, VSS on RA    Exercises General Exercises - Lower Extremity Hip Flexion/Marching: AROM, Both, 10 reps, Standing   Assessment/Plan    PT Assessment Patient needs continued PT services  PT Problem List Decreased strength;Decreased activity tolerance;Decreased balance;Decreased mobility;Decreased coordination;Decreased cognition;Decreased safety awareness       PT Treatment Interventions DME instruction;Gait training;Stair training;Functional mobility training;Therapeutic activities;Therapeutic exercise;Balance training;Cognitive remediation;Patient/family education    PT Goals (Current goals can be found in the Care Plan section)  Acute Rehab PT Goals Patient Stated Goal: get back to work PT Goal Formulation: With patient Time For Goal Achievement: 12/02/21 Potential to Achieve Goals: Good    Frequency Min 3X/week     Co-evaluation PT/OT/SLP Co-Evaluation/Treatment: Yes Reason for Co-Treatment: For patient/therapist safety;Complexity of the patient's impairments (multi-system involvement) PT goals addressed during session: Mobility/safety with mobility         AM-PAC PT "6 Clicks" Mobility  Outcome Measure Help needed turning from your back to your side while in a flat bed without using bedrails?: None Help needed moving from lying on your back to  sitting on the side of a flat bed without using bedrails?: A Lot Help needed moving to and from a bed to a chair (including a wheelchair)?: Total Help needed standing up  from a chair using your arms (e.g., wheelchair or bedside chair)?: Total Help needed to walk in hospital room?: Total Help needed climbing 3-5 steps with a railing? : Total 6 Click Score: 10    End of Session Equipment Utilized During Treatment: Gait belt Activity Tolerance: Other (comment) (limited by nausea and vomiting) Patient left: in chair;with call bell/phone within reach;with family/visitor present Nurse Communication: Mobility status;Other (comment) (vomiting) PT Visit Diagnosis: Unsteadiness on feet (R26.81);Muscle weakness (generalized) (M62.81);Other abnormalities of gait and mobility (R26.89);Difficulty in walking, not elsewhere classified (R26.2)    Time: 0923-1000 PT Time Calculation (min) (ACUTE ONLY): 37 min   Charges:   PT Evaluation $PT Eval Moderate Complexity: 1 Mod          Yavier Snider B. Migdalia Dk PT, DPT Acute Rehabilitation Services Pager [phone] Office [phone]   [place] 11/18/2021, 11:19 AM

## 2021-11-18 NOTE — Progress Notes (Signed)
Patient transferred to 6N20 via wheelchair with monitor ?

## 2021-11-18 NOTE — Progress Notes (Addendum)
Nutrition Follow-up ? ?DOCUMENTATION CODES:  ? ?Not applicable ? ?INTERVENTION:  ? ?-Ensure Enlive po BID, each supplement provides 350 kcal and 20 grams of protein.  ?-MVI with minerals daily ?-Continue 1000 mcg vitamin B-12 daily ?-Continue 220 mg zinc sulfate daily x 14 days ?-Continue 500 mg vitamin C BID ? ?NUTRITION DIAGNOSIS:  ? ?Inadequate oral intake related to inability to eat as evidenced by NPO status. ? ?Progressing; advanced to PO diet on 11/17/21 ? ?GOAL:  ? ?Patient will meet greater than or equal to 90% of their needs ? ?Progressing  ? ?MONITOR:  ? ?PO intake, Supplement acceptance, Labs, Weight trends, Skin, I & O's ? ?REASON FOR ASSESSMENT:  ? ?Ventilator, Consult ?Enteral/tube feeding initiation and management ? ?ASSESSMENT:  ? ?31 year old female who presented to the ED on 2/26 with respiratory distress and chest pain. PMH of asthma, GERD, EtOH abuse, hepatic steatosis, pulmonary embolism, pelvic adhesive disease, retroperitoneal fibrosis, endometriosis. Pt admitted with asthma exacerbation. ? ?02/28 - intubated, proned ?03/01- vitamin B-12 low (122); supplemented with 1000 mcg daily per tube; cortrak tube placed (gastric) ?03/02- zinc low (43)- supplemented with 220 mg BID; vitamin C low (0.3)- supplemented 500 mg BID; rectal tube placed due to multiple loose stools ?03/05- extubated, advanced to regular diet ? ?Reviewed I/O's: -2.5 L x 24 hours and +3.4 L since admission ? ?UOP: 2.6 L x 24 hours ? ?Rectal tube output: 400 ml x 24 hours ? ?Pt unavailable at time of visit. Attempted to speak with pt via call to hospital room phone, however, unable to reach.  ? ?Vitamin B6 level not resulted yet. Noted supplemental vitamins have been changed to PO route.  ? ?Pt on a regular diet. Per chart review, pt is taking PO's well. Noted meal completions 30-100%. Given variable oral intake and increased nutritional needs secondary to acute illness, pt would greatly benefit from addition of oral nutrition  supplements.  ? ?Reviewed wt hx; noted pt has experienced a 7.5% wt loss since admission, which is significant for time frame, however, question accuracy of admission weight.  ?  ?Medications reviewed and include folic acid, prednisone, vitamin B-12, and zinc sulfate. ? ?Labs reviewed: Na: 131, K: 3.4, CBGS: 72-223 (inpatient orders for glycemic control are 0-20 units insulin aspart TID with meals and 0-5 units insulin aspart daily at bedtime).   ? ?Diet Order:   ?Diet Order   ? ?       ?  Diet regular Room service appropriate? Yes; Fluid consistency: Thin  Diet effective now       ?  ? ?  ?  ? ?  ? ? ?EDUCATION NEEDS:  ? ?Not appropriate for education at this time ? ?Skin:  Skin Assessment: Reviewed RN Assessment ? ?Last BM:  11/18/21 (type 7 via rectal tube) ? ?Height:  ? ?Ht Readings from Last 1 Encounters:  ?11/10/21 5\' 2"  (1.575 m)  ? ? ?Weight:  ? ?Wt Readings from Last 1 Encounters:  ?11/18/21 75.5 kg  ? ? ?BMI:  Body mass index is 30.44 kg/m?. ? ?Estimated Nutritional Needs:  ? ?Kcal:  2050-2250 ? ?Protein:  100-115 grams ? ?Fluid:  > 2 L ? ? ? ?Loistine Chance, RD, LDN, CDCES ?Registered Dietitian II ?Certified Diabetes Care and Education Specialist ?Please refer to [facility] for RD and/or RD on-call/weekend/after hours pager  ?

## 2021-11-18 NOTE — Progress Notes (Signed)
Sign out to [facility]. They will pick up patient tomorrow.  ?

## 2021-11-18 NOTE — TOC Initial Note (Signed)
Transition of Care (TOC) - Initial/Assessment Note  ? ? ?Patient Details  ?Name: Kara Haynes ?MRN: [id number] ?Date of Birth: June 09, 1991 ? ?Transition of Care (TOC) CM/SW Contact:    ?Tom-Johnson, Hershal Coria, RN ?Phone Number: ?11/18/2021, 3:46 PM ? ?Clinical Narrative:                 ? ?CM spoke with patient and father, [name] at bedside about needs for post hospital transition. Admitted for Asthma Exacerbation. From home with girlfriend. Currently employed at [employer]. Independent with care and drives self prior to hospitalization.  ?Does not have a PCP and no Medical Insurance. CM sent message to [employer] to verify Medicaid eligibility.  ?MATCH done for medication assistance. MD notified to send prescriptions to [facility] pharmacy at discharge.  ?RW order sent to [facility] for charity. [name] to deliver at bedside. [facility] is [employer] this week. Referral sent to [facility] and awaiting response. Hospital f/u scheduled with [facility] and information on AVS.   ?CM will continue to follow with needs.  ? ?Expected Discharge Plan: Home w Home Health Services ?Barriers to Discharge: Continued Medical Work up ? ? ?Patient Goals and CMS Choice ?Patient states their goals for this hospitalization and ongoing recovery are:: To return home ?CMS Medicare.gov Compare Post Acute Care list provided to:: Patient ?Choice offered to / list presented to : Patient ? ?Expected Discharge Plan and Services ?Expected Discharge Plan: Home w Home Health Services ?In-house Referral: Artist, PCP / Health Connect ?Discharge Planning Services: CM Consult, MATCH Program, Follow-up appt scheduled ?  ?Living arrangements for the past 2 months: Single Family Home ?                ?DME Arranged: Walker rolling ?DME Agency: [facility]) ?Date DME Agency Contacted: 11/18/21 ?Time DME Agency Contacted: 1544 ?Representative spoke with at DME Agency: [name] ?HH Arranged: PT ?HH Agency:  Presenter, broadcasting [facility]) ?Date HH Agency Contacted: 11/18/21 ?Time HH Agency Contacted: 1540 ?Representative spoke with at [facility] Agency: [name] ? ?Prior Living Arrangements/Services ?Living arrangements for the past 2 months: Single Family Home ?Lives with:: Significant Other ?Patient language and need for interpreter reviewed:: Yes ?Do you feel safe going back to the place where you live?: Yes      ?Need for Family Participation in Patient Care: Yes (Comment) ?Care giver support system in place?: Yes (comment) ?  ?Criminal Activity/Legal Involvement Pertinent to Current Situation/Hospitalization: No - Comment as needed ? ?Activities of Daily Living ?  ?  ? ?Permission Sought/Granted ?Permission sought to share information with : Case Manager, Magazine features editor, Family Supports ?Permission granted to share information with : Yes, Verbal Permission Granted ? Share Information with NAME: [name] ? Permission granted to share info w AGENCY: [facility] ?   ?   ? ?Emotional Assessment ?Appearance:: Appears stated age ?Attitude/Demeanor/Rapport: Engaged, Gracious ?Affect (typically observed): Accepting, Appropriate, Calm, Hopeful ?Orientation: : Oriented to Self, Oriented to Place, Oriented to  Time, Oriented to Situation ?Alcohol / Substance Use: Not Applicable ?Psych Involvement: No (comment) ? ?Admission diagnosis:  Asthma exacerbation [J45.901] ?Asthma [J45.909] ?Alcohol withdrawal syndrome without complication (HCC) [F10.930] ?Severe asthma with exacerbation, unspecified whether persistent [J45.901] ?Community acquired pneumonia, unspecified laterality [J18.9] ?Patient Active Problem List  ? Diagnosis Date Noted  ? Acute hypoxemic respiratory failure (HCC) due to status asthmaticus 11/15/2021  ? Patient ventilator dyssynchrony (HCC) 11/15/2021  ? Lactic acid acidosis 11/15/2021  ? Alcohol withdrawal syndrome without complication (HCC)   ?  Community acquired pneumonia   ? Acute respiratory failure  with hypercapnia (HCC)   ? Respiratory acidosis   ? Asthma 11/11/2021  ? Asthma exacerbation 11/10/2021  ? Transaminitis 11/10/2021  ? Endometriosis of left ovary 07/25/2020  ? Retroperitoneal fibrosis   ? Dysmenorrhea 06/13/2020  ? 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  ? Left ovarian cyst 04/27/2020  ? Obesity (BMI 30-39.9) 04/27/2020  ? Alcohol abuse 03/17/2020  ? Hepatic steatosis 06/26/2016  ? Tobacco abuse 06/26/2016  ? Sepsis (HCC)   ? History of pulmonary embolism 10/26/2015  ? Marijuana use 10/26/2015  ? PNA (pneumonia) 08/09/2014  ? ?PCP:  Evlyn Kanner, MD ?Pharmacy:   ?[id number] [place], [place] - [address] ?[address] ?[place] [address] ?Phone: [phone] Fax: [phone] ? ?[facility] ?[address] [place] ?[place] [address] ?Phone: [phone] Fax: [phone] ? ?[id number] [place], [place] - [address] ?[address] ?[place] [address] ?Phone: [phone] Fax: [phone] ? ? ? ? ?Social Determinants of Health (SDOH) Interventions ?  ? ?Readmission Risk Interventions ?Readmission Risk Prevention Plan 11/18/2021  ?Transportation Screening Complete  ?PCP or Specialist Appt within 3-5 Days Complete  ?HRI or Home Care Consult Complete  ?Social Work Consult for Recovery Care Planning/Counseling Complete  ?Palliative Care Screening Not Applicable  ?Medication Review Oceanographer) Complete  ?Some recent data might be hidden  ? ? ? ?

## 2021-11-19 ENCOUNTER — Other Ambulatory Visit (HOSPITAL_COMMUNITY): Payer: Self-pay

## 2021-11-19 DIAGNOSIS — R531 Weakness: Secondary | ICD-10-CM

## 2021-11-19 LAB — BASIC METABOLIC PANEL
Anion gap: 8 (ref 5–15)
BUN: 11 mg/dL (ref 6–20)
CO2: 24 mmol/L (ref 22–32)
Calcium: 8.5 mg/dL — ABNORMAL LOW (ref 8.9–10.3)
Chloride: 99 mmol/L (ref 98–111)
Creatinine, Ser: 0.76 mg/dL (ref 0.44–1.00)
GFR, Estimated: >60 mL/min (ref >60)
Glucose, Bld: 98 mg/dL (ref 70–99)
Potassium: 3.5 mmol/L (ref 3.5–5.1)
Sodium: 131 mmol/L — ABNORMAL LOW (ref 135–145)

## 2021-11-19 LAB — CBC
HCT: 37.5 % (ref 36.0–46.0)
Hemoglobin: 12.7 g/dL (ref 12.0–15.0)
MCH: 32.9 pg (ref 26.0–34.0)
MCHC: 33.9 g/dL (ref 30.0–36.0)
MCV: 97.2 fL (ref 80.0–100.0)
Platelets: 320 10*3/uL (ref 150–400)
RBC: 3.86 MIL/uL — ABNORMAL LOW (ref 3.87–5.11)
RDW: 13.9 % (ref 11.5–15.5)
WBC: 10 10*3/uL (ref 4.0–10.5)
nRBC: 0 % (ref 0.0–0.2)

## 2021-11-19 LAB — GLUCOSE, CAPILLARY
Glucose-Capillary: 105 mg/dL — ABNORMAL HIGH (ref 70–99)
Glucose-Capillary: 113 mg/dL — ABNORMAL HIGH (ref 70–99)

## 2021-11-19 MED ORDER — MOMETASONE FURO-FORMOTEROL FUM 200-5 MCG/ACT IN AERO
2.0000 | INHALATION_SPRAY | Freq: Two times a day (BID) | RESPIRATORY_TRACT | 2 refills | Status: DC
  Filled 2021-11-19: qty 13, 30d supply, fill #0

## 2021-11-19 MED ORDER — LORATADINE 10 MG PO TABS
10.0000 mg | ORAL_TABLET | Freq: Every day | ORAL | 2 refills | Status: DC
  Filled 2021-11-19: qty 30, 30d supply, fill #0

## 2021-11-19 MED ORDER — PREDNISONE 20 MG PO TABS
ORAL_TABLET | ORAL | 0 refills | Status: AC
  Filled 2021-11-19: qty 18, 15d supply, fill #0

## 2021-11-19 MED ORDER — MONTELUKAST SODIUM 10 MG PO TABS
10.0000 mg | ORAL_TABLET | Freq: Every day | ORAL | 2 refills | Status: DC
  Filled 2021-11-19 – 2021-12-18 (×2): qty 30, 30d supply, fill #0

## 2021-11-19 MED ORDER — ALBUTEROL SULFATE HFA 108 (90 BASE) MCG/ACT IN AERS
2.0000 | INHALATION_SPRAY | Freq: Four times a day (QID) | RESPIRATORY_TRACT | 2 refills | Status: DC | PRN
  Filled 2021-11-19 – 2022-01-07 (×3): qty 18, 25d supply, fill #0

## 2021-11-19 MED ORDER — AEROCHAMBER PLUS FLO-VU LARGE MISC
1.0000 | Freq: Once | Status: AC
  Administered 2021-11-19: 1

## 2021-11-19 MED ORDER — CYANOCOBALAMIN 1000 MCG PO TABS
1000.0000 ug | ORAL_TABLET | Freq: Every day | ORAL | 2 refills | Status: DC
  Filled 2021-11-19: qty 30, 30d supply, fill #0

## 2021-11-19 MED ORDER — AMLODIPINE BESYLATE 5 MG PO TABS
5.0000 mg | ORAL_TABLET | Freq: Every day | ORAL | 2 refills | Status: DC
  Filled 2021-11-19 – 2021-12-18 (×3): qty 30, 30d supply, fill #0

## 2021-11-19 NOTE — TOC Progression Note (Addendum)
Transition of Care (TOC) - Progression Note  ? ? ?Patient Details  ?Name: Kara Haynes ?MRN: [id number] ?Date of Birth: 1991/07/01 ? ?Transition of Care (TOC) CM/SW Contact  ?Marilu Favre, RN ?Phone Number: ?11/19/2021, 10:35 AM ? ?Clinical Narrative:    ? ?Patient for discharge today. Dr Marianna Payment will follow in clinic. Appointment cancelled for [facility] .  ? ?Stacie with [facility] checking to see if they can accept for HHPT.  ? ?Scripts were sent to [facility]. Previous NCM entered in [facility] and ordered walker for home.   ? ?[facility] accepted patient for HHPT. Information on AVS ? ?Expected Discharge Plan: [place] ?Barriers to Discharge: Continued Medical Work up ? ?Expected Discharge Plan and Services ?Expected Discharge Plan: [place] ?In-house Referral: Development worker, community, PCP / Health Connect ?Discharge Planning Services: CM Consult, [place] Program, Follow-up appt scheduled ?  ?Living arrangements for the past 2 months: [place] ?                ?DME Arranged: Walker rolling ?DME Agency: [facility]) ?Date DME Agency Contacted: 11/18/21 ?Time DME Agency Contacted: [phone] ?Representative spoke with at DME Agency: Freda Munro ?HH Arranged: PT ?[place] Agency: Public affairs consultant [facility]) ?Date HH Agency Contacted: 11/18/21 ?Time [place]: 1540 ?Representative spoke with at [place]: Erline Levine ? ? ?Social Determinants of Health (SDOH) Interventions ?  ? ?Readmission Risk Interventions ?Readmission Risk Prevention Plan 11/18/2021  ?Transportation Screening Complete  ?PCP or Specialist Appt within 3-5 Days Complete  ?[place] or Home Care Consult Complete  ?Social Work Consult for [place] Planning/Counseling Complete  ?Palliative Care Screening Not Applicable  ?Medication Review Press photographer) Complete  ?Some recent data might be hidden  ? ? ?

## 2021-11-19 NOTE — Progress Notes (Signed)
? ?HD#8 ?SUBJECTIVE:  ?Patient Summary: Kara Haynes is a functionally independent 31 y.o. female with a pertinent PMH of ZETA BUCY is a 31 y.o. female with a PMH significant for Asthma, Prior PE in 2015, GERD, obesity, and alcohol abuse who presented to the ED for respiratory distress and admitted for Asthma Exacerbation complicated by status asthmaticus and aspiration pneumonia requiring intubation.  ? ?Overnight Events: no overnight events ? ?Interim History: Patient resting in bed. She is breathing well and denies any new complaints.  ? ?OBJECTIVE:  ?Vital Signs: ?Vitals:  ? 11/18/21 1600 11/18/21 1652 11/18/21 1959 11/19/21 0447  ?BP: 125/76 (!) 125/94 (!) 118/99 119/72  ?Pulse: 92 91 95 66  ?Resp: 15 16 17 18   ?Temp:  98.3 ?F (36.8 ?C) 97.8 ?F (36.6 ?C) 98.1 ?F (36.7 ?C)  ?TempSrc:  Oral Oral Oral  ?SpO2: 97% 96% 100% 98%  ?Weight:      ?Height:      ? ?Supplemental O2: Room Air ? ? ?Filed Weights  ? 11/10/21 1546 11/18/21 0443  ?Weight: 81.6 kg 75.5 kg  ? ? ? ?Intake/Output Summary (Last 24 hours) at 11/19/2021 0731 ?Last data filed at 11/18/2021 1600 ?Gross per 24 hour  ?Intake 360 ml  ?Output 2550 ml  ?Net -2190 ml  ? ?Net IO Since Admission: 1,227.02 mL [11/19/21 0731] ? ?Physical Exam: ?Physical Exam ?Constitutional:   ?   Appearance: Normal appearance.  ?HENT:  ?   Head: Normocephalic and atraumatic.  ?Eyes:  ?   Extraocular Movements: Extraocular movements intact.  ?Cardiovascular:  ?   Rate and Rhythm: Normal rate.  ?   Pulses: Normal pulses.  ?   Heart sounds: Normal heart sounds.  ?Pulmonary:  ?   Effort: Pulmonary effort is normal.  ?   Breath sounds: Normal breath sounds.  ?Abdominal:  ?   General: Bowel sounds are normal.  ?   Palpations: Abdomen is soft.  ?   Tenderness: There is no abdominal tenderness.  ?Musculoskeletal:     ?   General: Normal range of motion.  ?   Cervical back: Normal range of motion.  ?   Right lower leg: No edema.  ?   Left lower leg: No edema.   ?Skin: ?   General: Skin is warm and dry.  ?Neurological:  ?   Mental Status: She is alert and oriented to person, place, and time. Mental status is at baseline.  ?   Motor: Weakness (generalized) present.  ?Psychiatric:     ?   Mood and Affect: Mood normal.  ? ? ?Patient Lines/Drains/Airways Status   ? ? Active Line/Drains/Airways   ? ? Name Placement date Placement time Site Days  ? Peripheral IV 11/10/21 20 G Right Antecubital 11/10/21  1527  Antecubital  9  ? Peripheral IV 11/12/21 22 G Anterior;Left Forearm 11/12/21  0034  Forearm  7  ? Peripheral IV 11/12/21 22 G 1.75" Left;Lateral;Anterior Forearm 11/12/21  1223  Forearm  7  ? Incision (Closed) 06/05/20 Abdomen Other (Comment) 06/05/20  06/07/20  -- 532  ? Incision - 4 Ports Abdomen Umbilicus Right;Mid Left;Mid Lower;Mid 06/05/20  0800  -- 532  ? Incision - 5 Ports Abdomen 1: Right;Lateral 2: Umbilicus;Upper 3: Left;Lateral;Upper 4: Left;Lateral;Mid 5: Left;Lower;Lateral 07/03/20  1110  -- 504  ? ?  ?  ? ?  ? ? ?Pertinent Labs: ?CBC Latest Ref Rng & Units 11/19/2021 11/18/2021 11/17/2021  ?WBC 4.0 - 10.5 K/uL 10.0 13.4(H) 14.7(H)  ?Hemoglobin  12.0 - 15.0 g/dL 10.1 75.1 02.5  ?Hematocrit 36.0 - 46.0 % 37.5 36.3 37.8  ?Platelets 150 - 400 K/uL 320 338 337  ? ? ?CMP Latest Ref Rng & Units 11/19/2021 11/18/2021 11/17/2021  ?Glucose 70 - 99 mg/dL 98 92 852(D)  ?BUN 6 - 20 mg/dL 11 17 78(E)  ?Creatinine 0.44 - 1.00 mg/dL 4.23 5.36 1.44  ?Sodium 135 - 145 mmol/L 131(L) 131(L) 132(L)  ?Potassium 3.5 - 5.1 mmol/L 3.5 3.4(L) 4.1  ?Chloride 98 - 111 mmol/L 99 97(L) 96(L)  ?CO2 22 - 32 mmol/L 24 24 25   ?Calcium 8.9 - 10.3 mg/dL ) 3.1(V) 4.0(G)  ?Total Protein 6.5 - 8.1 g/dL - - -  ?Total Bilirubin 0.3 - 1.2 mg/dL - - -  ?Alkaline Phos 38 - 126 U/L - - -  ?AST 15 - 41 U/L - - -  ?ALT 0 - 44 U/L - - -  ? ? ?Recent Labs  ?  11/18/21 ?1148 11/18/21 ?1629 11/18/21 ?2019  ?GLUCAP 130* 80 119*  ?  ? ?Pertinent Imaging: ?No results found. ? ?ASSESSMENT/PLAN:  ?Assessment: ?Principal  Problem: ?  Acute hypoxemic respiratory failure (HCC) due to status asthmaticus ?Active Problems: ?  Sepsis (HCC) ?  Hepatic steatosis ?  Alcohol abuse ?  Asthma exacerbation ?  Transaminitis ?  Alcohol withdrawal syndrome without complication (HCC) ?  Community acquired pneumonia ?  Acute respiratory failure with hypercapnia (HCC) ?  Respiratory acidosis ?  Patient ventilator dyssynchrony (HCC) ?  Lactic acid acidosis ? ? ?Plan: ?Asthma Exacerbation: ?Patient presented with respiratory failure in the setting of status asthmaticus complicated by aspiration pneumonia requiring intubation. She has completed 5 days of AB therapy and successfully been extubated. Patient has required prolonged systemic steroids and currently tapering dose.  ?-Continue prednisone with taper  ?-Cont Dulera and Singulair ?-Duonebs PRN  ?  ?Alcohol use disorder ?- TOC for alcohol cessation counseling ?  ?Hyperglycemia, steroid-induced ?CBG this AM was 105. Patient tolerating PO intake and not requiring insulin  ?-Continue SSI prn ? ? ?Best Practice: ?Diet: Diabetic diet ?IVF: none ?VTE: enoxaparin (LOVENOX) injection 40 mg Start: 11/14/21 1000 ?Code: Full ?AB: none ?Therapy Recs: Home Health, DME: walker ?Family Contact: Father, at bedside. ?DISPO: Anticipated discharge today to Home pending  HH/PT set up . ? ?Signature: ?01/14/22, D.O.  ?Internal Medicine Resident, PGY-3 ?[facility]  ?Pager: [phone] ?7:31 AM, 11/19/2021  ? ?Please contact the on call pager after 5 pm and on weekends at [phone].  ?

## 2021-11-19 NOTE — Progress Notes (Signed)
Patient was given discharge instructions and stated understanding. 

## 2021-11-19 NOTE — Telephone Encounter (Signed)
Patient is scheduled on 12/11/2021 at 11:30am with Dr. Everardo All- nothing further needed. ?

## 2021-11-19 NOTE — Plan of Care (Signed)
  Problem: Nutrition: Goal: Adequate nutrition will be maintained Outcome: Progressing   Problem: Pain Managment: Goal: General experience of comfort will improve Outcome: Progressing   Problem: Safety: Goal: Ability to remain free from injury will improve Outcome: Progressing   

## 2021-11-19 NOTE — TOC Progression Note (Signed)
Transition of Care (TOC) - Progression Note  ? ? ?Patient Details  ?Name: Kara Haynes ?MRN: [id number] ?Date of Birth: Apr 21, 1991 ? ?Transition of Care (TOC) CM/SW Contact  ?Tom-Johnson, Hershal Coria, RN ?Phone Number: ?11/19/2021, 10:17 AM ? ?Clinical Narrative:    ?Physiological scientist has assigned patient's account to [employer] so that she can be screened for Medicaid today. [facility] did not respond yesterday for charity home health PT. Patient now on 6N. CM will followup with needs. ? ? ?Expected Discharge Plan: Home w Home Health Services ?Barriers to Discharge: Continued Medical Work up ? ?Expected Discharge Plan and Services ?Expected Discharge Plan: Home w Home Health Services ?In-house Referral: Artist, PCP / [facility] ?Discharge Planning Services: CM Consult, MATCH Program, Follow-up appt scheduled ?  ?Living arrangements for the past 2 months: Single Family Home ?                ?DME Arranged: Walker rolling ?DME Agency: [facility]) ?Date DME Agency Contacted: 11/18/21 ?Time DME Agency Contacted: 1544 ?Representative spoke with at DME Agency: [name] ?HH Arranged: PT ?HH Agency: [facility] [facility]) ?Date HH Agency Contacted: 11/18/21 ?Time HH Agency Contacted: 1540 ?Representative spoke with at [facility] Agency: [name] ? ? ?Social Determinants of Health (SDOH) Interventions ?  ? ?Readmission Risk Interventions ?Readmission Risk Prevention Plan 11/18/2021  ?Transportation Screening Complete  ?PCP or Specialist Appt within 3-5 Days Complete  ?HRI or Home Care Consult Complete  ?Social Work Consult for Recovery Care Planning/Counseling Complete  ?Palliative Care Screening Not Applicable  ?Medication Review Oceanographer) Complete  ?Some recent data might be hidden  ? ? ?

## 2021-11-19 NOTE — Plan of Care (Signed)
?  Problem: Inadequate Intake (NI-2.1) ?Goal: Food and/or nutrient delivery ?Description: Individualized approach for food/nutrient provision. ?Outcome: Adequate for Discharge ?  ?Problem: Education: ?Goal: Knowledge of General Education information will improve ?Description: Including pain rating scale, medication(s)/side effects and non-pharmacologic comfort measures ?Outcome: Adequate for Discharge ?  ?Problem: Health Behavior/Discharge Planning: ?Goal: Ability to manage health-related needs will improve ?Outcome: Adequate for Discharge ?  ?Problem: Clinical Measurements: ?Goal: Ability to maintain clinical measurements within normal limits will improve ?Outcome: Adequate for Discharge ?Goal: Will remain free from infection ?Outcome: Adequate for Discharge ?Goal: Diagnostic test results will improve ?Outcome: Adequate for Discharge ?Goal: Respiratory complications will improve ?Outcome: Adequate for Discharge ?Goal: Cardiovascular complication will be avoided ?Outcome: Adequate for Discharge ?  ?Problem: Activity: ?Goal: Risk for activity intolerance will decrease ?Outcome: Adequate for Discharge ?  ?Problem: Nutrition: ?Goal: Adequate nutrition will be maintained ?Outcome: Adequate for Discharge ?  ?Problem: Coping: ?Goal: Level of anxiety will decrease ?Outcome: Adequate for Discharge ?  ?Problem: Elimination: ?Goal: Will not experience complications related to bowel motility ?Outcome: Adequate for Discharge ?Goal: Will not experience complications related to urinary retention ?Outcome: Adequate for Discharge ?  ?Problem: Pain Managment: ?Goal: General experience of comfort will improve ?Outcome: Adequate for Discharge ?  ?Problem: Safety: ?Goal: Ability to remain free from injury will improve ?Outcome: Adequate for Discharge ?  ?Problem: Skin Integrity: ?Goal: Risk for impaired skin integrity will decrease ?Outcome: Adequate for Discharge ?  ?Problem: Safety: ?Goal: Non-violent Restraint(s) ?Outcome: Adequate  for Discharge ?  ?Problem: Acute Rehab PT Goals(only PT should resolve) ?Goal: Pt Will Go Supine/Side To Sit ?Outcome: Adequate for Discharge ?Goal: Patient Will Transfer Sit To/From Stand ?Outcome: Adequate for Discharge ?Goal: Pt Will Ambulate ?Outcome: Adequate for Discharge ?Goal: Pt Will Go Up/Down Stairs ?Outcome: Adequate for Discharge ?  ?Problem: Acute Rehab OT Goals (only OT should resolve) ?Goal: Pt. Will Perform Grooming ?Outcome: Adequate for Discharge ?Goal: Pt. Will Perform Lower Body Dressing ?Outcome: Adequate for Discharge ?Goal: Pt. Will Transfer To Toilet ?Outcome: Adequate for Discharge ?  ?

## 2021-11-19 NOTE — Discharge Summary (Signed)
? ?Name: Kara Haynes ?MRN: [id number] ?DOB: 03-Jun-1991 31 y.o. ?PCP: Evlyn Kanner, MD ? ?Date of Admission: 11/10/2021  3:08 PM ?Date of Discharge: 11/19/2021 ?Attending Physician: Miguel Aschoff, MD ? ?Discharge Diagnosis: ?Principal Problem: ?  Acute hypoxemic respiratory failure (HCC) due to status asthmaticus ?Active Problems: ?  Sepsis (HCC) ?  Hepatic steatosis ?  Alcohol abuse ?  Asthma exacerbation ?  Transaminitis ?  Alcohol withdrawal syndrome without complication (HCC) ?  Community acquired pneumonia ?  Acute respiratory failure with hypercapnia (HCC) ?  Respiratory acidosis ?  Patient ventilator dyssynchrony (HCC) ?  Lactic acid acidosis ? ?Discharge Medications: ?Allergies as of 11/19/2021   ? ?   Reactions  ? Fish Allergy Hives, Shortness Of Breath, Swelling  ? Mostly tilapia  ? ?  ? ?  ?Medication List  ?  ? ?STOP taking these medications   ? ?cetirizine 10 MG tablet ?Commonly known as: ZyrTEC Allergy ?  ?fluticasone 44 MCG/ACT inhaler ?Commonly known as: Flovent HFA ?  ?ipratropium 17 MCG/ACT inhaler ?Commonly known as: ATROVENT HFA ?  ?pantoprazole 40 MG tablet ?Commonly known as: PROTONIX ?  ? ?  ? ?TAKE these medications   ? ?acetaminophen 500 MG tablet ?Commonly known as: TYLENOL ?Take 500 mg by mouth every 6 (six) hours as needed for mild pain or moderate pain. ?  ?albuterol 108 (90 Base) MCG/ACT inhaler ?Commonly known as: VENTOLIN HFA ?Inhale 2 puffs into the lungs every 6 (six) hours as needed for wheezing or shortness of breath. ?  ?amLODipine 5 MG tablet ?Commonly known as: NORVASC ?Take 1 tablet (5 mg total) by mouth daily. ?  ?Dulera 200-5 MCG/ACT Aero ?Generic drug: mometasone-formoterol ?Inhale 2 puffs into the lungs 2 (two) times daily. ?  ?loratadine 10 MG tablet ?Commonly known as: CLARITIN ?Take 1 tablet (10 mg total) by mouth daily. ?  ?montelukast 10 MG tablet ?Commonly known as: SINGULAIR ?Take 1 tablet (10 mg total) by mouth at bedtime. ?  ?predniSONE 20 MG  tablet ?Commonly known as: DELTASONE ?Take 2 tablets (40 mg total) by mouth daily with breakfast for 5 days, THEN 1 tablet (20 mg total) daily with breakfast for 5 days, THEN 0.5 tablets (10 mg total) daily with breakfast for 5 days. ?Start taking on: November 20, 2021 ?What changed: See the new instructions. ?  ?vitamin B-12 1000 MCG tablet ?Commonly known as: CYANOCOBALAMIN ?Take 1 tablet (1,000 mcg total) by mouth daily. ?  ? ?  ? ? ?Disposition and follow-up:   ?[name] was discharged from [facility] in Good condition.  At the hospital follow up visit please address: ? ?1.  Problems to follow up: ? ?Asthma - make sure patient finishes prednisone taper and has her inhalers. ? ?HTN - taking amlodipine for HTN make sure she is taking it and titrate meds as needed ? ?B12 - may need a repeat B12 at some point ? ?2.  Labs / imaging needed at time of follow-up: BMP, B12  ? ?3.  Pending labs/ test needing follow-up: none ? ?Follow-up Appointments: ? Follow-up Information   ? ? [facility]. Go on 11/26/2021.   ?Why: Appointment Time: 10:15 am  ?Please arrive at 10:00 am ?Contact information: ?[address] ?[place] [address] ?[phone] ? ?  ?  ? ? [facility].   ?Specialty: Home Health Services ?Contact information: ?[address] ?[address] ?[place] [address] ?[phone] ? ? ?  ?  ? ?  ?  ? ?  ? ? ?  Hospital Course by problem list: ?1. Status Asthmaticus:  ?Patient presented to [facility] with shortness of breath that progressed to respiratory distress in the setting of asthma exacerbation.  Patient's respiratory distress continue to escalate.  Patient tired out and ultimately aspirated requiring intubation and transferred to the ICU.  Patient completed 5 days of antibiotic therapy for aspiration pneumonia.  She was able to be extubated successfully.  She was kept on a long taper of steroids that she will complete in  the outpatient setting.  Patient was back on room air prior to discharge without wheezing.  She will be discharged with ICF/LABA in addition to montelukast.  She had a follow-up appointment scheduled at the [facility] outpatient. ? ?2. HTN: ?Patient was started on amlodipine for HTN during her inpatient stay.  ? ?Discharge Exam:   ?BP 112/83 (BP Location: Right Arm)   Pulse 88   Temp 97.9 ?F (36.6 ?C)   Resp 18   Ht 5\' 2"  (1.575 m)   Wt 75.5 kg   LMP 11/07/2021   SpO2 100%   BMI 30.44 kg/m?  ? ? ?Pertinent Labs, Studies, and Procedures:  ?CTA: ?No evidence of acute pulmonary embolism to the proximal segmental ?level. More distal evaluation is limited by contrast bolus timing. ?  ?Few patchy ground-glass opacity bilaterally primarily within the ?anterior right upper lobe. Likely represents infectious/inflammatory ?process. A mildly enlarged right suprahilar node is probably ?reactive. ? ?Discharge Instructions: ?Discharge Instructions   ? ? Diet - low sodium heart healthy   Complete by: As directed ?  ? Face-to-face encounter (required for Medicare/Medicaid patients)   Complete by: As directed ?  ? I 11/09/2021 certify that this patient is under my care and that I, or a nurse practitioner or physician's assistant working with me, had a face-to-face encounter that meets the physician face-to-face encounter requirements with this patient on 11/19/2021. The encounter with the patient was in whole, or in part for the following medical condition(s) which is the primary reason for home health care (List medical condition): generalized weakness, status asthmasticus, aspiraiton pneumonia  ? The encounter with the patient was in whole, or in part, for the following medical condition, which is the primary reason for home health care: generalized weakness, status asthmasticus, aspiraiton pneumonia  ? I certify that, based on my findings, the following services are medically necessary home health services: Physical  therapy  ? Reason for Medically Necessary Home Health Services: Therapy- Therapeutic Exercises to Increase Strength and Endurance  ? My clinical findings support the need for the above services: Shortness of breath with activity  ? Further, I certify that my clinical findings support that this patient is homebound due to: Shortness of Breath with activity  ? Home Health   Complete by: As directed ?  ? To provide the following care/treatments: PT  ? Increase activity slowly   Complete by: As directed ?  ? Walker rolling   Complete by: As directed ?  ? ?  ? ? ?Signed: ?01/19/2022, MD ?11/19/2021, 1:14 PM   ? ?

## 2021-11-20 LAB — VITAMIN B6: Vitamin B6: 5.9 ug/L (ref 3.4–65.2)

## 2021-11-26 ENCOUNTER — Telehealth: Payer: Self-pay | Admitting: *Deleted

## 2021-11-26 ENCOUNTER — Ambulatory Visit (INDEPENDENT_AMBULATORY_CARE_PROVIDER_SITE_OTHER): Payer: Self-pay | Admitting: Student

## 2021-11-26 VITALS — BP 113/70 | HR 76 | Temp 98.1°F | Ht 62.0 in | Wt 185.8 lb

## 2021-11-26 DIAGNOSIS — J9601 Acute respiratory failure with hypoxia: Secondary | ICD-10-CM

## 2021-11-26 DIAGNOSIS — E871 Hypo-osmolality and hyponatremia: Secondary | ICD-10-CM | POA: Insufficient documentation

## 2021-11-26 DIAGNOSIS — F1010 Alcohol abuse, uncomplicated: Secondary | ICD-10-CM

## 2021-11-26 DIAGNOSIS — Z720 Tobacco use: Secondary | ICD-10-CM

## 2021-11-26 DIAGNOSIS — J45909 Unspecified asthma, uncomplicated: Secondary | ICD-10-CM

## 2021-11-26 NOTE — Patient Instructions (Signed)
[name], it was a pleasure seeing you today! ? ?Today we discussed: ?- Peak flow meter: I would like for you to use this a few times weekly. When you do this, inhale as much as you can, then blow as hard as you can. Do this 2-3 times and write down the highest number. Keep a log of your values. When you do not feel well, you can bring this back to clinic so we can evaluate you in person. ? ?- Continue with other medications.  ? ?Follow-up: 6 months  ? ?Please make sure to arrive 15 minutes prior to your next appointment. If you arrive late, you may be asked to reschedule.  ? ?We look forward to seeing you next time. Please call our clinic at [phone] if you have any questions or concerns. The best time to call is Monday-Friday from 9am-4pm, but there is someone available 24/7. If after hours or the weekend, call the main hospital number and ask for the Internal Medicine Resident On-Call. If you need medication refills, please notify your pharmacy one week in advance and they will send [place] a request. ? ?Thank you for letting us take part in your care. Wishing you the best! ? ?Thank you, ?Evlyn Kanner, MD ? ?

## 2021-11-26 NOTE — Progress Notes (Signed)
? ?CC: hospital follow-up ? ?HPI: ? ?[name] is a 31 y.o. person with medical history as below presenting to [facility] for hospital follow-up. ? ?Please see problem-based list for further details, assessments, and plans. ? ?Past Medical History:  ?Diagnosis Date  ? Alcohol abuse   ? Allergic rhinitis, seasonal   ? Dyspnea   ? with asthma  ? Eczema   ? GERD (gastroesophageal reflux disease)   ? 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 multiple pe's with pneumnia ;  completed xarelto treated 05/ 2015  ? History of trichomoniasis   ? Left ovarian cyst   ? Mild intermittent asthma   ? followed by pcp   (05-31-2020  per pt last exacerbation 11-09-2019 ED visit)  ? Obesity (BMI 30-39.9)   ? Pelvic adhesive disease   ? Pneumonia   ? ?Review of Systems:  As per HPI ? ?Physical Exam: ? ?Vitals:  ? 11/26/21 1008  ?BP: 113/70  ?Pulse: 76  ?Temp: 98.1 ?F (36.7 ?C)  ?TempSrc: Oral  ?SpO2: 100%  ?Weight: 185 lb 12.8 oz (84.3 kg)  ?Height: 5\' 2"  (1.575 m)  ? ?General: Resting comfortably in no acute distress ?CV: Regular rate, rhythm. No murmurs appreciated ?Pulm: Normal respiratory effort on room air. Clear to ausculation bilaterally. ?Skin: Warm, dry. No rashes or lesions appreciated ?Neuro: Awake, alert, conversing appropriately. ?Psych: Normal mood, affect, speech. ? ?Assessment & Plan:  ? ?Acute hypoxemic respiratory failure (HCC) due to status asthmaticus ?Patient is presenting today after recent hospitalization for acute hypoxemic respiratory failure due to status asthmaticus. During this hospitalization, patient required intubation due to worsening hypoxia. She was also treated for community-acquired pneumonia during her stay. She was discharged with steroid taper along with daily maintenance inhaler. ? ?Since discharge, patient reports she is doing well. She has not had any issues with breathing, reports her respiratory status feels similar to prior. Denies cough, fever, chills, body aches. Notes  compliance with daily inhaler, has not had to use albuterol since discharge. She also notes she has not smoked or drank alcohol since discharge.  ? ?Patient to continue with steroid taper as prescribed along with maintenance inhalers.  ? ?- Continue prednisone taper ?- Continue Dulera daily ?- Continue daily Singulair ?- Albuterol as needed ? ?Tobacco abuse ?Patient reports significant history of tobacco use. Mentions that she smokes black and milds, usually uses one pack over two days. She has not smoked since discharge. Notes she has been thinking about quitting and has tried to quit in the past. She has been decreasing the amount of cigarettes she has been using over the last few months.  ? ?Patient reports she would like to quit, although she is not interested in nicotine replacement therapy or other pharmacotherapy at this time. Will continue to monitor.   ? ?Alcohol abuse ?Patient reports she had previously been drinking ~5 shots of liquor daily. Notes she has only been doing this for the last few years and knows she needs to decrease the amount she is drinking. Denies any recent withdrawal symptoms or seizures. Currently is not interested in medication to help reduce cravings. Will need to continue to monitor.  ? ?Asthma ?During visit today, discussed continuing maintenance inhaler along with as needed albuterol. Patient was also given peak flow meter for her to use. Counseled patient on proper technique and logging values. Patient verbalized understanding. ? ?- Peak flow meter ? daily ?- Singulair daily ?- Albuterol PRN ? ?Hyponatremia ?Patient found to  be hyponatremic while hospitalized, unclear etiology. Patient currently asymptomatic. We will repeat BMP today to further evaluate.  ? ? ?Patient discussed with Dr.  Lafonda Mosses ? ?Evlyn Kanner, MD ?Internal Medicine PGY-2 ?Pager: [phone] ? ?

## 2021-11-26 NOTE — Assessment & Plan Note (Signed)
Patient reports significant history of tobacco use. Mentions that she smokes black and milds, usually uses one pack over two days. She has not smoked since discharge. Notes she has been thinking about quitting and has tried to quit in the past. She has been decreasing the amount of cigarettes she has been using over the last few months.  ? ?Patient reports she would like to quit, although she is not interested in nicotine replacement therapy or other pharmacotherapy at this time. Will continue to monitor.   ?

## 2021-11-26 NOTE — Assessment & Plan Note (Signed)
Patient found to be hyponatremic while hospitalized, unclear etiology. Patient currently asymptomatic. We will repeat BMP today to further evaluate.  ?

## 2021-11-26 NOTE — Assessment & Plan Note (Addendum)
Patient is presenting today after recent hospitalization for acute hypoxemic respiratory failure due to status asthmaticus. During this hospitalization, patient required intubation due to worsening hypoxia. She was also treated for community-acquired pneumonia during her stay. She was discharged with steroid taper along with daily maintenance inhaler. ? ?Since discharge, patient reports she is doing well. She has not had any issues with breathing, reports her respiratory status feels similar to prior. Denies cough, fever, chills, body aches. Notes compliance with daily inhaler, has not had to use albuterol since discharge. She also notes she has not smoked or drank alcohol since discharge.  ? ?Patient to continue with steroid taper as prescribed along with maintenance inhalers.  ? ?- Continue prednisone taper ?- Continue Dulera daily ?- Continue daily Singulair ?- Albuterol as needed ?

## 2021-11-26 NOTE — Assessment & Plan Note (Signed)
Patient reports she had previously been drinking ~5 shots of liquor daily. Notes she has only been doing this for the last few years and knows she needs to decrease the amount she is drinking. Denies any recent withdrawal symptoms or seizures. Currently is not interested in medication to help reduce cravings. Will need to continue to monitor.  ?

## 2021-11-26 NOTE — Assessment & Plan Note (Signed)
During visit today, discussed continuing maintenance inhaler along with as needed albuterol. Patient was also given peak flow meter for her to use. Counseled patient on proper technique and logging values. Patient verbalized understanding. ? ?- Peak flow meter ?Elwin Sleight daily ?- Singulair daily ?- Albuterol PRN ?

## 2021-11-26 NOTE — Telephone Encounter (Signed)
Received call from Karie Fetch, PT with [facility]. States she did an assessment with patient today and determined she does not need PT. States she was able to climb 3 flights of stairs with no change in VS except elevated HR. States she is fine with patient going back to work. ?

## 2021-11-27 LAB — BMP8+ANION GAP
Anion Gap: 20 mmol/L — ABNORMAL HIGH (ref 10.0–18.0)
BUN/Creatinine Ratio: 14 (ref 9–23)
BUN: 10 mg/dL (ref 6–20)
CO2: 24 mmol/L (ref 20–29)
Calcium: 9.6 mg/dL (ref 8.7–10.2)
Chloride: 96 mmol/L (ref 96–106)
Creatinine, Ser: 0.74 mg/dL (ref 0.57–1.00)
Glucose: 95 mg/dL (ref 70–99)
Potassium: 4.4 mmol/L (ref 3.5–5.2)
Sodium: 140 mmol/L (ref 134–144)
eGFR: 111 mL/min/1.73

## 2021-11-27 NOTE — Progress Notes (Signed)
Internal Medicine Clinic Attending ? ?Case discussed with Dr. Braswell  At the time of the visit.  We reviewed the resident?s history and exam and pertinent patient test results.  I agree with the assessment, diagnosis, and plan of care documented in the resident?s note.  ?

## 2021-12-10 ENCOUNTER — Inpatient Hospital Stay (INDEPENDENT_AMBULATORY_CARE_PROVIDER_SITE_OTHER): Payer: Self-pay | Admitting: Primary Care

## 2021-12-10 ENCOUNTER — Other Ambulatory Visit (HOSPITAL_COMMUNITY): Payer: Self-pay

## 2021-12-11 ENCOUNTER — Other Ambulatory Visit: Payer: Self-pay

## 2021-12-11 ENCOUNTER — Other Ambulatory Visit (HOSPITAL_COMMUNITY): Payer: Self-pay

## 2021-12-11 ENCOUNTER — Ambulatory Visit (INDEPENDENT_AMBULATORY_CARE_PROVIDER_SITE_OTHER): Payer: Self-pay | Admitting: Pulmonary Disease

## 2021-12-11 ENCOUNTER — Encounter: Payer: Self-pay | Admitting: Pulmonary Disease

## 2021-12-11 VITALS — BP 108/70 | HR 74 | Ht 62.0 in | Wt 196.0 lb

## 2021-12-11 DIAGNOSIS — J4550 Severe persistent asthma, uncomplicated: Secondary | ICD-10-CM | POA: Insufficient documentation

## 2021-12-11 NOTE — Progress Notes (Signed)
? ? ?Subjective:  ? ?PATIENT ID: Kara Haynes GENDER: female DOB: [birth year], MRN: [id number] ? ? ?HPI ? ?Chief Complaint  ?Patient presents with  ? Hospitalization Follow-up  ?  Asthma   ? ? ?Reason for Visit: Hospital follow-up ? ?Ms. Kara Haynes is 31 year old female former smoker with asthma, hx alcohol abuse who presents for hospital follow-up and establish Pulmonary care. ? ?She was recently hospitalized for status asthmaticus secondary to CAP requiring intubation. She was discharged on Dulera and Singulair. Has not needed her rescue inhaler. Quit smoking after hospitalization. Denies any symptoms of shortness of breath, cough or wheezing since being compliant with her inhalers. Denies nocturnal symptoms. Has not used rescue inhalers. Has restarted work. Unable to afford her inhalers (Symbicort). ? ?Social History: ?Former smoker. Quit in 11/2021 ?[name] for [employer] ? ?I have personally reviewed patient's past medical/family/social history, allergies, current medications. ? ?Past Medical History:  ?Diagnosis Date  ? Alcohol abuse   ? Allergic rhinitis, seasonal   ? Dyspnea   ? with asthma  ? Eczema   ? GERD (gastroesophageal reflux disease)   ? 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 multiple pe's with pneumnia ;  completed xarelto treated 05/ 2015  ? History of trichomoniasis   ? Left ovarian cyst   ? Mild intermittent asthma   ? followed by pcp   (05-31-2020  per pt last exacerbation 11-09-2019 ED visit)  ? Obesity (BMI 30-39.9)   ? Pelvic adhesive disease   ? Pneumonia   ?  ? ?Family History  ?Problem Relation Age of Onset  ? Other Other   ?     denies family h/o clotting d/o  ? Asthma Maternal Grandfather   ? Hypertension Maternal Grandfather   ? Healthy Mother   ? Healthy Father   ?  ? ?Social History  ? ?Occupational History  ? Not on file  ?Tobacco Use  ? Smoking status: Every Day  ?  Packs/day: 0.50  ?  Years: 12.00  ?  Pack years: 6.00  ?  Types: Cigarettes  ?  Smokeless tobacco: Never  ? Tobacco comments:  ?  12/11/21 states she has stopped smoking since going into the hospital  ?Vaping Use  ? Vaping Use: Never used  ?Substance and Sexual Activity  ? Alcohol use: Yes  ?  Alcohol/week: 0.0 standard drinks  ? Drug use: Yes  ?  Types: Marijuana  ? Sexual activity: Yes  ?  Birth control/protection: None  ? ? ?Allergies  ?Allergen Reactions  ? Fish Allergy Hives, Shortness Of Breath and Swelling  ?  Mostly tilapia  ?  ? ?Outpatient Medications Prior to Visit  ?Medication Sig Dispense Refill  ? amLODipine (NORVASC) 5 MG tablet Take 1 tablet (5 mg total) by mouth daily. 30 tablet 2  ? cyanocobalamin 1000 MCG tablet Take 1 tablet (1,000 mcg total) by mouth daily. 30 tablet 2  ? loratadine (CLARITIN) 10 MG tablet Take 1 tablet (10 mg total) by mouth daily. 30 tablet 2  ? mometasone-formoterol (DULERA) 200-5 MCG/ACT AERO Inhale 2 puffs into the lungs 2 (two) times daily. 13 g 2  ? montelukast (SINGULAIR) 10 MG tablet Take 1 tablet (10 mg total) by mouth at bedtime. 30 tablet 2  ? acetaminophen (TYLENOL) 500 MG tablet Take 500 mg by mouth every 6 (six) hours as needed for mild pain or moderate pain. (Patient not taking: Reported on 12/11/2021)    ? albuterol (VENTOLIN HFA) 108 (90 Base)  MCG/ACT inhaler Inhale 2 puffs into the lungs every 6 (six) hours as needed for wheezing or shortness of breath. (Patient not taking: Reported on 12/11/2021) 18 g 2  ? ?No facility-administered medications prior to visit.  ? ? ?Review of Systems  ?Constitutional:  Negative for chills, diaphoresis, fever, malaise/fatigue and weight loss.  ?HENT:  Negative for congestion.   ?Respiratory:  Negative for cough, hemoptysis, sputum production, shortness of breath and wheezing.   ?Cardiovascular:  Negative for chest pain, palpitations and leg swelling.  ? ? ?Objective:  ? ?Vitals:  ? 12/11/21 1136  ?BP: 108/70  ?Pulse: 74  ?SpO2: 99%  ?Weight: 196 lb (88.9 kg)  ?Height: 5\' 2"  (1.575 m)  ? ?SpO2: 99 % ?O2  Device: None (Room air) ? ?Physical Exam: ?General: Well-appearing, no acute distress ?HENT: [place], AT ?Eyes: EOMI, no scleral icterus ?Respiratory: Clear to auscultation bilaterally.  No crackles, wheezing or rales ?Cardiovascular: RRR, -M/R/G, no JVD ?Extremities:-Edema,-tenderness ?Neuro: AAO x4, CNII-XII grossly intact ?Psych: Normal mood, normal affect ? ?Data Reviewed: ? ?Imaging: ?CTA 11/10/21 - No PE. Few patchy GGO including RUL ?CXR 11/16/21 - Improved RUL aeration. ETT in place ? ?PFT: ?None on file ? ?Labs: ?CBC ?   ?Component Value Date/Time  ? WBC 10.0 11/19/2021 0058  ? RBC 3.86 (L) 11/19/2021 0058  ? HGB 12.7 11/19/2021 0058  ? HCT 37.5 11/19/2021 0058  ? PLT 320 11/19/2021 0058  ? MCV 97.2 11/19/2021 0058  ? MCH 32.9 11/19/2021 0058  ? MCHC 33.9 11/19/2021 0058  ? RDW 13.9 11/19/2021 0058  ? LYMPHSABS 0.4 (L) 11/12/2021 0135  ? MONOABS 0.2 11/12/2021 0135  ? EOSABS 0.0 11/12/2021 0135  ? BASOSABS 0.0 11/12/2021 0135  ? ?Absolute eos 10/17/21 - 0 ? ?   ?Assessment & Plan:  ? ?Discussion: ?31 year old female former smoker with asthma, hx alcohol abuse who presents for management of asthma/hospital follow-up. Discussed clinical course and management of asthma including bronchodilator regimen and action plan for exacerbation. Discussed risk of worsening pulmonary infection and asthma if she restarts smoking. ? ?Severe persistent asthma - well controlled ?--CONTINUE Dulera 200-5 mcg TWO puffs TWICE a day ?--Use Albuterol AS NEEDED for shortness of breath or wheezing ?--I will message pharmacy team regarding affordable options for inhalers ? ?Tobacco abuse ?Patient quit smoking however concerned for relapse. ?We discussed smoking cessation for 5 minutes. We discussed triggers and stressors and ways to deal with them. We discussed barriers to continued smoking and benefits of smoking cessation. Provided patient with information cessation techniques and interventions  ? ?Hypertension ?--Possibly steroid  induced ?--OK to hold on amlodipine ?--Please check blood pressure daily ?--If top number is >130, please restart amlodipine and contact your primary care doctor for refill ? ?Health Maintenance ?Immunization History  ?Administered Date(s) Administered  ? Influenza,inj,Quad PF,6+ Mos 08/11/2014, 10/27/2015  ? Pneumococcal Polysaccharide-23 10/27/2015  ? Tdap 12/28/2019  ? ?CT Lung Screen - insufficient tobacco history ? ?No orders of the defined types were placed in this encounter. ?No orders of the defined types were placed in this encounter. ? ? ?Return in about 6 weeks (around 01/22/2022). ? ?I have spent a total time of -minutes on the day of the appointment reviewing prior documentation, coordinating care and discussing medical diagnosis and plan with the patient/family. Imaging, labs and tests included in this note have been reviewed and interpreted independently by me. ? ?Adlene Adduci 03/24/2022, MD ?[facility] ?12/11/2021 7:31 PM  ?Office Number [phone] ? ? ?

## 2021-12-11 NOTE — Patient Instructions (Addendum)
Severe persistent asthma ?--CONTINUE Dulera 200-5 mcg TWO puffs TWICE a day ?--Use Albuterol AS NEEDED for shortness of breath or wheezing ?--I will message pharmacy team regarding affordable options for inhalers ? ?Asthma Action Plan ?UseAlbuterol 2 puffs for worsening shortness of breath, wheezing and cough. If you symptoms do not improve in 24-48 hours, please our office for evaluation and/or prednisone taper. ? ?Hypertension ?--Possibly steroid induced ?--OK to hold on amlodipine ?--Please check blood pressure daily ?--If top number is >130, please restart amlodipine and contact your primary care doctor for refill ? ?Follow-up with me in 6 weeks ?

## 2021-12-12 ENCOUNTER — Other Ambulatory Visit (HOSPITAL_COMMUNITY): Payer: Self-pay

## 2021-12-12 ENCOUNTER — Telehealth: Payer: Self-pay | Admitting: Pulmonary Disease

## 2021-12-12 MED ORDER — FLUTICASONE-SALMETEROL 250-50 MCG/ACT IN AEPB
1.0000 | INHALATION_SPRAY | Freq: Two times a day (BID) | RESPIRATORY_TRACT | 5 refills | Status: DC
Start: 2021-12-12 — End: 2022-01-07
  Filled 2021-12-12 – 2022-01-07 (×4): qty 60, 30d supply, fill #0

## 2021-12-12 NOTE — Telephone Encounter (Signed)
Discussed inhaler options for self-pay with patient.  ? ?--Will prescribe Advair Diskus 250-50 mcg ONE puff TWICE a day ?--Please contact patient for patient assistance for Symbicort ? ?

## 2021-12-12 NOTE — Telephone Encounter (Signed)
Called and spoke with patient about patient assistance forms for [place] instead. Printed out forms and mailed them per patients request. Informed patient that Dr Loanne Drilling sent in [place] 1 pull twice daily.  ? ?Nothing further needed at this itme  ?

## 2021-12-12 NOTE — Telephone Encounter (Signed)
-----   Message from Ricke Hey, CPhT sent at 12/12/2021  9:41 AM EDT ----- ?Regarding: RE: Inhaler options ?Good morning! Self-pay results are as follows: ?Advair Diskus: $140.18 ?Symbicort: $421.67 ?Dulera: $370.60 ?Breo: $426.88 ?----- Message ----- ?From: Luciano Cutter, MD ?Sent: 12/11/2021   7:27 PM EDT ?To: Rx Prior Auth Team ?Subject: Inhaler options                               ? ?What would be the lowest cost ICS/LABA for self pay at [facility] pharmacies? ? ?----- Message ----- ?From: Ricke Hey, CPhT ?Sent: 12/11/2021   1:32 PM EDT ?To: Shivansh Hardaway Mechele Collin, MD, Rx Prior Auth Team ? ?The cash pay amounts would only be for [facility] pharmacies. The prices in other retail pharmacies would be different ? ?----- Message ----- ?From: Luciano Cutter, MD ?Sent: 12/11/2021  12:06 PM EDT ?To: Rx Prior Auth Team ? ?Need assistance with inhaler options for patient for ICS/LABA. She is self pay. Is community health the only option to send her to? ? ? ? ?

## 2021-12-13 ENCOUNTER — Other Ambulatory Visit (HOSPITAL_COMMUNITY): Payer: Self-pay

## 2021-12-18 ENCOUNTER — Other Ambulatory Visit (HOSPITAL_COMMUNITY): Payer: Self-pay

## 2021-12-31 ENCOUNTER — Telehealth: Payer: Self-pay | Admitting: Pulmonary Disease

## 2022-01-01 NOTE — Telephone Encounter (Signed)
Called and spoke with pt letting her know that Rx for Advair was sent to the pharmacy for her as it was cheaper than Dulera but let her know that we have mailed her pt assistance forms for her to fill out for the [facility] and she verbalized understanding. Nothing further needed. ?

## 2022-01-01 NOTE — Telephone Encounter (Signed)
[facility] Patient Assistance Program instructions were mailed back to the office- corrected the address and mailed.  ?

## 2022-01-07 ENCOUNTER — Telehealth: Payer: Self-pay | Admitting: *Deleted

## 2022-01-07 ENCOUNTER — Other Ambulatory Visit (HOSPITAL_COMMUNITY): Payer: Self-pay

## 2022-01-07 MED ORDER — AMLODIPINE BESYLATE 5 MG PO TABS
5.0000 mg | ORAL_TABLET | Freq: Every day | ORAL | 2 refills | Status: DC
  Filled 2022-01-07: qty 30, 30d supply, fill #0
  Filled 2022-04-09 – 2022-05-07 (×2): qty 30, 30d supply, fill #1
  Filled 2022-06-09 – 2022-06-17 (×2): qty 30, 30d supply, fill #2

## 2022-01-07 MED ORDER — FLUTICASONE-SALMETEROL 250-50 MCG/ACT IN AEPB
1.0000 | INHALATION_SPRAY | Freq: Two times a day (BID) | RESPIRATORY_TRACT | 5 refills | Status: DC
  Filled 2022-01-07: qty 60, 30d supply, fill #0

## 2022-01-07 MED ORDER — ALBUTEROL SULFATE HFA 108 (90 BASE) MCG/ACT IN AERS
2.0000 | INHALATION_SPRAY | Freq: Four times a day (QID) | RESPIRATORY_TRACT | 2 refills | Status: DC | PRN
  Filled 2022-01-07 – 2022-05-22 (×3): qty 18, 25d supply, fill #0

## 2022-01-07 MED ORDER — LORATADINE 10 MG PO TABS
10.0000 mg | ORAL_TABLET | Freq: Every day | ORAL | 2 refills | Status: AC
  Filled 2022-01-07 – 2022-12-30 (×6): qty 30, 30d supply, fill #0

## 2022-01-07 MED ORDER — CYANOCOBALAMIN 1000 MCG PO TABS
1000.0000 ug | ORAL_TABLET | Freq: Every day | ORAL | 2 refills | Status: AC
  Filled 2022-01-07 – 2022-11-03 (×4): qty 30, 30d supply, fill #0

## 2022-01-07 MED ORDER — MONTELUKAST SODIUM 10 MG PO TABS
10.0000 mg | ORAL_TABLET | Freq: Every day | ORAL | 2 refills | Status: DC
  Filled 2022-01-07: qty 30, 30d supply, fill #0
  Filled 2022-02-27: qty 30, 30d supply, fill #1
  Filled 2022-03-30: qty 30, 30d supply, fill #2

## 2022-01-07 NOTE — Telephone Encounter (Signed)
Received message from [place] at [facility] requesting all meds be resent under IM Program. ?

## 2022-01-07 NOTE — Telephone Encounter (Signed)
Kara Haynes calling back for refills with IM Program ?

## 2022-01-08 ENCOUNTER — Other Ambulatory Visit (HOSPITAL_COMMUNITY): Payer: Self-pay

## 2022-01-28 ENCOUNTER — Ambulatory Visit (INDEPENDENT_AMBULATORY_CARE_PROVIDER_SITE_OTHER): Payer: Self-pay | Admitting: Pulmonary Disease

## 2022-01-28 ENCOUNTER — Encounter: Payer: Self-pay | Admitting: Pulmonary Disease

## 2022-01-28 ENCOUNTER — Other Ambulatory Visit (HOSPITAL_COMMUNITY): Payer: Self-pay

## 2022-01-28 VITALS — BP 124/80 | HR 81 | Ht 62.0 in | Wt 204.2 lb

## 2022-01-28 DIAGNOSIS — J4550 Severe persistent asthma, uncomplicated: Secondary | ICD-10-CM

## 2022-01-28 DIAGNOSIS — Z720 Tobacco use: Secondary | ICD-10-CM

## 2022-01-28 MED ORDER — FLUTICASONE-SALMETEROL 250-50 MCG/ACT IN AEPB
1.0000 | INHALATION_SPRAY | Freq: Two times a day (BID) | RESPIRATORY_TRACT | 5 refills | Status: DC
  Filled 2022-01-28 – 2022-02-27 (×2): qty 60, 30d supply, fill #0
  Filled 2022-03-30: qty 60, 30d supply, fill #1
  Filled 2022-04-23 – 2022-05-07 (×2): qty 60, 30d supply, fill #2
  Filled 2022-06-09 – 2022-06-17 (×2): qty 60, 30d supply, fill #3
  Filled 2022-07-16: qty 60, 30d supply, fill #4
  Filled 2022-08-15 – 2022-09-02 (×2): qty 60, 30d supply, fill #5

## 2022-01-28 NOTE — Progress Notes (Signed)
? ? ?Subjective:  ? ?PATIENT ID: Kara Haynes GENDER: female DOB: 02-18-1991, MRN: [id number] ? ? ?HPI ? ?Chief Complaint  ?Patient presents with  ? Follow-up  ?  asthma  ? ? ?Reason for Visit: Follow-up ? ?Ms. Kara Haynes is 31 year old female former smoker with asthma, hx alcohol abuse who presents for hospital follow-up and establish Pulmonary care. ? ?Initial consult ?She was recently hospitalized for status asthmaticus secondary to CAP requiring intubation. She was discharged on Dulera and Singulair. Has not needed her rescue inhaler. Quit smoking after hospitalization. Denies any symptoms of shortness of breath, cough or wheezing since being compliant with her inhalers. Denies nocturnal symptoms. Has not used rescue inhalers. Has restarted work. Unable to afford her inhalers (Symbicort). ? ?01/28/22 ?Since her last visit she was prescribed Advair which is more affordable based on her insurance. Denies coughing, wheezing or shortness of breath. Denies nocturnal symptoms. Compliant with her Advair. Rarely uses albuterol, once since hospital discharge during activity. She is smoking 5-6 cigarettes daily. ? ?Asthma Control Test ACT Total Score  ?01/28/2022 ?10:33 AM 24  ? ?Social History: ?Former smoker. Quit in 11/2021. Relapsed to 1/4 ppd in 01/2022 ?Kara Haynes for [employer] ? ?Past Medical History:  ?Diagnosis Date  ? Alcohol abuse   ? Allergic rhinitis, seasonal   ? Dyspnea   ? with asthma  ? Eczema   ? GERD (gastroesophageal reflux disease)   ? 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 multiple pe's with pneumnia ;  completed xarelto treated 05/ 2015  ? History of trichomoniasis   ? Left ovarian cyst   ? Mild intermittent asthma   ? followed by pcp   (05-31-2020  per pt last exacerbation 11-09-2019 ED visit)  ? Obesity (BMI 30-39.9)   ? Pelvic adhesive disease   ? Pneumonia   ?  ? ?Allergies  ?Allergen Reactions  ? Fish Allergy Hives, Shortness Of Breath and Swelling  ?  Mostly  tilapia  ?  ? ?Outpatient Medications Prior to Visit  ?Medication Sig Dispense Refill  ? amLODipine (NORVASC) 5 MG tablet Take 1 tablet (5 mg total) by mouth daily. 30 tablet 2  ? cyanocobalamin 1000 MCG tablet Take 1 tablet (1,000 mcg total) by mouth daily. 30 tablet 2  ? loratadine (CLARITIN) 10 MG tablet Take 1 tablet (10 mg total) by mouth daily. 30 tablet 2  ? montelukast (SINGULAIR) 10 MG tablet Take 1 tablet (10 mg total) by mouth at bedtime. 30 tablet 2  ? fluticasone-salmeterol (ADVAIR DISKUS) 250-50 MCG/ACT AEPB Inhale 1 puff into the lungs in the morning and at bedtime. 60 each 5  ? acetaminophen (TYLENOL) 500 MG tablet Take 500 mg by mouth every 6 (six) hours as needed for mild pain or moderate pain.    ? albuterol (VENTOLIN HFA) 108 (90 Base) MCG/ACT inhaler Inhale 2 puffs into the lungs every 6 (six) hours as needed for wheezing or shortness of breath. (Patient not taking: Reported on 01/28/2022) 18 g 2  ? ?No facility-administered medications prior to visit.  ? ? ?Review of Systems  ?Constitutional:  Negative for chills, diaphoresis, fever, malaise/fatigue and weight loss.  ?HENT:  Negative for congestion.   ?Respiratory:  Negative for cough, hemoptysis, sputum production, shortness of breath and wheezing.   ?Cardiovascular:  Negative for chest pain, palpitations and leg swelling.  ? ? ?Objective:  ? ?Vitals:  ? 01/28/22 1033  ?BP: 124/80  ?Pulse: 81  ?SpO2: 100%  ?Weight: 204 lb 3.2 oz (  92.6 kg)  ?Height: 5\' 2"  (1.575 m)  ? ?SpO2: 100 % ?O2 Device: None (Room air) ? ?Physical Exam: ?General: Well-appearing, no acute distress ?HENT: [place], AT ?Eyes: EOMI, no scleral icterus ?Respiratory: Clear to auscultation bilaterally.  No crackles, wheezing or rales ?Cardiovascular: RRR, -M/R/G, no JVD ?Extremities:-Edema,-tenderness ?Neuro: AAO x4, CNII-XII grossly intact ?Psych: Normal mood, normal affect ? ?Data Reviewed: ? ?Imaging: ?CTA 11/10/21 - No PE. Few patchy GGO including RUL ?CXR 11/16/21 - Improved RUL  aeration. ETT in place ? ?PFT: ?None on file ? ?Labs: ?CBC ?   ?Component Value Date/Time  ? WBC 10.0 11/19/2021 0058  ? RBC 3.86 (L) 11/19/2021 0058  ? HGB 12.7 11/19/2021 0058  ? HCT 37.5 11/19/2021 0058  ? PLT 320 11/19/2021 0058  ? MCV 97.2 11/19/2021 0058  ? MCH 32.9 11/19/2021 0058  ? MCHC 33.9 11/19/2021 0058  ? RDW 13.9 11/19/2021 0058  ? LYMPHSABS 0.4 (L) 11/12/2021 0135  ? MONOABS 0.2 11/12/2021 0135  ? EOSABS 0.0 11/12/2021 0135  ? BASOSABS 0.0 11/12/2021 0135  ? ?Absolute eos 10/17/21 - 0 ? ?   ?Assessment & Plan:  ? ?Discussion: ?31 year old female former smoker with asthma, history of alcohol abuse who presents for follow-up for asthma. Discussed clinical course and management of asthma including bronchodilator regimen and action plan for exacerbation. Advised on smoking cessation. ? ?Severe persistent asthma - well controlled ?--CONTINUE Advair 250-50 mcg ONE puff TWICE a day ?--Use Albuterol AS NEEDED for shortness of breath or wheezing ? ?Tobacco abuse ?Patient quit smoking however concerned for relapse. ?Counseled <3 minutes ? ?Health Maintenance ?Immunization History  ?Administered Date(s) Administered  ? Influenza,inj,Quad PF,6+ Mos 08/11/2014, 10/27/2015  ? Pneumococcal Polysaccharide-23 10/27/2015  ? Tdap 12/28/2019  ? ?CT Lung Screen - insufficient tobacco history ? ?No orders of the defined types were placed in this encounter. ? ?Meds ordered this encounter  ?Medications  ? fluticasone-salmeterol (ADVAIR DISKUS) 250-50 MCG/ACT AEPB  ?  Sig: Inhale 1 puff into the lungs in the morning and at bedtime.  ?  Dispense:  60 each  ?  Refill:  5  ?  IM Program  ? ? ?Return in about 6 months (around 07/31/2022). ? ?I have spent a total time of 30-minutes on the day of the appointment reviewing prior documentation, coordinating care and discussing medical diagnosis and plan with the patient/family. Past medical history, allergies, medications were reviewed. Pertinent imaging, labs and tests included in  this note have been reviewed and interpreted independently by me. ? ?Kara Haynes 08/02/2022, MD ?[facility] ?01/28/2022 10:50 AM  ?Office Number [phone] ? ? ?

## 2022-01-28 NOTE — Patient Instructions (Signed)
?  Severe persistent asthma - well controlled ?--CONTINUE Advair 250-50 mcg ONE puff TWICE a day ?--Use Albuterol AS NEEDED for shortness of breath or wheezing ? ?Tobacco abuse ?Patient quit smoking however concerned for relapse. ?Counseled <3 minutes ? ?Follow-up with me in 6 months ?

## 2022-02-12 ENCOUNTER — Telehealth: Payer: Self-pay | Admitting: Pulmonary Disease

## 2022-02-14 NOTE — Telephone Encounter (Signed)
Spoke with the pt  She states that [facility] pharmacy told her that they do not offer assistance with advair any longer  She states she had been getting med a a lower copay  She states she never filled out pt assistance forms, and that whatever program she was using to help was not through drug company   Pharm team- do you have any idea what program she could be referring to? Or a different pharm she could get her advair cheaper?

## 2022-02-16 ENCOUNTER — Other Ambulatory Visit (HOSPITAL_COMMUNITY): Payer: Self-pay

## 2022-02-18 NOTE — Telephone Encounter (Signed)
Kara Haynes, Kara Haynes, CPhT to Me      1:19 AM Pt may have been on a MATCH discount program through the pharmacy, but it is no longer available. [employer] discontinued patient assistance on 6.1.23. I found another option called Rx[employer]reach, but patient would have to fill out patient assistance for it. Did not have active insurance for this patient    https://www.needymeds.org/papforms/[email or web address]   LMTCB

## 2022-02-25 ENCOUNTER — Encounter (HOSPITAL_COMMUNITY): Payer: Self-pay

## 2022-02-25 ENCOUNTER — Ambulatory Visit (HOSPITAL_COMMUNITY)
Admission: EM | Admit: 2022-02-25 | Discharge: 2022-02-25 | Disposition: A | Discharge status: Home / Self Care | Payer: Self-pay | Attending: Student | Admitting: Student

## 2022-02-25 ENCOUNTER — Other Ambulatory Visit (HOSPITAL_COMMUNITY): Payer: Self-pay

## 2022-02-25 DIAGNOSIS — G5603 Carpal tunnel syndrome, bilateral upper limbs: Secondary | ICD-10-CM

## 2022-02-25 MED ORDER — PREDNISONE 20 MG PO TABS
40.0000 mg | ORAL_TABLET | Freq: Every day | ORAL | 0 refills | Status: AC
  Filled 2022-02-25: qty 6, 3d supply, fill #0

## 2022-02-25 NOTE — ED Triage Notes (Signed)
Pt c/o rt hand numbness off and on for the past month. States woke up with rt hand numbness radiating up rt arm. Denies injury. Denies taking OTC meds.

## 2022-02-25 NOTE — ED Provider Notes (Signed)
[facility]    CSN: [id number] Arrival date & time: 02/25/22  1749      History   Chief Complaint Chief Complaint  Patient presents with   hand numbness    HPI Kara Haynes is a 31 y.o. female presenting with bilateral hand numbness over the palmar wrist and fingers 1-4, worse in the morning. Symptoms for approximately 1 month. Symptoms are worst in the morning and then improve throughout the day. Denies trauma but does endorse overuse/busy job. Has not attempted interventions at home. She is left handed.  HPI  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 ;  completed xarelto treated 05/ 2015   History of trichomoniasis    Left ovarian cyst    Mild intermittent asthma    followed by pcp   (05-31-2020  per pt last exacerbation 11-09-2019 ED visit)   Obesity (BMI 30-39.9)    Pelvic adhesive disease    Pneumonia     Patient Active Problem List   Diagnosis Date Noted   Severe persistent asthma without complication 12/11/2021   Endometriosis of left ovary 07/25/2020   Retroperitoneal fibrosis    Dysmenorrhea 06/13/2020   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   Left ovarian cyst 04/27/2020   Obesity (BMI 30-39.9) 04/27/2020   Alcohol abuse 03/17/2020   Hepatic steatosis 06/26/2016   Tobacco abuse 06/26/2016   History of pulmonary embolism 10/26/2015   Marijuana use 10/26/2015    Past Surgical History:  Procedure Laterality Date   ABDOMINAL HYSTERECTOMY     LAPAROSCOPIC OVARIAN CYSTECTOMY Left 06/05/2020   Procedure: LAPAROSCOPIC OVARIAN CYSTECTOMY;  Surgeon: Newcastle Bing, MD;  Location: [facility] [place];  Service: Gynecology;  Laterality: Left;   NO PAST SURGERIES     ROBOTIC ASSISTED TOTAL HYSTERECTOMY WITH BILATERAL SALPINGO  OOPHERECTOMY N/A 07/03/2020   Procedure: XI ROBOTIC ASSISTED  LEFT  SALPINGO OOPHORECTOMY/ LAPAROSCOPIC LYSIS OF ADHESIONS/LEFT URETEROLYSIS;  Surgeon: Adolphus Birchwood, MD;  Location: [facility];  Service: Gynecology;  Laterality: N/A;    OB History     Gravida  0   Para  0   Term  0   Preterm  0   AB  0   Living  0      SAB  0   IAB  0   Ectopic  0   Multiple  0   Live Births  0            Home Medications    Prior to Admission medications   Medication Sig Start Date End Date Taking? Authorizing Provider  predniSONE (DELTASONE) 20 MG tablet Take 2 tablets (40 mg total) by mouth daily for 3 days. Take with breakfast or lunch. Avoid NSAIDs (ibuprofen, etc) while taking this medication. 02/25/22 02/28/22 Yes Rhys Martini, PA-C  albuterol (VENTOLIN HFA) 108 (90 Base) MCG/ACT inhaler Inhale 2 puffs into the lungs every 6 (six) hours as needed for wheezing or shortness of breath. Patient not taking: Reported on 01/28/2022 01/07/22   Evlyn Kanner, MD  amLODipine (NORVASC) 5 MG tablet Take 1 tablet (5 mg total) by mouth daily. 01/07/22   Evlyn Kanner, MD  cyanocobalamin 1000 MCG tablet Take 1 tablet (1,000 mcg total) by mouth daily. 01/07/22   Evlyn Kanner, MD  fluticasone-salmeterol (ADVAIR DISKUS) 250-50 MCG/ACT AEPB Inhale 1 puff into the lungs in the morning and at bedtime. 01/28/22   Luciano Cutter, MD  loratadine (CLARITIN) 10 MG tablet Take 1 tablet (10 mg total) by mouth daily. 01/07/22   Evlyn Kanner, MD  montelukast (SINGULAIR) 10 MG tablet Take 1 tablet (10 mg total) by mouth at bedtime. 01/07/22   Evlyn Kanner, MD    Family History Family History  Problem Relation Age of Onset   Other Other        denies family h/o clotting d/o   Asthma Maternal Grandfather    Hypertension Maternal Grandfather    Healthy Mother    Healthy Father     Social History Social History   Tobacco Use   Smoking status: Some Days    Packs/day: 0.50    Years:  12.00    Total pack years: 6.00    Types: Cigarettes   Smokeless tobacco: Never   Tobacco comments:    12/11/21 states she has stopped smoking since going into the hospital  Vaping Use   Vaping Use: Never used  Substance Use Topics   Alcohol use: Yes    Alcohol/week: 0.0 standard drinks of alcohol   Drug use: Yes    Types: Marijuana     Allergies   Fish allergy   Review of Systems Review of Systems  Musculoskeletal:        Bilateral hand numbness and tingling.  All other systems reviewed and are negative.    Physical Exam Triage Vital Signs ED Triage Vitals  Enc Vitals Group     BP 02/25/22 1038 (!) 144/103     Pulse Rate 02/25/22 1038 83     Resp 02/25/22 1038 18     Temp 02/25/22 1038 98.5 F (36.9 C)     Temp Source 02/25/22 1038 Oral     SpO2 02/25/22 1038 98 %     Weight --      Height --      Head Circumference --      Peak Flow --      Pain Score 02/25/22 1040 0     Pain Loc --      Pain Edu? --      Excl. in GC? --    No data found.  Updated Vital Signs BP (!) 144/103 (BP Location: Left Arm)   Pulse 83   Temp 98.5 F (36.9 C) (Oral)   Resp 18   LMP 02/23/2022   SpO2 98%   Visual Acuity Right Eye Distance:   Left Eye Distance:   Bilateral Distance:    Right Eye Near:   Left Eye Near:    Bilateral Near:     Physical Exam Vitals reviewed.  Constitutional:      General: She is not in acute distress.    Appearance: Normal appearance. She is not ill-appearing.  HENT:     Head: Normocephalic and atraumatic.  Pulmonary:     Effort: Pulmonary effort is normal.  Musculoskeletal:     Comments: Bilateral hands: no skin changes or swelling. No tenderness to palpation. Negative finkelstein. Radial pulse 2+, cap refil <2 seconds, grip strength 5/5 bilaterally.  Neurological:     General: No focal deficit present.     Mental Status: She is alert and oriented to person, place, and time.  Psychiatric:        Mood and Affect: Mood normal.         Behavior: Behavior normal.  Thought Content: Thought content normal.        Judgment: Judgment normal.      UC Treatments / Results  Labs (all labs ordered are listed, but only abnormal results are displayed) Labs Reviewed - No data to display  EKG   Radiology No results found.  Procedures Procedures (including critical care time)  Medications Ordered in UC Medications - No data to display  Initial Impression / Assessment and Plan / UC Course  I have reviewed the triage vital signs and the nursing notes.  Pertinent labs & imaging results that were available during my care of the patient were reviewed by me and considered in my medical decision making (see chart for details).     This patient is a very pleasant 31 y.o. year old female presenting with bilateral carpal tunnel syndrome. Neurovascularly intact. Denies trauma but possible overuse. Symptoms are not reproducible during visit. Trial of OTC wrist braces and short course of prednisone. F/u with us or ortho if symptoms persist, ED return precautions discussed. Patient verbalizes understanding and agreement.   Final Clinical Impressions(s) / UC Diagnoses   Final diagnoses:  Carpal tunnel syndrome, bilateral     Discharge Instructions      -Prednisone, 2 pills taken at the same time for 3 days in a row.  Try taking this earlier in the day as it can give you energy. Avoid NSAIDs like ibuprofen and alleve while taking this medication as they can increase your risk of stomach upset and even GI bleeding when in combination with a steroid. You can continue tylenol (acetaminophen) up to 1000mg  3x daily. -Purchase some wrist braces and wear these at night. You can also wear during the day if symptoms persist -Follow-up with us or orthopedist if symptoms persist     ED Prescriptions     Medication Sig Dispense Auth. Provider   predniSONE (DELTASONE) 20 MG tablet Take 2 tablets (40 mg total) by mouth daily for 3  days. Take with breakfast or lunch. Avoid NSAIDs (ibuprofen, etc) while taking this medication. 6 tablet Rhys MartiniGraham, Chanae Gemma E, PA-C      PDMP not reviewed this encounter.   Rhys MartiniGraham, Lowery Paullin E, PA-C 02/25/22 1154

## 2022-02-25 NOTE — Discharge Instructions (Signed)
-  Prednisone, 2 pills taken at the same time for 3 days in a row.  Try taking this earlier in the day as it can give you energy. Avoid NSAIDs like ibuprofen and alleve while taking this medication as they can increase your risk of stomach upset and even GI bleeding when in combination with a steroid. You can continue tylenol (acetaminophen) up to 1000mg  3x daily. -Purchase some wrist braces and wear these at night. You can also wear during the day if symptoms persist -Follow-up with or orthopedist if symptoms persist

## 2022-02-27 ENCOUNTER — Other Ambulatory Visit (HOSPITAL_COMMUNITY): Payer: Self-pay

## 2022-03-13 NOTE — Telephone Encounter (Signed)
Due to trying to reach pt via phone with not having any success, sent pt a mychart message of response.

## 2022-03-31 ENCOUNTER — Other Ambulatory Visit (HOSPITAL_COMMUNITY): Payer: Self-pay

## 2022-04-09 ENCOUNTER — Other Ambulatory Visit (HOSPITAL_COMMUNITY): Payer: Self-pay

## 2022-04-17 ENCOUNTER — Other Ambulatory Visit (HOSPITAL_COMMUNITY): Payer: Self-pay

## 2022-04-23 ENCOUNTER — Other Ambulatory Visit (HOSPITAL_COMMUNITY): Payer: Self-pay

## 2022-04-23 ENCOUNTER — Other Ambulatory Visit: Payer: Self-pay | Admitting: Student

## 2022-04-23 MED ORDER — MONTELUKAST SODIUM 10 MG PO TABS
10.0000 mg | ORAL_TABLET | Freq: Every day | ORAL | 2 refills | Status: DC
  Filled 2022-04-23 – 2022-05-22 (×3): qty 30, 30d supply, fill #0
  Filled 2022-07-08: qty 30, 30d supply, fill #1
  Filled 2022-08-15 – 2022-09-02 (×2): qty 30, 30d supply, fill #2

## 2022-05-06 ENCOUNTER — Other Ambulatory Visit (HOSPITAL_COMMUNITY): Payer: Self-pay

## 2022-05-07 ENCOUNTER — Other Ambulatory Visit (HOSPITAL_COMMUNITY): Payer: Self-pay

## 2022-05-08 ENCOUNTER — Other Ambulatory Visit (HOSPITAL_COMMUNITY): Payer: Self-pay

## 2022-05-12 ENCOUNTER — Encounter: Payer: Self-pay | Admitting: Student

## 2022-05-16 ENCOUNTER — Other Ambulatory Visit (HOSPITAL_COMMUNITY): Payer: Self-pay

## 2022-05-22 ENCOUNTER — Other Ambulatory Visit: Payer: Self-pay | Admitting: Student

## 2022-05-22 ENCOUNTER — Other Ambulatory Visit (HOSPITAL_COMMUNITY): Payer: Self-pay

## 2022-05-22 MED ORDER — ALBUTEROL SULFATE HFA 108 (90 BASE) MCG/ACT IN AERS
2.0000 | INHALATION_SPRAY | Freq: Four times a day (QID) | RESPIRATORY_TRACT | 2 refills | Status: DC | PRN
  Filled 2022-05-22: qty 6.7, 25d supply, fill #0
  Filled 2022-11-03: qty 6.7, 25d supply, fill #1
  Filled 2022-12-30 (×2): qty 6.7, 25d supply, fill #2

## 2022-06-02 ENCOUNTER — Other Ambulatory Visit (HOSPITAL_COMMUNITY): Payer: Self-pay

## 2022-06-02 ENCOUNTER — Ambulatory Visit (INDEPENDENT_AMBULATORY_CARE_PROVIDER_SITE_OTHER): Payer: Self-pay | Admitting: Student

## 2022-06-02 ENCOUNTER — Encounter: Payer: Self-pay | Admitting: Student

## 2022-06-02 ENCOUNTER — Other Ambulatory Visit: Payer: Self-pay

## 2022-06-02 VITALS — BP 131/83 | HR 98 | Temp 98.1°F | Ht 62.0 in | Wt 197.3 lb

## 2022-06-02 DIAGNOSIS — J45901 Unspecified asthma with (acute) exacerbation: Secondary | ICD-10-CM

## 2022-06-02 DIAGNOSIS — Z0000 Encounter for general adult medical examination without abnormal findings: Secondary | ICD-10-CM

## 2022-06-02 DIAGNOSIS — F17210 Nicotine dependence, cigarettes, uncomplicated: Secondary | ICD-10-CM

## 2022-06-02 DIAGNOSIS — Z720 Tobacco use: Secondary | ICD-10-CM

## 2022-06-02 MED ORDER — PREDNISONE 20 MG PO TABS
40.0000 mg | ORAL_TABLET | Freq: Every day | ORAL | 0 refills | Status: DC
  Filled 2022-06-02: qty 10, 5d supply, fill #0

## 2022-06-02 NOTE — Patient Instructions (Signed)
[name], it was a pleasure seeing you today!  Today we discussed: - Cough: I'd like for you to take 5d of steroids for your cough. I heard some mild wheezing on my exam and would rather treat this early. Please return if symptoms do not improve.  I have ordered the following medication/changed the following medications:    Start the following medications: Meds ordered this encounter  Medications   predniSONE (DELTASONE) 20 MG tablet    Sig: Take 2 tablets (40 mg total) by mouth daily with breakfast for 5 days.    Dispense:  10 tablet    Refill:  0    IM program     Follow-up: 6 months   Please make sure to arrive 15 minutes prior to your next appointment. If you arrive late, you may be asked to reschedule.   We look forward to seeing you next time. Please call our clinic at [phone] if you have any questions or concerns. The best time to call is Monday-Friday from 9am-4pm, but there is someone available 24/7. If after hours or the weekend, call the main hospital number and ask for the Internal Medicine Resident On-Call. If you need medication refills, please notify your pharmacy one week in advance and they will send [place] a request.  Thank you for letting us take part in your care. Wishing you the best!  Thank you, Sanjuan Dame, MD

## 2022-06-03 DIAGNOSIS — J45901 Unspecified asthma with (acute) exacerbation: Secondary | ICD-10-CM | POA: Insufficient documentation

## 2022-06-03 NOTE — Assessment & Plan Note (Signed)
Patient reports she is smoking ~half pack of cigarettes daily. Mentions that she knows that she needs to quit and stop hanging out with certain people. It appears she is in the pre-contemplation/contemplation stage. Will continue to discuss with subsequent visits.

## 2022-06-03 NOTE — Assessment & Plan Note (Signed)
Mr. Kara Haynes is presenting today for 2d history of dry cough. This has been associated with mild congestion, but otherwise denies dyspnea, fevers, chills, nausea, vomiting, diarrhea. Reports multiple family members with similar symptoms during this time. Typically she does not have to use her albuterol inhaler, but she has used it daily the last few days.   On exam today, there are mild inspiratory wheezes in bilateral lower lung fields concerning for a mild asthma exacerbation, possibly precipitated by viral URI. We will give 5d course of steroids. Patient to return to clinic if symptoms do not improve.  - Prednisone 40mg  daily x5d - Continue Advair twice daily

## 2022-06-03 NOTE — Assessment & Plan Note (Addendum)
-   Patient declines influenza vaccine today, did recommend both COVID-19 and influenza vaccines at local pharmacy

## 2022-06-03 NOTE — Progress Notes (Signed)
   CC: follow-up, cough  HPI:  [name] is a 31 y.o. person with severe persistent asthma, polysubstance use presenting to [facility] for follow-up, acute cough.   Please see problem-based list for further details, assessments, and plans.  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 ;  completed xarelto treated 05/ 2015   History of trichomoniasis    Left ovarian cyst    Mild intermittent asthma    followed by pcp   (05-31-2020  per pt last exacerbation 11-09-2019 ED visit)   Obesity (BMI 30-39.9)    Pelvic adhesive disease    Pneumonia    Review of Systems:  As per HPI  Physical Exam:  Vitals:   06/02/22 0924  BP: 131/83  Pulse: 98  Temp: 98.1 F (36.7 C)  TempSrc: Oral  SpO2: 99%  Weight: 197 lb 4.8 oz (89.5 kg)  Height: 5\' 2"  (1.575 m)   General: Appears well, resting in chair in no acute distress CV: Regular rate, rhythm. No murmurs appreciated. Warm extremities. Pulm: Normal work of breathing on room air. Mild inspiratory wheezing in bilateral lower lung fields. Neuro: Awake, alert, conversing appropriately. Grossly non-focal. Psych: Normal mood, affect, speech.  Assessment & Plan:   Mild asthma exacerbation Mr. Kara Haynes is presenting today for 2d history of dry cough. This has been associated with mild congestion, but otherwise denies dyspnea, fevers, chills, nausea, vomiting, diarrhea. Reports multiple family members with similar symptoms during this time. Typically she does not have to use her albuterol inhaler, but she has used it daily the last few days.   On exam today, there are mild inspiratory wheezes in bilateral lower lung fields concerning for a mild asthma exacerbation, possibly precipitated by viral URI. We will give 5d course of  steroids. Patient to return to clinic if symptoms do not improve.  - Prednisone 40mg  daily x5d - Continue Advair twice daily  Tobacco abuse Patient reports she is smoking ~half pack of cigarettes daily. Mentions that she knows that she needs to quit and stop hanging out with certain people. It appears she is in the pre-contemplation/contemplation stage. Will continue to discuss with subsequent visits.   Healthcare maintenance - Patient declines influenza vaccine today, did recommend both COVID-19 and influenza vaccines at local pharmacy   Patient discussed with Dr. Lise Auer, MD Internal Medicine PGY-3 Pager: [phone]

## 2022-06-04 NOTE — Progress Notes (Signed)
Internal Medicine Clinic Attending ? ?Case discussed with Dr. Braswell  At the time of the visit.  We reviewed the resident?s history and exam and pertinent patient test results.  I agree with the assessment, diagnosis, and plan of care documented in the resident?s note.  ?

## 2022-06-09 ENCOUNTER — Other Ambulatory Visit (HOSPITAL_COMMUNITY): Payer: Self-pay

## 2022-06-17 ENCOUNTER — Other Ambulatory Visit (HOSPITAL_COMMUNITY): Payer: Self-pay

## 2022-07-09 ENCOUNTER — Other Ambulatory Visit (HOSPITAL_COMMUNITY): Payer: Self-pay

## 2022-07-16 ENCOUNTER — Other Ambulatory Visit (HOSPITAL_COMMUNITY): Payer: Self-pay

## 2022-08-15 ENCOUNTER — Other Ambulatory Visit (HOSPITAL_COMMUNITY): Payer: Self-pay

## 2022-08-15 ENCOUNTER — Other Ambulatory Visit: Payer: Self-pay | Admitting: Student

## 2022-08-15 MED ORDER — AMLODIPINE BESYLATE 5 MG PO TABS
5.0000 mg | ORAL_TABLET | Freq: Every day | ORAL | 2 refills | Status: DC
  Filled 2022-08-15 – 2022-09-02 (×2): qty 30, 30d supply, fill #0
  Filled 2022-11-03: qty 30, 30d supply, fill #1
  Filled 2022-12-30 (×2): qty 30, 30d supply, fill #2
  Filled 2023-06-04: qty 30, 30d supply, fill #3

## 2022-08-18 ENCOUNTER — Other Ambulatory Visit (HOSPITAL_COMMUNITY): Payer: Self-pay

## 2022-08-27 ENCOUNTER — Other Ambulatory Visit (HOSPITAL_COMMUNITY): Payer: Self-pay

## 2022-09-02 ENCOUNTER — Other Ambulatory Visit (HOSPITAL_COMMUNITY): Payer: Self-pay

## 2022-10-01 ENCOUNTER — Other Ambulatory Visit (HOSPITAL_COMMUNITY): Payer: Self-pay

## 2022-11-03 ENCOUNTER — Other Ambulatory Visit: Payer: Self-pay | Admitting: Student

## 2022-11-03 ENCOUNTER — Other Ambulatory Visit (HOSPITAL_COMMUNITY): Payer: Self-pay

## 2022-11-03 ENCOUNTER — Other Ambulatory Visit: Payer: Self-pay

## 2022-11-03 ENCOUNTER — Other Ambulatory Visit: Payer: Self-pay | Admitting: Pulmonary Disease

## 2022-11-04 ENCOUNTER — Other Ambulatory Visit (HOSPITAL_COMMUNITY): Payer: Self-pay

## 2022-11-04 ENCOUNTER — Other Ambulatory Visit: Payer: Self-pay

## 2022-11-04 MED ORDER — MONTELUKAST SODIUM 10 MG PO TABS
10.0000 mg | ORAL_TABLET | Freq: Every day | ORAL | 2 refills | Status: DC
  Filled 2022-11-04: qty 30, 30d supply, fill #0
  Filled 2022-12-30 (×2): qty 30, 30d supply, fill #1
  Filled 2023-03-29: qty 30, 30d supply, fill #2

## 2022-11-04 MED ORDER — FLUTICASONE-SALMETEROL 250-50 MCG/ACT IN AEPB
1.0000 | INHALATION_SPRAY | Freq: Two times a day (BID) | RESPIRATORY_TRACT | 5 refills | Status: DC
  Filled 2022-11-04: qty 60, 30d supply, fill #0
  Filled 2022-12-30 (×2): qty 60, 30d supply, fill #1
  Filled 2023-02-17: qty 60, 30d supply, fill #2
  Filled 2023-03-29: qty 60, 30d supply, fill #3
  Filled 2023-05-13 – 2023-06-05 (×3): qty 60, 30d supply, fill #4

## 2022-11-19 ENCOUNTER — Other Ambulatory Visit (HOSPITAL_COMMUNITY): Payer: Self-pay

## 2022-11-19 ENCOUNTER — Ambulatory Visit (INDEPENDENT_AMBULATORY_CARE_PROVIDER_SITE_OTHER): Payer: Self-pay | Admitting: Student

## 2022-11-19 VITALS — BP 124/75 | HR 100 | Temp 98.6°F | Ht 62.0 in | Wt 178.9 lb

## 2022-11-19 DIAGNOSIS — M25561 Pain in right knee: Secondary | ICD-10-CM

## 2022-11-19 DIAGNOSIS — J4550 Severe persistent asthma, uncomplicated: Secondary | ICD-10-CM

## 2022-11-19 DIAGNOSIS — F1010 Alcohol abuse, uncomplicated: Secondary | ICD-10-CM

## 2022-11-19 DIAGNOSIS — Z720 Tobacco use: Secondary | ICD-10-CM

## 2022-11-19 DIAGNOSIS — M25562 Pain in left knee: Secondary | ICD-10-CM

## 2022-11-19 DIAGNOSIS — F17210 Nicotine dependence, cigarettes, uncomplicated: Secondary | ICD-10-CM

## 2022-11-19 MED ORDER — DICLOFENAC SODIUM 1 % EX GEL
2.0000 g | Freq: Four times a day (QID) | CUTANEOUS | 0 refills | Status: DC
  Filled 2022-11-19: qty 100, 12d supply, fill #0
  Filled 2022-11-19 – 2023-05-13 (×2): qty 100, 15d supply, fill #0

## 2022-11-19 NOTE — Patient Instructions (Addendum)
Kara Haynes, it was a pleasure seeing you today!  Today we discussed: - I have prescribed voltaren gel (also called diclofenac) for your knees. If the prescription is too expensive, you can also get this over the counter at a pharmacy.   - Continue with Advair and as needed albuterol for your breathing.  - Please let us know if you would like any assistance with smoking.   Follow-up: 6 months   Please make sure to arrive 15 minutes prior to your next appointment. If you arrive late, you may be asked to reschedule.   We look forward to seeing you next time. Please call our clinic at [phone] if you have any questions or concerns. The best time to call is Monday-Friday from 9am-4pm, but there is someone available 24/7. If after hours or the weekend, call the main hospital number and ask for the Internal Medicine Resident On-Call. If you need medication refills, please notify your pharmacy one week in advance and they will send [place] a request.  Thank you for letting us take part in your care. Wishing you the best!  Thank you, Sanjuan Dame, MD

## 2022-11-20 DIAGNOSIS — M25561 Pain in right knee: Secondary | ICD-10-CM | POA: Insufficient documentation

## 2022-11-20 NOTE — Assessment & Plan Note (Signed)
Patient reports continued use of cigarettes, typically around half a pack daily. She would like to eventually stop, but stressors make this difficult. Specifically mentions her mother, which causes her anxiety. She would not like any nicotine replacement therapy or other medication for cessation. Would continue to monitor this moving forward.

## 2022-11-20 NOTE — Assessment & Plan Note (Signed)
Patient reports she has had increase in bilateral knee pain over the last few months. Describes achy, burning feelings in her legs by the end of the day. She works at [employer] as often has to bend down and stand back up throughout the day. She has not taken anything for the pain. Denies any fevers, chills, trauma, skin changes. On exam today, there are no effusions and the knees appear to be functioning well with normal active and passive range of motion. Most likely her pain is caused by overuse with her work. We will prescribe topical anti-inflammatory for this.  - Voltaren gel as needed

## 2022-11-20 NOTE — Assessment & Plan Note (Signed)
Patient has reduced alcohol intake to ~3 shots of liquor daily, down from 5 previously. She denies any abdominal pain, nausea, vomiting, or withdrawal symptoms. Would continue to monitor this for signs of increase use.

## 2022-11-20 NOTE — Assessment & Plan Note (Signed)
Patient reports she is doing well with current regimen of daily Advair and as needed albuterol. Since being on this regimen she has not needed to use albuterol. Denies any coughing, severe dyspnea. 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  - Continue Advair twice daily - Albuterol as needed

## 2022-11-20 NOTE — Progress Notes (Signed)
   CC: follow-up  HPI:  [name] is a 32 y.o. person with medical history as below presenting to [facility] for follow-up.  Please see problem-based list for further details, assessments, and plans.  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 ;  completed xarelto treated 05/ 2015   History of trichomoniasis    Left ovarian cyst    Mild intermittent asthma    followed by pcp   (05-31-2020  per pt last exacerbation 11-09-2019 ED visit)   Obesity (BMI 30-39.9)    Pelvic adhesive disease    Pneumonia    Review of Systems:  As per HPI  Physical Exam:  Vitals:   11/19/22 0855  BP: 124/75  Pulse: 100  Temp: 98.6 F (37 C)  TempSrc: Oral  SpO2: 100%  Weight: 178 lb 14.4 oz (81.1 kg)  Height: '5\' 2"'$  (1.575 m)   General: Resting comfortably in no acute distress CV: Regular rate, rhythm. No murmurs appreciated. Pulm: Normal work of breathing on room air. Clear to auscultation bilaterally.  MSK: Normal bulk, tone. No peripheral edema noted. Bilateral knees without effusions or crepitus. Normal anterior/posterior drawers and varus/valgus testing bilaterally. Normal active and passive range of knees bilaterally.  Skin: Warm, dry. No rashes or lesions. Neuro: Awake, alert, conversing appropriately.  Psych: Normal mood, affect, speech.   Assessment & Plan:   Severe persistent asthma without complication Patient reports she is doing well with current regimen of daily Advair and as needed albuterol. Since being on this regimen she has not needed to use albuterol. Denies any coughing, severe dyspnea. 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  - Continue Advair twice daily - Albuterol as needed  Alcohol abuse Patient has reduced alcohol  intake to ~3 shots of liquor daily, down from 5 previously. She denies any abdominal pain, nausea, vomiting, or withdrawal symptoms. Would continue to monitor this for signs of increase use.   Tobacco abuse Patient reports continued use of cigarettes, typically around half a pack daily. She would like to eventually stop, but stressors make this difficult. Specifically mentions her mother, which causes her anxiety. She would not like any nicotine replacement therapy or other medication for cessation. Would continue to monitor this moving forward.   Bilateral knee pain Patient reports she has had increase in bilateral knee pain over the last few months. Describes achy, burning feelings in her legs by the end of the day. She works at [employer] as often has to bend down and stand back up throughout the day. She has not taken anything for the pain. Denies any fevers, chills, trauma, skin changes. On exam today, there are no effusions and the knees appear to be functioning well with normal active and passive range of motion. Most likely her pain is caused by overuse with her work. We will prescribe topical anti-inflammatory for this.  - Voltaren gel as needed  Patient discussed with Dr. Denita Lung, MD Internal Medicine PGY-3 Pager: [phone]

## 2022-11-25 NOTE — Progress Notes (Signed)
Internal Medicine Clinic Attending  Case discussed with the resident at the time of the visit.  We reviewed the resident's history and exam and pertinent patient test results.  I agree with the assessment, diagnosis, and plan of care documented in the resident's note.  

## 2022-11-28 ENCOUNTER — Other Ambulatory Visit (HOSPITAL_COMMUNITY): Payer: Self-pay

## 2022-12-30 ENCOUNTER — Other Ambulatory Visit (HOSPITAL_COMMUNITY): Payer: Self-pay

## 2022-12-30 ENCOUNTER — Other Ambulatory Visit: Payer: Self-pay

## 2023-02-17 ENCOUNTER — Other Ambulatory Visit: Payer: Self-pay

## 2023-02-17 ENCOUNTER — Other Ambulatory Visit: Payer: Self-pay | Admitting: Student

## 2023-02-17 ENCOUNTER — Other Ambulatory Visit (HOSPITAL_COMMUNITY): Payer: Self-pay

## 2023-02-17 ENCOUNTER — Encounter: Payer: Self-pay | Admitting: *Deleted

## 2023-02-17 MED ORDER — ALBUTEROL SULFATE HFA 108 (90 BASE) MCG/ACT IN AERS
2.0000 | INHALATION_SPRAY | Freq: Four times a day (QID) | RESPIRATORY_TRACT | 2 refills | Status: DC | PRN
  Filled 2023-02-17 – 2023-03-29 (×2): qty 6.7, 25d supply, fill #0

## 2023-02-18 ENCOUNTER — Other Ambulatory Visit (HOSPITAL_COMMUNITY): Payer: Self-pay

## 2023-03-30 ENCOUNTER — Other Ambulatory Visit (HOSPITAL_COMMUNITY): Payer: Self-pay

## 2023-05-13 ENCOUNTER — Other Ambulatory Visit: Payer: Self-pay

## 2023-05-13 ENCOUNTER — Other Ambulatory Visit (HOSPITAL_COMMUNITY): Payer: Self-pay

## 2023-05-14 ENCOUNTER — Other Ambulatory Visit (HOSPITAL_COMMUNITY): Payer: Self-pay

## 2023-05-25 ENCOUNTER — Other Ambulatory Visit (HOSPITAL_COMMUNITY): Payer: Self-pay

## 2023-06-04 ENCOUNTER — Other Ambulatory Visit (HOSPITAL_COMMUNITY): Payer: Self-pay

## 2023-06-04 ENCOUNTER — Other Ambulatory Visit: Payer: Self-pay | Admitting: Student

## 2023-06-04 MED ORDER — MONTELUKAST SODIUM 10 MG PO TABS
10.0000 mg | ORAL_TABLET | Freq: Every day | ORAL | 2 refills | Status: AC
  Filled 2023-06-04: qty 30, 30d supply, fill #0

## 2023-06-04 NOTE — Telephone Encounter (Signed)
Pt was called to schedule 6 month f/u appt - no answer; left message to call our office.

## 2023-06-05 ENCOUNTER — Other Ambulatory Visit: Payer: Self-pay

## 2023-06-05 ENCOUNTER — Other Ambulatory Visit (HOSPITAL_COMMUNITY): Payer: Self-pay

## 2023-06-05 ENCOUNTER — Telehealth: Payer: Self-pay

## 2023-06-05 NOTE — Telephone Encounter (Signed)
Rec'd message from [facility].   Advair in no longer on IM Program and doesn't have a patient assistance program available. Pharmacy is able to fill one last time while we work on changing medication.   Kara Haynes has a patient assistance program available ([place]) if this is an appropriate change. If so, I can mail application to patients home. If approved, medication will ship to patient at no charge.

## 2023-06-09 ENCOUNTER — Other Ambulatory Visit (HOSPITAL_COMMUNITY): Payer: Self-pay

## 2023-06-09 NOTE — Telephone Encounter (Signed)
Pt called regarding inhaler.   Believes the pharmacy is able to fill the Advair one last time on program. She is aware of medication leaving program and waiting for pcp to ok change to [facility].

## 2023-06-19 NOTE — Telephone Encounter (Signed)
Application mailed 06/17/23.

## 2023-07-07 ENCOUNTER — Ambulatory Visit (INDEPENDENT_AMBULATORY_CARE_PROVIDER_SITE_OTHER): Payer: Self-pay | Admitting: Student

## 2023-07-07 ENCOUNTER — Encounter: Payer: Self-pay | Admitting: Student

## 2023-07-07 VITALS — BP 123/77 | HR 74 | Ht 62.0 in | Wt 162.9 lb

## 2023-07-07 DIAGNOSIS — F1010 Alcohol abuse, uncomplicated: Secondary | ICD-10-CM

## 2023-07-07 DIAGNOSIS — Z720 Tobacco use: Secondary | ICD-10-CM

## 2023-07-07 DIAGNOSIS — M25562 Pain in left knee: Secondary | ICD-10-CM

## 2023-07-07 DIAGNOSIS — G8929 Other chronic pain: Secondary | ICD-10-CM

## 2023-07-07 DIAGNOSIS — F4321 Adjustment disorder with depressed mood: Secondary | ICD-10-CM

## 2023-07-07 DIAGNOSIS — J4550 Severe persistent asthma, uncomplicated: Secondary | ICD-10-CM

## 2023-07-07 DIAGNOSIS — F17210 Nicotine dependence, cigarettes, uncomplicated: Secondary | ICD-10-CM

## 2023-07-07 DIAGNOSIS — F419 Anxiety disorder, unspecified: Secondary | ICD-10-CM

## 2023-07-07 DIAGNOSIS — M25561 Pain in right knee: Secondary | ICD-10-CM

## 2023-07-07 NOTE — Patient Instructions (Addendum)
Thank you, [name] for allowing Kara Haynes to provide your care today. Today we discussed alcohol use, tobacco use, knee pain, and asthma.    I have ordered the following labs for you:  Lab Orders         Basic metabolic panel       Tests ordered today:    Referrals ordered today:   Referral Orders         Ambulatory referral to Integrated Behavioral Health       I have ordered the following medication/changed the following medications:   Stop the following medications: There are no discontinued medications.   Start the following medications: No orders of the defined types were placed in this encounter.    Follow up: 4-6 weeks    Remember:   - Please call Kara Haynes if you have any increased knee pain, inflammation, or you feel like you can't walk on it so we can bring you in for further assistance   - I will call you to go over your lab results and can provide options for medications if kidneys look ok   - I'm going to contact [place] our pharmacy tech to make sure everything is ok with your inhalers  - [name] will give you a call regarding resources [facility]) for coverage and for behavioral support   Should you have any questions or concerns please call the internal medicine clinic at [phone].     [name] [name], MD PGY-1 Internal Medicine Teaching [facility]

## 2023-07-08 DIAGNOSIS — F4321 Adjustment disorder with depressed mood: Secondary | ICD-10-CM | POA: Insufficient documentation

## 2023-07-08 LAB — BASIC METABOLIC PANEL
BUN/Creatinine Ratio: 9 (ref 9–23)
BUN: 7 mg/dL (ref 6–20)
CO2: 21 mmol/L (ref 20–29)
Calcium: 9.3 mg/dL (ref 8.7–10.2)
Chloride: 103 mmol/L (ref 96–106)
Creatinine, Ser: 0.77 mg/dL (ref 0.57–1.00)
Glucose: 70 mg/dL (ref 70–99)
Potassium: 4.2 mmol/L (ref 3.5–5.2)
Sodium: 142 mmol/L (ref 134–144)
eGFR: 104 mL/min/{1.73_m2} (ref >59)

## 2023-07-08 NOTE — Progress Notes (Signed)
Established Patient Office Visit  Subjective   Patient ID: Kara Haynes, female    DOB: 24-Jun-1991  Age: 32 y.o. MRN: [id number]  Chief Complaint  Patient presents with   Follow-up   Patient is a 32 yo with a past medical history stated below who presents today for follow-up for asthma, mood, substance use, and knee pain. Please see problem based assessment and plan for additional details.     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 ;  completed xarelto treated 05/ 2015   History of trichomoniasis    Left ovarian cyst    Mild intermittent asthma    followed by pcp   (05-31-2020  per pt last exacerbation 11-09-2019 ED visit)   Obesity (BMI 30-39.9)    Pelvic adhesive disease    Pneumonia       Review of Systems  Respiratory:  Positive for wheezing.   Musculoskeletal:  Positive for joint pain.  Psychiatric/Behavioral:  Positive for depression. The patient is nervous/anxious.      Objective:     BP 123/77 (BP Location: Right Arm, Patient Position: Sitting, Cuff Size: Small)   Pulse 74   Ht 5\' 2"  (1.575 m)   Wt 162 lb 14.4 oz (73.9 kg)   LMP 06/16/2023 (Approximate)   SpO2 100%   BMI 29.79 kg/m  BP Readings from Last 3 Encounters:  07/07/23 123/77  11/19/22 124/75  06/02/22 131/83   Wt Readings from Last 3 Encounters:  07/07/23 162 lb 14.4 oz (73.9 kg)  11/19/22 178 lb 14.4 oz (81.1 kg)  06/02/22 197 lb 4.8 oz (89.5 kg)    Physical Exam HENT:     Head: Normocephalic and atraumatic.     Nose: Nose normal.     Mouth/Throat:     Mouth: Mucous membranes are moist.  Eyes:     Pupils: Pupils are equal, round, and reactive to light.  Cardiovascular:     Rate and Rhythm: Normal rate.     Pulses: Normal pulses.     Heart sounds: Normal heart  sounds.  Pulmonary:     Effort: Pulmonary effort is normal.     Breath sounds: Wheezing present.  Abdominal:     General: Abdomen is flat. Bowel sounds are normal.     Palpations: Abdomen is soft.  Musculoskeletal:     Comments: Right knee appears to have normal appearance compared to left, no signs of trauma, inflammation, or infection. ROM normal, slightly tender along patella on palpation.   Skin:    General: Skin is warm and dry.  Neurological:     General: No focal deficit present.     Mental Status: She is alert.  Psychiatric:        Mood and Affect: Mood normal.        Behavior: Behavior normal.    Results for orders placed or performed in visit on 07/07/23  Basic metabolic panel  Result Value Ref Range   Glucose 70 70 - 99 mg/dL   BUN 7 6 - 20 mg/dL   Creatinine, Ser 7.25 0.57 - 1.00 mg/dL   eGFR 366 >44 IH/KVQ/2.59   BUN/Creatinine Ratio 9 9 - 23   Sodium 142 134 - 144 mmol/L   Potassium 4.2  3.5 - 5.2 mmol/L   Chloride 103 96 - 106 mmol/L   CO2 21 20 - 29 mmol/L   Calcium 9.3 8.7 - 10.2 mg/dL   Last metabolic panel Lab Results  Component Value Date   GLUCOSE 70 07/07/2023   NA 142 07/07/2023   K 4.2 07/07/2023   CL 103 07/07/2023   CO2 21 07/07/2023   BUN 7 07/07/2023   CREATININE 0.77 07/07/2023   EGFR 104 07/07/2023   CALCIUM 9.3 07/07/2023   PHOS 4.4 11/18/2021   PROT 6.3 (L) 11/12/2021   ALBUMIN 3.2 (L) 11/12/2021   LABGLOB 2.6 03/15/2020   AGRATIO 1.6 03/15/2020   BILITOT 0.6 11/12/2021   ALKPHOS 51 11/12/2021   AST 53 (H) 11/12/2021   ALT 27 11/12/2021   ANIONGAP 8 11/19/2021    The ASCVD Risk score (Arnett DK, et al., 2019) failed to calculate for the following reasons:   The 2019 ASCVD risk score is only valid for ages 75 to 53    Assessment & Plan:   Problem List Items Addressed This Visit       Respiratory   Severe persistent asthma without complication    Patient with current regimen of daily Advair and albuterol PRN. Is mostly  using Advair BID, is not taking any allergy medication at this time. States she is getting help from [place] to continue getting inhalers as she is currently uninsured and has been denied by Medicaid. Has had intermittently increased cough with the recent change in weather. Denies severe SOB but does endorse some wheezing. Discussed cigarette smoking as a possible trigger. Patient with some wheezing in upper lobes on exam.  - Will message Estanislado Spire for follow up on assistance with a replacement inhaler for Advair that are also ICS + LABA such as Earlie Server, Dulera, or Symbicort  - Will put in referral for IBH with [name] for SW help with [employer] application         Other   Tobacco abuse    Patient still smoking about half a pack daily, maybe more due to stress surrounding recent family death. Is not interested in nicotine replacement therapy at this time. Is open to discussing smoking cessation at next visit.       Alcohol abuse    Reports she is still taking shots of liquor daily, may be more than 3 daily with recent passing of MIL. Denies any withdrawal symptoms or symptoms concerning for dependence. Patient did not want to discuss alcohol cessation at this time, will defer for next visit. Referred to integrated behavioral health for support during this difficult time.        Relevant Orders   Ambulatory referral to Integrated Behavioral Health   Basic metabolic panel (Completed)   Bilateral knee pain    Chronic knee pain, reports lately it has been mostly her right knee. Can walk on it but it can become uncomfortable after a long day at work. Has not tried taking anything for it OTC or using ice/warm compresses. Not in acute pain today, discussed symptoms to look out for that may be more concerning for joint inflammation or infection. MSK exam unremarkable except for slight tenderness on palpation along R patella. Will get BMP and make recommendations for PRN medications if kidney  function looks ok. Can also treat with warm compresses, consider imaging or PT if pain worsens or it greatly impacts her function.  - Follow up on BMP, provide PRN pain medications  Grief    Patient visibly upset discussing recent loss of MIL/partner's mother. She feels like it's been impacting her tobacco use and drinking. Is open to discussing options for grief/counseling support should she need it.  - Referral to integrated behavior today, will send message to [facility]       Other Visit Diagnoses     Anxiety    -  Primary   Relevant Orders   Ambulatory referral to Integrated Behavioral Health       Return in about 4 weeks (around 08/04/2023) for 4-6 week follow up for mood, asthma, substance use .   Patient seen with Dr. Lafonda Mosses.  Jeanice Dempsey [name], MD

## 2023-07-08 NOTE — Assessment & Plan Note (Addendum)
Patient visibly upset discussing recent loss of MIL/partner's mother. She feels like it's been impacting her tobacco use and drinking. Is open to discussing options for grief/counseling support should she need it.  - Referral to integrated behavior today, will send message to [facility]

## 2023-07-08 NOTE — Assessment & Plan Note (Signed)
Reports she is still taking shots of liquor daily, may be more than 3 daily with recent passing of MIL. Denies any withdrawal symptoms or symptoms concerning for dependence. Patient did not want to discuss alcohol cessation at this time, will defer for next visit. Referred to integrated behavioral health for support during this difficult time.

## 2023-07-08 NOTE — Assessment & Plan Note (Signed)
Patient with current regimen of daily Advair and albuterol PRN. Is mostly using Advair BID, is not taking any allergy medication at this time. States she is getting help from [facility] to continue getting inhalers as she is currently uninsured and has been denied by Medicaid. Has had intermittently increased cough with the recent change in weather. Denies severe SOB but does endorse some wheezing. Discussed cigarette smoking as a possible trigger. Patient with some wheezing in upper lobes on exam.  - Will message Estanislado Spire for follow up on assistance with a replacement inhaler for Advair that are also ICS + LABA such as Earlie Server, Dulera, or Symbicort  - Will put in referral for IBH with [name] for SW help with [employer]

## 2023-07-08 NOTE — Assessment & Plan Note (Signed)
Chronic knee pain, reports lately it has been mostly her right knee. Can walk on it but it can become uncomfortable after a long day at work. Has not tried taking anything for it OTC or using ice/warm compresses. Not in acute pain today, discussed symptoms to look out for that may be more concerning for joint inflammation or infection. MSK exam unremarkable except for slight tenderness on palpation along R patella. Will get BMP and make recommendations for PRN medications if kidney function looks ok. Can also treat with warm compresses, consider imaging or PT if pain worsens or it greatly impacts her function.  - Follow up on BMP, provide PRN pain medications

## 2023-07-08 NOTE — Assessment & Plan Note (Signed)
Patient still smoking about half a pack daily, maybe more due to stress surrounding recent family death. Is not interested in nicotine replacement therapy at this time. Is open to discussing smoking cessation at next visit.

## 2023-07-09 NOTE — Progress Notes (Signed)
Internal Medicine Clinic Attending  I was physically present during the key portions of the resident provided service and participated in the medical decision making of patient's management care. I reviewed pertinent patient test results.  The assessment, diagnosis, and plan were formulated together and I agree with the documentation in the resident's note.  Mercie Eon, MD

## 2023-07-10 NOTE — Telephone Encounter (Signed)
Pt portion rec'd.   Requested hard copy RX (from pcp/red team) to submit with application.

## 2023-07-13 ENCOUNTER — Other Ambulatory Visit: Payer: Self-pay | Admitting: Student

## 2023-07-13 MED ORDER — FLUTICASONE-SALMETEROL 250-50 MCG/ACT IN AEPB: 1.0000 | INHALATION_SPRAY | Freq: Two times a day (BID) | RESPIRATORY_TRACT | 2 refills | Status: DC

## 2023-07-13 MED ORDER — ALBUTEROL SULFATE HFA 108 (90 BASE) MCG/ACT IN AERS
2.0000 | INHALATION_SPRAY | Freq: Four times a day (QID) | RESPIRATORY_TRACT | 3 refills | Status: DC | PRN
Start: 2023-07-13 — End: 2023-08-25

## 2023-07-14 ENCOUNTER — Other Ambulatory Visit: Payer: Self-pay | Admitting: Student

## 2023-07-14 MED ORDER — FLUTICASONE FUROATE-VILANTEROL 100-25 MCG/ACT IN AEPB: 1.0000 | INHALATION_SPRAY | Freq: Every day | RESPIRATORY_TRACT | 2 refills | Status: AC

## 2023-07-14 NOTE — Telephone Encounter (Signed)
Hardcopy RX will need to be for BREO. Patient assistance is with company [employer].   Could another signed hardcopy RX be placed in my box (or faxed to me directly at [phone]? Written for a 3 month supply with refills.

## 2023-07-16 ENCOUNTER — Ambulatory Visit (INDEPENDENT_AMBULATORY_CARE_PROVIDER_SITE_OTHER): Payer: Self-pay | Admitting: Licensed Clinical Social Worker

## 2023-07-16 DIAGNOSIS — F1010 Alcohol abuse, uncomplicated: Secondary | ICD-10-CM

## 2023-07-16 NOTE — BH Specialist Note (Signed)
Attempted patient at telephone number [phone] . Phone continuously rung and a VM was not an option. Patient no-showed today's appointment; appointment was for Telephone visit at 1:30am   [facility] contacted patient from telephone number [phone].  Patient will need to reschedule appointment by calling Internal medicine center [phone].  Christen Butter, MSW, LCSW-A She/Her Behavioral Health Clinician [facility]  Internal Medicine Center Direct Dial:[phone]  Fax [phone] Main Office Phone: [phone] [address] [place] [place], [place] [address] Website: [facility] Internal Medicine [facility]  [place], [place]  Ord

## 2023-07-16 NOTE — Telephone Encounter (Signed)
Submitted application for BREO ELLIPTA 100MCG/25MCG to [place] for patient assistance.   Phone: [phone]

## 2023-07-17 NOTE — Progress Notes (Signed)
Spoke to patient on the phone, shared BMP normal, nothing to do. Per patient easier to talk on the phone Tuesdays and Wednesdays, states she may have missed call from [place] [facility] earlier this week. Discussed pending inhaler scripts which were submitted earlier this week by Estanislado Spire. Patient to call back clinic if she still hasn't received inhaler refills by next week.

## 2023-07-21 ENCOUNTER — Other Ambulatory Visit: Payer: Self-pay | Admitting: Student

## 2023-07-21 ENCOUNTER — Other Ambulatory Visit (HOSPITAL_COMMUNITY): Payer: Self-pay

## 2023-08-03 NOTE — Progress Notes (Signed)
Pharmacy Medication Assistance Program Note    08/03/2023  Patient ID: [name], female   DOB: Sep 24, 1990, 32 y.o.   MRN: [id number]     07/16/2023 08/03/2023  Outreach Medication One  Manufacturer Medication One [employer]Smith[employer]Kline (GSK) Glaxo/Smith/Kline (GSK)  GlaxoSmithKline (GSK) Drugs Breo Breo  Type of Radiographer, therapeutic Assistance   Date Application Sent to Patient 06/17/2023   Application Items Requested Application   Name of Prescriber Philomena Doheny   Date Application Received From Patient 07/10/2023   Application Items Received From Patient Application   Date Application Submitted to Manufacturer 07/16/2023   Method Application Sent to Manufacturer Fax   Patient Assistance Determination  Approved  Approval Start Date  07/22/2023  Approval End Date  07/20/2024

## 2023-08-04 ENCOUNTER — Encounter: Payer: Self-pay | Admitting: Student

## 2023-08-25 ENCOUNTER — Encounter: Payer: Self-pay | Admitting: Student

## 2023-08-25 ENCOUNTER — Ambulatory Visit (INDEPENDENT_AMBULATORY_CARE_PROVIDER_SITE_OTHER): Payer: Self-pay | Admitting: Student

## 2023-08-25 ENCOUNTER — Other Ambulatory Visit (HOSPITAL_COMMUNITY): Payer: Self-pay

## 2023-08-25 VITALS — BP 113/73 | HR 102 | Temp 98.3°F | Ht 62.0 in | Wt 162.1 lb

## 2023-08-25 DIAGNOSIS — F4321 Adjustment disorder with depressed mood: Secondary | ICD-10-CM

## 2023-08-25 DIAGNOSIS — J4550 Severe persistent asthma, uncomplicated: Secondary | ICD-10-CM

## 2023-08-25 DIAGNOSIS — M25561 Pain in right knee: Secondary | ICD-10-CM

## 2023-08-25 DIAGNOSIS — F1010 Alcohol abuse, uncomplicated: Secondary | ICD-10-CM

## 2023-08-25 MED ORDER — DICLOFENAC SODIUM 1 % EX GEL
2.0000 g | Freq: Four times a day (QID) | CUTANEOUS | 0 refills | Status: AC
  Filled 2023-08-25: qty 100, 7d supply, fill #0
  Filled 2023-08-25: qty 100, 13d supply, fill #0
  Filled 2023-11-10: qty 100, 12d supply, fill #0
  Filled 2023-11-24: qty 100, 30d supply, fill #0

## 2023-08-25 MED ORDER — ALBUTEROL SULFATE HFA 108 (90 BASE) MCG/ACT IN AERS
2.0000 | INHALATION_SPRAY | Freq: Four times a day (QID) | RESPIRATORY_TRACT | 3 refills | Status: DC | PRN
  Filled 2023-08-25 – 2023-11-24 (×3): qty 6.7, 25d supply, fill #0
  Filled 2024-01-14 – 2024-07-04 (×3): qty 6.7, 25d supply, fill #1

## 2023-08-25 NOTE — Assessment & Plan Note (Signed)
Patient continues to grieve over death of mother-in-law. She feels that she uses alcohol to cope with this grief. Previously referred to [facility], unable to make the visit due to work. She states that Tuesdays are her best day for an appointment and is open to grief counseling. I have placed another referral.

## 2023-08-25 NOTE — Assessment & Plan Note (Signed)
She continues to use "about the same" amount of alcohol (3+ shots of liquor daily per prior note). She has tried to cut back in the past but was unsuccessful. She seems to be in the pre-contemplation stage, not ready to quit at this time. I spoke with her about Naltrexone as an option in the future, but she would prefer to avoid medications. We will continue to encourage alcohol cessation.

## 2023-08-25 NOTE — Patient Instructions (Signed)
Thank you, [name] for allowing [place] to provide your care today. Today we discussed your asthma.  I have sent in refills to your pharmacy for albuterol and voltaren gel for your knees.   I have also sent in a referral to [name], our behavioral health specialist, who will be contacting you shortly to schedule an appointment.     Referrals ordered today:    Referral Orders         Ambulatory referral to Integrated Behavioral Health      I have ordered the following medication/changed the following medications:   Meds ordered this encounter  Medications   albuterol (VENTOLIN HFA) 108 (90 Base) MCG/ACT inhaler    Sig: Inhale 2 puffs into the lungs every 6 (six) hours as needed for wheezing or shortness of breath.    Dispense:  8 g    Refill:  3    IM Program   diclofenac Sodium (VOLTAREN) 1 % GEL    Sig: Apply 2 grams topically 4 (four) times daily.    Dispense:  100 g    Refill:  0     Follow up: 3 months   We look forward to seeing you next time. Please call our clinic at [phone] if you have any questions or concerns. The best time to call is Monday-Friday from 9am-4pm, but there is someone available 24/7. If after hours or the weekend, call the main hospital number and ask for the Internal Medicine Resident On-Call. If you need medication refills, please notify your pharmacy one week in advance and they will send [place] a request.   Thank you for trusting me with your care. Wishing you the best!   Annett Fabian, MD [facility]

## 2023-08-25 NOTE — Progress Notes (Signed)
CC: medication refill  HPI:  Kara Haynes is a 32 y.o. female living with a history stated below and presents today for medication refill. Please see problem based assessment and plan for additional details.  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 ;  completed xarelto treated 05/ 2015   History of trichomoniasis    Left ovarian cyst    Mild intermittent asthma    followed by pcp   (05-31-2020  per pt last exacerbation 11-09-2019 ED visit)   Obesity (BMI 30-39.9)    Pelvic adhesive disease    Pneumonia     Current Outpatient Medications on File Prior to Visit  Medication Sig Dispense Refill   cyanocobalamin 1000 MCG tablet Take 1 tablet (1,000 mcg total) by mouth daily. 30 tablet 2   fluticasone furoate-vilanterol (BREO ELLIPTA) 100-25 MCG/ACT AEPB Inhale 1 puff into the lungs daily. 90 each 2   loratadine (CLARITIN) 10 MG tablet Take 1 tablet (10 mg total) by mouth daily. 30 tablet 2   montelukast (SINGULAIR) 10 MG tablet Take 1 tablet (10 mg total) by mouth at bedtime. 30 tablet 2   No current facility-administered medications on file prior to visit.    Family History  Problem Relation Age of Onset   Other Other        denies family h/o clotting d/o   Asthma Maternal Grandfather    Hypertension Maternal Grandfather    Healthy Mother    Healthy Father     Social History   Socioeconomic History   Marital status: Single    Spouse name: Not on file   Number of children: Not on file   Years of education: Not on file   Highest education level: Not on file  Occupational History   Not on file  Tobacco Use   Smoking status: Some Days    Current packs/day: 0.50    Average packs/day: 0.5 packs/day for 12.0 years (6.0 ttl pk-yrs)    Types: Cigarettes    Smokeless tobacco: Never   Tobacco comments:    12/11/21 states she has stopped smoking since going into the hospital  Vaping Use   Vaping status: Never Used  Substance and Sexual Activity   Alcohol use: Yes    Alcohol/week: 0.0 standard drinks of alcohol   Drug use: Yes    Types: Marijuana   Sexual activity: Yes    Birth control/protection: None  Other Topics Concern   Not on file  Social History Narrative   Not on file   Social Determinants of Health   Financial Resource Strain: Medium Risk (08/25/2023)   Overall Financial Resource Strain (CARDIA)    Difficulty of Paying Living Expenses: Somewhat hard  Food Insecurity: No Food Insecurity (08/20/2020)   Hunger Vital Sign    Worried About Running Out of Food in the Last Year: Never true    Ran Out of Food in the Last Year: Never true  Transportation Needs: No Transportation Needs (08/20/2020)   PRAPARE - Administrator, Civil Service (Medical): No    Lack of Transportation (Non-Medical): No  Physical Activity: Inactive (08/25/2023)   Exercise Vital Sign    Days of Exercise per Week: 0 days    Minutes of Exercise  per Session: 0 min  Stress: Stress Concern Present (08/25/2023)   [employer] of Occupational Health - Occupational Stress Questionnaire    Feeling of Stress : Rather much  Social Connections: Socially Isolated (08/25/2023)   Social Connection and Isolation Panel [NHANES]    Frequency of Communication with Friends and Family: More than three times a week    Frequency of Social Gatherings with Friends and Family: More than three times a week    Attends Religious Services: Never    Database administrator or Organizations: No    Attends Banker Meetings: Never    Marital Status: Never married  Intimate Partner Violence: Not At Risk (08/25/2023)   Humiliation, Afraid, Rape, and Kick questionnaire    Fear of Current or Ex-Partner: No    Emotionally Abused: No    Physically Abused: No     Sexually Abused: No    Review of Systems: ROS negative except for what is noted on the assessment and plan.  Vitals:   08/25/23 0951  BP: 113/73  Pulse: (!) 102  Temp: 98.3 F (36.8 C)  TempSrc: Oral  SpO2: 98%  Weight: 162 lb 1.6 oz (73.5 kg)  Height: 5\' 2"  (1.575 m)   Physical Exam: Constitutional: well appearing female, in no acute distress Cardiovascular: regular rate and rhythm, no m/r/g Pulmonary/Chest: CTA bilaterally, normal respiratory effort; no wheezes Neurological: alert & oriented x 3, no focal deficits  Assessment & Plan:   Patient discussed with Dr. Antony Contras  Severe persistent asthma without complication History of severe asthma, well controlled on Breo ellipta and albuterol PRN. Says she had a few episodes of wheezing right after starting Breo ellipta, but since then feels like her asthma is well controlled on this inhaler. She does report some chest tightness in the morning, which improves with inhaler use. She has not had to use her albuterol recently. Her lungs are clear to auscultation bilaterally, no wheezes currently. Normal RR. She does note some sinus congestion for the past week, which I suspect will self-resolve. If it does not resolve in a week or two, I recommended she call our clinic and we can order some flonase for her.   Plan - continue Breo ellipta daily, albuterol PRN refilled - continue claritin for allergies  Grief Patient continues to grieve over death of mother-in-law. She feels that she uses alcohol to cope with this grief. Previously referred to [facility], unable to make the visit due to work. She states that Tuesdays are her best day for an appointment and is open to grief counseling. I have placed another referral.   Alcohol abuse She continues to use "about the same" amount of alcohol (3+ shots of liquor daily per prior note). She has tried to cut back in the past but was unsuccessful. She seems to be in the pre-contemplation stage,  not ready to quit at this time. I spoke with her about Naltrexone as an option in the future, but she would prefer to avoid medications. We will continue to encourage alcohol cessation.   Annett Fabian, MD  [facility] Internal Medicine, PGY-1 Phone: [phone] Date 08/25/2023 Time 10:39 AM

## 2023-08-25 NOTE — Assessment & Plan Note (Signed)
History of severe asthma, well controlled on Breo ellipta and albuterol PRN. Says she had a few episodes of wheezing right after starting Breo ellipta, but since then feels like her asthma is well controlled on this inhaler. She does report some chest tightness in the morning, which improves with inhaler use. She has not had to use her albuterol recently. Her lungs are clear to auscultation bilaterally, no wheezes currently. Normal RR. She does note some sinus congestion for the past week, which I suspect will self-resolve. If it does not resolve in a week or two, I recommended she call our clinic and we can order some flonase for her.   Plan - continue Breo ellipta daily, albuterol PRN refilled - continue claritin for allergies

## 2023-08-28 NOTE — Progress Notes (Signed)
Internal Medicine Clinic Attending  Case discussed with the resident at the time of the visit.  We reviewed the resident's history and exam and pertinent patient test results.  I agree with the assessment, diagnosis, and plan of care documented in the resident's note.  

## 2023-09-03 ENCOUNTER — Other Ambulatory Visit (HOSPITAL_COMMUNITY): Payer: Self-pay

## 2023-09-28 ENCOUNTER — Encounter: Payer: 59 | Admitting: Licensed Clinical Social Worker

## 2023-11-10 ENCOUNTER — Other Ambulatory Visit: Payer: Self-pay

## 2023-11-10 ENCOUNTER — Other Ambulatory Visit (HOSPITAL_COMMUNITY): Payer: Self-pay

## 2023-11-20 ENCOUNTER — Other Ambulatory Visit (HOSPITAL_COMMUNITY): Payer: Self-pay

## 2023-11-24 ENCOUNTER — Other Ambulatory Visit (HOSPITAL_COMMUNITY): Payer: Self-pay

## 2024-01-26 ENCOUNTER — Other Ambulatory Visit (HOSPITAL_COMMUNITY): Payer: Self-pay

## 2024-04-12 ENCOUNTER — Other Ambulatory Visit: Payer: Self-pay

## 2024-04-12 ENCOUNTER — Ambulatory Visit (INDEPENDENT_AMBULATORY_CARE_PROVIDER_SITE_OTHER): Payer: Self-pay

## 2024-04-12 ENCOUNTER — Other Ambulatory Visit (HOSPITAL_COMMUNITY): Payer: Self-pay

## 2024-04-12 VITALS — BP 131/90 | HR 75 | Temp 98.4°F | Ht 62.0 in | Wt 150.4 lb

## 2024-04-12 DIAGNOSIS — Z720 Tobacco use: Secondary | ICD-10-CM

## 2024-04-12 DIAGNOSIS — F1010 Alcohol abuse, uncomplicated: Secondary | ICD-10-CM | POA: Diagnosis not present

## 2024-04-12 DIAGNOSIS — F17210 Nicotine dependence, cigarettes, uncomplicated: Secondary | ICD-10-CM | POA: Diagnosis not present

## 2024-04-12 DIAGNOSIS — R634 Abnormal weight loss: Secondary | ICD-10-CM

## 2024-04-12 DIAGNOSIS — F4321 Adjustment disorder with depressed mood: Secondary | ICD-10-CM

## 2024-04-12 DIAGNOSIS — J4550 Severe persistent asthma, uncomplicated: Secondary | ICD-10-CM | POA: Diagnosis not present

## 2024-04-12 MED ORDER — FLUTICASONE FUROATE-VILANTEROL 100-25 MCG/ACT IN AEPB: 1.0000 | INHALATION_SPRAY | RESPIRATORY_TRACT | Status: AC | PRN

## 2024-04-12 MED ORDER — THIAMINE HCL 100 MG PO TABS
100.0000 mg | ORAL_TABLET | Freq: Every day | ORAL | 2 refills | Status: AC
  Filled 2024-04-12: qty 30, 30d supply, fill #0

## 2024-04-12 MED ORDER — FOLIC ACID 1 MG PO TABS
1.0000 mg | ORAL_TABLET | Freq: Every day | ORAL | 2 refills | Status: AC
  Filled 2024-04-12 (×2): qty 30, 30d supply, fill #0
  Filled 2024-07-04: qty 90, 90d supply, fill #0

## 2024-04-12 NOTE — Assessment & Plan Note (Signed)
 Pt has had very few asthma episodes in the past 6 months. She has not taken her BRIO ELLIPTA due to it not being available in her pharmacy. Brio ellipta will be essential for her symptom control. Will start her back on it. --Start brio ellipta as prescribed PRN during asthma attack --Continue taking albuterol  during asthma episodes

## 2024-04-12 NOTE — Patient Instructions (Signed)
 He was seen today for concerns of weight loss.  Please call the instructions as discussed during our visit: --Take 1 puff Breo Ellipta  as needed during asthma episodes. Orders for upper quadrant ultrasound placed --Visit GI doctor --Alcohol cessation is very important and could be contributing significantly to your symptoms.  Collect labs today to check for liver function.  We will contact you regarding labs soon.  If you do not hear back from us  please give us  a call.

## 2024-04-12 NOTE — Assessment & Plan Note (Signed)
 Pt still grieving loss of mother in law. Offered help with behavioral health to which pt declined.

## 2024-04-12 NOTE — Assessment & Plan Note (Signed)
 Pt continues to smoke 1ppd. Declined when offered help to quit smoking

## 2024-04-12 NOTE — Progress Notes (Unsigned)
 Established Patient Office Visit  Subjective   Patient ID: Kara Haynes, female    DOB: [birth year]  Age: 33 y.o. MRN: [id number]  HPI Pt has a pmh of alcohol abuse, asthma without complications, chronic tobacco use, PE., and came in for a f/u appointment with concerns for weight loss, especially for the past 3-6 months. Pt said she was around 220 lbs two years ago and is now 150lbs. She denies any changes in her meals but does mention that she feels full faster. She eats a lot of fatty and fried foods. She denies any changes in her BM or blood in her stools. Pt denies any fevers, chills, HA, CP, SOB, dizziness, fatigue, night sweats. Pt does endorse swelling in both legs for the past 2 years. Pt's alcohol use has gone up from 2 to now 5+ shots of liquor per day. She still grieves for the loss of her mother in law but declines any help at this time. She smokes 1ppd and has been smoking since she was 17 yrs.    ROS   All pertinent ROS in HPI Objective:     There were no vitals taken for this visit. {Vitals History (Optional):23777}  Physical Exam Constitutional:      Appearance: Normal appearance.  HENT:     Head: Normocephalic and atraumatic.  Eyes:     Extraocular Movements: Extraocular movements intact.  Cardiovascular:     Rate and Rhythm: Normal rate and regular rhythm.     Pulses: Normal pulses.     Comments: +2 DP and Radial pulses BL Pulmonary:     Effort: Pulmonary effort is normal.     Breath sounds: Normal breath sounds. No wheezing or rales.  Abdominal:     General: There is no distension.     Tenderness: There is no guarding.     Comments: Does endorse mild tenderness in RUQ region  Musculoskeletal:     Comments: Mild swelling present in both LE, however, no pitting edema noted.   Skin:    General: Skin is warm.  Neurological:     General: No focal deficit present.     Mental Status: She is alert.     Comments: Muscle strength and Sensation  intact. No CNII-XII deficits. +2 triceps and patellar reflex BL      No results found for any visits on 04/12/24.  {Labs (Optional):23779}  The ASCVD Risk score (Arnett DK, et al., 2019) failed to calculate for the following reasons:   The 2019 ASCVD risk score is only valid for ages 23 to 39    Assessment & Plan:   . Assessment & Plan Weight loss Pt has had a significant weight loss over the past two years. Her excessive alcohol consumption is likely leading to this. However, we do think she would benefit from going to the GI to rule out major concerns like gastric obstruction due to webs, rings, or cancer in the esophagus--which are low on our differential. Would also like to r/o any biliary or gall bladder issues due to her tenderness in the RUQ. --Ordered CMP,CBC, INR-- to check liver function --US  of RUQ ordered for RUQ tenderness --GI referral placed Severe persistent asthma without complication Pt has had very few asthma episodes in the past 6 months. She has not taken her BRIO ELLIPTA due to it not being available in her pharmacy. Brio ellipta will be essential for her symptom control. Will start her back on it. --Start brio ellipta as prescribed  PRN during asthma attack --Continue taking albuterol  during asthma episodes  Alcohol abuse Pt's current weight loss is likely due to her alcohol consumption in extreme. Informed her about the harmful effects of alcohol and its benefits in quitting it with potential use of naltrexone. Pt denied any help.  --Ordered CMP,CBC, INR-- to check liver function --Pt started on Folvite  1mg  tablet daily and Vitamin B1 100 mg tablet daily for likely deficiency of folate and thiamine  due to alcohol use --US  of RUQ ordered for RUQ tenderness --GI referral Tobacco abuse Pt continues to smoke 1ppd. Declined when offered help to quit smoking Grief Pt still grieving loss of mother in law. Offered help with behavioral health to which pt declined.      Rebecka Pion, DO

## 2024-04-12 NOTE — Assessment & Plan Note (Signed)
 Pt's current weight loss is likely due to her alcohol consumption in extreme. Informed her about the harmful effects of alcohol and its benefits in quitting it with potential use of naltrexone. Pt denied any help.  --Ordered CMP,CBC, INR-- to check liver function --Pt started on Folvite  1mg  tablet daily and Vitamin B1 100 mg tablet daily for likely deficiency of folate and thiamine  due to alcohol use --US  of RUQ ordered for RUQ tenderness --GI referral

## 2024-04-13 ENCOUNTER — Ambulatory Visit: Payer: Self-pay

## 2024-04-13 LAB — CBC
Hematocrit: 39.4 % (ref 34.0–46.6)
Hemoglobin: 13.3 g/dL (ref 11.1–15.9)
MCH: 34.4 pg — ABNORMAL HIGH (ref 26.6–33.0)
MCHC: 33.8 g/dL (ref 31.5–35.7)
MCV: 102 fL — ABNORMAL HIGH (ref 79–97)
Platelets: 284 x10E3/uL (ref 150–450)
RBC: 3.87 x10E6/uL (ref 3.77–5.28)
RDW: 13.3 % (ref 11.7–15.4)
WBC: 2.7 x10E3/uL — ABNORMAL LOW (ref 3.4–10.8)

## 2024-04-13 LAB — COMPREHENSIVE METABOLIC PANEL WITH GFR
ALT: 45 IU/L — ABNORMAL HIGH (ref 0–32)
AST: 46 IU/L — ABNORMAL HIGH (ref 0–40)
Albumin: 4.3 g/dL (ref 3.9–4.9)
Alkaline Phosphatase: 90 IU/L (ref 44–121)
BUN/Creatinine Ratio: 8 — ABNORMAL LOW (ref 9–23)
BUN: 6 mg/dL (ref 6–20)
Bilirubin Total: 0.5 mg/dL (ref 0.0–1.2)
CO2: 21 mmol/L (ref 20–29)
Calcium: 9.2 mg/dL (ref 8.7–10.2)
Chloride: 100 mmol/L (ref 96–106)
Creatinine, Ser: 0.72 mg/dL (ref 0.57–1.00)
Globulin, Total: 2.4 g/dL (ref 1.5–4.5)
Glucose: 76 mg/dL (ref 70–99)
Potassium: 4 mmol/L (ref 3.5–5.2)
Sodium: 139 mmol/L (ref 134–144)
Total Protein: 6.7 g/dL (ref 6.0–8.5)
eGFR: 112 mL/min/1.73 (ref >59)

## 2024-04-13 LAB — PROTIME-INR
INR: 1 (ref 0.9–1.2)
Prothrombin Time: 10.4 s (ref 9.1–12.0)

## 2024-04-13 NOTE — Addendum Note (Signed)
 Addended by: Leary Mcnulty on: 04/13/2024 01:42 PM   Modules accepted: Orders

## 2024-04-20 ENCOUNTER — Ambulatory Visit (HOSPITAL_COMMUNITY)
Admission: RE | Admit: 2024-04-20 | Discharge: 2024-04-20 | Disposition: A | Discharge status: Home / Self Care | Payer: Self-pay | Source: Ambulatory Visit | Attending: Internal Medicine | Admitting: Internal Medicine

## 2024-04-20 DIAGNOSIS — R634 Abnormal weight loss: Secondary | ICD-10-CM | POA: Insufficient documentation

## 2024-04-25 ENCOUNTER — Other Ambulatory Visit (HOSPITAL_COMMUNITY): Payer: Self-pay

## 2024-04-29 NOTE — Telephone Encounter (Signed)
 Talked to pt regarding her labs and US . Told her that her pt-inr, hemoglobin, and BUN and cr levels were normal and that kidney function looks good, and she is not anemic. Mentioned to the pt that her ast and alt levels were slightly elevated and that could be due to her alcohol use. Talked about her US  of RUQ and explained that everything looked good overall and there was no specific reason behind her pain, as per US . Her liver did show hepatic steatosis which could likely be due to alcohol use. Told her that her labs/ imaging ordered from our end looked good and they were not able to find a cause behind her weight loss and pain. We have placed a GI referral and have asked her to see GI.

## 2024-05-17 ENCOUNTER — Emergency Department (HOSPITAL_COMMUNITY)
Admission: EM | Admit: 2024-05-17 | Discharge: 2024-05-18 | Discharge status: Against Medical Advice | Source: Ambulatory Visit

## 2024-05-17 ENCOUNTER — Other Ambulatory Visit: Payer: Self-pay

## 2024-05-17 ENCOUNTER — Encounter (HOSPITAL_COMMUNITY): Payer: Self-pay

## 2024-05-17 ENCOUNTER — Ambulatory Visit (HOSPITAL_COMMUNITY)
Admission: EM | Admit: 2024-05-17 | Discharge: 2024-05-17 | Disposition: A | Discharge status: Home / Self Care | Attending: Family Medicine | Admitting: Family Medicine

## 2024-05-17 ENCOUNTER — Emergency Department (HOSPITAL_COMMUNITY)

## 2024-05-17 DIAGNOSIS — R1031 Right lower quadrant pain: Secondary | ICD-10-CM | POA: Insufficient documentation

## 2024-05-17 DIAGNOSIS — Z5321 Procedure and treatment not carried out due to patient leaving prior to being seen by health care provider: Secondary | ICD-10-CM | POA: Insufficient documentation

## 2024-05-17 LAB — COMPREHENSIVE METABOLIC PANEL WITH GFR
ALT: 39 U/L (ref 0–44)
AST: 118 U/L — ABNORMAL HIGH (ref 15–41)
Albumin: 4 g/dL (ref 3.5–5.0)
Alkaline Phosphatase: 89 U/L (ref 38–126)
Anion gap: 12 (ref 5–15)
BUN: 7 mg/dL (ref 6–20)
CO2: 24 mmol/L (ref 22–32)
Calcium: 9.3 mg/dL (ref 8.9–10.3)
Chloride: 100 mmol/L (ref 98–111)
Creatinine, Ser: 0.8 mg/dL (ref 0.44–1.00)
GFR, Estimated: >60 mL/min (ref >60)
Glucose, Bld: 86 mg/dL (ref 70–99)
Potassium: 3.9 mmol/L (ref 3.5–5.1)
Sodium: 136 mmol/L (ref 135–145)
Total Bilirubin: 1.4 mg/dL — ABNORMAL HIGH (ref 0.0–1.2)
Total Protein: 7 g/dL (ref 6.5–8.1)

## 2024-05-17 LAB — POCT URINALYSIS DIP (MANUAL ENTRY)
Bilirubin, UA: NEGATIVE
Blood, UA: NEGATIVE
Glucose, UA: NEGATIVE mg/dL
Leukocytes, UA: NEGATIVE
Nitrite, UA: NEGATIVE
Protein Ur, POC: NEGATIVE mg/dL
Spec Grav, UA: 1.02 (ref 1.010–1.025)
Urobilinogen, UA: 0.2 U/dL
pH, UA: 7 (ref 5.0–8.0)

## 2024-05-17 LAB — CBC WITH DIFFERENTIAL/PLATELET
Abs Immature Granulocytes: 0 K/uL (ref 0.00–0.07)
Basophils Absolute: 0 K/uL (ref 0.0–0.1)
Basophils Relative: 1 %
Eosinophils Absolute: 0 K/uL (ref 0.0–0.5)
Eosinophils Relative: 1 %
HCT: 40.1 % (ref 36.0–46.0)
Hemoglobin: 13.7 g/dL (ref 12.0–15.0)
Immature Granulocytes: 0 %
Lymphocytes Relative: 45 %
Lymphs Abs: 1.8 K/uL (ref 0.7–4.0)
MCH: 34.1 pg — ABNORMAL HIGH (ref 26.0–34.0)
MCHC: 34.2 g/dL (ref 30.0–36.0)
MCV: 99.8 fL (ref 80.0–100.0)
Monocytes Absolute: 0.4 K/uL (ref 0.1–1.0)
Monocytes Relative: 12 %
Neutro Abs: 1.6 K/uL — ABNORMAL LOW (ref 1.7–7.7)
Neutrophils Relative %: 41 %
Platelets: 295 K/uL (ref 150–400)
RBC: 4.02 MIL/uL (ref 3.87–5.11)
RDW: 14.4 % (ref 11.5–15.5)
WBC: 3.8 K/uL — ABNORMAL LOW (ref 4.0–10.5)
nRBC: 0 % (ref 0.0–0.2)

## 2024-05-17 LAB — I-STAT CHEM 8, ED
BUN: 7 mg/dL (ref 6–20)
Calcium, Ion: 1.1 mmol/L — ABNORMAL LOW (ref 1.15–1.40)
Chloride: 101 mmol/L (ref 98–111)
Creatinine, Ser: 0.9 mg/dL (ref 0.44–1.00)
Glucose, Bld: 86 mg/dL (ref 70–99)
HCT: 45 % (ref 36.0–46.0)
Hemoglobin: 15.3 g/dL — ABNORMAL HIGH (ref 12.0–15.0)
Potassium: 3.9 mmol/L (ref 3.5–5.1)
Sodium: 138 mmol/L (ref 135–145)
TCO2: 24 mmol/L (ref 22–32)

## 2024-05-17 LAB — HCG, SERUM, QUALITATIVE: Preg, Serum: NEGATIVE

## 2024-05-17 LAB — LIPASE, BLOOD: Lipase: 34 U/L (ref 11–51)

## 2024-05-17 LAB — POCT URINE PREGNANCY: Preg Test, Ur: NEGATIVE

## 2024-05-17 MED ORDER — IOHEXOL 350 MG/ML SOLN
75.0000 mL | Freq: Once | INTRAVENOUS | Status: AC | PRN
  Administered 2024-05-17: 75 mL via INTRAVENOUS

## 2024-05-17 NOTE — ED Triage Notes (Signed)
 Pt arrive to ED POV referred from UC for further assessment on RLQ abd pain. Pt denies any n/v/d states she is having some difficult to start a stream to urinate, but denies any blood on the urine or vaginal discharge. Pt states pain is 4/10 right now during triage.

## 2024-05-17 NOTE — ED Triage Notes (Addendum)
 Pt c/o RLQ pain x3 hrs. States had the same pain 3wks ago. Denies n/v/d. Denies taken any meds. Pt states has difficulty to start a stream to urinate.

## 2024-05-17 NOTE — ED Notes (Signed)
 Patient is being discharged from the Urgent Care and sent to the Emergency Department via POV . Per Rumaldo Ryder, NP, patient is in need of higher level of care due to abdominal pain. Patient is aware and verbalizes understanding of plan of care.  Vitals:   05/17/24 1940  BP: (!) 123/96  Pulse: 87  Resp: 16  Temp: 98.6 F (37 C)  SpO2: 100%

## 2024-05-17 NOTE — Discharge Instructions (Addendum)
 Please go to the emergency department for further evaluation of your right lower quadrant pain.

## 2024-05-17 NOTE — ED Provider Notes (Signed)
 [facility]    CSN: [id number] Arrival date & time: 05/17/24  1906      History   Chief Complaint Chief Complaint  Patient presents with   Abdominal Pain    HPI Kara Haynes is a 33 y.o. female.   Patient presents with right lower quadrant pain that began about 3 hours ago.  Patient states that she had the same pain about 3 weeks ago that subsided on its own.  Patient states that she has had some difficulty initiating urination over the last couple days.  Denies dysuria, hematuria, urinary frequency/urgency, abnormal vaginal discharge, abnormal vaginal bleeding.  Denies any nausea, vomiting, diarrhea, and fever.  Denies taking any medications for symptoms.  LMP 8/6.  The history is provided by the patient and medical records.  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 ;  completed xarelto  treated 05/ 2015   History of trichomoniasis    Left ovarian cyst    Mild intermittent asthma    followed by pcp   (05-31-2020  per pt last exacerbation 11-09-2019 ED visit)   Obesity (BMI 30-39.9)    Pelvic adhesive disease    Pneumonia     Patient Active Problem List   Diagnosis Date Noted   Grief 07/08/2023   Bilateral knee pain 11/20/2022   Severe persistent asthma without complication 12/11/2021   Retroperitoneal fibrosis    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   Left ovarian cyst 04/27/2020   Obesity (BMI 30-39.9) 04/27/2020   Alcohol abuse 03/17/2020   Hepatic steatosis 06/26/2016   Tobacco abuse 06/26/2016   History of pulmonary embolism 10/26/2015   Marijuana use 10/26/2015   Healthcare maintenance 11/22/2014    Past Surgical History:  Procedure Laterality Date   ABDOMINAL HYSTERECTOMY     LAPAROSCOPIC OVARIAN CYSTECTOMY Left  06/05/2020   Procedure: LAPAROSCOPIC OVARIAN CYSTECTOMY;  Surgeon: Izell Harari, MD;  Location: [facility] [place];  Service: Gynecology;  Laterality: Left;   NO PAST SURGERIES     ROBOTIC ASSISTED TOTAL HYSTERECTOMY WITH BILATERAL SALPINGO OOPHERECTOMY N/A 07/03/2020   Procedure: XI ROBOTIC ASSISTED  LEFT  SALPINGO OOPHORECTOMY/ LAPAROSCOPIC LYSIS OF ADHESIONS/LEFT URETEROLYSIS;  Surgeon: Eloy Herring, MD;  Location: [facility];  Service: Gynecology;  Laterality: N/A;    OB History     Gravida  0   Para  0   Term  0   Preterm  0   AB  0   Living  0      SAB  0   IAB  0   Ectopic  0   Multiple  0   Live Births  0            Home Medications    Prior to Admission medications   Medication Sig Start Date End Date Taking? Authorizing Provider  albuterol  (VENTOLIN  HFA) 108 (90 Base) MCG/ACT inhaler Inhale 2 puffs into the lungs every 6 (six) hours as needed for wheezing or shortness of breath. 08/25/23   Gregary Sharper, MD  cyanocobalamin  1000 MCG tablet Take 1 tablet (1,000 mcg total) by mouth daily. 01/07/22   Susen Pastor, MD  diclofenac  Sodium (VOLTAREN ) 1 % GEL Apply 2 grams topically 4 (four) times daily. 08/25/23   Gregary,  Ozell, MD  folic acid  (FOLVITE ) 1 MG tablet Take 1 tablet (1 mg total) by mouth daily. 04/12/24 04/12/25  Edgardo Pontiff, DO  loratadine  (CLARITIN ) 10 MG tablet Take 1 tablet (10 mg total) by mouth daily. 01/07/22   Susen Pastor, MD  montelukast  (SINGULAIR ) 10 MG tablet Take 1 tablet (10 mg total) by mouth at bedtime. 06/04/23   Arellano Zameza, Priscila, MD  thiamine  (VITAMIN B1) 100 MG tablet Take 1 tablet (100 mg total) by mouth daily. 04/12/24   Edgardo Pontiff, DO    Family History Family History  Problem Relation Age of Onset   Other Other        denies family h/o clotting d/o   Asthma Maternal Grandfather    Hypertension Maternal Grandfather    Healthy Mother    Healthy Father     Social History Social History    Tobacco Use   Smoking status: Some Days    Current packs/day: 0.50    Average packs/day: 0.5 packs/day for 12.0 years (6.0 ttl pk-yrs)    Types: Cigarettes   Smokeless tobacco: Never   Tobacco comments:    12/11/21 states she has stopped smoking since going into the hospital  Vaping Use   Vaping status: Never Used  Substance Use Topics   Alcohol use: Yes    Alcohol/week: 0.0 standard drinks of alcohol   Drug use: Yes    Types: Marijuana     Allergies   Fish allergy   Review of Systems Review of Systems  Gastrointestinal:  Positive for abdominal pain.   Per HPI  Physical Exam Triage Vital Signs ED Triage Vitals  Encounter Vitals Group     BP 05/17/24 1940 (!) 123/96     Girls Systolic BP Percentile --      Girls Diastolic BP Percentile --      Boys Systolic BP Percentile --      Boys Diastolic BP Percentile --      Pulse Rate 05/17/24 1940 87     Resp 05/17/24 1940 16     Temp 05/17/24 1940 98.6 F (37 C)     Temp Source 05/17/24 1940 Oral     SpO2 05/17/24 1940 100 %     Weight --      Height --      Head Circumference --      Peak Flow --      Pain Score 05/17/24 1941 4     Pain Loc --      Pain Education --      Exclude from Growth Chart --    No data found.  Updated Vital Signs BP (!) 123/96 (BP Location: Right Arm)   Pulse 87   Temp 98.6 F (37 C) (Oral)   Resp 16   LMP 04/20/2024 (Approximate)   SpO2 100%   Visual Acuity Right Eye Distance:   Left Eye Distance:   Bilateral Distance:    Right Eye Near:   Left Eye Near:    Bilateral Near:     Physical Exam Vitals reviewed.  Constitutional:      General: She is awake. She is not in acute distress.    Appearance: Normal appearance. She is well-developed and well-groomed. She is not ill-appearing.  Cardiovascular:     Rate and Rhythm: Normal rate and regular rhythm.  Pulmonary:     Effort: Pulmonary effort is normal.     Breath sounds: Normal breath sounds.  Abdominal:      General: Abdomen  is flat. Bowel sounds are normal. There is no distension.     Palpations: Abdomen is soft. There is no mass.     Tenderness: There is abdominal tenderness in the right upper quadrant and right lower quadrant. There is guarding. There is no right CVA tenderness, left CVA tenderness or rebound. Positive signs include Murphy's sign and McBurney's sign. Negative signs include Rovsing's sign, psoas sign and obturator sign.     Hernia: No hernia is present.  Skin:    General: Skin is warm and dry.  Neurological:     Mental Status: She is alert.  Psychiatric:        Behavior: Behavior is cooperative.      UC Treatments / Results  Labs (all labs ordered are listed, but only abnormal results are displayed) Labs Reviewed  POCT URINALYSIS DIP (MANUAL ENTRY) - Abnormal; Notable for the following components:      Result Value   Ketones, POC UA trace (5) (*)    All other components within normal limits  POCT URINE PREGNANCY - Normal    EKG   Radiology No results found.  Procedures Procedures (including critical care time)  Medications Ordered in UC Medications - No data to display  Initial Impression / Assessment and Plan / UC Course  I have reviewed the triage vital signs and the nursing notes.  Pertinent labs & imaging results that were available during my care of the patient were reviewed by me and considered in my medical decision making (see chart for details).     Patient is overall well-appearing.  Vitals are stable.  Tenderness noted to right upper quadrant and right lower quadrant with guarding present.  Positive Murphy and McBurney's sign.  Negative Rovsing's, psoas, and obturator sign.  Urinalysis unremarkable, UPT negative.  Recommended patient be seen in the emergency department for further evaluation due to acute onset of right lower quadrant pain with tenderness.  Patient is understanding and agreeable to plan at this time.  Patient is stable to arrived  to the ER via POV. Final Clinical Impressions(s) / UC Diagnoses   Final diagnoses:  Right lower quadrant abdominal pain     Discharge Instructions      Please go to the emergency department for further evaluation of your right lower quadrant pain.     ED Prescriptions   None    PDMP not reviewed this encounter.   Johnie Flaming A, NP 05/17/24 2032

## 2024-05-17 NOTE — ED Provider Triage Note (Signed)
 Emergency Medicine Provider Triage Evaluation Note  RICKELL WIEHE , a 33 y.o. female  was evaluated in triage.  Pt complains of right lower quadrant pain that began 3 hours prior to presenting to urgent care, was referred to ED secondary to concerns for appendicitis.  She denies any nausea or vomiting, pain has been intermittent over the last several weeks, states that since leaving urgent care the pain is decreased to 4/10.  She does endorse difficulty initiating urination over the last several days as well.  Denies having any dysuria.  Urine obtained at urgent care was negative for pregnancy as well as negative for UTI.  Review of Systems  Positive: As above Negative:   Physical Exam  BP (!) 151/102 (BP Location: Right Arm)   Pulse 93   Temp 98.5 F (36.9 C)   Resp 16   Ht 5' 2 (1.575 m)   Wt 68.2 kg   LMP 04/20/2024 (Approximate)   SpO2 98%   BMI 27.50 kg/m  Gen:   Awake, no distress   Resp:  Normal effort  MSK:   Moves extremities without difficulty  Other:  Abdomen does show point tenderness to the right lower quadrant, negative Rovsing sign, negative psoas or obturator.  Medical Decision Making  Medically screening exam initiated at 9:14 PM.  Appropriate orders placed.  Mattison Golay Wright-Madkins was informed that the remainder of the evaluation will be completed by another provider, this initial triage assessment does not replace that evaluation, and the importance of remaining in the ED until their evaluation is complete.  Secondary to right lower quadrant pain, obtain CT imaging of the abdomen as well as abdominal pain workup.   Myriam Dorn BROCKS, [place] 05/17/24 2116

## 2024-05-24 ENCOUNTER — Other Ambulatory Visit (HOSPITAL_COMMUNITY): Payer: Self-pay

## 2024-06-13 ENCOUNTER — Encounter: Payer: Self-pay | Admitting: Gastroenterology

## 2024-06-15 ENCOUNTER — Emergency Department (HOSPITAL_COMMUNITY)
Admission: EM | Admit: 2024-06-15 | Discharge: 2024-06-16 | Discharge status: Against Medical Advice | Attending: Emergency Medicine | Admitting: Emergency Medicine

## 2024-06-15 ENCOUNTER — Encounter (HOSPITAL_COMMUNITY): Payer: Self-pay | Admitting: *Deleted

## 2024-06-15 ENCOUNTER — Other Ambulatory Visit: Payer: Self-pay

## 2024-06-15 DIAGNOSIS — Z5321 Procedure and treatment not carried out due to patient leaving prior to being seen by health care provider: Secondary | ICD-10-CM | POA: Insufficient documentation

## 2024-06-15 DIAGNOSIS — R1031 Right lower quadrant pain: Secondary | ICD-10-CM | POA: Diagnosis present

## 2024-06-15 LAB — CBC
HCT: 41.3 % (ref 36.0–46.0)
Hemoglobin: 14.4 g/dL (ref 12.0–15.0)
MCH: 34.5 pg — ABNORMAL HIGH (ref 26.0–34.0)
MCHC: 34.9 g/dL (ref 30.0–36.0)
MCV: 99 fL (ref 80.0–100.0)
Platelets: 308 K/uL (ref 150–400)
RBC: 4.17 MIL/uL (ref 3.87–5.11)
RDW: 14.6 % (ref 11.5–15.5)
WBC: 3.9 K/uL — ABNORMAL LOW (ref 4.0–10.5)
nRBC: 0 % (ref 0.0–0.2)

## 2024-06-15 LAB — COMPREHENSIVE METABOLIC PANEL WITH GFR
ALT: 49 U/L — ABNORMAL HIGH (ref 0–44)
AST: 189 U/L — ABNORMAL HIGH (ref 15–41)
Albumin: 3.9 g/dL (ref 3.5–5.0)
Alkaline Phosphatase: 94 U/L (ref 38–126)
Anion gap: 14 (ref 5–15)
BUN: 6 mg/dL (ref 6–20)
CO2: 25 mmol/L (ref 22–32)
Calcium: 9 mg/dL (ref 8.9–10.3)
Chloride: 103 mmol/L (ref 98–111)
Creatinine, Ser: 0.82 mg/dL (ref 0.44–1.00)
GFR, Estimated: >60 mL/min (ref >60)
Glucose, Bld: 81 mg/dL (ref 70–99)
Potassium: 3.7 mmol/L (ref 3.5–5.1)
Sodium: 142 mmol/L (ref 135–145)
Total Bilirubin: 0.8 mg/dL (ref 0.0–1.2)
Total Protein: 6.8 g/dL (ref 6.5–8.1)

## 2024-06-15 LAB — LIPASE, BLOOD: Lipase: 28 U/L (ref 11–51)

## 2024-06-15 NOTE — ED Notes (Signed)
 Pt. Decided to check out. Removing off the floor.

## 2024-07-05 ENCOUNTER — Other Ambulatory Visit

## 2024-07-05 ENCOUNTER — Encounter: Payer: Self-pay | Admitting: Gastroenterology

## 2024-07-05 ENCOUNTER — Ambulatory Visit: Admitting: Gastroenterology

## 2024-07-05 ENCOUNTER — Other Ambulatory Visit (HOSPITAL_COMMUNITY): Payer: Self-pay

## 2024-07-05 ENCOUNTER — Other Ambulatory Visit: Payer: Self-pay

## 2024-07-05 VITALS — BP 124/82 | HR 95 | Ht 62.0 in | Wt 144.5 lb

## 2024-07-05 DIAGNOSIS — K760 Fatty (change of) liver, not elsewhere classified: Secondary | ICD-10-CM | POA: Diagnosis not present

## 2024-07-05 DIAGNOSIS — R7989 Other specified abnormal findings of blood chemistry: Secondary | ICD-10-CM

## 2024-07-05 DIAGNOSIS — R1013 Epigastric pain: Secondary | ICD-10-CM | POA: Diagnosis not present

## 2024-07-05 LAB — CBC WITH DIFFERENTIAL/PLATELET
Basophils Absolute: 0.1 K/uL (ref 0.0–0.1)
Basophils Relative: 2.9 % (ref 0.0–3.0)
Eosinophils Absolute: 0.1 K/uL (ref 0.0–0.7)
Eosinophils Relative: 2.3 % (ref 0.0–5.0)
HCT: 39.6 % (ref 36.0–46.0)
Hemoglobin: 13.3 g/dL (ref 12.0–15.0)
Lymphocytes Relative: 38.6 % (ref 12.0–46.0)
Lymphs Abs: 0.9 K/uL (ref 0.7–4.0)
MCHC: 33.6 g/dL (ref 30.0–36.0)
MCV: 103.9 fl — ABNORMAL HIGH (ref 78.0–100.0)
Monocytes Absolute: 0.3 K/uL (ref 0.1–1.0)
Monocytes Relative: 11.4 % (ref 3.0–12.0)
Neutro Abs: 1.1 K/uL — ABNORMAL LOW (ref 1.4–7.7)
Neutrophils Relative %: 44.8 % (ref 43.0–77.0)
Platelets: 260 K/uL (ref 150.0–400.0)
RBC: 3.81 Mil/uL — ABNORMAL LOW (ref 3.87–5.11)
RDW: 15.2 % (ref 11.5–15.5)
WBC: 2.4 K/uL — ABNORMAL LOW (ref 4.0–10.5)

## 2024-07-05 LAB — COMPREHENSIVE METABOLIC PANEL WITH GFR
ALT: 37 U/L — ABNORMAL HIGH (ref 0–35)
AST: 118 U/L — ABNORMAL HIGH (ref 0–37)
Albumin: 4.5 g/dL (ref 3.5–5.2)
Alkaline Phosphatase: 88 U/L (ref 39–117)
BUN: 5 mg/dL — ABNORMAL LOW (ref 6–23)
CO2: 25 meq/L (ref 19–32)
Calcium: 9.2 mg/dL (ref 8.4–10.5)
Chloride: 100 meq/L (ref 96–112)
Creatinine, Ser: 0.66 mg/dL (ref 0.40–1.20)
GFR: 114.53 mL/min (ref >60.00)
Glucose, Bld: 76 mg/dL (ref 70–99)
Potassium: 3.9 meq/L (ref 3.5–5.1)
Sodium: 140 meq/L (ref 135–145)
Total Bilirubin: 2.3 mg/dL — ABNORMAL HIGH (ref 0.2–1.2)
Total Protein: 6.9 g/dL (ref 6.0–8.3)

## 2024-07-05 LAB — IBC + FERRITIN
Ferritin: 178.7 ng/mL (ref 10.0–291.0)
Iron: 262 ug/dL — ABNORMAL HIGH (ref 42–145)
Saturation Ratios: 95.5 % — ABNORMAL HIGH (ref 20.0–50.0)
TIBC: 274.4 ug/dL (ref 250.0–450.0)
Transferrin: 196 mg/dL — ABNORMAL LOW (ref 212.0–360.0)

## 2024-07-05 LAB — TSH: TSH: 1.76 u[IU]/mL (ref 0.35–5.50)

## 2024-07-05 LAB — GAMMA GT: GGT: 261 U/L — ABNORMAL HIGH (ref 7–51)

## 2024-07-05 LAB — PROTIME-INR
INR: 1 ratio (ref 0.8–1.0)
Prothrombin Time: 10.9 s (ref 9.6–13.1)

## 2024-07-05 NOTE — Progress Notes (Signed)
 Chief Complaint: Elevated LFTs Primary GI FI:[id number]  HPI: Discussed the use of AI scribe software for clinical note transcription with the patient, who gave verbal consent to proceed.  33 year old female presents for evaluation of elevated LFTs  Approximately two months ago, she was evaluated for stomach pain, during which an ultrasound revealed fatty liver. Her liver enzymes were elevated, with an AST of 189 and ALT of 49. No current abdominal pain, nausea, vomiting, or changes in bowel habits.  She has a significant history of daily alcohol consumption, specifically liquor, with an intake of about five or more shots per day for several years.   She reports occasional epigastric pain, sometimes worsened by eating, but not by alcohol. She is currently taking an over-the-counter acid pill, 20 mg, but not consistently. When she does take this medicine it is helpful.  Family history is notable for possible liver cancer in an uncle.  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 ;  completed xarelto  treated 05/ 2015   History of trichomoniasis    Left ovarian cyst    Mild intermittent asthma    followed by pcp   (05-31-2020  per pt last exacerbation 11-09-2019 ED visit)   Obesity (BMI 30-39.9)    Pelvic adhesive disease    Pneumonia     Past Surgical History:  Procedure Laterality Date   ABDOMINAL HYSTERECTOMY     LAPAROSCOPIC OVARIAN CYSTECTOMY Left 06/05/2020   Procedure: LAPAROSCOPIC OVARIAN CYSTECTOMY;  Surgeon: Izell Harari, MD;  Location: [facility];  Service: Gynecology;  Laterality: Left;   NO PAST SURGERIES     ROBOTIC ASSISTED TOTAL HYSTERECTOMY WITH BILATERAL SALPINGO OOPHERECTOMY N/A 07/03/2020   Procedure: XI ROBOTIC ASSISTED  LEFT  SALPINGO  OOPHORECTOMY/ LAPAROSCOPIC LYSIS OF ADHESIONS/LEFT URETEROLYSIS;  Surgeon: Eloy Herring, MD;  Location: [facility];  Service: Gynecology;  Laterality: N/A;    Current Outpatient Medications  Medication Sig Dispense Refill   albuterol  (VENTOLIN  HFA) 108 (90 Base) MCG/ACT inhaler Inhale 2 puffs into the lungs every 6 (six) hours as needed for wheezing or shortness of breath. 6.7 g 3   cyanocobalamin  1000 MCG tablet Take 1 tablet (1,000 mcg total) by mouth daily. (Patient not taking: Reported on 07/05/2024) 30 tablet 2   diclofenac  Sodium (VOLTAREN ) 1 % GEL Apply 2 grams topically 4 (four) times daily. 100 g 0   folic acid  (FOLVITE ) 1 MG tablet Take 1 tablet (1 mg total) by mouth daily. 90 tablet 2   loratadine  (CLARITIN ) 10 MG tablet Take 1 tablet (10 mg total) by mouth daily. 30 tablet 2   montelukast  (SINGULAIR ) 10 MG tablet Take 1 tablet (10 mg total) by mouth at bedtime. (Patient not taking: Reported on 07/05/2024) 30 tablet 2   thiamine  (VITAMIN B1) 100 MG tablet Take 1 tablet (100 mg total) by mouth daily. (Patient not taking: Reported on 07/05/2024) 90 tablet 2   Current Facility-Administered Medications  Medication Dose Route Frequency Provider Last Rate Last Admin   fluticasone  furoate-vilanterol (BREO ELLIPTA ) 100-25 MCG/ACT 1 puff  1 puff Inhalation PRN         Allergies as of 07/05/2024 - Review Complete 07/05/2024  Allergen Reaction Noted   Fish allergy Hives, Shortness Of Breath, and Swelling  01/10/2018    Family History  Problem Relation Age of Onset   Other Other        denies family h/o clotting d/o   Asthma Maternal Grandfather    Hypertension Maternal Grandfather    Healthy Mother    Healthy Father     Social History   Socioeconomic History   Marital status: Single    Spouse name: Not on file   Number of children: Not on file   Years of education: Not on file   Highest education level: Not on file  Occupational History   Not on file  Tobacco Use   Smoking  status: Some Days    Current packs/day: 0.50    Average packs/day: 0.5 packs/day for 12.0 years (6.0 ttl pk-yrs)    Types: Cigarettes   Smokeless tobacco: Never   Tobacco comments:    12/11/21 states she has stopped smoking since going into the hospital  Vaping Use   Vaping status: Never Used  Substance and Sexual Activity   Alcohol use: Yes    Alcohol/week: 5.0 standard drinks of alcohol    Types: 5 Shots of liquor per week    Comment: daily   Drug use: Yes    Types: Marijuana   Sexual activity: Yes    Birth control/protection: None  Other Topics Concern   Not on file  Social History Narrative   Not on file   Social Drivers of Health   Financial Resource Strain: Low Risk  (04/12/2024)   Overall Financial Resource Strain (CARDIA)    Difficulty of Paying Living Expenses: Not hard at all  Food Insecurity: No Food Insecurity (04/12/2024)   Hunger Vital Sign    Worried About Running Out of Food in the Last Year: Never true    Ran Out of Food in the Last Year: Never true  Transportation Needs: No Transportation Needs (08/20/2020)   PRAPARE - Administrator, Civil Service (Medical): No    Lack of Transportation (Non-Medical): No  Physical Activity: Sufficiently Active (04/12/2024)   Exercise Vital Sign    Days of Exercise per Week: 5 days    Minutes of Exercise per Session: 150+ min  Stress: No Stress Concern Present (04/12/2024)   [employer] of Occupational Health - Occupational Stress Questionnaire    Feeling of Stress: Only a little  Social Connections: Moderately Integrated (04/12/2024)   Social Connection and Isolation Panel    Frequency of Communication with Friends and Family: More than three times a week    Frequency of Social Gatherings with Friends and Family: More than three times a week    Attends Religious Services: 1 to 4 times per year    Active Member of [employer] or Organizations: No    Attends Banker Meetings: Never    Marital  Status: Living with partner  Intimate Partner Violence: Not At Risk (04/12/2024)   Humiliation, Afraid, Rape, and Kick questionnaire    Fear of Current or Ex-Partner: No    Emotionally Abused: No    Physically Abused: No    Sexually Abused: No    Review of Systems:    Constitutional: No weight loss, fever, chills, weakness or fatigue HEENT: Eyes: No change in vision               Ears, Nose, Throat:  No change in hearing or congestion Skin: No rash or itching Cardiovascular: No chest pain, chest pressure or palpitations   Respiratory: No SOB or cough  Gastrointestinal: See HPI and otherwise negative Genitourinary: No dysuria or change in urinary frequency Neurological: No headache, dizziness or syncope Musculoskeletal: No new muscle or joint pain Hematologic: No bleeding or bruising Psychiatric: No history of depression or anxiety    Physical Exam:  Vital signs: BP 124/82 (BP Location: Right Arm, Patient Position: Sitting, Cuff Size: Normal)   Pulse 95   Ht 5' 2 (1.575 m)   Wt 144 lb 8 oz (65.5 kg)   LMP 05/24/2024 (Approximate)   SpO2 94%   BMI 26.43 kg/m   Constitutional: NAD, alert and cooperative Head:  Normocephalic and atraumatic. Eyes:   PEERL, EOMI. No icterus. Conjunctiva pink. Respiratory: Respirations even and unlabored. Lungs clear to auscultation bilaterally.   No wheezes, crackles, or rhonchi.  Cardiovascular:  Regular rate and rhythm. No peripheral edema, cyanosis or pallor.  Gastrointestinal:  Soft, nondistended, nontender. No rebound or guarding. Normal bowel sounds. No appreciable masses or hepatomegaly. Rectal:  Declines Msk:  Symmetrical without gross deformities. Without edema, no deformity or joint abnormality.  Neurologic:  Alert and  oriented x4;  grossly normal neurologically.  Skin:   Dry and intact without significant lesions or rashes. Psychiatric: Oriented to person, place and time. Demonstrates good judgement and reason without abnormal affect  or behaviors.   RELEVANT LABS AND IMAGING: CBC    Component Value Date/Time   WBC 3.9 (L) 06/15/2024 2120   RBC 4.17 06/15/2024 2120   HGB 14.4 06/15/2024 2120   HGB 13.3 04/12/2024 1207   HCT 41.3 06/15/2024 2120   HCT 39.4 04/12/2024 1207   PLT 308 06/15/2024 2120   PLT 284 04/12/2024 1207   MCV 99.0 06/15/2024 2120   MCV 102 (H) 04/12/2024 1207   MCH 34.5 (H) 06/15/2024 2120   MCHC 34.9 06/15/2024 2120   RDW 14.6 06/15/2024 2120   RDW 13.3 04/12/2024 1207   LYMPHSABS 1.8 05/17/2024 2123   MONOABS 0.4 05/17/2024 2123   EOSABS 0.0 05/17/2024 2123   BASOSABS 0.0 05/17/2024 2123    CMP     Component Value Date/Time   NA 142 06/15/2024 2120   NA 139 04/12/2024 1207   K 3.7 06/15/2024 2120   CL 103 06/15/2024 2120   CO2 25 06/15/2024 2120   GLUCOSE 81 06/15/2024 2120   BUN 6 06/15/2024 2120   BUN 6 04/12/2024 1207   CREATININE 0.82 06/15/2024 2120   CALCIUM 9.0 06/15/2024 2120   PROT 6.8 06/15/2024 2120   PROT 6.7 04/12/2024 1207   ALBUMIN 3.9 06/15/2024 2120   ALBUMIN 4.3 04/12/2024 1207   AST 189 (H) 06/15/2024 2120   ALT 49 (H) 06/15/2024 2120   ALKPHOS 94 06/15/2024 2120   BILITOT 0.8 06/15/2024 2120   BILITOT 0.5 04/12/2024 1207   GFRNONAA >60 06/15/2024 2120   GFRAA >60 06/01/2020 1103     Assessment/Plan:    Elevated LFTs Elevated LFTs with AST predominant (AST 189/ALT 49, alk phos and total bilirubin normal).  RUQ ultrasound 04/2024 with hepatic steatosis.  Suspect alcohol use driving elevated LFTs with AST being predominant.  Rule out other etiologies of elevation. -- educated patient on importance of complete cessation of alcohol use and provided information on programs to help her stop if she is interested. She feels she can wean this on her own - serologic evaluation to rule out viral/autoimmune/genetic causes of liver disease - Ultrasound/elastography - Repeat CBC, CMP; obtain baseline INR, GGT - Counseled patient extensively on alcohol use and  the effects on the liver,  risks of cirrhosis; offered patient referrals for alcohol cessation programs or for medical therapy to help reduce cravings  Epigastric pain Occasional intermittent epigastric pain worse with eating well-controlled when she takes her acid medicine daily.  Suspect reflux.  Offered EGD which patient politely declined. - Educated patient on lifestyle modifications and provided patient education handouts - Recommend cessation of alcohol - Recommend adherence to antacid daily - If persistent symptoms recommend EGD  Assigned to Dr. Leigh today (Thursday)  Kara Haynes [facility] 07/05/2024, 12:58 PM  Cc: Edgardo Pontiff, DO

## 2024-07-05 NOTE — Progress Notes (Signed)
 Agree with assessment and plan as outlined. Enzyme elevation very likely related to alcohol use and she should completely stop as best she can.

## 2024-07-05 NOTE — Patient Instructions (Signed)
 Your provider has requested that you go to the basement level for lab work before leaving today. Press B on the elevator. The lab is located at the first door on the left as you exit the elevator.  You have been scheduled for an abdominal ultrasound with elastography at [facility] (1st floor). Your appointment is scheduled for 07/14/24 at 8:30. Please arrive 30 minutes prior to your scheduled appointment for registration purposes. Make certain not to have anything to eat or drink 6 hours prior to your procedure. Should you need to reschedule your appointment, you may contact radiology at [phone].  Liver Elastography Various chronic liver diseases such as hepatitis B, C, and fatty liver disease can lead to tissue damage and subsequent scar tissue formation. As the scar tissue accumulates, the liver loses some of its elasticity and becomes stiffer. Liver elastography involves the use of a surface ultrasound probe that delivers a low frequency pulse or shear wave to a small volume of liver tissue under the rib cage. The transmission of the sound wave is completely painless. How Is a Liver Elastography Performed? The liver is located in the right upper abdomen under the rib cage. Patients are asked to lie flat on an examination table. A technician places the FibroScan probe between the ribs on the right side of the lower chest wall. A series of 10 painless pulses are then applied to the liver. The results are recorded on the equipment and an overall liver stiffness score is generated. This score is then interpreted by a qualified physician to predict the likelihood of advanced fibrosis or cirrhosis.  Patients are asked to wear loose clothing and should not consume any liquids or solids for a minimum of 4 hours before the test to increase the likelihood of obtaining reliable test results. The scan will take 10 to 15 minutes to complete, but patients should plan on being available for 30 minutes  to allow time for preparation  _______________________________________________________  If your blood pressure at your visit was 140/90 or greater, please contact your primary care physician to follow up on this.  _______________________________________________________  If you are age 55 or older, your body mass index should be between 23-30. Your Body mass index is 26.43 kg/m. If this is out of the aforementioned range listed, please consider follow up with your Primary Care Provider.  If you are age 93 or younger, your body mass index should be between 19-25. Your Body mass index is 26.43 kg/m. If this is out of the aformentioned range listed, please consider follow up with your Primary Care Provider.   ________________________________________________________  The [facility] providers would like to encourage you to use MYCHART to communicate with providers for non-urgent requests or questions.  Due to long hold times on the telephone, sending your provider a message by [facility] may be a faster and more efficient way to get a response.  Please allow 48 business hours for a response.  Please remember that this is for non-urgent requests.  _______________________________________________________  [facility] is using a team-based approach to care.  Your team is made up of your doctor and two to three APPS. Our APPS (Nurse Practitioners and Physician Assistants) work with your physician to ensure care continuity for you. They are fully qualified to address your health concerns and develop a treatment plan. They communicate directly with your gastroenterologist to care for you. Seeing the Advanced Practice Practitioners on your physician's team can help you by facilitating care more promptly,  often allowing for earlier appointments, access to diagnostic testing, procedures, and other specialty referrals.

## 2024-07-09 LAB — CERULOPLASMIN: Ceruloplasmin: 29 mg/dL (ref 14–48)

## 2024-07-09 LAB — ANTI-SMOOTH MUSCLE ANTIBODY, IGG: Actin (Smooth Muscle) Antibody (IGG): <20 U (ref <20)

## 2024-07-09 LAB — ALPHA-1-ANTITRYPSIN: A-1 Antitrypsin, Ser: 146 mg/dL (ref 83–199)

## 2024-07-09 LAB — HEPATITIS A ANTIBODY, TOTAL: Hepatitis A AB,Total: NONREACTIVE

## 2024-07-09 LAB — HEPATITIS B SURFACE ANTIGEN: Hepatitis B Surface Ag: NONREACTIVE

## 2024-07-09 LAB — IGG: IgG (Immunoglobin G), Serum: 1041 mg/dL (ref 600–1640)

## 2024-07-09 LAB — HEPATITIS B CORE ANTIBODY, TOTAL: Hep B Core Total Ab: NONREACTIVE

## 2024-07-09 LAB — HEPATITIS C ANTIBODY: Hepatitis C Ab: NONREACTIVE

## 2024-07-09 LAB — ANTI-DNA ANTIBODY, DOUBLE-STRANDED: ds DNA Ab: <1 [IU]/mL

## 2024-07-09 LAB — HEPATITIS B SURFACE ANTIBODY,QUALITATIVE: Hep B S Ab: REACTIVE — AB

## 2024-07-09 LAB — ANTI-NUCLEAR AB-TITER (ANA TITER)
ANA TITER: 1:40 {titer} — ABNORMAL HIGH
ANA Titer 1: 1:40 {titer} — ABNORMAL HIGH

## 2024-07-09 LAB — MITOCHONDRIAL ANTIBODIES: Mitochondrial M2 Ab, IgG: <=20 U (ref <=20.0)

## 2024-07-09 LAB — ANA: Anti Nuclear Antibody (ANA): POSITIVE — AB

## 2024-07-09 LAB — VITAMIN B1: Vitamin B1 (Thiamine): <6 nmol/L — ABNORMAL LOW (ref 8–30)

## 2024-07-11 ENCOUNTER — Ambulatory Visit: Payer: Self-pay | Admitting: Gastroenterology

## 2024-07-14 ENCOUNTER — Ambulatory Visit (HOSPITAL_COMMUNITY)
Admission: RE | Admit: 2024-07-14 | Discharge: 2024-07-14 | Disposition: A | Discharge status: Home / Self Care | Source: Ambulatory Visit | Attending: Gastroenterology | Admitting: Gastroenterology

## 2024-07-14 DIAGNOSIS — K760 Fatty (change of) liver, not elsewhere classified: Secondary | ICD-10-CM | POA: Diagnosis present

## 2024-07-14 DIAGNOSIS — R7989 Other specified abnormal findings of blood chemistry: Secondary | ICD-10-CM | POA: Diagnosis present

## 2024-08-26 ENCOUNTER — Ambulatory Visit: Payer: Self-pay

## 2024-08-31 ENCOUNTER — Other Ambulatory Visit: Payer: Self-pay

## 2024-08-31 ENCOUNTER — Other Ambulatory Visit (HOSPITAL_COMMUNITY): Payer: Self-pay

## 2024-08-31 MED ORDER — ALBUTEROL SULFATE HFA 108 (90 BASE) MCG/ACT IN AERS
2.0000 | INHALATION_SPRAY | Freq: Four times a day (QID) | RESPIRATORY_TRACT | 3 refills | Status: AC | PRN
  Filled 2024-08-31: qty 6.7, 25d supply, fill #0
  Filled 2024-09-30: qty 6.7, 25d supply, fill #1

## 2024-08-31 NOTE — Telephone Encounter (Signed)
 Medication sent to pharmacy

## 2024-09-30 ENCOUNTER — Other Ambulatory Visit (HOSPITAL_COMMUNITY): Payer: Self-pay
